# Patient Record
Sex: Female | Born: 1985 | Race: Black or African American | Hispanic: No | Marital: Single | Smoking: Current some day smoker
Health system: Southern US, Community
[De-identification: ages and names within clinical notes are randomized; demographics above are authoritative.]

## PROBLEM LIST (undated history)

## (undated) ENCOUNTER — Inpatient Hospital Stay (HOSPITAL_COMMUNITY): Payer: Self-pay

## (undated) DIAGNOSIS — D649 Anemia, unspecified: Secondary | ICD-10-CM

## (undated) DIAGNOSIS — O139 Gestational [pregnancy-induced] hypertension without significant proteinuria, unspecified trimester: Secondary | ICD-10-CM

## (undated) DIAGNOSIS — S93326A Dislocation of tarsometatarsal joint of unspecified foot, initial encounter: Secondary | ICD-10-CM

## (undated) DIAGNOSIS — G4730 Sleep apnea, unspecified: Secondary | ICD-10-CM

## (undated) DIAGNOSIS — I10 Essential (primary) hypertension: Secondary | ICD-10-CM

---
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
## 2007-03-15 ENCOUNTER — Emergency Department (HOSPITAL_COMMUNITY): Admission: EM | Admit: 2007-03-15 | Discharge: 2007-03-15 | Payer: Self-pay | Admitting: Emergency Medicine

## 2008-01-22 ENCOUNTER — Emergency Department (HOSPITAL_COMMUNITY): Admission: EM | Admit: 2008-01-22 | Discharge: 2008-01-22 | Payer: Self-pay | Admitting: Family Medicine

## 2008-05-12 ENCOUNTER — Inpatient Hospital Stay (HOSPITAL_COMMUNITY): Admission: AD | Admit: 2008-05-12 | Discharge: 2008-05-12 | Payer: Self-pay | Admitting: Obstetrics & Gynecology

## 2008-08-14 ENCOUNTER — Ambulatory Visit (HOSPITAL_COMMUNITY): Admission: RE | Admit: 2008-08-14 | Discharge: 2008-08-14 | Payer: Self-pay | Admitting: Obstetrics

## 2009-01-13 ENCOUNTER — Inpatient Hospital Stay (HOSPITAL_COMMUNITY): Admission: AD | Admit: 2009-01-13 | Discharge: 2009-01-16 | Payer: Self-pay | Admitting: Obstetrics

## 2010-11-02 ENCOUNTER — Emergency Department (HOSPITAL_COMMUNITY)
Admission: EM | Admit: 2010-11-02 | Discharge: 2010-11-02 | Payer: Self-pay | Source: Home / Self Care | Admitting: Family Medicine

## 2010-12-09 ENCOUNTER — Encounter: Payer: Self-pay | Admitting: Obstetrics & Gynecology

## 2011-02-27 LAB — CBC
HCT: 28.8 % — ABNORMAL LOW (ref 36.0–46.0)
Hemoglobin: 9.5 g/dL — ABNORMAL LOW (ref 12.0–15.0)
MCHC: 33 g/dL (ref 30.0–36.0)
MCV: 98.8 fL (ref 78.0–100.0)
RBC: 2.91 MIL/uL — ABNORMAL LOW (ref 3.87–5.11)
RDW: 13 % (ref 11.5–15.5)

## 2011-03-04 LAB — CBC
MCHC: 34.1 g/dL (ref 30.0–36.0)
MCV: 96.5 fL (ref 78.0–100.0)
Platelets: 197 10*3/uL (ref 150–400)
RDW: 13 % (ref 11.5–15.5)

## 2011-03-04 LAB — RPR: RPR Ser Ql: NONREACTIVE

## 2011-08-14 LAB — HCG, QUANTITATIVE, PREGNANCY: hCG, Beta Chain, Quant, S: 5242 — ABNORMAL HIGH

## 2011-08-14 LAB — URINALYSIS, ROUTINE W REFLEX MICROSCOPIC
Bilirubin Urine: NEGATIVE
Glucose, UA: NEGATIVE
Hgb urine dipstick: NEGATIVE
Protein, ur: NEGATIVE
Urobilinogen, UA: 1

## 2011-08-14 LAB — CBC
HCT: 36.1
MCV: 92.1
Platelets: 288
RBC: 3.91
WBC: 11.6 — ABNORMAL HIGH

## 2011-08-14 LAB — WET PREP, GENITAL
Clue Cells Wet Prep HPF POC: NONE SEEN
Trich, Wet Prep: NONE SEEN
Yeast Wet Prep HPF POC: NONE SEEN

## 2011-08-14 LAB — GC/CHLAMYDIA PROBE AMP, GENITAL
Chlamydia, DNA Probe: NEGATIVE
GC Probe Amp, Genital: NEGATIVE

## 2011-08-14 LAB — URINE MICROSCOPIC-ADD ON

## 2011-08-14 LAB — POCT PREGNANCY, URINE: Preg Test, Ur: POSITIVE

## 2011-12-27 ENCOUNTER — Emergency Department (INDEPENDENT_AMBULATORY_CARE_PROVIDER_SITE_OTHER)
Admission: EM | Admit: 2011-12-27 | Discharge: 2011-12-27 | Disposition: A | Discharge status: Home / Self Care | Payer: Medicaid Other | Source: Home / Self Care | Attending: Emergency Medicine | Admitting: Emergency Medicine

## 2011-12-27 ENCOUNTER — Encounter (HOSPITAL_COMMUNITY): Payer: Self-pay | Admitting: *Deleted

## 2011-12-27 DIAGNOSIS — N390 Urinary tract infection, site not specified: Secondary | ICD-10-CM

## 2011-12-27 DIAGNOSIS — M549 Dorsalgia, unspecified: Secondary | ICD-10-CM

## 2011-12-27 LAB — POCT URINALYSIS DIP (DEVICE)
Ketones, ur: NEGATIVE mg/dL
Protein, ur: NEGATIVE mg/dL
Specific Gravity, Urine: 1.02 (ref 1.005–1.030)
Urobilinogen, UA: 0.2 mg/dL (ref 0.0–1.0)
pH: 7 (ref 5.0–8.0)

## 2011-12-27 MED ORDER — METHOCARBAMOL 500 MG PO TABS: 500.0000 mg | ORAL_TABLET | Freq: Four times a day (QID) | ORAL | Status: AC

## 2011-12-27 MED ORDER — SULFAMETHOXAZOLE-TRIMETHOPRIM 800-160 MG PO TABS: 1.0000 | ORAL_TABLET | Freq: Two times a day (BID) | ORAL | Status: AC

## 2011-12-27 MED ORDER — METHOCARBAMOL 500 MG PO TABS: 500.0000 mg | ORAL_TABLET | Freq: Two times a day (BID) | ORAL | Status: AC

## 2011-12-27 MED ORDER — IBUPROFEN 800 MG PO TABS: 800.0000 mg | ORAL_TABLET | Freq: Three times a day (TID) | ORAL | Status: AC | PRN

## 2011-12-27 NOTE — ED Notes (Signed)
jpt with onset of low back pain x one week - worse with movement - no comfortable position not sitting or standing - radiates across lower back - per pt last menstrual cycle oct 2012 - not sexually active since birth daughter 3 years ago

## 2011-12-27 NOTE — ED Provider Notes (Signed)
History     CSN: [id number]  Arrival date & time 12/27/11  1042   First MD Initiated Contact with Patient 12/27/11 1216      Chief Complaint  Patient presents with  . Back Pain    (Consider location/radiation/quality/duration/timing/severity/associated sxs/prior treatment) HPI Comments: Patient reports bilateral lower thoracic and lumbar pain right worse than left, starting  5 days ago. Patient states pain was  initially waxing and waning, and is now constant. Patient states she's unable to find any comfortable position.pain is worse with bending forward, going from lying to sitting, and torso rotation. New recent or remote history of trauma to the back. Does not recall any recent change in physical activity. No urinary urgency, frequency, hematuria, abdominal pain, vaginal complaints. No rash. No chest pain coughing wheezing, shortness of breath. Last bowel movement today, was WNL for patient. Is taking 800 mg of ibuprofen without relief. Last dose approximately 4 hours ago. No personal or family history of kidney stones. No history of cancer, prolonged steroid use, IVDU.   ROS as noted in HPI. All other ROS negative.   Patient is a 26 y.o. female presenting with back pain. The history is provided by the patient. No language interpreter was used.  Back Pain  This is a new problem. The current episode started more than 2 days ago. The problem occurs constantly. The problem has been gradually worsening. The pain is associated with no known injury. The pain is present in the thoracic spine and lumbar spine. The quality of the pain is described as aching. The pain does not radiate. The symptoms are aggravated by bending, twisting and certain positions. The pain is the same all the time. Pertinent negatives include no chest pain, no fever, no numbness, no weight loss, no abdominal pain, no bowel incontinence, no perianal numbness, no bladder incontinence, no dysuria, no pelvic pain, no leg pain, no  paresthesias, no paresis and no weakness. She has tried NSAIDs for the symptoms. The treatment provided no relief. Risk factors include obesity.    History reviewed. No pertinent past medical history.  History reviewed. No pertinent past surgical history.  Family History  Problem Relation Age of Onset  . Hypertension Mother   . Stroke Mother   . Diabetes Other     History  Substance Use Topics  . Smoking status: Never Smoker   . Smokeless tobacco: Not on file  . Alcohol Use: Yes    OB History    Grav Para Term Preterm Abortions TAB SAB Ect Mult Living                  Review of Systems  Constitutional: Negative for fever and weight loss.  Cardiovascular: Negative for chest pain.  Gastrointestinal: Negative for abdominal pain and bowel incontinence.  Genitourinary: Negative for bladder incontinence, dysuria and pelvic pain.  Musculoskeletal: Positive for back pain.  Neurological: Negative for weakness, numbness and paresthesias.    Allergies  Review of patient's allergies indicates no known allergies.  Home Medications   Current Outpatient Rx  Name Route Sig Dispense Refill  . IBUPROFEN 800 MG PO TABS Oral Take 800 mg by mouth every 8 (eight) hours as needed.    . IBUPROFEN 800 MG PO TABS Oral Take 1 tablet (800 mg total) by mouth every 8 (eight) hours as needed for pain. 30 tablet 0  . METHOCARBAMOL 500 MG PO TABS Oral Take 1 tablet (500 mg total) by mouth 2 (two) times daily. 20 tablet 0  .  METHOCARBAMOL 500 MG PO TABS Oral Take 1 tablet (500 mg total) by mouth 4 (four) times daily. 40 tablet 0  . SULFAMETHOXAZOLE-TRIMETHOPRIM 800-160 MG PO TABS Oral Take 1 tablet by mouth 2 (two) times daily. 6 tablet 0    BP 128/85  Pulse 75  Temp(Src) 97.9 F (36.6 C) (Oral)  Resp 19  SpO2 97%  LMP 09/03/2011  Physical Exam  Nursing note and vitals reviewed. Constitutional: She is oriented to person, place, and time. She appears well-developed and well-nourished.    HENT:  Head: Normocephalic and atraumatic.  Eyes: Conjunctivae and EOM are normal.  Neck: Normal range of motion.  Cardiovascular: Normal rate, regular rhythm, normal heart sounds and intact distal pulses.   Pulmonary/Chest: Effort normal and breath sounds normal. She exhibits no tenderness.  Abdominal: Soft. Normal appearance and bowel sounds are normal. She exhibits no distension. There is no tenderness. There is no rebound and no guarding.  Musculoskeletal: Normal range of motion. She exhibits no edema and no tenderness.       Thoracic back: She exhibits tenderness.       Back:       Bilateral lower extremities nontender,  intact  PT pulses, No pain with int/ext rotation hips bilaterally. SLR neg bilaterally. Sensation baseline light touch bilaterally for Pt, DTR's symmetric and intact bilaterally KJ. Pain aggravated with hip flexion, leg extension. Motor symmetric bilateral 5/5 hip flexion, quadriceps, hamstrings, EHL, foot dorsiflexion, foot plantarflexion, gait somewhat antalgic but without apparent new ataxia.   Neurological: She is alert and oriented to person, place, and time.  Skin: Skin is warm and dry. No rash noted.  Psychiatric: She has a normal mood and affect. Her behavior is normal. Judgment and thought content normal.    ED Course  Procedures (including critical care time)  Labs Reviewed  POCT URINALYSIS DIP (DEVICE) - Abnormal; Notable for the following:    Hgb urine dipstick SMALL (*)    Leukocytes, UA SMALL (*) Biochemical Testing Only. Please order routine urinalysis from main lab if confirmatory testing is needed.   All other components within normal limits  POCT PREGNANCY, URINE   No results found.   1. Back pain   2. UTI (lower urinary tract infection)       MDM  Previous records reviewed, noncontributory. Checking UA to rule out UTI, nephrolithiasis. No bony tenderness, history of fevers. Lungs clear, satting well on room air, doubt pulmonary process  causing symptoms. Abdomen soft, benign. Doubt intra-abdominal cause  of symptoms.  Discussed results with patient. Will treat this as a UTI and back spasm. Will have her followup with her doctor in several days for a repeat UA. Patient understands when to return the ED.  Luiz Blare, MD 12/27/11 2117

## 2012-03-09 ENCOUNTER — Encounter (HOSPITAL_COMMUNITY): Payer: Self-pay | Admitting: *Deleted

## 2012-03-09 ENCOUNTER — Emergency Department (HOSPITAL_COMMUNITY): Payer: Medicaid Other

## 2012-03-09 ENCOUNTER — Emergency Department (HOSPITAL_COMMUNITY)
Admission: EM | Admit: 2012-03-09 | Discharge: 2012-03-09 | Disposition: A | Discharge status: Home / Self Care | Payer: Medicaid Other | Attending: Emergency Medicine | Admitting: Emergency Medicine

## 2012-03-09 DIAGNOSIS — K8020 Calculus of gallbladder without cholecystitis without obstruction: Secondary | ICD-10-CM | POA: Insufficient documentation

## 2012-03-09 DIAGNOSIS — J45909 Unspecified asthma, uncomplicated: Secondary | ICD-10-CM | POA: Insufficient documentation

## 2012-03-09 DIAGNOSIS — R109 Unspecified abdominal pain: Secondary | ICD-10-CM | POA: Insufficient documentation

## 2012-03-09 DIAGNOSIS — R112 Nausea with vomiting, unspecified: Secondary | ICD-10-CM | POA: Insufficient documentation

## 2012-03-09 HISTORY — DX: Anemia, unspecified: D64.9

## 2012-03-09 LAB — COMPREHENSIVE METABOLIC PANEL
ALT: 10 U/L (ref 0–35)
Alkaline Phosphatase: 104 U/L (ref 39–117)
BUN: 9 mg/dL (ref 6–23)
CO2: 25 mEq/L (ref 19–32)
Chloride: 106 mEq/L (ref 96–112)
GFR calc Af Amer: >90 mL/min (ref >90)
GFR calc non Af Amer: >90 mL/min (ref >90)
Glucose, Bld: 100 mg/dL — ABNORMAL HIGH (ref 70–99)
Potassium: 3.8 mEq/L (ref 3.5–5.1)
Sodium: 139 mEq/L (ref 135–145)
Total Bilirubin: 0.4 mg/dL (ref 0.3–1.2)

## 2012-03-09 LAB — DIFFERENTIAL
Eosinophils Absolute: 0.6 10*3/uL (ref 0.0–0.7)
Lymphocytes Relative: 21 % (ref 12–46)
Lymphs Abs: 1.7 10*3/uL (ref 0.7–4.0)
Monocytes Relative: 7 % (ref 3–12)
Neutrophils Relative %: 64 % (ref 43–77)

## 2012-03-09 LAB — URINALYSIS, ROUTINE W REFLEX MICROSCOPIC
Bilirubin Urine: NEGATIVE
Ketones, ur: NEGATIVE mg/dL
Nitrite: NEGATIVE
Protein, ur: NEGATIVE mg/dL
Specific Gravity, Urine: 1.01 (ref 1.005–1.030)
Urobilinogen, UA: 0.2 mg/dL (ref 0.0–1.0)

## 2012-03-09 LAB — CBC
HCT: 34.2 % — ABNORMAL LOW (ref 36.0–46.0)
Hemoglobin: 11.3 g/dL — ABNORMAL LOW (ref 12.0–15.0)
MCH: 28.4 pg (ref 26.0–34.0)
MCHC: 33 g/dL (ref 30.0–36.0)
RDW: 12.8 % (ref 11.5–15.5)

## 2012-03-09 LAB — URINE MICROSCOPIC-ADD ON

## 2012-03-09 LAB — LIPASE, BLOOD: Lipase: 13 U/L (ref 11–59)

## 2012-03-09 MED ORDER — ONDANSETRON 8 MG PO TBDP: 8.0000 mg | ORAL_TABLET | Freq: Three times a day (TID) | ORAL | Status: AC | PRN

## 2012-03-09 MED ORDER — HYDROCODONE-ACETAMINOPHEN 5-500 MG PO TABS: 1.0000 | ORAL_TABLET | Freq: Four times a day (QID) | ORAL | Status: AC | PRN

## 2012-03-09 MED ORDER — SODIUM CHLORIDE 0.9 % IV BOLUS (SEPSIS)
1000.0000 mL | Freq: Once | INTRAVENOUS | Status: AC
  Administered 2012-03-09: 1000 mL via INTRAVENOUS

## 2012-03-09 MED ORDER — ONDANSETRON HCL 4 MG/2ML IJ SOLN
4.0000 mg | Freq: Once | INTRAMUSCULAR | Status: AC
  Administered 2012-03-09: 4 mg via INTRAVENOUS
  Filled 2012-03-09: qty 2

## 2012-03-09 MED ORDER — MORPHINE SULFATE 4 MG/ML IJ SOLN
6.0000 mg | Freq: Once | INTRAMUSCULAR | Status: AC
  Administered 2012-03-09: 6 mg via INTRAVENOUS
  Filled 2012-03-09: qty 2

## 2012-03-09 NOTE — ED Notes (Signed)
Patient reports she has had abd pain intermittently for 3 years.  She states she has n/v with the pain.  She states it feels like she has a hole in her stomach.  Patient states she woke with worse sx today

## 2012-03-09 NOTE — Discharge Instructions (Signed)
Your ultrasound showed gallstones in your gallbladder. Avoid fatty foods. Take vicodin as prescribed as needed for severe pain. Take zofran for nausea. Follow up with [facility]. Call them, explain you were here, and you need an appointment as soon as possible if your symptoms continue. Return if worsening.    Cholelithiasis Cholelithiasis (also called gallstones) is a form of gallbladder disease where gallstones form in your gallbladder. The gallbladder is a non-essential organ that stores bile made in the liver, which helps digest fats. Gallstones begin as small crystals and slowly grow into stones. Gallstone pain occurs when the gallbladder spasms, and a gallstone is blocking the duct. Pain can also occur when a stone passes out of the duct.  Women are more likely to develop gallstones than men. Other factors that increase the risk of gallbladder disease are:  Having multiple pregnancies. Physicians sometimes advise removing diseased gallbladders before future pregnancies.   Obesity.   Diets heavy in fried foods and fat.   Increasing age (older than 8).   Prolonged use of medications containing female hormones.   Diabetes mellitus.   Rapid weight loss.   Family history of gallstones (heredity).  SYMPTOMS  Feeling sick to your stomach (nauseous).   Abdominal pain.   Yellowing of the skin (jaundice).   Sudden pain. It may persist from several minutes to several hours.   Worsening pain with deep breathing or when jarred.   Fever.   Tenderness to the touch.  In some cases, when gallstones do not move into the bile duct, people have no pain or symptoms. These are called "silent" gallstones. TREATMENT In severe cases, emergency surgery may be required. HOME CARE INSTRUCTIONS   Only take over-the-counter or prescription medicines for pain, discomfort, or fever as directed by your caregiver.   Follow a low-fat diet until seen again. Fat causes the gallbladder to  contract, which can result in pain.   Follow up as instructed. Attacks are almost always recurrent and surgery is usually required for permanent treatment.  SEEK IMMEDIATE MEDICAL CARE IF:   Your pain increases and is not controlled by medications.   You have an oral temperature above 102 F (38.9 C), not controlled by medication.   You develop nausea and vomiting.  MAKE SURE YOU:   Understand these instructions.   Will watch your condition.   Will get help right away if you are not doing well or get worse.  Document Released: 10/30/2005 Document Revised: 10/23/2011 Document Reviewed: 01/02/2011 [facility] Patient Information 2012 [place], [place].

## 2012-03-09 NOTE — ED Provider Notes (Signed)
History     CSN: [id number]  Arrival date & time 03/09/12  7829   First MD Initiated Contact with Patient 03/09/12 0935      Chief Complaint  Patient presents with  . Abdominal Pain    (Consider location/radiation/quality/duration/timing/severity/associated sxs/prior treatment) Patient is a 26 y.o. female presenting with abdominal pain. The history is provided by the patient.  Abdominal Pain The primary symptoms of the illness include abdominal pain, nausea and vomiting. The primary symptoms of the illness do not include fever, diarrhea, dysuria, vaginal discharge or vaginal bleeding. The current episode started more than 2 days ago. The problem has been gradually worsening.  The illness is associated with eating. The patient states that she believes she is currently not pregnant. The patient has not had a change in bowel habit. Symptoms associated with the illness do not include chills.  Pt states she has had intermittent abdominal pains for the last 3 years after she had a child. States they are now gradually worsened, and in the last two weeks have been more constant associated with nausea and vomiting. States she cannot keep anything down in the last few days, even water. Pt denies fever, chills, urinary symptoms, vaginal symptoms. States this time pain has been constant since 3am.  Past Medical History  Diagnosis Date  . Asthma   . Anemia     History reviewed. No pertinent past surgical history.  Family History  Problem Relation Age of Onset  . Hypertension Mother   . Stroke Mother   . Diabetes Other     History  Substance Use Topics  . Smoking status: Never Smoker   . Smokeless tobacco: Never Used  . Alcohol Use: Yes     socially    OB History    Grav Para Term Preterm Abortions TAB SAB Ect Mult Living                  Review of Systems  Constitutional: Negative for fever and chills.  HENT: Negative.   Respiratory: Negative.   Cardiovascular: Negative.     Gastrointestinal: Positive for nausea, vomiting and abdominal pain. Negative for diarrhea.  Genitourinary: Negative for dysuria, vaginal bleeding and vaginal discharge.  Musculoskeletal: Negative.   Skin: Negative.   Neurological: Negative for dizziness and weakness.    Allergies  Review of patient's allergies indicates no known allergies.  Home Medications   Current Outpatient Rx  Name Route Sig Dispense Refill  . CETIRIZINE HCL 10 MG PO TABS Oral Take 10 mg by mouth daily.      BP 115/77  Pulse 91  Temp(Src) 98.4 F (36.9 C) (Oral)  Resp 16  Ht 5\' 3"  (1.6 m)  Wt 222 lb (100.699 kg)  BMI 39.33 kg/m2  SpO2 98%  Physical Exam  Nursing note and vitals reviewed. Constitutional: She is oriented to person, place, and time. She appears well-developed and well-nourished. No distress.  HENT:  Head: Normocephalic.  Eyes: Conjunctivae are normal.  Neck: Neck supple.  Cardiovascular: Normal rate, regular rhythm and normal heart sounds.   Pulmonary/Chest: Effort normal and breath sounds normal. No respiratory distress.  Abdominal: Soft. Bowel sounds are normal. There is tenderness. There is no rebound and no guarding.       Epigastric and RUQ tenderness   Neurological: She is alert and oriented to person, place, and time.  Skin: Skin is warm and dry.  Psychiatric: She has a normal mood and affect.    ED Course  Procedures (including  critical care time)  Pt with RUQ and epigastirc pain on and off for about 3 years. She appears very uncomfortable right now. I have a high suspicion for possible cholelithiasis or cholecystitis based on exam and history. Will get labs, US abdomen.   Results for orders placed during the hospital encounter of 03/09/12  CBC      Component Value Range   WBC 8.1  4.0 - 10.5 (K/uL)   RBC 3.98  3.87 - 5.11 (MIL/uL)   Hemoglobin 11.3 (*) 12.0 - 15.0 (g/dL)   HCT 16.1 (*) 09.6 - 46.0 (%)   MCV 85.9  78.0 - 100.0 (fL)   MCH 28.4  26.0 - 34.0 (pg)    MCHC 33.0  30.0 - 36.0 (g/dL)   RDW 04.5  40.9 - 81.1 (%)   Platelets 279  150 - 400 (K/uL)  DIFFERENTIAL      Component Value Range   Neutrophils Relative 64  43 - 77 (%)   Neutro Abs 5.2  1.7 - 7.7 (K/uL)   Lymphocytes Relative 21  12 - 46 (%)   Lymphs Abs 1.7  0.7 - 4.0 (K/uL)   Monocytes Relative 7  3 - 12 (%)   Monocytes Absolute 0.6  0.1 - 1.0 (K/uL)   Eosinophils Relative 7 (*) 0 - 5 (%)   Eosinophils Absolute 0.6  0.0 - 0.7 (K/uL)   Basophils Relative 1  0 - 1 (%)   Basophils Absolute 0.1  0.0 - 0.1 (K/uL)  COMPREHENSIVE METABOLIC PANEL      Component Value Range   Sodium 139  135 - 145 (mEq/L)   Potassium 3.8  3.5 - 5.1 (mEq/L)   Chloride 106  96 - 112 (mEq/L)   CO2 25  19 - 32 (mEq/L)   Glucose, Bld 100 (*) 70 - 99 (mg/dL)   BUN 9  6 - 23 (mg/dL)   Creatinine, Ser 9.14  0.50 - 1.10 (mg/dL)   Calcium 9.1  8.4 - 78.2 (mg/dL)   Total Protein 7.0  6.0 - 8.3 (g/dL)   Albumin 3.7  3.5 - 5.2 (g/dL)   AST 13  0 - 37 (U/L)   ALT 10  0 - 35 (U/L)   Alkaline Phosphatase 104  39 - 117 (U/L)   Total Bilirubin 0.4  0.3 - 1.2 (mg/dL)   GFR calc non Af Amer >90  >90 (mL/min)   GFR calc Af Amer >90  >90 (mL/min)  LIPASE, BLOOD      Component Value Range   Lipase 13  11 - 59 (U/L)  URINALYSIS, ROUTINE W REFLEX MICROSCOPIC      Component Value Range   Color, Urine YELLOW  YELLOW    APPearance CLEAR  CLEAR    Specific Gravity, Urine 1.010  1.005 - 1.030    pH 6.5  5.0 - 8.0    Glucose, UA NEGATIVE  NEGATIVE (mg/dL)   Hgb urine dipstick SMALL (*) NEGATIVE    Bilirubin Urine NEGATIVE  NEGATIVE    Ketones, ur NEGATIVE  NEGATIVE (mg/dL)   Protein, ur NEGATIVE  NEGATIVE (mg/dL)   Urobilinogen, UA 0.2  0.0 - 1.0 (mg/dL)   Nitrite NEGATIVE  NEGATIVE    Leukocytes, UA TRACE (*) NEGATIVE   PREGNANCY, URINE      Component Value Range   Preg Test, Ur NEGATIVE  NEGATIVE   URINE MICROSCOPIC-ADD ON      Component Value Range   Squamous Epithelial / LPF RARE  RARE  WBC, UA 3-6  <3  (WBC/hpf)   RBC / HPF 0-2  <3 (RBC/hpf)   Bacteria, UA RARE  RARE    Urine-Other MUCOUS PRESENT     US Abdomen Complete  03/09/2012  *RADIOLOGY REPORT*  Clinical Data:  Abdominal pain  COMPLETE ABDOMINAL ULTRASOUND  Comparison:  None.  Findings:  Gallbladder:  Multiple shadowing gallstones are seen within an otherwise normal-appearing gallbladder. A shadowing gallstone is also seen within the gallbladder neck.  No gallbladder wall thickening or pericholecystic fluid.  Negative sonographic Murphy's sign.  Common bile duct:  Normal in size measuring 2.1 mm in diameter.  Liver:  Homogeneous hepatic echotexture.  No discrete hepatic lesions.  No definite intrahepatic biliary ductal dilatation.  No ascites.  IVC:  Appears normal.  Pancreas:  Limited visualization of the pancreatic head and neck is normal.  Visualization of the pancreatic body and tail is obscured by bowel gas.  Spleen:  Normal in size measuring 8 cm in length.  Right Kidney:  Normal cortical thickness, echogenicity and size, measuring 10.5 cm in length.  No focal renal lesions.  No echogenic renal stones.  No urinary obstruction.  Left Kidney:  Normal cortical thickness, echogenicity and size, measuring 11.9 cm in length.  No focal renal lesions.  No echogenic renal stones.  No urinary obstruction.  Abdominal aorta:  No aneurysm identified.  IMPRESSION: Multiple shadowing gallstones including a gallstone within the gallbladder neck, are seen within an otherwise normal-appearing gallbladder. These findings remain indeterminate for acute cholecystitis and further evaluation may be performed with a HIDA scan as clinically indicated.  Original Report Authenticated By: Waynard Reeds, M.D.   12:01 PM Pt is pain free, nausea resolved. US shows multiple gallstone including one within gallbladder neck. Pt's LFTs and lipase both negative, no elevated WBC. She is comfortable. Will d/c home with close surgery outpatient follow up. Instructed tto return if  fever, increased pain, persistent nausea and vomiting.   1. Cholelithiasis       MDM          Lottie Mussel, PA 03/09/12 1256

## 2012-03-12 NOTE — ED Provider Notes (Signed)
Medical screening examination/treatment/procedure(s) were performed by non-physician practitioner and as supervising physician I was immediately available for consultation/collaboration.  Bonnee Zertuche T Samuella Rasool, MD 03/12/12 1800 

## 2012-03-19 ENCOUNTER — Encounter (INDEPENDENT_AMBULATORY_CARE_PROVIDER_SITE_OTHER): Payer: Self-pay

## 2012-03-23 ENCOUNTER — Encounter (INDEPENDENT_AMBULATORY_CARE_PROVIDER_SITE_OTHER): Payer: Self-pay | Admitting: General Surgery

## 2012-03-23 ENCOUNTER — Ambulatory Visit (INDEPENDENT_AMBULATORY_CARE_PROVIDER_SITE_OTHER): Payer: Medicaid Other | Admitting: General Surgery

## 2012-03-23 VITALS — BP 110/80 | HR 86 | Temp 97.8°F | Ht 64.5 in | Wt 217.8 lb

## 2012-03-23 DIAGNOSIS — K8020 Calculus of gallbladder without cholecystitis without obstruction: Secondary | ICD-10-CM

## 2012-03-23 NOTE — Patient Instructions (Signed)
Strict lowfat diet. 

## 2012-03-23 NOTE — Progress Notes (Signed)
Patient ID: Shirley Terrell, female   DOB: 04/10/1986, 26 y.o.   MRN: [id number]  Chief Complaint  Patient presents with  . Pre-op Exam    eval gallbladder with stones    HPI Shirley Terrell is a 26 y.o. female.   HPI  She was referred by Dr. Williams for evaluation of epigastric pain and gallstones.  For about 3 years she's been having intermittent episodes of epigastric discomfort and some nausea.  Recently, her symptoms have progressed and included vomiting.  She was evaluated in the emergency department. An ultrasound demonstrated multiple gallstones with a normal common bile duct diameter. There is no evidence of cholecystitis. Liver function tests were normal. White blood cell count and lipase are normal. She has been referred here for further evaluation and treatment of her gallbladder disease.    Past Medical History  Diagnosis Date  . Asthma   . Anemia   . Gallstones     History reviewed. No pertinent past surgical history.  Family History  Problem Relation Age of Onset  . Hypertension Mother   . Stroke Mother   . Diabetes Other   . Heart disease Father   . Depression Father   . Cancer Paternal Grandmother     breast    Social History History  Substance Use Topics  . Smoking status: Never Smoker   . Smokeless tobacco: Never Used  . Alcohol Use: Yes     socially    No Known Allergies  Current Outpatient Prescriptions  Medication Sig Dispense Refill  . cetirizine (ZYRTEC) 10 MG tablet Take 10 mg by mouth daily.      . HYDROcodone-acetaminophen (VICODIN) 5-500 MG per tablet Take 1 tablet by mouth every 6 (six) hours as needed.      . ondansetron (ZOFRAN) 8 MG tablet Take 8 mg by mouth every 8 (eight) hours as needed.        Review of Systems Review of Systems  Constitutional: Negative.   Respiratory: Negative.   Cardiovascular: Negative.   Gastrointestinal: Positive for nausea, vomiting, abdominal pain, constipation and abdominal distention.    Genitourinary: Negative.   Hematological:       Anemia    Blood pressure 110/80, pulse 86, temperature 97.8 F (36.6 C), temperature source Temporal, height 5' 4.5" (1.638 m), weight 217 lb 12.8 oz (98.793 kg), SpO2 98.00%.  Physical Exam Physical Exam  Constitutional:       Obese female in NAD.  Eyes: EOM are normal. No scleral icterus.  Neck: Neck supple.  Cardiovascular: Normal rate and regular rhythm.   Pulmonary/Chest: Effort normal and breath sounds normal.  Abdominal: Soft. She exhibits no distension and no mass. There is Tenderness: mild RUQ and epigastric tenderness..  Lymphadenopathy:    She has no cervical adenopathy.  Skin: Skin is warm and dry.    Data [facility]ed [facility]  Assessment    Symptomatic cholelithiasis     Plan    I recommended a strict low-fat diet. I also recommend a laparoscopic cholecystectomy.  I have explained the procedure, risks, and aftercare of cholecystectomy.  Risks include but are not limited to bleeding, infection, wound problems, anesthesia, diarrhea, bile leak, injury to common bile duct/liver/intestine.  She seems to understand and agrees to proceed.        Chassity Ludke J 03/23/2012, 5:03 PM    

## 2012-04-01 ENCOUNTER — Encounter (HOSPITAL_COMMUNITY)
Admission: RE | Admit: 2012-04-01 | Discharge: 2012-04-01 | Disposition: A | Discharge status: Home / Self Care | Payer: Medicaid Other | Source: Ambulatory Visit | Attending: General Surgery | Admitting: General Surgery

## 2012-04-01 ENCOUNTER — Encounter (HOSPITAL_COMMUNITY): Payer: Self-pay

## 2012-04-01 LAB — CBC
Platelets: 304 10*3/uL (ref 150–400)
RDW: 12.7 % (ref 11.5–15.5)
WBC: 8.2 10*3/uL (ref 4.0–10.5)

## 2012-04-01 LAB — COMPREHENSIVE METABOLIC PANEL
ALT: 9 U/L (ref 0–35)
AST: 13 U/L (ref 0–37)
Albumin: 4 g/dL (ref 3.5–5.2)
Alkaline Phosphatase: 85 U/L (ref 39–117)
Chloride: 103 mEq/L (ref 96–112)
Potassium: 4.3 mEq/L (ref 3.5–5.1)
Total Bilirubin: 0.5 mg/dL (ref 0.3–1.2)

## 2012-04-01 LAB — SURGICAL PCR SCREEN: MRSA, PCR: NEGATIVE

## 2012-04-01 LAB — PROTIME-INR: INR: 0.96 (ref 0.00–1.49)

## 2012-04-01 NOTE — Progress Notes (Signed)
Has irregular menses

## 2012-04-01 NOTE — Pre-Procedure Instructions (Addendum)
22 CHIMAMANDA SIEGFRIED  04/01/2012   Your procedure is scheduled on:  04/13/12  Report to [facility] at 730 AM.  Call this number if you have problems the morning of surgery: [phone]   Remember:   Do not eat food:After Midnight.  May have clear liquids: up to 4 Hours before arrival 130 am.  Clear liquids include soda, tea, black coffee, apple or grape juice, broth.  Take these medicines the morning of surgery with A SIP OF WATER: pain med, zyrtec, zofran if needed STOP any aspirin, nsaids, herbal meds, blood thinners   Do not wear jewelry, make-up or nail polish.  Do not wear lotions, powders, or perfumes. You may wear deodorant.  Do not shave 48 hours prior to surgery. Men may shave face and neck.  Do not bring valuables to the hospital.  Contacts, dentures or bridgework may not be worn into surgery.  Leave suitcase in the car. After surgery it may be brought to your room.  For patients admitted to the hospital, checkout time is 11:00 AM the day of discharge.   Patients discharged the day of surgery will not be allowed to drive home.  Name and phone number of your [phone] mom  Special Instructions: CHG Shower Use Special Wash: 1/2 bottle night before surgery and 1/2 bottle morning of surgery.   Please read over the following fact sheets that you were given: Pain Booklet, Coughing and Deep Breathing, MRSA Information and Surgical Site Infection Prevention

## 2012-04-07 ENCOUNTER — Encounter (HOSPITAL_COMMUNITY): Payer: Self-pay | Admitting: Pharmacy Technician

## 2012-04-12 MED ORDER — CEFAZOLIN SODIUM-DEXTROSE 2-3 GM-% IV SOLR
2.0000 g | INTRAVENOUS | Status: AC
  Administered 2012-04-13: 2 g via INTRAVENOUS
  Filled 2012-04-12: qty 50

## 2012-04-13 ENCOUNTER — Encounter (HOSPITAL_COMMUNITY)
Admission: RE | Disposition: A | Discharge status: Home / Self Care | Payer: Self-pay | Source: Ambulatory Visit | Attending: General Surgery

## 2012-04-13 ENCOUNTER — Encounter (HOSPITAL_COMMUNITY): Payer: Self-pay | Admitting: Anesthesiology

## 2012-04-13 ENCOUNTER — Ambulatory Visit (HOSPITAL_COMMUNITY)
Admission: RE | Admit: 2012-04-13 | Discharge: 2012-04-13 | Disposition: A | Discharge status: Home / Self Care | Payer: Medicaid Other | Source: Ambulatory Visit | Attending: General Surgery | Admitting: General Surgery

## 2012-04-13 ENCOUNTER — Ambulatory Visit (HOSPITAL_COMMUNITY): Payer: Medicaid Other | Admitting: Anesthesiology

## 2012-04-13 ENCOUNTER — Ambulatory Visit (HOSPITAL_COMMUNITY): Payer: Medicaid Other

## 2012-04-13 DIAGNOSIS — J45909 Unspecified asthma, uncomplicated: Secondary | ICD-10-CM | POA: Insufficient documentation

## 2012-04-13 DIAGNOSIS — K8010 Calculus of gallbladder with chronic cholecystitis without obstruction: Secondary | ICD-10-CM

## 2012-04-13 DIAGNOSIS — K8020 Calculus of gallbladder without cholecystitis without obstruction: Secondary | ICD-10-CM | POA: Insufficient documentation

## 2012-04-13 DIAGNOSIS — Z01812 Encounter for preprocedural laboratory examination: Secondary | ICD-10-CM | POA: Insufficient documentation

## 2012-04-13 HISTORY — PX: CHOLECYSTECTOMY: SHX55

## 2012-04-13 SURGERY — LAPAROSCOPIC CHOLECYSTECTOMY WITH INTRAOPERATIVE CHOLANGIOGRAM
Anesthesia: General | Site: Abdomen | Wound class: Clean Contaminated

## 2012-04-13 MED ORDER — FENTANYL CITRATE 0.05 MG/ML IJ SOLN
INTRAMUSCULAR | Status: DC | PRN
  Administered 2012-04-13: 100 ug via INTRAVENOUS
  Administered 2012-04-13: 250 ug via INTRAVENOUS

## 2012-04-13 MED ORDER — ACETAMINOPHEN 10 MG/ML IV SOLN
INTRAVENOUS | Status: DC | PRN
  Administered 2012-04-13: 1000 mg via INTRAVENOUS

## 2012-04-13 MED ORDER — SODIUM CHLORIDE 0.9 % IR SOLN
Status: DC | PRN
  Administered 2012-04-13: 1000 mL

## 2012-04-13 MED ORDER — ACETAMINOPHEN 10 MG/ML IV SOLN
INTRAVENOUS | Status: AC
  Filled 2012-04-13: qty 100

## 2012-04-13 MED ORDER — GLYCOPYRROLATE 0.2 MG/ML IJ SOLN
INTRAMUSCULAR | Status: DC | PRN
  Administered 2012-04-13: .4 mg via INTRAVENOUS

## 2012-04-13 MED ORDER — HYDROMORPHONE HCL PF 1 MG/ML IJ SOLN
0.2500 mg | INTRAMUSCULAR | Status: DC | PRN
  Administered 2012-04-13: 0.5 mg via INTRAVENOUS

## 2012-04-13 MED ORDER — 0.9 % SODIUM CHLORIDE (POUR BTL) OPTIME
TOPICAL | Status: DC | PRN
  Administered 2012-04-13: 1000 mL

## 2012-04-13 MED ORDER — DEXAMETHASONE SODIUM PHOSPHATE 4 MG/ML IJ SOLN
INTRAMUSCULAR | Status: DC | PRN
  Administered 2012-04-13: 4 mg via INTRAVENOUS

## 2012-04-13 MED ORDER — LACTATED RINGERS IV SOLN
INTRAVENOUS | Status: DC
  Administered 2012-04-13: 09:00:00 via INTRAVENOUS
  Administered 2012-04-13: 1000 mL via INTRAVENOUS
  Administered 2012-04-13: 10:00:00 via INTRAVENOUS

## 2012-04-13 MED ORDER — OXYCODONE-ACETAMINOPHEN 5-325 MG PO TABS: 1.0000 | ORAL_TABLET | ORAL | Status: AC | PRN

## 2012-04-13 MED ORDER — PROPOFOL 10 MG/ML IV BOLUS
INTRAVENOUS | Status: DC | PRN
  Administered 2012-04-13: 200 mg via INTRAVENOUS

## 2012-04-13 MED ORDER — SODIUM CHLORIDE 0.9 % IV SOLN
INTRAVENOUS | Status: DC | PRN
  Administered 2012-04-13: 09:00:00

## 2012-04-13 MED ORDER — DROPERIDOL 2.5 MG/ML IJ SOLN
INTRAMUSCULAR | Status: DC | PRN
  Administered 2012-04-13: 0.625 mg via INTRAVENOUS

## 2012-04-13 MED ORDER — ONDANSETRON HCL 4 MG/2ML IJ SOLN
INTRAMUSCULAR | Status: DC | PRN
  Administered 2012-04-13: 4 mg via INTRAVENOUS

## 2012-04-13 MED ORDER — KETOROLAC TROMETHAMINE 30 MG/ML IJ SOLN
15.0000 mg | Freq: Once | INTRAMUSCULAR | Status: AC | PRN
  Administered 2012-04-13: 15 mg via INTRAVENOUS

## 2012-04-13 MED ORDER — VECURONIUM BROMIDE 10 MG IV SOLR
INTRAVENOUS | Status: DC | PRN
  Administered 2012-04-13: 5 mg via INTRAVENOUS

## 2012-04-13 MED ORDER — BUPIVACAINE-EPINEPHRINE 0.25% -1:200000 IJ SOLN
INTRAMUSCULAR | Status: DC | PRN
  Administered 2012-04-13: 16 mL

## 2012-04-13 MED ORDER — MIDAZOLAM HCL 5 MG/5ML IJ SOLN
INTRAMUSCULAR | Status: DC | PRN
  Administered 2012-04-13: 2 mg via INTRAVENOUS

## 2012-04-13 MED ORDER — NEOSTIGMINE METHYLSULFATE 1 MG/ML IJ SOLN
INTRAMUSCULAR | Status: DC | PRN
  Administered 2012-04-13: 3 mg via INTRAVENOUS

## 2012-04-13 MED ORDER — LIDOCAINE HCL (CARDIAC) 20 MG/ML IV SOLN
INTRAVENOUS | Status: DC | PRN
  Administered 2012-04-13: 100 mg via INTRAVENOUS

## 2012-04-13 MED ORDER — PROMETHAZINE HCL 25 MG/ML IJ SOLN: 6.2500 mg | INTRAMUSCULAR | Status: DC | PRN

## 2012-04-13 SURGICAL SUPPLY — 48 items
APL SKNCLS STERI-STRIP NONHPOA (GAUZE/BANDAGES/DRESSINGS) ×1
APPLIER CLIP 5 13 M/L LIGAMAX5 (MISCELLANEOUS) ×2
APR CLP MED LRG 5 ANG JAW (MISCELLANEOUS) ×1
BAG SPEC RTRVL LRG 6X4 10 (ENDOMECHANICALS) ×1
BENZOIN TINCTURE PRP APPL 2/3 (GAUZE/BANDAGES/DRESSINGS) ×2 IMPLANT
CANISTER SUCTION 2500CC (MISCELLANEOUS) ×2 IMPLANT
CHLORAPREP W/TINT 26ML (MISCELLANEOUS) ×2 IMPLANT
CLIP APPLIE 5 13 M/L LIGAMAX5 (MISCELLANEOUS) ×1 IMPLANT
CLOTH BEACON ORANGE TIMEOUT ST (SAFETY) ×2 IMPLANT
COVER MAYO STAND STRL (DRAPES) ×2 IMPLANT
COVER SURGICAL LIGHT HANDLE (MISCELLANEOUS) ×2 IMPLANT
DECANTER SPIKE VIAL GLASS SM (MISCELLANEOUS) ×4 IMPLANT
DRAPE C-ARM 42X72 X-RAY (DRAPES) ×2 IMPLANT
DRAPE UTILITY 15X26 W/TAPE STR (DRAPE) ×4 IMPLANT
ELECT REM PT RETURN 9FT ADLT (ELECTROSURGICAL) ×2
ELECTRODE REM PT RTRN 9FT ADLT (ELECTROSURGICAL) ×1 IMPLANT
GAUZE SPONGE 2X2 8PLY STRL LF (GAUZE/BANDAGES/DRESSINGS) ×1 IMPLANT
GLOVE BIO SURGEON STRL SZ7.5 (GLOVE) ×1 IMPLANT
GLOVE BIOGEL PI IND STRL 6.5 (GLOVE) IMPLANT
GLOVE BIOGEL PI IND STRL 7.5 (GLOVE) IMPLANT
GLOVE BIOGEL PI IND STRL 8 (GLOVE) ×1 IMPLANT
GLOVE BIOGEL PI INDICATOR 6.5 (GLOVE) ×3
GLOVE BIOGEL PI INDICATOR 7.5 (GLOVE) ×1
GLOVE BIOGEL PI INDICATOR 8 (GLOVE) ×2
GLOVE ECLIPSE 8.0 STRL XLNG CF (GLOVE) ×2 IMPLANT
GLOVE ORTHOPEDIC STR SZ6.5 (GLOVE) ×1 IMPLANT
GLOVE SS BIOGEL STRL SZ 7.5 (GLOVE) IMPLANT
GLOVE SUPERSENSE BIOGEL SZ 7.5 (GLOVE) ×1
GLOVE SURG SS PI 6.5 STRL IVOR (GLOVE) ×2 IMPLANT
GOWN STRL NON-REIN LRG LVL3 (GOWN DISPOSABLE) ×4 IMPLANT
KIT BASIN OR (CUSTOM PROCEDURE TRAY) ×2 IMPLANT
KIT ROOM TURNOVER OR (KITS) ×2 IMPLANT
NS IRRIG 1000ML POUR BTL (IV SOLUTION) ×2 IMPLANT
PAD ARMBOARD 7.5X6 YLW CONV (MISCELLANEOUS) ×2 IMPLANT
POUCH SPECIMEN RETRIEVAL 10MM (ENDOMECHANICALS) ×2 IMPLANT
SCISSORS LAP 5X35 DISP (ENDOMECHANICALS) IMPLANT
SET CHOLANGIOGRAPH 5 50 .035 (SET/KITS/TRAYS/PACK) ×2 IMPLANT
SET IRRIG TUBING LAPAROSCOPIC (IRRIGATION / IRRIGATOR) ×2 IMPLANT
SLEEVE ENDOPATH XCEL 5M (ENDOMECHANICALS) ×4 IMPLANT
SPECIMEN JAR SMALL (MISCELLANEOUS) ×2 IMPLANT
SPONGE GAUZE 2X2 STER 10/PKG (GAUZE/BANDAGES/DRESSINGS) ×1
SUT MON AB 4-0 PC3 18 (SUTURE) ×2 IMPLANT
TOWEL OR 17X24 6PK STRL BLUE (TOWEL DISPOSABLE) ×1 IMPLANT
TOWEL OR 17X26 10 PK STRL BLUE (TOWEL DISPOSABLE) ×1 IMPLANT
TRAY LAPAROSCOPIC (CUSTOM PROCEDURE TRAY) ×2 IMPLANT
TROCAR XCEL BLUNT TIP 100MML (ENDOMECHANICALS) ×2 IMPLANT
TROCAR XCEL NON-BLD 11X100MML (ENDOMECHANICALS) IMPLANT
TROCAR XCEL NON-BLD 5MMX100MML (ENDOMECHANICALS) ×2 IMPLANT

## 2012-04-13 NOTE — H&P (View-Only) (Signed)
Patient ID: Shirley Terrell, female   DOB: 05-29-1986, 26 y.o.   MRN: [id number]  Chief Complaint  Patient presents with  . Pre-op Exam    eval gallbladder with stones    HPI Shirley Terrell is a 26 y.o. female.   HPI  She was referred by Dr. Mayford Knife for evaluation of epigastric pain and gallstones.  For about 3 years she's been having intermittent episodes of epigastric discomfort and some nausea.  Recently, her symptoms have progressed and included vomiting.  She was evaluated in the emergency department. An ultrasound demonstrated multiple gallstones with a normal common bile duct diameter. There is no evidence of cholecystitis. Liver function tests were normal. White blood cell count and lipase are normal. She has been referred here for further evaluation and treatment of her gallbladder disease.    Past Medical History  Diagnosis Date  . Asthma   . Anemia   . Gallstones     History reviewed. No pertinent past surgical history.  Family History  Problem Relation Age of Onset  . Hypertension Mother   . Stroke Mother   . Diabetes Other   . Heart disease Father   . Depression Father   . Cancer Paternal Grandmother     breast    Social History History  Substance Use Topics  . Smoking status: Never Smoker   . Smokeless tobacco: Never Used  . Alcohol Use: Yes     socially    No Known Allergies  Current Outpatient Prescriptions  Medication Sig Dispense Refill  . cetirizine (ZYRTEC) 10 MG tablet Take 10 mg by mouth daily.      Marland Kitchen HYDROcodone-acetaminophen (VICODIN) 5-500 MG per tablet Take 1 tablet by mouth every 6 (six) hours as needed.      . ondansetron (ZOFRAN) 8 MG tablet Take 8 mg by mouth every 8 (eight) hours as needed.        Review of Systems Review of Systems  Constitutional: Negative.   Respiratory: Negative.   Cardiovascular: Negative.   Gastrointestinal: Positive for nausea, vomiting, abdominal pain, constipation and abdominal distention.    Genitourinary: Negative.   Hematological:       Anemia    Blood pressure 110/80, pulse 86, temperature 97.8 F (36.6 C), temperature source Temporal, height 5' 4.5" (1.638 m), weight 217 lb 12.8 oz (98.793 kg), SpO2 98.00%.  Physical Exam Physical Exam  Constitutional:       Obese female in NAD.  Eyes: EOM are normal. No scleral icterus.  Neck: Neck supple.  Cardiovascular: Normal rate and regular rhythm.   Pulmonary/Chest: Effort normal and breath sounds normal.  Abdominal: Soft. She exhibits no distension and no mass. There is Tenderness: mild RUQ and epigastric tenderness..  Lymphadenopathy:    She has no cervical adenopathy.  Skin: Skin is warm and dry.    Data Reviewed Notes from [facility] and ED  Assessment    Symptomatic cholelithiasis     Plan    I recommended a strict low-fat diet. I also recommend a laparoscopic cholecystectomy.  I have explained the procedure, risks, and aftercare of cholecystectomy.  Risks include but are not limited to bleeding, infection, wound problems, anesthesia, diarrhea, bile leak, injury to common bile duct/liver/intestine.  She seems to understand and agrees to proceed.        Koi Yarbro J 03/23/2012, 5:03 PM

## 2012-04-13 NOTE — Transfer of Care (Signed)
Immediate Anesthesia Transfer of Care Note  Patient: Shirley Terrell  Procedure(s) Performed: Procedure(s) (LRB): LAPAROSCOPIC CHOLECYSTECTOMY WITH INTRAOPERATIVE CHOLANGIOGRAM (N/A)  Patient Location: PACU  Anesthesia Type: General  Level of Consciousness: awake, alert  and oriented  Airway & Oxygen Therapy: Patient Spontanous Breathing and Patient connected to nasal cannula oxygen  Post-op Assessment: Report given to PACU RN, Post -op Vital signs reviewed and stable and Patient moving all extremities X 4  Post vital signs: Reviewed and stable  Complications: No apparent anesthesia complications

## 2012-04-13 NOTE — Anesthesia Postprocedure Evaluation (Signed)
  Anesthesia Post-op Note  Patient: Shirley Terrell  Procedure(s) Performed: Procedure(s) (LRB): LAPAROSCOPIC CHOLECYSTECTOMY WITH INTRAOPERATIVE CHOLANGIOGRAM (N/A)  Patient Location: PACU  Anesthesia Type: General  Level of Consciousness: awake and alert   Airway and Oxygen Therapy: Patient Spontanous Breathing  Post-op Pain: mild  Post-op Assessment: Post-op Vital signs reviewed, Patient's Cardiovascular Status Stable, Respiratory Function Stable, Patent Airway and No signs of Nausea or vomiting  Post-op Vital Signs: stable  Complications: No apparent anesthesia complications

## 2012-04-13 NOTE — Op Note (Signed)
Preoperative diagnosis:  Symptomatic cholelithiasis  Postoperative diagnosis:  Same  Procedure: Laparoscopic cholecystectomy with cholangiogram.  Surgeon: Avel Peace, M.D.  Asst.:  Glenna Fellows M.D.  Anesthesia: Gen.  Indication:   This is a 26 year old female whose been having biliary colic type pain. Ultrasound demonstrates gallstones. She now presents for elective laparoscopic cholecystectomy. The procedure, risks, and after care were discussed with her preoperatively.  Technique: She was brought to the operating room, placed supine on the operating table, and a general anesthetic was administered. The hair on the abdominal wall was clipped as was necessary. The abdominal wall was then sterilely prepped and draped. Local anesthetic (Marcaine) was infiltrated in the subumbilical region. A small subumbilical incision was made through the skin, subcutaneous tissue, fascia, and peritoneum entering the peritoneal cavity under direct vision. A pursestring suture of 0 Vicryl was placed around the edges of the fascia. A Hassan trocar was introduced into the peritoneal cavity and a pneumoperitoneum was created by insufflation of carbon dioxide gas. The laparoscope was introduced into the trocar and no underlying bleeding or organ injury was noted. The patient was then placed in the reverse Trendelenburg position with the right side tilted slightly up.  Three more trochars were then placed into the abdominal cavity under laparoscopic vision. One in the epigastric area, and 2 in the right upper quadrant area. The gallbladder was visualized and adhesions to it between the omentum and duodenum were separated bluntly. The fundus was grasped and retracted toward the right shoulder.  The infundibulum was mobilized with dissection close to the gallbladder and retracted laterally. The cystic duct was identified and a window was created around it.  The critical view was achieved. A clip was placed at the  neck of the gallbladder. A small incision was made in the cystic duct. A cholangiocatheter was introduced through the anterior abdominal wall and placed in the cystic duct. A intraoperative cholangiogram was then performed.  Under real-time fluoroscopy, dilute contrast was injected into the cystic duct.  The common hepatic duct, the right and left hepatic ducts, and the common duct were all visualized. Contrast drained into the duodenum without obvious evidence of any obstructing ductal lesion. The final report is pending the Radiologist's interpretation.  The cholangiocatheter was removed, the cystic duct was clipped 3 times on the biliary side, and then the cystic duct was divided sharply. No bile leak was noted from the cystic duct stump.  Anterior and posterior branches of the cystic artery were identified, isolated, and  then clipped and divided. Following this the gallbladder was dissected free from the liver using electrocautery. The gallbladder was then placed in a retrieval bag and removed from the abdominal cavity through the subumbilical incision.  The gallbladder fossa was inspected, irrigated, and bleeding was controlled with electrocautery. Inspection showed that hemostasis was adequate and there was no evidence of bile leak.  The irrigation fluid was evacuated as much as possible.  The subumbilical trocar was removed and the fascial defect was closed by tightening and tying down the pursestring suture under laparoscopic vision.  The remaining trochars were removed and the pneumoperitoneum was released. The skin incisions were closed with 4-0 Monocryl subcuticular stitches. Steri-Strips and sterile dressings were applied.  The procedure was well-tolerated without any apparent complications. The patient was taken to the recovery room in satisfactory condition.

## 2012-04-13 NOTE — Interval H&P Note (Signed)
History and Physical Interval Note:  04/13/2012 9:23 AM  Shirley Terrell  has presented today for surgery, with the diagnosis of Symptomatic cholelithiasis  The various methods of treatment have been discussed with the patient and family. After consideration of risks, benefits and other options for treatment, the patient has consented to  Procedure(s) (LRB): LAPAROSCOPIC CHOLECYSTECTOMY WITH INTRAOPERATIVE CHOLANGIOGRAM (N/A) as a surgical intervention .  The patients' history has been reviewed, patient examined, no change in status, stable for surgery.  I have reviewed the patients' chart and labs.  Questions were answered to the patient's satisfaction.     [name] Shela Commons

## 2012-04-13 NOTE — Anesthesia Preprocedure Evaluation (Signed)
Anesthesia Evaluation  Patient identified by MRN, date of birth, ID band Patient awake    Reviewed: Allergy & Precautions, H&P , NPO status , Patient's Chart, lab work & pertinent test results  Airway Mallampati: II TM Distance: <3 FB Neck ROM: Full    Dental No notable dental hx.    Pulmonary neg pulmonary ROS,  breath sounds clear to auscultation  Pulmonary exam normal       Cardiovascular negative cardio ROS  Rhythm:Regular Rate:Normal     Neuro/Psych negative neurological ROS  negative psych ROS   GI/Hepatic negative GI ROS, Neg liver ROS,   Endo/Other  Morbid obesity  Renal/GU negative Renal ROS  negative genitourinary   Musculoskeletal negative musculoskeletal ROS (+)   Abdominal   Peds negative pediatric ROS (+)  Hematology negative hematology ROS (+)   Anesthesia Other Findings   Reproductive/Obstetrics negative OB ROS                           Anesthesia Physical Anesthesia Plan  ASA: II  Anesthesia Plan: General   Post-op Pain Management:    Induction: Intravenous  Airway Management Planned: Oral ETT  Additional Equipment:   Intra-op Plan:   Post-operative Plan: Extubation in OR  Informed Consent: I have reviewed the patients History and Physical, chart, labs and discussed the procedure including the risks, benefits and alternatives for the proposed anesthesia with the patient or authorized representative who has indicated his/her understanding and acceptance.   Dental advisory given  Plan Discussed with: CRNA  Anesthesia Plan Comments:         Anesthesia Quick Evaluation  

## 2012-04-13 NOTE — Preoperative (Signed)
Beta Blockers   Reason not to administer Beta Blockers:Not Applicable 

## 2012-04-13 NOTE — Anesthesia Procedure Notes (Addendum)
Performed by: Marena Chancy   Procedure Name: Intubation Date/Time: 04/13/2012 9:36 AM Performed by: Marena Chancy Pre-anesthesia Checklist: Patient identified, Emergency Drugs available, Suction available, Patient being monitored and Timeout performed Patient Re-evaluated:Patient Re-evaluated prior to inductionOxygen Delivery Method: Circle system utilized Preoxygenation: Pre-oxygenation with 100% oxygen Intubation Type: IV induction Ventilation: Mask ventilation without difficulty and Oral airway inserted - appropriate to patient size Laryngoscope Size: Hyacinth Meeker and 2 Grade View: Grade I Tube type: Oral Tube size: 7.5 mm Number of attempts: 1 Placement Confirmation: ETT inserted through vocal cords under direct vision,  breath sounds checked- equal and bilateral and positive ETCO2 Secured at: 21 cm Tube secured with: Tape Dental Injury: Teeth and Oropharynx as per pre-operative assessment

## 2012-04-13 NOTE — Discharge Instructions (Signed)
[facility] ______[facility] LAPAROSCOPIC SURGERY: POST OP INSTRUCTIONS Always review your discharge instruction sheet given to you by the facility where your surgery was performed. IF YOU HAVE DISABILITY OR FAMILY LEAVE FORMS, YOU MUST BRING THEM TO THE OFFICE FOR PROCESSING.   DO NOT GIVE THEM TO YOUR DOCTOR.  1. A prescription for pain medication may be given to you upon discharge.  Take your pain medication as prescribed, if needed.  If narcotic pain medicine is not needed, then you may take acetaminophen (Tylenol) or ibuprofen (Advil) as needed. 2. Take your usually prescribed medications unless otherwise directed. 3. If you need a refill on your pain medication, please contact your pharmacy.  They will contact our office to request authorization. Prescriptions will not be filled after 5pm or on week-ends. 4. You should follow a light diet the first few days after arrival home, such as soup and crackers, etc.  Be sure to include lots of fluids daily. 5. Most patients will experience some swelling and bruising in the area of the incisions.  Ice packs will help.  Swelling and bruising can take several days to resolve.  6. It is common to experience some constipation if taking pain medication after surgery.  Increasing fluid intake and taking a stool softener (such as Colace) will usually help or prevent this problem from occurring.  A mild laxative (Milk of Magnesia or Miralax) should be taken according to package instructions if there are no bowel movements after 48 hours. 7. Unless discharge instructions indicate otherwise, you may remove your bandages 72 hours after surgery, and you may shower at that time.  You may have steri-strips (small skin tapes) in place directly over the incision.  These strips should be left on the skin for.  If your surgeon used skin glue on the incision, you may shower in 24 hours.  The glue will flake off over the next 2-3 weeks.  Any sutures or staples will  be removed at the office during your follow-up visit. 8. ACTIVITIES:  You may resume regular (light) daily activities beginning the next day--such as daily self-care, walking, climbing stairs--gradually increasing activities as tolerated.  You may have sexual intercourse when it is comfortable.  Refrain from any heavy lifting or straining for two weeks-nothing over 10 pounds. a. You may drive when you are no longer taking prescription pain medication, you can comfortably wear a seatbelt, and you can safely maneuver your car and apply brakes. b. RETURN TO WORK:  __Desk work in one week, full duty in two weeks.________________________________________________________ 9. You should see your doctor in the office for a follow-up appointment approximately 2-3 weeks after your surgery.  Make sure that you call for this appointment within a day or two after you arrive home to insure a convenient appointment time. 10. OTHER INSTRUCTIONS: __________________________________________________________________________________________________________________________ __________________________________________________________________________________________________________________________ WHEN TO CALL YOUR DOCTOR: 1. Fever over 101.5 2. Inability to urinate 3. Continued bleeding from incision. 4. Increased pain, redness, or drainage from the incision. 5. Increasing abdominal pain  The clinic staff is available to answer your questions during regular business hours.  Please don't hesitate to call and ask to speak to one of the nurses for clinical concerns.  If you have a medical emergency, go to the nearest emergency room or call 911.  A surgeon from [facility] is always on call at the hospital. [address], [address], [place], [place]  [address] ? [address] [address], [place], [place]   [address] [phone] ?  [phone] ? FAX [phone] Web site: [email or web address]

## 2012-04-14 ENCOUNTER — Encounter (HOSPITAL_COMMUNITY): Payer: Self-pay | Admitting: General Surgery

## 2012-05-11 ENCOUNTER — Encounter (INDEPENDENT_AMBULATORY_CARE_PROVIDER_SITE_OTHER): Payer: Self-pay | Admitting: General Surgery

## 2012-05-11 ENCOUNTER — Encounter (INDEPENDENT_AMBULATORY_CARE_PROVIDER_SITE_OTHER): Payer: Self-pay

## 2012-05-11 ENCOUNTER — Ambulatory Visit (INDEPENDENT_AMBULATORY_CARE_PROVIDER_SITE_OTHER): Payer: Medicaid Other | Admitting: General Surgery

## 2012-05-11 VITALS — BP 100/82 | HR 90 | Resp 18 | Ht 64.0 in | Wt 218.0 lb

## 2012-05-11 DIAGNOSIS — Z9889 Other specified postprocedural states: Secondary | ICD-10-CM

## 2012-05-11 NOTE — Patient Instructions (Addendum)
Activities as tolerated.  Lifelong lowfat diet.

## 2012-05-11 NOTE — Progress Notes (Signed)
Operation:  Laparoscopic cholecystectomy with cholangiogram  Date:  Apr 13, 2012  Pathology: Chronic cholecystitis and cholelithiasis  HPI:  She is here for her first postoperative visit. She is tolerating a bland diet. She tried eating some spaghetti with meat balls but this made her sick.   Physical Exam: Gen.-she looks well and is in no acute distress.  Abdomen-soft, incisions are clean, dry, and intact.  Assessment:  Doing well postoperatively. Does have some food intolerance we discussed this.  Plan:  Lifelong low fat as directed. Activities as tolerated. Return visit as needed.

## 2012-09-24 IMAGING — RF DG CHOLANGIOGRAM OPERATIVE
1 series · 6 of 6 positions shown · non-contrast
Comparison: None

CLINICAL DATA: Cholelithiasis

INTRAOPERATIVE CHOLANGIOGRAM
TECHNIQUE: Cholangiographic images from the C-arm fluoroscopic
device were submitted for interpretation post-operatively.  Please
see the procedural report for the amount of contrast and the
fluoroscopy time utilized.

[Series 1: run · 3 acquisitions, 6 frames shown]
[im 1/3]
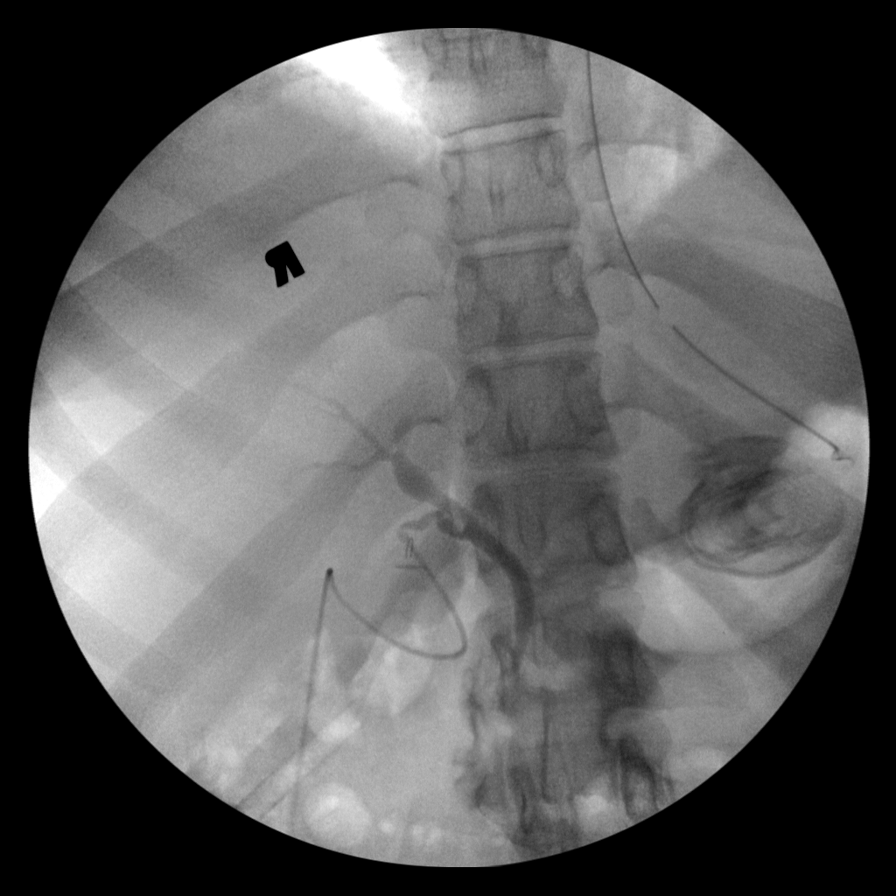
[im 1/3]
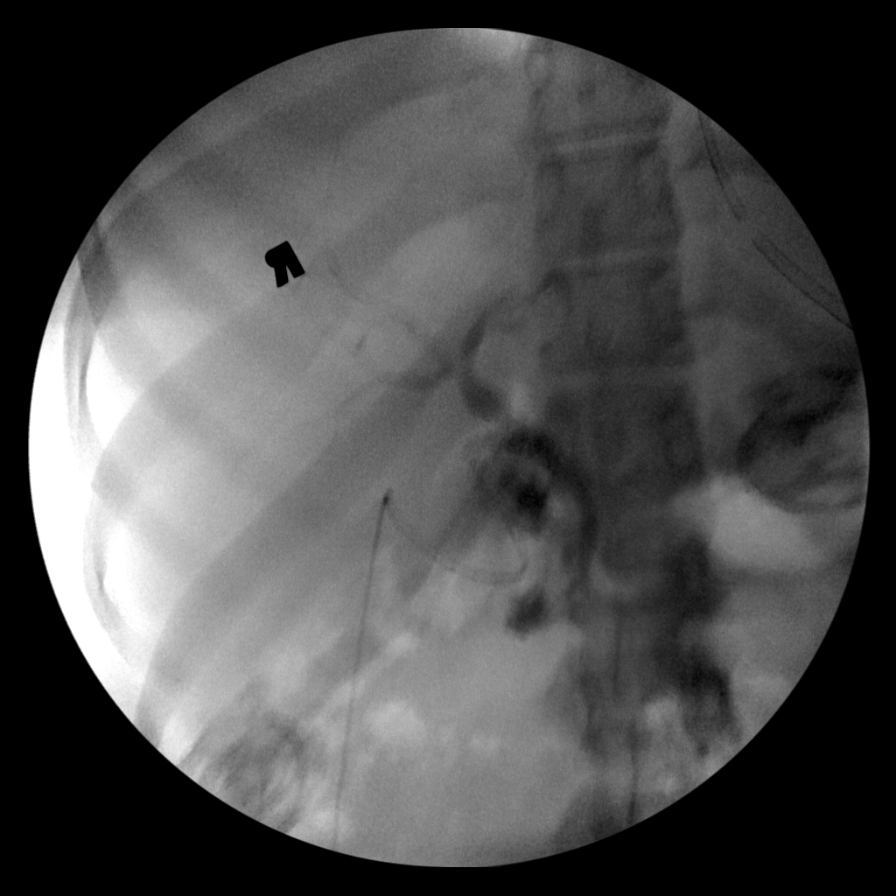
[im 1/3]
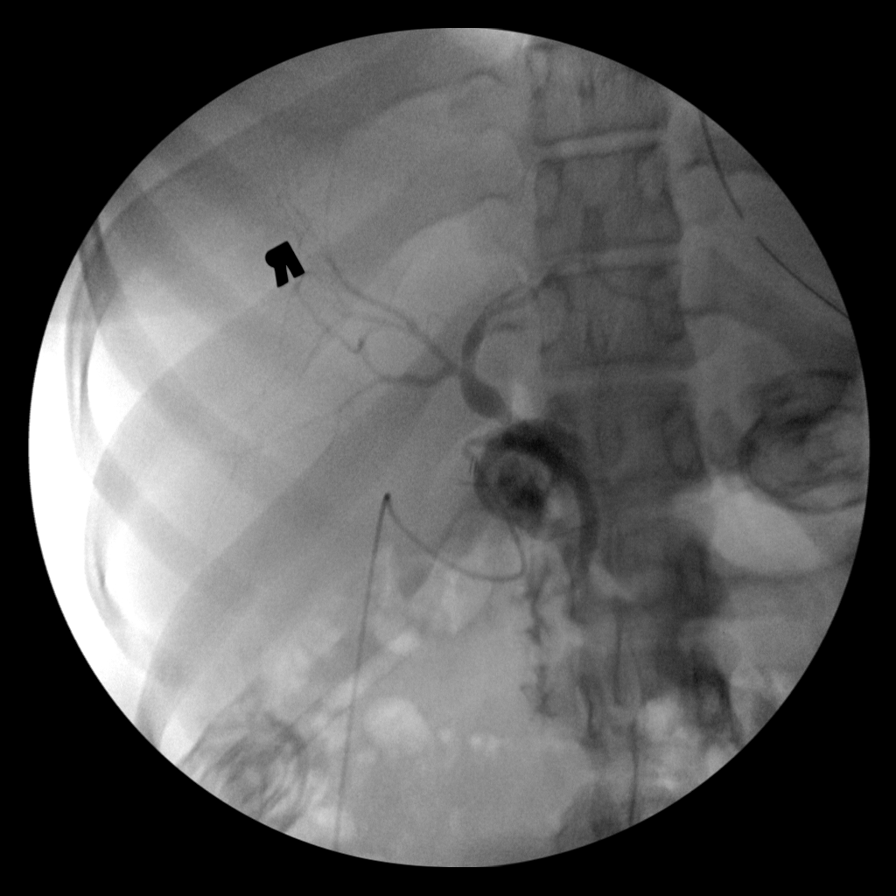
[im 1/3]
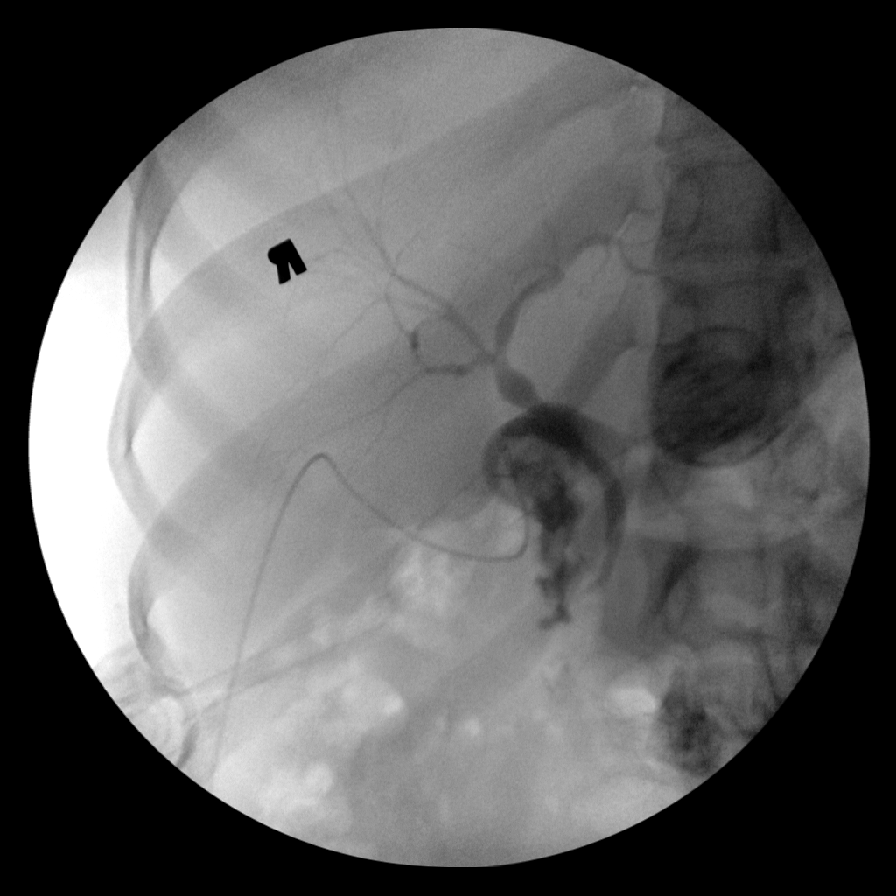
[im 2/3]
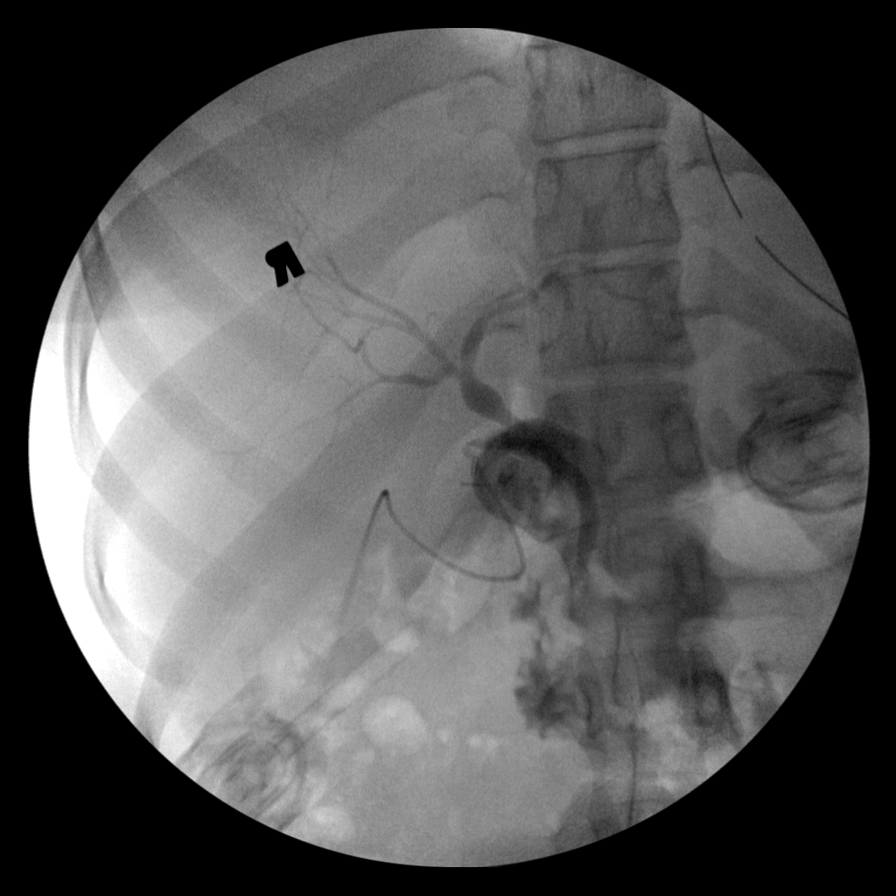
[im 3/3]
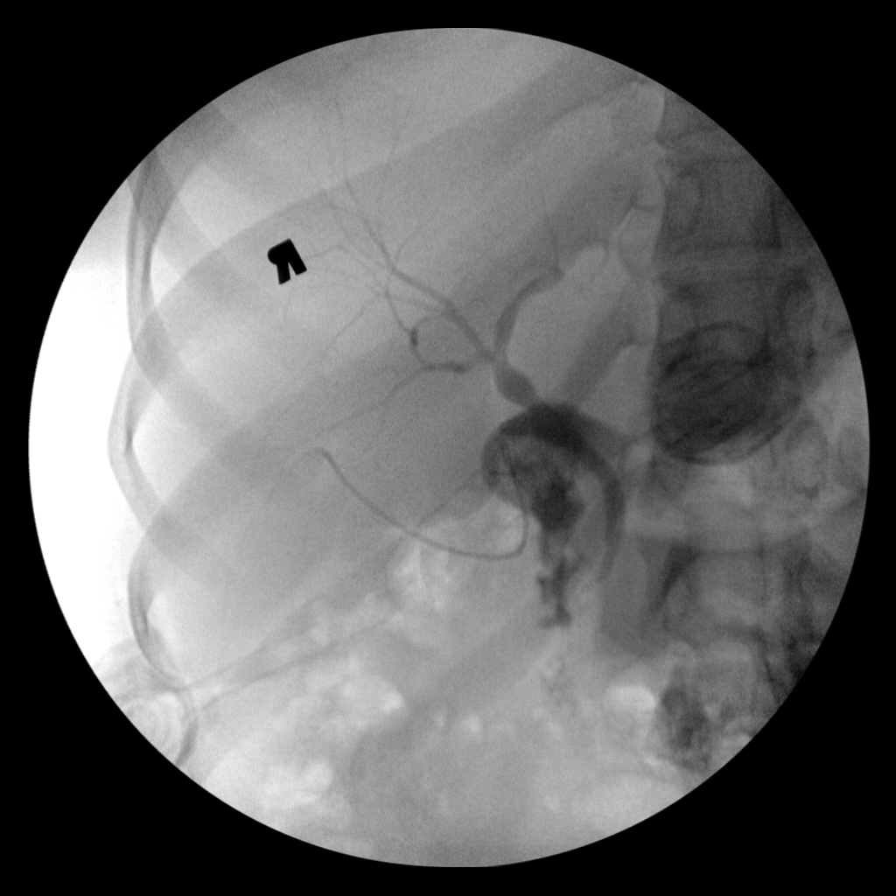

[6 of 6 positions shown; findings below may reference images not displayed]

FINDINGS: No persistent filling defects in the common duct.
Intrahepatic ducts are incompletely visualized, appearing
decompressed centrally. Contrast passes into the duodenum.

IMPRESSION

Negative for retained common duct stone.

## 2013-01-22 ENCOUNTER — Inpatient Hospital Stay (HOSPITAL_COMMUNITY)
Admission: AD | Admit: 2013-01-22 | Discharge: 2013-01-22 | Disposition: A | Discharge status: Home / Self Care | Payer: Medicaid Other | Source: Ambulatory Visit | Attending: Obstetrics | Admitting: Obstetrics

## 2013-01-22 ENCOUNTER — Encounter (HOSPITAL_COMMUNITY): Payer: Self-pay | Admitting: Obstetrics and Gynecology

## 2013-01-22 DIAGNOSIS — R51 Headache: Secondary | ICD-10-CM | POA: Insufficient documentation

## 2013-01-22 DIAGNOSIS — A599 Trichomoniasis, unspecified: Secondary | ICD-10-CM

## 2013-01-22 DIAGNOSIS — N949 Unspecified condition associated with female genital organs and menstrual cycle: Secondary | ICD-10-CM | POA: Insufficient documentation

## 2013-01-22 DIAGNOSIS — A5901 Trichomonal vulvovaginitis: Secondary | ICD-10-CM | POA: Insufficient documentation

## 2013-01-22 LAB — WET PREP, GENITAL
Clue Cells Wet Prep HPF POC: NONE SEEN
Trich, Wet Prep: NONE SEEN
Yeast Wet Prep HPF POC: NONE SEEN

## 2013-01-22 LAB — URINALYSIS, ROUTINE W REFLEX MICROSCOPIC
Bilirubin Urine: NEGATIVE
Glucose, UA: NEGATIVE mg/dL
Ketones, ur: NEGATIVE mg/dL
Protein, ur: NEGATIVE mg/dL
Urobilinogen, UA: 0.2 mg/dL (ref 0.0–1.0)

## 2013-01-22 LAB — URINE MICROSCOPIC-ADD ON

## 2013-01-22 MED ORDER — METRONIDAZOLE 500 MG PO TABS
2000.0000 mg | ORAL_TABLET | Freq: Once | ORAL | Status: AC
  Administered 2013-01-22: 2000 mg via ORAL
  Filled 2013-01-22: qty 4

## 2013-01-22 NOTE — MAU Provider Note (Signed)
History     CSN: [id number]  Arrival date and time: 01/22/13 4098   First Provider Initiated Contact with Patient 01/22/13 1845      Chief Complaint  Patient presents with  . Vaginal Pain  . Vaginitis   Patient is a 27 y.o. female presenting with vaginal pain. The history is provided by the patient.  Vaginal Pain Associated symptoms include headaches. Pertinent negatives include no abdominal pain, chills, fever, nausea or vomiting.  pt is not pregnant and presents with c/o of vaginal itching with discharge for 2 weeks.  She has a Mirena IUD.  Pt has a hx of Chlamydia.  Pt denies pain with urination, constipation or diarrhea.  Past Medical History  Diagnosis Date  . Gallstones   . Asthma     no problems since 2009  . Anemia     Past Surgical History  Procedure Laterality Date  . Cholecystectomy  04/13/2012    Procedure: LAPAROSCOPIC CHOLECYSTECTOMY WITH INTRAOPERATIVE CHOLANGIOGRAM;  Surgeon: Adolph Pollack, MD;  Location: [facility] OR;  Service: General;  Laterality: N/A;  laparoscopic cholecystectomy with IOC    Family History  Problem Relation Age of Onset  . Hypertension Mother   . Stroke Mother   . Diabetes Other   . Heart disease Father   . Depression Father   . Cancer Paternal Grandmother     breast  . Diabetes Paternal Grandmother     History  Substance Use Topics  . Smoking status: Light Tobacco Smoker    Types: Cigarettes  . Smokeless tobacco: Never Used  . Alcohol Use: Yes     Comment: socially    Allergies: No Known Allergies  Prescriptions prior to admission  Medication Sig Dispense Refill  . albuterol (PROVENTIL HFA;VENTOLIN HFA) 108 (90 BASE) MCG/ACT inhaler Inhale 2 puffs into the lungs every 6 (six) hours as needed (Asthma).        Review of Systems  Constitutional: Negative for fever and chills.  HENT:       Constant headache-pt thinks it is because she is not taking her iron supplement  Eyes: Negative for blurred vision and double vision.   Gastrointestinal: Negative for nausea, vomiting, abdominal pain and constipation.  Genitourinary: Positive for vaginal pain. Negative for dysuria and urgency.  Musculoskeletal: Positive for back pain.  Neurological: Positive for headaches.   Physical Exam   Blood pressure 124/82, pulse 106, temperature 98.4 F (36.9 C), temperature source Oral, resp. rate 18, height 5\' 4"  (1.626 m), weight 204 lb (92.534 kg), last menstrual period 12/18/2012.  Physical Exam  Nursing note and vitals reviewed. Constitutional: She is oriented to person, place, and time. She appears well-developed and well-nourished.  HENT:  Head: Normocephalic.  Eyes: Pupils are equal, round, and reactive to light.  Neck: Normal range of motion. Neck supple.  Cardiovascular: Normal rate.   Respiratory: Effort normal.  GI: Soft. She exhibits no distension. There is no tenderness. There is no rebound and no guarding.  Genitourinary:  Mod amount of creamy yellow discharge in vault; vaginal mucosa reddened; cervix parous, petechiae with IUD string noted; uterus and adnexal without palpable enlargement or tenderness  Musculoskeletal: Normal range of motion.  Neurological: She is alert and oriented to person, place, and time.  Skin: Skin is warm and dry.  Psychiatric: She has a normal mood and affect.    MAU Course  Procedures Results for orders placed during the hospital encounter of 01/22/13 (from the past 24 hour(s))  URINALYSIS, ROUTINE W REFLEX MICROSCOPIC  Status: Abnormal   Collection Time    01/22/13  6:05 PM      Result Value Range   Color, Urine YELLOW  YELLOW   APPearance HAZY (*) CLEAR   Specific Gravity, Urine 1.010  1.005 - 1.030   pH 7.0  5.0 - 8.0   Glucose, UA NEGATIVE  NEGATIVE mg/dL   Hgb urine dipstick MODERATE (*) NEGATIVE   Bilirubin Urine NEGATIVE  NEGATIVE   Ketones, ur NEGATIVE  NEGATIVE mg/dL   Protein, ur NEGATIVE  NEGATIVE mg/dL   Urobilinogen, UA 0.2  0.0 - 1.0 mg/dL   Nitrite  NEGATIVE  NEGATIVE   Leukocytes, UA LARGE (*) NEGATIVE  URINE MICROSCOPIC-ADD ON     Status: Abnormal   Collection Time    01/22/13  6:05 PM      Result Value Range   Squamous Epithelial / LPF FEW (*) RARE   WBC, UA 21-50  <3 WBC/hpf   RBC / HPF 0-2  <3 RBC/hpf   Bacteria, UA MANY (*) RARE   Urine-Other TRICHOMONAS PRESENT    POCT PREGNANCY, URINE     Status: None   Collection Time    01/22/13  6:14 PM      Result Value Range   Preg Test, Ur NEGATIVE  NEGATIVE  WET PREP, GENITAL     Status: Abnormal   Collection Time    01/22/13  6:37 PM      Result Value Range   Yeast Wet Prep HPF POC NONE SEEN  NONE SEEN   Trich, Wet Prep NONE SEEN  NONE SEEN   Clue Cells Wet Prep HPF POC NONE SEEN  NONE SEEN   WBC, Wet Prep HPF POC MANY (*) NONE SEEN  treated for Trichomonas with Flagyl 2 gm in MAU- partner to be treated GC/Chlamydia pending  Assessment and Plan  Trichomonas vaginitis- treated in MAU- partner to get treated F/u with Dr. Rubin Payor 01/22/2013, 7:49 PM

## 2013-01-22 NOTE — MAU Note (Signed)
Pt presents with complaints of vaginal  Itching for approximately 2 weeks. States she has one sexual partner and wants to be checked for STDs in case she has something form her partner.

## 2013-01-22 NOTE — MAU Note (Signed)
"  I believe I have a yeast infection.  I've never had one before, so I'm not sure.  I feel like some things are abnormal down there and aren't the same.  I just got the Mirena in December and was told that some itching maybe normal.  I might be pregnant and have a possible STI."

## 2013-01-24 LAB — URINE CULTURE: Colony Count: 50000

## 2013-05-23 ENCOUNTER — Inpatient Hospital Stay (HOSPITAL_COMMUNITY)
Admission: AD | Admit: 2013-05-23 | Discharge: 2013-05-23 | Disposition: A | Discharge status: Home / Self Care | Payer: Medicaid Other | Source: Ambulatory Visit | Attending: Obstetrics & Gynecology | Admitting: Obstetrics & Gynecology

## 2013-05-23 ENCOUNTER — Encounter (HOSPITAL_COMMUNITY): Payer: Self-pay | Admitting: *Deleted

## 2013-05-23 DIAGNOSIS — Z30431 Encounter for routine checking of intrauterine contraceptive device: Secondary | ICD-10-CM | POA: Insufficient documentation

## 2013-05-23 DIAGNOSIS — Z202 Contact with and (suspected) exposure to infections with a predominantly sexual mode of transmission: Secondary | ICD-10-CM

## 2013-05-23 LAB — URINALYSIS, ROUTINE W REFLEX MICROSCOPIC
Nitrite: NEGATIVE
Protein, ur: NEGATIVE mg/dL
Urobilinogen, UA: 0.2 mg/dL (ref 0.0–1.0)

## 2013-05-23 LAB — POCT PREGNANCY, URINE: Preg Test, Ur: NEGATIVE

## 2013-05-23 MED ORDER — ONDANSETRON 8 MG PO TBDP
8.0000 mg | ORAL_TABLET | ORAL | Status: AC
  Administered 2013-05-23: 8 mg via ORAL
  Filled 2013-05-23: qty 1

## 2013-05-23 MED ORDER — METRONIDAZOLE 500 MG PO TABS
2000.0000 mg | ORAL_TABLET | ORAL | Status: AC
  Administered 2013-05-23: 2000 mg via ORAL
  Filled 2013-05-23: qty 4

## 2013-05-23 NOTE — MAU Provider Note (Signed)
Chief Complaint: Exposure to STD   First Provider Initiated Contact with Patient 05/23/13 1004     SUBJECTIVE HPI: Shirley Terrell is a 27 y.o. G1P1001 who presents to maternity admissions reporting known exposure to trichomonas and reports she has been unable to feel her Mirena string x3 months.  Mirena was placed in December 2013 and pt was able to feel string for a few months after that.  She denies vaginal bleeding, vaginal itching/burning, urinary symptoms, h/a, dizziness, n/v, or fever/chills.     Past Medical History  Diagnosis Date  . Gallstones   . Asthma     no problems since 2009  . Anemia   . Trichomonas    Past Surgical History  Procedure Laterality Date  . Cholecystectomy  04/13/2012    Procedure: LAPAROSCOPIC CHOLECYSTECTOMY WITH INTRAOPERATIVE CHOLANGIOGRAM;  Surgeon: Adolph Pollack, MD;  Location: [facility] OR;  Service: General;  Laterality: N/A;  laparoscopic cholecystectomy with IOC   History   Social History  . Marital Status: Single    Spouse Name: N/A    Number of Children: N/A  . Years of Education: N/A   Occupational History  . Not on file.   Social History Main Topics  . Smoking status: Light Tobacco Smoker    Types: Cigarettes  . Smokeless tobacco: Never Used  . Alcohol Use: Yes     Comment: socially  . Drug Use: No  . Sexually Active: Not Currently    Birth Control/ Protection: IUD     Comment: last intercourse was 3 weeks ago   Other Topics Concern  . Not on file   Social History Narrative  . No narrative on file   No current facility-administered medications on file prior to encounter.   Current Outpatient Prescriptions on File Prior to Encounter  Medication Sig Dispense Refill  . albuterol (PROVENTIL HFA;VENTOLIN HFA) 108 (90 BASE) MCG/ACT inhaler Inhale 2 puffs into the lungs every 6 (six) hours as needed (Asthma).       No Known Allergies  ROS: Pertinent items in HPI  OBJECTIVE Blood pressure 124/77, pulse 89, temperature 98.3  F (36.8 C), temperature source Oral, resp. rate 18, height 5\' 3"  (1.6 m), weight 94.348 kg (208 lb), last menstrual period 05/01/2013. GENERAL: Well-developed, well-nourished female in no acute distress.  HEENT: Normocephalic HEART: normal rate RESP: normal effort ABDOMEN: Soft, non-tender EXTREMITIES: Nontender, no edema NEURO: Alert and oriented Pelvic exam: Cervix pink, visually closed, without lesion, Mirena string visible, scant white creamy discharge, vaginal walls and external genitalia normal   LAB RESULTS Results for orders placed during the hospital encounter of 05/23/13 (from the past 24 hour(s))  URINALYSIS, ROUTINE W REFLEX MICROSCOPIC     Status: Abnormal   Collection Time    05/23/13  8:50 AM      Result Value Range   Color, Urine STRAW (*) YELLOW   APPearance CLEAR  CLEAR   Specific Gravity, Urine <1.005 (*) 1.005 - 1.030   pH 6.0  5.0 - 8.0   Glucose, UA NEGATIVE  NEGATIVE mg/dL   Hgb urine dipstick SMALL (*) NEGATIVE   Bilirubin Urine NEGATIVE  NEGATIVE   Ketones, ur NEGATIVE  NEGATIVE mg/dL   Protein, ur NEGATIVE  NEGATIVE mg/dL   Urobilinogen, UA 0.2  0.0 - 1.0 mg/dL   Nitrite NEGATIVE  NEGATIVE   Leukocytes, UA SMALL (*) NEGATIVE  URINE MICROSCOPIC-ADD ON     Status: Abnormal   Collection Time    05/23/13  8:50 AM  Result Value Range   Squamous Epithelial / LPF MANY (*) RARE   WBC, UA 0-2  <3 WBC/hpf   Bacteria, UA FEW (*) RARE  POCT PREGNANCY, URINE     Status: None   Collection Time    05/23/13  8:53 AM      Result Value Range   Preg Test, Ur NEGATIVE  NEGATIVE    ASSESSMENT 1. IUD check up   2. Possible exposure to STD     PLAN Flagyl 2000 mg PO, Zofran 8 mg ODT in MAU [facility] pending Discharge home Return to MAU as needed   Sharen Counter Certified Nurse-Midwife 05/23/2013  10:30 AM

## 2013-05-23 NOTE — MAU Note (Signed)
Cannot feel "birth control string."  States she is feeling some mild pain like when she first got her IUD. States she received an anonymous phone call this morning stating that she should be checked for an STD because of what her partner has been doing.

## 2013-05-23 NOTE — MAU Note (Signed)
Can't feel strings on IUD.  Received phone call this morning that a partner she has - has trich.

## 2013-05-24 LAB — GC/CHLAMYDIA PROBE AMP: GC Probe RNA: NEGATIVE

## 2013-09-22 ENCOUNTER — Other Ambulatory Visit: Payer: Self-pay

## 2013-11-23 ENCOUNTER — Encounter (HOSPITAL_COMMUNITY): Payer: Self-pay | Admitting: *Deleted

## 2013-11-23 ENCOUNTER — Inpatient Hospital Stay (HOSPITAL_COMMUNITY)
Admission: AD | Admit: 2013-11-23 | Discharge: 2013-11-23 | Disposition: A | Discharge status: Home / Self Care | Payer: Medicaid Other | Source: Ambulatory Visit | Attending: Obstetrics & Gynecology | Admitting: Obstetrics & Gynecology

## 2013-11-23 DIAGNOSIS — N898 Other specified noninflammatory disorders of vagina: Secondary | ICD-10-CM | POA: Insufficient documentation

## 2013-11-23 DIAGNOSIS — Z202 Contact with and (suspected) exposure to infections with a predominantly sexual mode of transmission: Secondary | ICD-10-CM

## 2013-11-23 DIAGNOSIS — A5619 Other chlamydial genitourinary infection: Secondary | ICD-10-CM | POA: Insufficient documentation

## 2013-11-23 DIAGNOSIS — A599 Trichomoniasis, unspecified: Secondary | ICD-10-CM

## 2013-11-23 DIAGNOSIS — A5901 Trichomonal vulvovaginitis: Secondary | ICD-10-CM | POA: Insufficient documentation

## 2013-11-23 DIAGNOSIS — N739 Female pelvic inflammatory disease, unspecified: Secondary | ICD-10-CM | POA: Insufficient documentation

## 2013-11-23 LAB — URINE MICROSCOPIC-ADD ON

## 2013-11-23 LAB — URINALYSIS, ROUTINE W REFLEX MICROSCOPIC
BILIRUBIN URINE: NEGATIVE
GLUCOSE, UA: NEGATIVE mg/dL
KETONES UR: NEGATIVE mg/dL
Nitrite: NEGATIVE
PROTEIN: NEGATIVE mg/dL
Specific Gravity, Urine: 1.025 (ref 1.005–1.030)
UROBILINOGEN UA: 0.2 mg/dL (ref 0.0–1.0)
pH: 6.5 (ref 5.0–8.0)

## 2013-11-23 LAB — WET PREP, GENITAL
CLUE CELLS WET PREP: NONE SEEN
Trich, Wet Prep: NONE SEEN
YEAST WET PREP: NONE SEEN

## 2013-11-23 LAB — POCT PREGNANCY, URINE: PREG TEST UR: NEGATIVE

## 2013-11-23 MED ORDER — METRONIDAZOLE 500 MG PO TABS
500.0000 mg | ORAL_TABLET | Freq: Once | ORAL | Status: AC
  Administered 2013-11-23: 500 mg via ORAL
  Filled 2013-11-23: qty 1

## 2013-11-23 MED ORDER — AZITHROMYCIN 1 G PO PACK
1.0000 g | PACK | Freq: Once | ORAL | Status: AC
  Administered 2013-11-23: 1 g via ORAL
  Filled 2013-11-23: qty 1

## 2013-11-23 NOTE — MAU Note (Signed)
Patient states she has had a Mirena IUD since last December. States she has been able to feel the string until after her December period. Has been having a little burning after urinating and has been having a watery discharge with itching.

## 2013-11-23 NOTE — MAU Note (Signed)
Pt states she cant feel her mirena strings, pt states her ex has been unfaithful and the girl that she believes her ex cheated with said that she had chlamydia and pt states her ex has given her an STD before

## 2013-11-23 NOTE — MAU Provider Note (Signed)
Chief Complaint: No chief complaint on file.   First Provider Initiated Contact with Patient 11/23/13 1518     SUBJECTIVE HPI: Shirley Terrell is a 28 y.o. G1P1001 who presents with reported positive contact for Chlamydia. States her boyfriend she is on her and the girl told her she tested positive for Chlamydia. This has happened before with same partner. She has been having. Wallace Cullens TLO vaginal discharge. Denies abdominal pain or fever. Cannot feel Mirena IUD string which has been in place x 1 year.  Past Medical History  Diagnosis Date  . Gallstones   . Asthma     no problems since 2009  . Anemia   . Trichomonas    OB History  Gravida Para Term Preterm AB SAB TAB Ectopic Multiple Living  1 1 1       1     # Outcome Date GA Lbr Len/2nd Weight Sex Delivery Anes PTL Lv  1 TRM 01/14/09 [redacted]w[redacted]d   F SVD   Y     Past Surgical History  Procedure Laterality Date  . Cholecystectomy  04/13/2012    Procedure: LAPAROSCOPIC CHOLECYSTECTOMY WITH INTRAOPERATIVE CHOLANGIOGRAM;  Surgeon: Adolph Pollack, MD;  Location: [facility] OR;  Service: General;  Laterality: N/A;  laparoscopic cholecystectomy with IOC   History   Social History  . Marital Status: Single    Spouse Name: N/A    Number of Children: N/A  . Years of Education: N/A   Occupational History  . Not on file.   Social History Main Topics  . Smoking status: Light Tobacco Smoker    Types: Cigarettes  . Smokeless tobacco: Never Used  . Alcohol Use: Yes     Comment: socially  . Drug Use: No  . Sexual Activity: Not Currently    Birth Control/ Protection: IUD     Comment: last intercourse was 3 weeks ago   Other Topics Concern  . Not on file   Social History Narrative  . No narrative on file   No current facility-administered medications on file prior to encounter.   Current Outpatient Prescriptions on File Prior to Encounter  Medication Sig Dispense Refill  . IRON PO Take 1 tablet by mouth daily.      Marland Kitchen albuterol (PROVENTIL  HFA;VENTOLIN HFA) 108 (90 BASE) MCG/ACT inhaler Inhale 2 puffs into the lungs every 6 (six) hours as needed (Asthma).       No Known Allergies  ROS: Pertinent items in HPI  OBJECTIVE Blood pressure 137/98, pulse 86, temperature 98.4 F (36.9 C), temperature source Oral, resp. rate 100, height 5\' 3"  (1.6 m), weight 213 lb 3.2 oz (96.707 kg), last menstrual period 11/08/2013, SpO2 100.00%. GENERAL: Well-developed, well-nourished female in no acute distress.  HEENT: Normocephalic HEART: normal rate RESP: normal effort ABDOMEN: Soft, non-tender EXTREMITIES: Nontender, no edema NEURO: Alert and oriented SPECULUM EXAM: NEFG,  Large amount grey mucusy discharge, no blood noted, cervix reddened 1 cm around os and IUD string retrieved from endocx with q-tip BIMANUAL No CMT; uterus normal size, no adnexal tenderness or masses  LAB RESULTS Results for orders placed during the hospital encounter of 11/23/13 (from the past 24 hour(s))  URINALYSIS, ROUTINE W REFLEX MICROSCOPIC     Status: Abnormal   Collection Time    11/23/13  1:55 PM      Result Value Range   Color, Urine YELLOW  YELLOW   APPearance HAZY (*) CLEAR   Specific Gravity, Urine 1.025  1.005 - 1.030   pH 6.5  5.0 - 8.0   Glucose, UA NEGATIVE  NEGATIVE mg/dL   Hgb urine dipstick SMALL (*) NEGATIVE   Bilirubin Urine NEGATIVE  NEGATIVE   Ketones, ur NEGATIVE  NEGATIVE mg/dL   Protein, ur NEGATIVE  NEGATIVE mg/dL   Urobilinogen, UA 0.2  0.0 - 1.0 mg/dL   Nitrite NEGATIVE  NEGATIVE   Leukocytes, UA LARGE (*) NEGATIVE  URINE MICROSCOPIC-ADD ON     Status: Abnormal   Collection Time    11/23/13  1:55 PM      Result Value Range   Squamous Epithelial / LPF MANY (*) RARE   WBC, UA 0-2  <3 WBC/hpf   RBC / HPF 0-2  <3 RBC/hpf   Urine-Other TRICHOMONAS PRESENT    POCT PREGNANCY, URINE     Status: None   Collection Time    11/23/13  2:24 PM      Result Value Range   Preg Test, Ur NEGATIVE  NEGATIVE    IMAGING No results  found.  MAU COURSE GC/CT sent. Treatead for CT and trich while here: Flagyl 2 gm Meds ordered this encounter  Medications  . metroNIDAZOLE (FLAGYL) tablet 500 mg    Sig:   . azithromycin (ZITHROMAX) powder 1 g    Sig:    ASSESSMENT 1. Chlamydia contact, untreated   2. Trichimoniasis     PLAN Discharge home See AVS   Medication List         albuterol 108 (90 BASE) MCG/ACT inhaler  Commonly known as:  PROVENTIL HFA;VENTOLIN HFA  Inhale 2 puffs into the lungs every 6 (six) hours as needed for wheezing.     IRON PO  Take 1 tablet by mouth daily.       Follow-up Information   Schedule an appointment as soon as possible [facility] [place] (STD clilnic to test for all STDs)    Contact information:   [address] [place] [id number] [phone]      Danae OrleansDeirdre C Valerye Kobus, CNM 11/23/2013  3:19 PM

## 2013-11-23 NOTE — Progress Notes (Signed)
Pt states she only feels discomfort when she walks.

## 2013-11-23 NOTE — Discharge Instructions (Signed)
Sexually Transmitted Disease °Sexually transmitted disease (STD) refers to any infection that is passed from person to person during sexual activity. This may happen by way of saliva, semen, blood, vaginal mucus, or urine. Common STDs include: °· Gonorrhea. °· Chlamydia. °· Syphilis. °· HIV/AIDS. °· Genital herpes. °· Hepatitis B and C. °· Trichomonas. °· Human papillomavirus (HPV). °· Pubic lice. °CAUSES  °An STD may be spread by bacteria, virus, or parasite. A person can get an STD by: °· Sexual intercourse with an infected person. °· Sharing sex toys with an infected person. °· Sharing needles with an infected person. °· Having intimate contact with the genitals, mouth, or rectal areas of an infected person. °SYMPTOMS  °Some people may not have any symptoms, but they can still pass the infection to others. Different STDs have different symptoms. Symptoms include: °· Painful or bloody urination. °· Pain in the pelvis, abdomen, vagina, anus, throat, or eyes. °· Skin rash, itching, irritation, growths, or sores (lesions). These usually occur in the genital or anal area. °· Abnormal vaginal discharge. °· Penile discharge in men. °· Soft, flesh-colored skin growths in the genital or anal area. °· Fever. °· Pain or bleeding during sexual intercourse. °· Swollen glands in the groin area. °· Yellow skin and eyes (jaundice). This is seen with hepatitis. °DIAGNOSIS  °To make a diagnosis, your caregiver may: °· Take a medical history. °· Perform a physical exam. °· Take a specimen (culture) to be examined. °· Examine a sample of discharge under a microscope. °· Perform blood tests. °· Perform a Pap test, if this applies. °· Perform a colposcopy. °· Perform a laparoscopy. °TREATMENT  °· Chlamydia, gonorrhea, trichomonas, and syphilis can be cured with antibiotic medicine. °· Genital herpes, hepatitis, and HIV can be treated, but not cured, with prescribed medicines. The medicines will lessen the symptoms. °· Genital warts  from HPV can be treated with medicine or by freezing, burning (electrocautery), or surgery. Warts may come back. °· HPV is a virus and cannot be cured with medicine or surgery. However, abnormal areas may be followed very closely by your caregiver and may be removed from the cervix, vagina, or vulva through office procedures or surgery. °If your diagnosis is confirmed, your recent sexual partners need treatment. This is true even if they are symptom-free or have a negative culture or evaluation. They should not have sex until their caregiver says it is okay. °HOME CARE INSTRUCTIONS °· All sexual partners should be informed, tested, and treated for all STDs. °· Take your antibiotics as directed. Finish them even if you start to feel better. °· Only take over-the-counter or prescription medicines for pain, discomfort, or fever as directed by your caregiver. °· Rest. °· Eat a balanced diet and drink enough fluids to keep your urine clear or pale yellow. °· Do not have sex until treatment is completed and you have followed up with your caregiver. STDs should be checked after treatment. °· Keep all follow-up appointments, Pap tests, and blood tests as directed by your caregiver. °· Only use latex condoms and water-soluble lubricants during sexual activity. Do not use petroleum jelly or oils. °· Avoid alcohol and illegal drugs. °· Get vaccinated for HPV and hepatitis. If you have not received these vaccines in the past, talk to your caregiver about whether one or both might be right for you. °· Avoid risky sex practices that can break the skin. °The only way to avoid getting an STD is to avoid all sexual activity. Latex condoms and dental   dams (for oral sex) will help lessen the risk of getting an STD, but will not completely eliminate the risk. SEEK MEDICAL CARE IF:   You have a fever.  You have any new or worsening symptoms. Document Released: 01/24/2003 Document Revised: 01/26/2012 Document Reviewed:  05/24/2013 [facility] Patient Information 2014 [place], [place].  Trichomoniasis Trichomoniasis is an infection, caused by the Trichomonas organism, that affects both women and men. In women, the outer female genitalia and the vagina are affected. In men, the penis is mainly affected, but the prostate and other reproductive organs can also be involved. Trichomoniasis is a sexually transmitted disease (STD) and is most often passed to another person through sexual contact. The majority of people who get trichomoniasis do so from a sexual encounter and are also at risk for other STDs. CAUSES   Sexual intercourse with an infected partner.  It can be present in swimming pools or hot tubs. SYMPTOMS   Abnormal gray-green frothy vaginal discharge in women.  Vaginal itching and irritation in women.  Itching and irritation of the area outside the vagina in women.  Penile discharge with or without pain in males.  Inflammation of the urethra (urethritis), causing painful urination.  Bleeding after sexual intercourse. RELATED COMPLICATIONS  Pelvic inflammatory disease.  Infection of the uterus (endometritis).  Infertility.  Tubal (ectopic) pregnancy.  It can be associated with other STDs, including gonorrhea and chlamydia, hepatitis B, and HIV. COMPLICATIONS DURING PREGNANCY  Early (premature) delivery.  Premature rupture of the membranes (PROM).  Low birth weight. DIAGNOSIS   Visualization of Trichomonas under the microscope from the vagina discharge.  Ph of the vagina greater than 4.5, tested with a test tape.  Trich Rapid Test.  Culture of the organism, but this is not usually needed.  It may be found on a Pap test.  Having a "strawberry cervix,"which means the cervix looks very red like a strawberry. TREATMENT   You may be given medication to fight the infection. Inform your caregiver if you could be or are pregnant. Some medications used to treat the infection should not  be taken during pregnancy.  Over-the-counter medications or creams to decrease itching or irritation may be recommended.  Your sexual partner will need to be treated if infected. HOME CARE INSTRUCTIONS   Take all medication prescribed by your caregiver.  Take over-the-counter medication for itching or irritation as directed by your caregiver.  Do not have sexual intercourse while you have the infection.  Do not douche or wear tampons.  Discuss your infection with your partner, as your partner may have acquired the infection from you. Or, your partner may have been the person who transmitted the infection to you.  Have your sex partner examined and treated if necessary.  Practice safe, informed, and protected sex.  See your caregiver for other STD testing. SEEK MEDICAL CARE IF:   You still have symptoms after you finish the medication.  You have an oral temperature above 102 F (38.9 C).  You develop belly (abdominal) pain.  You have pain when you urinate.  You have bleeding after sexual intercourse.  You develop a rash.  The medication makes you sick or makes you throw up (vomit). Document Released: 04/29/2001 Document Revised: 01/26/2012 Document Reviewed: 05/25/2009 [facility] Patient Information 2014 [place], [place].

## 2013-11-24 LAB — GC/CHLAMYDIA PROBE AMP
CT Probe RNA: POSITIVE — AB
GC Probe RNA: NEGATIVE

## 2014-09-18 ENCOUNTER — Encounter (HOSPITAL_COMMUNITY): Payer: Self-pay | Admitting: *Deleted

## 2014-10-10 ENCOUNTER — Inpatient Hospital Stay (HOSPITAL_COMMUNITY)
Admission: AD | Admit: 2014-10-10 | Discharge: 2014-10-10 | Disposition: A | Discharge status: Home / Self Care | Payer: Medicaid Other | Source: Ambulatory Visit | Attending: Obstetrics and Gynecology | Admitting: Obstetrics and Gynecology

## 2014-10-10 ENCOUNTER — Encounter (HOSPITAL_COMMUNITY): Payer: Self-pay | Admitting: *Deleted

## 2014-10-10 DIAGNOSIS — Z3202 Encounter for pregnancy test, result negative: Secondary | ICD-10-CM

## 2014-10-10 LAB — POCT PREGNANCY, URINE: PREG TEST UR: NEGATIVE

## 2014-10-10 LAB — HCG, QUANTITATIVE, PREGNANCY

## 2014-10-10 LAB — CBC
HEMATOCRIT: 37.2 % (ref 36.0–46.0)
HEMOGLOBIN: 12.6 g/dL (ref 12.0–15.0)
MCH: 30.7 pg (ref 26.0–34.0)
MCHC: 33.9 g/dL (ref 30.0–36.0)
MCV: 90.7 fL (ref 78.0–100.0)
Platelets: 259 10*3/uL (ref 150–400)
RBC: 4.1 MIL/uL (ref 3.87–5.11)
RDW: 12.5 % (ref 11.5–15.5)
WBC: 11.5 10*3/uL — AB (ref 4.0–10.5)

## 2014-10-10 LAB — ABO/RH: ABO/RH(D): O POS

## 2014-10-10 LAB — URINALYSIS, ROUTINE W REFLEX MICROSCOPIC
Bilirubin Urine: NEGATIVE
GLUCOSE, UA: NEGATIVE mg/dL
KETONES UR: NEGATIVE mg/dL
Nitrite: NEGATIVE
PH: 6 (ref 5.0–8.0)
PROTEIN: NEGATIVE mg/dL
Specific Gravity, Urine: >1.03 — ABNORMAL HIGH (ref 1.005–1.030)
Urobilinogen, UA: 0.2 mg/dL (ref 0.0–1.0)

## 2014-10-10 LAB — URINE MICROSCOPIC-ADD ON

## 2014-10-10 NOTE — MAU Note (Signed)
Pt. States that she is having pregnancy symptoms, she also has the mirena for birth control.  She pt. Also had 2 positive pregnancy test at home.

## 2014-10-10 NOTE — Discharge Instructions (Signed)
Levonorgestrel intrauterine device (IUD) What is this medicine? LEVONORGESTREL IUD (LEE voe nor jes trel) is a contraceptive (birth control) device. The device is placed inside the uterus by a healthcare professional. It is used to prevent pregnancy and can also be used to treat heavy bleeding that occurs during your period. Depending on the device, it can be used for 3 to 5 years. This medicine may be used for other purposes; ask your health care provider or pharmacist if you have questions. COMMON BRAND NAME(S): LILETTA, Mirena, Skyla What should I tell my health care provider before I take this medicine? They need to know if you have any of these conditions: -abnormal Pap smear -cancer of the breast, uterus, or cervix -diabetes -endometritis -genital or pelvic infection now or in the past -have more than one sexual partner or your partner has more than one partner -heart disease -history of an ectopic or tubal pregnancy -immune system problems -IUD in place -liver disease or tumor -problems with blood clots or take blood-thinners -use intravenous drugs -uterus of unusual shape -vaginal bleeding that has not been explained -an unusual or allergic reaction to levonorgestrel, other hormones, silicone, or polyethylene, medicines, foods, dyes, or preservatives -pregnant or trying to get pregnant -breast-feeding How should I use this medicine? This device is placed inside the uterus by a health care professional. Talk to your pediatrician regarding the use of this medicine in children. Special care may be needed. Overdosage: If you think you have taken too much of this medicine contact a poison control center or emergency room at once. NOTE: This medicine is only for you. Do not share this medicine with others. What if I miss a dose? This does not apply. What may interact with this medicine? Do not take this medicine with any of the following  medications: -amprenavir -bosentan -fosamprenavir This medicine may also interact with the following medications: -aprepitant -barbiturate medicines for inducing sleep or treating seizures -bexarotene -griseofulvin -medicines to treat seizures like carbamazepine, ethotoin, felbamate, oxcarbazepine, phenytoin, topiramate -modafinil -pioglitazone -rifabutin -rifampin -rifapentine -some medicines to treat HIV infection like atazanavir, indinavir, lopinavir, nelfinavir, tipranavir, ritonavir -St. John's wort -warfarin This list may not describe all possible interactions. Give your health care provider a list of all the medicines, herbs, non-prescription drugs, or dietary supplements you use. Also tell them if you smoke, drink alcohol, or use illegal drugs. Some items may interact with your medicine. What should I watch for while using this medicine? Visit your doctor or health care professional for regular check ups. See your doctor if you or your partner has sexual contact with others, becomes HIV positive, or gets a sexual transmitted disease. This product does not protect you against HIV infection (AIDS) or other sexually transmitted diseases. You can check the placement of the IUD yourself by reaching up to the top of your vagina with clean fingers to feel the threads. Do not pull on the threads. It is a good habit to check placement after each menstrual period. Call your doctor right away if you feel more of the IUD than just the threads or if you cannot feel the threads at all. The IUD may come out by itself. You may become pregnant if the device comes out. If you notice that the IUD has come out use a backup birth control method like condoms and call your health care provider. Using tampons will not change the position of the IUD and are okay to use during your period. What side effects may   I notice from receiving this medicine? Side effects that you should report to your doctor or  health care professional as soon as possible: -allergic reactions like skin rash, itching or hives, swelling of the face, lips, or tongue -fever, flu-like symptoms -genital sores -high blood pressure -no menstrual period for 6 weeks during use -pain, swelling, warmth in the leg -pelvic pain or tenderness -severe or sudden headache -signs of pregnancy -stomach cramping -sudden shortness of breath -trouble with balance, talking, or walking -unusual vaginal bleeding, discharge -yellowing of the eyes or skin Side effects that usually do not require medical attention (report to your doctor or health care professional if they continue or are bothersome): -acne -breast pain -change in sex drive or performance -changes in weight -cramping, dizziness, or faintness while the device is being inserted -headache -irregular menstrual bleeding within first 3 to 6 months of use -nausea This list may not describe all possible side effects. Call your doctor for medical advice about side effects. You may report side effects to FDA at 1-800-FDA-1088. Where should I keep my medicine? This does not apply. NOTE: This sheet is a summary. It may not cover all possible information. If you have questions about this medicine, talk to your doctor, pharmacist, or health care provider.  2015, [employer]/Gold Standard. (2011-12-04 13:54:04)  

## 2014-10-10 NOTE — MAU Provider Note (Signed)
History     CSN: [id number]  Arrival date and time: 10/10/14 2005   First Provider Initiated Contact with Patient 10/10/14 2213      Chief Complaint  Patient presents with  . Possible Pregnancy   HPI  Shirley Terrell is a 28 y.o. G1P1001 who presents today with pregnancy symptoms, including breast tenderness/fullness, urinary frequency and nausea for about 2-3 weeks. She took four pregnancy tests at home, and 2 were positive and 2 were negative.    Past Medical History  Diagnosis Date  . Gallstones   . Asthma     no problems since 2009  . Anemia   . Trichomonas     Past Surgical History  Procedure Laterality Date  . Cholecystectomy  04/13/2012    Procedure: LAPAROSCOPIC CHOLECYSTECTOMY WITH INTRAOPERATIVE CHOLANGIOGRAM;  Surgeon: Adolph Pollackodd J Rosenbower, MD;  [facility];  Service: General;  Laterality: N/A;  laparoscopic cholecystectomy with IOC    Family History  Problem Relation Age of Onset  . Hypertension Mother   . Stroke Mother   . Diabetes Other   . Heart disease Father   . Depression Father   . Cancer Paternal Grandmother     breast  . Diabetes Paternal Grandmother     History  Substance Use Topics  . Smoking status: Light Tobacco Smoker -- 0.25 packs/day    Types: Cigarettes  . Smokeless tobacco: Never Used  . Alcohol Use: Yes     Comment: socially    Allergies: No Known Allergies  Prescriptions prior to admission  Medication Sig Dispense Refill Last Dose  . acetaminophen (TYLENOL) 325 MG tablet Take 650 mg by mouth every 6 (six) hours as needed for headache.   10/09/2014 at Unknown time  . albuterol (PROVENTIL HFA;VENTOLIN HFA) 108 (90 BASE) MCG/ACT inhaler Inhale 2 puffs into the lungs every 6 (six) hours as needed for wheezing.    10/09/2014 at Unknown time  . IRON PO Take 1 tablet by mouth daily.   Past Week at Unknown time    ROS Physical Exam   Blood pressure 146/100, pulse 88, temperature 98.3 F (36.8 C), temperature source Oral,  height 5\' 3"  (1.6 m), last menstrual period 09/09/2014.  Physical Exam  Nursing note and vitals reviewed. Constitutional: She is oriented to person, place, and time. She appears well-developed and well-nourished. No distress.  Cardiovascular: Normal rate.   Respiratory: Effort normal.  GI: Soft. There is no tenderness. There is no rebound.  Neurological: She is alert and oriented to person, place, and time.  Skin: Skin is warm and dry.  Psychiatric: She has a normal mood and affect.    MAU Course  Procedures Results for orders placed or performed during the hospital encounter of 10/10/14 (from the past 24 hour(s))  Urinalysis, Routine w reflex microscopic     Status: Abnormal   Collection Time: 10/10/14  8:45 PM  Result Value Ref Range   Color, Urine YELLOW YELLOW   APPearance CLEAR CLEAR   Specific Gravity, Urine >1.030 (H) 1.005 - 1.030   pH 6.0 5.0 - 8.0   Glucose, UA NEGATIVE NEGATIVE mg/dL   Hgb urine dipstick LARGE (A) NEGATIVE   Bilirubin Urine NEGATIVE NEGATIVE   Ketones, ur NEGATIVE NEGATIVE mg/dL   Protein, ur NEGATIVE NEGATIVE mg/dL   Urobilinogen, UA 0.2 0.0 - 1.0 mg/dL   Nitrite NEGATIVE NEGATIVE   Leukocytes, UA MODERATE (A) NEGATIVE  Urine microscopic-add on     Status: None   Collection Time: 10/10/14  8:45 PM  Result Value Ref Range   Squamous Epithelial / LPF RARE RARE   WBC, UA 11-20 <3 WBC/hpf   RBC / HPF 11-20 <3 RBC/hpf   Bacteria, UA RARE RARE   Urine-Other MUCOUS PRESENT   Pregnancy, urine POC     Status: None   Collection Time: 10/10/14  8:58 PM  Result Value Ref Range   Preg Test, Ur NEGATIVE NEGATIVE  CBC     Status: Abnormal   Collection Time: 10/10/14  8:59 PM  Result Value Ref Range   WBC 11.5 (H) 4.0 - 10.5 K/uL   RBC 4.10 3.87 - 5.11 MIL/uL   Hemoglobin 12.6 12.0 - 15.0 g/dL   HCT 16.137.2 09.636.0 - 04.546.0 %   MCV 90.7 78.0 - 100.0 fL   MCH 30.7 26.0 - 34.0 pg   MCHC 33.9 30.0 - 36.0 g/dL   RDW 40.912.5 81.111.5 - 91.415.5 %   Platelets 259 150 - 400  K/uL  ABO/Rh     Status: None   Collection Time: 10/10/14  8:59 PM  Result Value Ref Range   ABO/RH(D) O POS   hCG, quantitative, pregnancy     Status: None   Collection Time: 10/10/14  8:59 PM  Result Value Ref Range   hCG, Beta Chain, Quant, S <1 <5 mIU/mL     Assessment and Plan   1. Encounter for pregnancy test with result negative    Return to MAU as needed  Follow-up Information    Follow up with HARPER,CHARLES A, MD.   Specialty:  Obstetrics and Gynecology   Why:  As scheduled   Contact information:   [address] [place] [address] [phone]        Tawnya CrookHogan, Heather Donovan 10/10/2014, 10:14 PM

## 2014-12-04 ENCOUNTER — Inpatient Hospital Stay (HOSPITAL_COMMUNITY)
Admission: AD | Admit: 2014-12-04 | Discharge: 2014-12-04 | Disposition: A | Discharge status: Home / Self Care | Payer: Medicaid Other | Source: Ambulatory Visit | Attending: Obstetrics | Admitting: Obstetrics

## 2014-12-04 ENCOUNTER — Encounter (HOSPITAL_COMMUNITY): Payer: Self-pay | Admitting: *Deleted

## 2014-12-04 DIAGNOSIS — N390 Urinary tract infection, site not specified: Secondary | ICD-10-CM | POA: Diagnosis not present

## 2014-12-04 DIAGNOSIS — N3001 Acute cystitis with hematuria: Secondary | ICD-10-CM

## 2014-12-04 DIAGNOSIS — N949 Unspecified condition associated with female genital organs and menstrual cycle: Secondary | ICD-10-CM | POA: Diagnosis present

## 2014-12-04 DIAGNOSIS — F17210 Nicotine dependence, cigarettes, uncomplicated: Secondary | ICD-10-CM | POA: Diagnosis not present

## 2014-12-04 DIAGNOSIS — R102 Pelvic and perineal pain: Secondary | ICD-10-CM

## 2014-12-04 LAB — URINE MICROSCOPIC-ADD ON

## 2014-12-04 LAB — WET PREP, GENITAL
Clue Cells Wet Prep HPF POC: NONE SEEN
Trich, Wet Prep: NONE SEEN
Yeast Wet Prep HPF POC: NONE SEEN

## 2014-12-04 LAB — URINALYSIS, ROUTINE W REFLEX MICROSCOPIC
BILIRUBIN URINE: NEGATIVE
Glucose, UA: NEGATIVE mg/dL
Ketones, ur: NEGATIVE mg/dL
Nitrite: NEGATIVE
PH: 6.5 (ref 5.0–8.0)
PROTEIN: NEGATIVE mg/dL
SPECIFIC GRAVITY, URINE: 1.015 (ref 1.005–1.030)
Urobilinogen, UA: 0.2 mg/dL (ref 0.0–1.0)

## 2014-12-04 LAB — POCT PREGNANCY, URINE: Preg Test, Ur: NEGATIVE

## 2014-12-04 MED ORDER — CEFADROXIL 500 MG PO CAPS: 500.0000 mg | ORAL_CAPSULE | Freq: Two times a day (BID) | ORAL | Status: DC

## 2014-12-04 NOTE — MAU Provider Note (Signed)
History     CSN: [id number]  Arrival date and time: 12/04/14 1121   None     No chief complaint on file.  HPI 29 year old G1 P1 001 who presents to the MAU with pelvic pain that started approximately 3 weeks ago. Pain is sharp and intermittent. Worse with movements. Her dressings the pain is feeling like something is poking her. Patient had an IUD placed approximately 3 years ago and has been without symptoms during this time. The patient feels that her pain is caused by the IUD strings. Movement makes the pain occurred. She has no pain at rest.  Additionally, she complains of vaginal discharge over the past week with vaginal itching. The vaginal discharge is thin, white, foul-smelling. This has been worsening. No palliating or provoking factors.  OB History    Gravida Para Term Preterm AB TAB SAB Ectopic Multiple Living   1 1 1       1       Past Medical History  Diagnosis Date  . Gallstones   . Asthma     no problems since 2009  . Anemia   . Trichomonas     Past Surgical History  Procedure Laterality Date  . Cholecystectomy  04/13/2012    Procedure: LAPAROSCOPIC CHOLECYSTECTOMY WITH INTRAOPERATIVE CHOLANGIOGRAM;  Surgeon: Adolph Pollackodd J Rosenbower, MD;  [facility];  Service: General;  Laterality: N/A;  laparoscopic cholecystectomy with IOC    Family History  Problem Relation Age of Onset  . Hypertension Mother   . Stroke Mother   . Diabetes Other   . Heart disease Father   . Depression Father   . Cancer Paternal Grandmother     breast  . Diabetes Paternal Grandmother     History  Substance Use Topics  . Smoking status: Light Tobacco Smoker -- 0.25 packs/day    Types: Cigarettes  . Smokeless tobacco: Never Used  . Alcohol Use: Yes     Comment: socially    Allergies: No Known Allergies  Prescriptions prior to admission  Medication Sig Dispense Refill Last Dose  . acetaminophen (TYLENOL) 325 MG tablet Take 650 mg by mouth every 6 (six) hours as needed for  headache.   10/09/2014 at Unknown time  . albuterol (PROVENTIL HFA;VENTOLIN HFA) 108 (90 BASE) MCG/ACT inhaler Inhale 2 puffs into the lungs every 6 (six) hours as needed for wheezing.    10/09/2014 at Unknown time  . IRON PO Take 1 tablet by mouth daily.   Past Week at Unknown time    Review of Systems  Constitutional: Negative for fever and chills.  Respiratory: Negative for cough, shortness of breath and wheezing.   Cardiovascular: Negative for chest pain and palpitations.  Gastrointestinal: Negative for heartburn, nausea, vomiting, abdominal pain, diarrhea, constipation and melena.  Genitourinary: Negative for dysuria, urgency and frequency.  Neurological: Negative for weakness.   Physical Exam   Blood pressure 144/92, pulse 97, temperature 98.1 F (36.7 C), temperature source Oral, resp. rate 16, weight 216 lb (97.977 kg), last menstrual period 11/20/2014.  Physical Exam  Constitutional: She is oriented to person, place, and time. She appears well-developed and well-nourished.  Cardiovascular: Normal rate, regular rhythm and normal heart sounds.   Respiratory: Effort normal and breath sounds normal. No respiratory distress. She has no wheezes. She has no rales. She exhibits no tenderness.  Genitourinary: There is no rash, tenderness, lesion or injury on the right labia. There is no rash, tenderness, lesion or injury on the left labia. Cervix exhibits discharge (  thin, white). Cervix exhibits no motion tenderness and no friability. Right adnexum displays no mass, no tenderness and no fullness. Left adnexum displays no mass, no tenderness and no fullness. No erythema, tenderness or bleeding in the vagina. No foreign body around the vagina. No signs of injury around the vagina. No vaginal discharge found.  Neurological: She is alert and oriented to person, place, and time.  Psychiatric: She has a normal mood and affect. Her behavior is normal. Judgment and thought content normal.   Results  for orders placed or performed during the hospital encounter of 12/04/14 (from the past 24 hour(s))  Urinalysis, Routine w reflex microscopic     Status: Abnormal   Collection Time: 12/04/14 11:35 AM  Result Value Ref Range   Color, Urine YELLOW YELLOW   APPearance CLEAR CLEAR   Specific Gravity, Urine 1.015 1.005 - 1.030   pH 6.5 5.0 - 8.0   Glucose, UA NEGATIVE NEGATIVE mg/dL   Hgb urine dipstick MODERATE (A) NEGATIVE   Bilirubin Urine NEGATIVE NEGATIVE   Ketones, ur NEGATIVE NEGATIVE mg/dL   Protein, ur NEGATIVE NEGATIVE mg/dL   Urobilinogen, UA 0.2 0.0 - 1.0 mg/dL   Nitrite NEGATIVE NEGATIVE   Leukocytes, UA LARGE (A) NEGATIVE  Urine microscopic-add on     Status: Abnormal   Collection Time: 12/04/14 11:35 AM  Result Value Ref Range   Squamous Epithelial / LPF FEW (A) RARE   WBC, UA 7-10 <3 WBC/hpf   RBC / HPF 7-10 <3 RBC/hpf   Bacteria, UA MANY (A) RARE   Urine-Other MUCOUS PRESENT   Wet prep, genital     Status: Abnormal   Collection Time: 12/04/14 12:25 PM  Result Value Ref Range   Yeast Wet Prep HPF POC NONE SEEN NONE SEEN   Trich, Wet Prep NONE SEEN NONE SEEN   Clue Cells Wet Prep HPF POC NONE SEEN NONE SEEN   WBC, Wet Prep HPF POC MANY (A) NONE SEEN  Pregnancy, urine POC     Status: None   Collection Time: 12/04/14 12:28 PM  Result Value Ref Range   Preg Test, Ur NEGATIVE NEGATIVE    MAU Course  Procedures   Assessment and Plan  1.  Pelvic Pain 2.  UTI  Strings repositioned.  Will give cefadroxil for UTI.  F/u as needed.  Tierre Netto JEHIEL 12/04/2014, 12:33 PM

## 2014-12-04 NOTE — Discharge Instructions (Signed)

## 2014-12-04 NOTE — MAU Note (Signed)
Has mirena, sometimes when she sits, she feels like the strings are poking her. Also having some pressure in lower abd. Thinks she might have a yeast infection.  Urine is dark and has noticed a discharge that is not normal for her.

## 2014-12-05 LAB — URINE CULTURE
Colony Count: >=100000
SPECIAL REQUESTS: NORMAL

## 2015-01-30 ENCOUNTER — Encounter (HOSPITAL_COMMUNITY): Payer: Self-pay

## 2015-01-30 ENCOUNTER — Inpatient Hospital Stay (HOSPITAL_COMMUNITY)
Admission: AD | Admit: 2015-01-30 | Discharge: 2015-01-30 | Disposition: A | Discharge status: Home / Self Care | Payer: Medicaid Other | Source: Ambulatory Visit | Attending: Obstetrics | Admitting: Obstetrics

## 2015-01-30 DIAGNOSIS — F17210 Nicotine dependence, cigarettes, uncomplicated: Secondary | ICD-10-CM | POA: Insufficient documentation

## 2015-01-30 DIAGNOSIS — N949 Unspecified condition associated with female genital organs and menstrual cycle: Secondary | ICD-10-CM | POA: Diagnosis not present

## 2015-01-30 DIAGNOSIS — Z202 Contact with and (suspected) exposure to infections with a predominantly sexual mode of transmission: Secondary | ICD-10-CM | POA: Diagnosis not present

## 2015-01-30 DIAGNOSIS — N898 Other specified noninflammatory disorders of vagina: Secondary | ICD-10-CM | POA: Diagnosis present

## 2015-01-30 LAB — URINALYSIS, ROUTINE W REFLEX MICROSCOPIC
BILIRUBIN URINE: NEGATIVE
Glucose, UA: NEGATIVE mg/dL
Ketones, ur: NEGATIVE mg/dL
Nitrite: NEGATIVE
PROTEIN: NEGATIVE mg/dL
Specific Gravity, Urine: 1.015 (ref 1.005–1.030)
Urobilinogen, UA: 0.2 mg/dL (ref 0.0–1.0)
pH: 7 (ref 5.0–8.0)

## 2015-01-30 LAB — URINE MICROSCOPIC-ADD ON

## 2015-01-30 LAB — WET PREP, GENITAL
Clue Cells Wet Prep HPF POC: NONE SEEN
TRICH WET PREP: NONE SEEN
YEAST WET PREP: NONE SEEN

## 2015-01-30 LAB — POCT PREGNANCY, URINE: Preg Test, Ur: NEGATIVE

## 2015-01-30 MED ORDER — CEFTRIAXONE SODIUM 250 MG IJ SOLR
250.0000 mg | Freq: Once | INTRAMUSCULAR | Status: AC
  Administered 2015-01-30: 250 mg via INTRAMUSCULAR
  Filled 2015-01-30: qty 250

## 2015-01-30 MED ORDER — AZITHROMYCIN 1 G PO PACK
1.0000 g | PACK | Freq: Once | ORAL | Status: AC
  Administered 2015-01-30: 1 g via ORAL
  Filled 2015-01-30: qty 1

## 2015-01-30 MED ORDER — IBUPROFEN 600 MG PO TABS: 600.0000 mg | ORAL_TABLET | Freq: Four times a day (QID) | ORAL | Status: DC | PRN

## 2015-01-30 NOTE — MAU Provider Note (Signed)
CC: Problems with IUD     First Provider Initiated Contact with Patient 01/30/15 1311      HPI Shirley Terrell is a 29 y.o. G1P1001 who presents stating she was informed that her sexual partner was diagnosed with gonorrhea. She has increased vaginal discharge and pelvic pain. She believes her IUD is coming out and she cannot feel the strings. No vaginal itch or irritation. Denies dysuria, hematuria, urgency, frequency. Denies abdominal pain, N/V or fever/chills. Menses light, irregular. Denies abnormal bleeding.  Past Medical History  Diagnosis Date  . Gallstones   . Asthma     no problems since 2009  . Anemia   . Trichomonas   . Bacterial vaginosis     OB History  Gravida Para Term Preterm AB SAB TAB Ectopic Multiple Living  # Outcome Date GA Lbr Len/2nd Weight Sex Delivery Anes PTL Lv  1 Term 01/14/09 [redacted]w[redacted]d   F Vag-Spont   Y      Past Surgical History  Procedure Laterality Date  . Cholecystectomy  04/13/2012    Procedure: LAPAROSCOPIC CHOLECYSTECTOMY WITH INTRAOPERATIVE CHOLANGIOGRAM;  Surgeon: Adolph Pollack, MD;  [facility];  Service: General;  Laterality: N/A;  laparoscopic cholecystectomy with IOC    History   Social History  . Marital Status: Single    Spouse Name: N/A  . Number of Children: N/A  . Years of Education: N/A   Occupational History  . Not on file.   Social History Main Topics  . Smoking status: Light Tobacco Smoker -- 0.25 packs/day    Types: Cigarettes  . Smokeless tobacco: Never Used  . Alcohol Use: No     Comment: socially  . Drug Use: No  . Sexual Activity: Yes    Birth Control/ Protection: IUD     Comment: last intercourse was 3 weeks ago   Other Topics Concern  . Not on file   Social History Narrative    No current facility-administered medications on file prior to encounter.   Current Outpatient Prescriptions on File Prior to Encounter  Medication Sig Dispense Refill  . albuterol (PROVENTIL  HFA;VENTOLIN HFA) 108 (90 BASE) MCG/ACT inhaler Inhale 2 puffs into the lungs every 6 (six) hours as needed for wheezing.     . cefadroxil (DURICEF) 500 MG capsule Take 1 capsule (500 mg total) by mouth 2 (two) times daily. 6 capsule 0  . IRON PO Take 1 tablet by mouth daily.      No Known Allergies   ROS Pertinent items in HPI   PHYSICAL EXAM Filed Vitals:   01/30/15 1242  BP: 131/96  Pulse: 94  Temp: 98.3 F (36.8 C)  Resp: 18   General: Well nourished, well developed female in no acute distress Cardiovascular: Normal rate Respiratory: Normal effort Abdomen: Soft, nontender Back: No CVAT Extremities: No edema Neurologic:grossly intact Speculum exam: NEFG; vagina with large amount mucusy discharge, no blood; cervix clean and IUD strings 3 cm from os, probed cx canal> no resistance suggestive of IUD partial expulsion. Bimanual exam: cervix closed, slight CMT; uterus NT and NSSP; no adnexal tenderness or masses noted   LAB RESULTS Results for orders placed or performed during the hospital encounter of 01/30/15 (from the past 24 hour(s))  Urinalysis, Routine w reflex microscopic     Status: Abnormal   Collection Time: 01/30/15 12:50 PM  Result Value Ref Range   Color, Urine YELLOW YELLOW  APPearance CLOUDY (A) CLEAR   Specific Gravity, Urine 1.015 1.005 - 1.030   pH 7.0 5.0 - 8.0   Glucose, UA NEGATIVE NEGATIVE mg/dL   Hgb urine dipstick MODERATE (A) NEGATIVE   Bilirubin Urine NEGATIVE NEGATIVE   Ketones, ur NEGATIVE NEGATIVE mg/dL   Protein, ur NEGATIVE NEGATIVE mg/dL   Urobilinogen, UA 0.2 0.0 - 1.0 mg/dL   Nitrite NEGATIVE NEGATIVE   Leukocytes, UA LARGE (A) NEGATIVE  Urine microscopic-add on     Status: Abnormal   Collection Time: 01/30/15 12:50 PM  Result Value Ref Range   Squamous Epithelial / LPF MANY (A) RARE   WBC, UA TOO NUMEROUS TO COUNT <3 WBC/hpf   RBC / HPF 0-2 <3 RBC/hpf   Bacteria, UA FEW (A) RARE   Urine-Other MUCOUS PRESENT   Pregnancy, urine  POC     Status: None   Collection Time: 01/30/15 12:59 PM  Result Value Ref Range   Preg Test, Ur NEGATIVE NEGATIVE  Wet prep, genital     Status: Abnormal   Collection Time: 01/30/15  1:15 PM  Result Value Ref Range   Yeast Wet Prep HPF POC NONE SEEN NONE SEEN   Trich, Wet Prep NONE SEEN NONE SEEN   Clue Cells Wet Prep HPF POC NONE SEEN NONE SEEN   WBC, Wet Prep HPF POC MODERATE (A) NONE SEEN    IMAGING No results found.  MAU COURSE Rocephin 250mg  IM and Zithromax 1 Gm po given. GC/CT culture and urine culture sent.   ASSESSMENT  1. Exposure to STD     PLAN Discharge home. See AVS for patient education. Precautions on safe sex     Medication List    STOP taking these medications        cefadroxil 500 MG capsule  Commonly known as:  DURICEF      TAKE these medications        albuterol 108 (90 BASE) MCG/ACT inhaler  Commonly known as:  PROVENTIL HFA;VENTOLIN HFA  Inhale 2 puffs into the lungs every 6 (six) hours as needed for wheezing.     ibuprofen 600 MG tablet  Commonly known as:  ADVIL,MOTRIN  Take 1 tablet (600 mg total) by mouth every 6 (six) hours as needed.     IRON PO  Take 1 tablet by mouth daily.       Follow-up Information    Follow up with HARPER,CHARLES A, MD. Schedule an appointment as soon as possible for a visit in 2 days.   Specialty:  Obstetrics and Gynecology   Why:  If symptoms worsen   Contact information:   [address] [place] [address] [phone]         Danae OrleansDeirdre C Poe, CNM 01/30/2015 1:12 PM

## 2015-01-30 NOTE — Discharge Instructions (Signed)
Gonorrhea °Gonorrhea is an infection that can cause serious problems. If left untreated, the infection may:  °· Damage the female or female organs.   °· Cause women to be unable to have children (sterility).   °· Harm a fetus if the infected woman is pregnant.   °It is important to get treatment for gonorrhea as soon as possible. It is also necessary that all your sexual partners be tested for the infection.  °CAUSES  °Gonorrhea is caused by bacteria called Neisseria gonorrhoeae. The infection is spread from person to person, usually by sexual contact (such as by anal, vaginal, or oral means). A newborn can contract the infection from his or her mother during birth.  °SYMPTOMS  °Some people with gonorrhea do not have symptoms. Symptoms may be different in females and males.  °Females  °The most common symptoms are:  °· Pain in the lower abdomen.   °· Fever with or without chills.   °Other symptoms include:  °· Abnormal vaginal discharge.   °· Painful intercourse.   °· Burning or itching of the vagina or lips of the vagina.   °· Abnormal vaginal bleeding.   °· Pain when urinating.   °· Long-lasting (chronic) pain in the lower abdomen, especially during menstruation or intercourse.   °· Inability to become pregnant.   °· Going into premature labor.   °· Irritation, pain, bleeding, or discharge from the rectum. This may occur if the infection was spread by anal sex.   °· Sore throat or swollen lymph nodes in the neck. This may occur if the infection was spread by oral sex.   °Males  °The most common symptoms are:  °· Discharge from the penis.   °· Pain or burning during urination.   °· Pain or swelling in the testicles. °Other symptoms may include:  °· Irritation, pain, bleeding, or discharge from the rectum. This may occur if the infection was spread by anal sex.   °· Sore throat, fever, or swollen lymph nodes in the neck. This may occur if the infection was spread by oral sex.   °DIAGNOSIS  °A diagnosis is made after a  physical exam is done and a sample of discharge is examined under a microscope for the presence of the bacteria. The discharge may be taken from the urethra, cervix, throat, or rectum.  °TREATMENT  °Gonorrhea is treated with antibiotic medicines. It is important for treatment to begin as soon as possible. Early treatment may prevent some problems from developing.  °HOME CARE INSTRUCTIONS  °· Take medicines only as directed by your health care provider.   °· Take your antibiotic medicine as directed by your health care provider. Finish the antibiotic even if you start to feel better. Incomplete treatment will put you at risk for continued infection.   °· Do not have sex until treatment is complete or as directed by your health care provider.   °· Keep all follow-up visits as directed by your health care provider.   °· Not all test results are available during your visit. If your test results are not back during the visit, make an appointment with your health care provider to find out the results. Do not assume everything is normal if you have not heard from your health care provider or the medical facility. It is your responsibility to get your test results. °· If you test positive for gonorrhea, inform your recent sexual partners. They need to be checked for gonorrhea even if they do not have symptoms. They may need treatment, even if they test negative for gonorrhea.   °SEEK MEDICAL CARE IF:  °· You develop any bad reaction   to the medicine you were prescribed. This may include:   A rash.   Nausea.   Vomiting.   Diarrhea.   Your symptoms do not improve after a few days of taking antibiotics.   Your symptoms get worse.   You develop increased pain, such as in the testicles (for males) or in the abdomen (for females).  You have a fever. MAKE SURE YOU:   Understand these instructions.  Will watch your condition.  Will get help right away if you are not doing well or get worse. Document  Released: 10/31/2000 Document Revised: 03/20/2014 Document Reviewed: 05/11/2013 [facility] Patient Information 2015 [place], [place]. This information is not intended to replace advice given to you by your health care provider. Make sure you discuss any questions you have with your health care provider. Safe Sex Safe sex is about reducing the risk of giving or getting a sexually transmitted disease (STD). STDs are spread through sexual contact involving the genitals, mouth, or rectum. Some STDs can be cured and others cannot. Safe sex can also prevent unintended pregnancies.  WHAT ARE SOME SAFE SEX PRACTICES?  Limit your sexual activity to only one partner who is having sex with only you.  Talk to your partner about his or her past partners, past STDs, and drug use.  Use a condom every time you have sexual intercourse. This includes vaginal, oral, and anal sexual activity. Both females and males should wear condoms during oral sex. Only use latex or polyurethane condoms and water-based lubricants. Using petroleum-based lubricants or oils to lubricate a condom will weaken the condom and increase the chance that it will break. The condom should be in place from the beginning to the end of sexual activity. Wearing a condom reduces, but does not completely eliminate, your risk of getting or giving an STD. STDs can be spread by contact with infected body fluids and skin.  Get vaccinated for hepatitis B and HPV.  Avoid alcohol and recreational drugs, which can affect your judgment. You may forget to use a condom or participate in high-risk sex.  For females, avoid douching after sexual intercourse. Douching can spread an infection farther into the reproductive tract.  Check your body for signs of sores, blisters, rashes, or unusual discharge. See your health care provider if you notice any of these signs.  Avoid sexual contact if you have symptoms of an infection or are being treated for an STD. If you or  your partner has herpes, avoid sexual contact when blisters are present. Use condoms at all other times.  If you are at risk of being infected with HIV, it is recommended that you take a prescription medicine daily to prevent HIV infection. This is called pre-exposure prophylaxis (PrEP). You are considered at risk if:  You are a man who has sex with other men (MSM).  You are a heterosexual man or woman who is sexually active with more than one partner.  You take drugs by injection.  You are sexually active with a partner who has HIV.  Talk with your health care provider about whether you are at high risk of being infected with HIV. If you choose to begin PrEP, you should first be tested for HIV. You should then be tested every 3 months for as long as you are taking PrEP.  See your health care provider for regular screenings, exams, and tests for other STDs. Before having sex with a new partner, each of you should be screened for STDs and  should talk about the results with each other. WHAT ARE THE BENEFITS OF SAFE SEX?   There is less chance of getting or giving an STD.  You can prevent unwanted or unintended pregnancies.  By discussing safe sex concerns with your partner, you may increase feelings of intimacy, comfort, trust, and honesty between the two of you. Document Released: 12/11/2004 Document Revised: 03/20/2014 Document Reviewed: 04/26/2012 [facility] Patient Information 2015 [place], [place]. This information is not intended to replace advice given to you by your health care provider. Make sure you discuss any questions you have with your health care provider.

## 2015-01-30 NOTE — MAU Note (Signed)
Pt has mirena IUD - feels as if the IUD is sliding down, feels pulling & tugging on the inside.  Unable to feel strings.  Had sexual intercourse 2 weeks ago, received phone call from a female stating this partner had trichomonas & gonorrhea.  C/O lower abd pain, has clear/yellow discharge, no bleeding.

## 2015-01-31 ENCOUNTER — Other Ambulatory Visit: Payer: Self-pay | Admitting: Obstetrics

## 2015-01-31 ENCOUNTER — Telehealth: Payer: Self-pay | Admitting: Obstetrics and Gynecology

## 2015-01-31 DIAGNOSIS — A5609 Other chlamydial infection of lower genitourinary tract: Secondary | ICD-10-CM

## 2015-01-31 DIAGNOSIS — A5403 Gonococcal cervicitis, unspecified: Secondary | ICD-10-CM

## 2015-01-31 DIAGNOSIS — Z202 Contact with and (suspected) exposure to infections with a predominantly sexual mode of transmission: Secondary | ICD-10-CM

## 2015-01-31 LAB — GC/CHLAMYDIA PROBE AMP (~~LOC~~) NOT AT ARMC
Chlamydia: POSITIVE — AB
Neisseria Gonorrhea: POSITIVE — AB

## 2015-01-31 LAB — HIV ANTIBODY (ROUTINE TESTING W REFLEX): HIV Screen 4th Generation wRfx: NONREACTIVE

## 2015-01-31 MED ORDER — CEFTRIAXONE SODIUM 250 MG IJ SOLR: 250.0000 mg | Freq: Once | INTRAMUSCULAR | Status: DC

## 2015-01-31 MED ORDER — AZITHROMYCIN 250 MG PO TABS: 1000.0000 mg | ORAL_TABLET | Freq: Once | ORAL | Status: DC

## 2015-01-31 NOTE — Telephone Encounter (Signed)
Informed that GC, CT positive. Treated in MAU yesterday. Pt has F/U with Dr. Clearance CootsHarper scheduled

## 2015-02-13 ENCOUNTER — Encounter: Payer: Self-pay | Admitting: Obstetrics

## 2015-02-13 ENCOUNTER — Ambulatory Visit (INDEPENDENT_AMBULATORY_CARE_PROVIDER_SITE_OTHER): Payer: Medicaid Other | Admitting: Obstetrics

## 2015-02-13 VITALS — BP 143/103 | HR 94 | Temp 98.7°F | Ht 63.0 in | Wt 220.0 lb

## 2015-02-13 DIAGNOSIS — R52 Pain, unspecified: Secondary | ICD-10-CM | POA: Diagnosis not present

## 2015-02-13 DIAGNOSIS — Z30432 Encounter for removal of intrauterine contraceptive device: Secondary | ICD-10-CM

## 2015-02-13 MED ORDER — OXYCODONE HCL 10 MG PO TABS: 10.0000 mg | ORAL_TABLET | Freq: Four times a day (QID) | ORAL | Status: DC | PRN

## 2015-02-13 NOTE — Progress Notes (Signed)
Subjective:    Shirley Terrell is a 29 y.o. female who presents for removal of IUD. The patient has no complaints today. The patient is not sexually active. Pertinent past medical history: GC and Chlamydia infection, treated  The information documented in the HPI was reviewed and verified.  Menstrual History: OB History    Gravida Para Term Preterm AB TAB SAB Ectopic Multiple Living   1 1 1       1        Patient's last menstrual period was 01/17/2015.   There are no active problems to display for this patient.  Past Medical History  Diagnosis Date  . Gallstones   . Asthma     no problems since 2009  . Anemia   . Trichomonas   . Bacterial vaginosis     Past Surgical History  Procedure Laterality Date  . Cholecystectomy  04/13/2012    Procedure: LAPAROSCOPIC CHOLECYSTECTOMY WITH INTRAOPERATIVE CHOLANGIOGRAM;  Surgeon: Adolph Pollackodd J Rosenbower, MD;  Location: [facility] OR;  Service: General;  Laterality: N/A;  laparoscopic cholecystectomy with IOC     Current outpatient prescriptions:  .  albuterol (PROVENTIL HFA;VENTOLIN HFA) 108 (90 BASE) MCG/ACT inhaler, Inhale 2 puffs into the lungs every 6 (six) hours as needed for wheezing. , Disp: , Rfl:  .  ibuprofen (ADVIL,MOTRIN) 600 MG tablet, Take 1 tablet (600 mg total) by mouth every 6 (six) hours as needed., Disp: 30 tablet, Rfl: 1 .  IRON PO, Take 1 tablet by mouth daily., Disp: , Rfl:  .  Oxycodone HCl 10 MG TABS, Take 1 tablet (10 mg total) by mouth every 6 (six) hours as needed., Disp: 40 tablet, Rfl: 0 No Known Allergies  History  Substance Use Topics  . Smoking status: Light Tobacco Smoker -- 0.25 packs/day    Types: Cigarettes  . Smokeless tobacco: Never Used  . Alcohol Use: No     Comment: socially    Family History  Problem Relation Age of Onset  . Hypertension Mother   . Stroke Mother   . Diabetes Other   . Heart disease Father   . Depression Father   . Cancer Paternal Grandmother     breast  . Diabetes Paternal  Grandmother   . Cancer Paternal Grandfather   . Hypertension Maternal Grandmother       Objective:   BP 143/103 mmHg  Pulse 94  Temp(Src) 98.7 F (37.1 C)  Ht 5\' 3"  (1.6 m)  Wt 220 lb (99.791 kg)  BMI 38.98 kg/m2  LMP 01/17/2015                  Abdomen:  normal findings: no organomegaly, soft, non-tender and no hernia  Pelvis:  External genitalia: normal general appearance Urinary system: urethral meatus normal and bladder without fullness, nontender Vaginal: normal without tenderness, induration or masses Cervix: normal appearance.  IUD string visible and was grasped with dressing forceps and IUD was removed, intact. Adnexa: normal bimanual exam Uterus: anteverted and non-tender, normal size   Lab Review Urine pregnancy test Labs reviewed yes Radiologic studies reviewed yes    Assessment:    29 y.o., discontinuing IUD, no contraindications.   Plan:    All questions answered. Discussed healthy lifestyle modifications. Wet prep.   F/U 3 months for GC / CT TOC cultures. Meds ordered this encounter  Medications  . Oxycodone HCl 10 MG TABS    Sig: Take 1 tablet (10 mg total) by mouth every 6 (six) hours as needed.  Dispense:  40 tablet    Refill:  0   No orders of the defined types were placed in this encounter.

## 2015-02-16 ENCOUNTER — Other Ambulatory Visit: Payer: Self-pay | Admitting: Obstetrics

## 2015-02-16 DIAGNOSIS — B9689 Other specified bacterial agents as the cause of diseases classified elsewhere: Secondary | ICD-10-CM

## 2015-02-16 DIAGNOSIS — N760 Acute vaginitis: Principal | ICD-10-CM

## 2015-02-16 DIAGNOSIS — A5901 Trichomonal vulvovaginitis: Secondary | ICD-10-CM

## 2015-02-16 LAB — SURESWAB, VAGINOSIS/VAGINITIS PLUS
Atopobium vaginae: 5.8 Log (cells/mL)
C. albicans, DNA: NOT DETECTED
C. glabrata, DNA: NOT DETECTED
C. parapsilosis, DNA: NOT DETECTED
C. trachomatis RNA, TMA: NOT DETECTED
C. tropicalis, DNA: NOT DETECTED
Gardnerella vaginalis: 6.8 Log (cells/mL)
LACTOBACILLUS SPECIES: NOT DETECTED Log (cells/mL)
MEGASPHAERA SPECIES: NOT DETECTED Log (cells/mL)
N. gonorrhoeae RNA, TMA: NOT DETECTED
T. vaginalis RNA, QL TMA: DETECTED — AB

## 2015-02-16 MED ORDER — TINIDAZOLE 500 MG PO TABS: 2.0000 g | ORAL_TABLET | Freq: Every day | ORAL | Status: DC

## 2015-05-07 ENCOUNTER — Inpatient Hospital Stay (HOSPITAL_COMMUNITY)
Admission: AD | Admit: 2015-05-07 | Discharge: 2015-05-07 | Disposition: A | Discharge status: Home / Self Care | Payer: Medicaid Other | Source: Ambulatory Visit | Attending: Obstetrics | Admitting: Obstetrics

## 2015-05-07 ENCOUNTER — Encounter (HOSPITAL_COMMUNITY): Payer: Self-pay | Admitting: *Deleted

## 2015-05-07 DIAGNOSIS — F17210 Nicotine dependence, cigarettes, uncomplicated: Secondary | ICD-10-CM | POA: Insufficient documentation

## 2015-05-07 DIAGNOSIS — R1011 Right upper quadrant pain: Secondary | ICD-10-CM | POA: Diagnosis not present

## 2015-05-07 DIAGNOSIS — I10 Essential (primary) hypertension: Secondary | ICD-10-CM | POA: Insufficient documentation

## 2015-05-07 DIAGNOSIS — R109 Unspecified abdominal pain: Secondary | ICD-10-CM | POA: Diagnosis present

## 2015-05-07 LAB — WET PREP, GENITAL
Trich, Wet Prep: NONE SEEN
Yeast Wet Prep HPF POC: NONE SEEN

## 2015-05-07 LAB — CBC
HCT: 38.8 % (ref 36.0–46.0)
Hemoglobin: 13.3 g/dL (ref 12.0–15.0)
MCH: 30.7 pg (ref 26.0–34.0)
MCHC: 34.3 g/dL (ref 30.0–36.0)
MCV: 89.6 fL (ref 78.0–100.0)
Platelets: 259 10*3/uL (ref 150–400)
RBC: 4.33 MIL/uL (ref 3.87–5.11)
RDW: 12.2 % (ref 11.5–15.5)
WBC: 8.6 10*3/uL (ref 4.0–10.5)

## 2015-05-07 LAB — URINALYSIS, ROUTINE W REFLEX MICROSCOPIC
BILIRUBIN URINE: NEGATIVE
GLUCOSE, UA: NEGATIVE mg/dL
KETONES UR: NEGATIVE mg/dL
NITRITE: NEGATIVE
PH: 6 (ref 5.0–8.0)
Protein, ur: NEGATIVE mg/dL
Specific Gravity, Urine: <1.005 — ABNORMAL LOW (ref 1.005–1.030)
Urobilinogen, UA: 0.2 mg/dL (ref 0.0–1.0)

## 2015-05-07 LAB — URINE MICROSCOPIC-ADD ON

## 2015-05-07 LAB — LIPASE, BLOOD: LIPASE: 13 U/L — AB (ref 22–51)

## 2015-05-07 LAB — POCT PREGNANCY, URINE: Preg Test, Ur: NEGATIVE

## 2015-05-07 LAB — HCG, QUANTITATIVE, PREGNANCY

## 2015-05-07 LAB — AMYLASE: Amylase: 52 U/L (ref 28–100)

## 2015-05-07 NOTE — MAU Provider Note (Signed)
History     CSN: [id number]  Arrival date and time: 05/07/15 0946   None     Chief Complaint  Patient presents with  . Possible Pregnancy  . Abdominal Pain   HPI  Pt is non pregnant G1P1 with inconclusive positive home pregnancy tests- Pt c/o of RUQ pain-pt has hx of gallstones with cholecystectomy 04/13/2012-pain in similar_ pt had Mirena removed in April and had period and had spotting 3 days 10-11.Pt denies constipation, diarrhea, UTI sx. Pt has had nausea and vomiting. Pt last ate yesterday at 5 pm- pt has been throwing up since 5 pm. Pt has not taken anything for pain.  The pain is worse after eating.  Pt's pain has been going on for several months, but pt is concerned about her questionable pregnancy test. Pt denies vaginal discharge or spotting/bleeding since May Pt has been drinking water and gatorade. Pt last had IC June 8.  Pt has been using condoms for contraception. RN note: RN Registered Nurse Signed  MAU Note 05/07/2015 9:52 AM    Expand All Collapse All   Pain in rt upper side started on Friday, below ribs. Pain comes and goes. Has had some abd cramping. 6 preg tests: 2 positive, 2 negative, 2 inconclusive. Mirena removed first wk in April. Has had nausea and vomiting        Past Medical History  Diagnosis Date  . Gallstones   . Asthma     no problems since 2009  . Anemia   . Trichomonas   . Bacterial vaginosis     Past Surgical History  Procedure Laterality Date  . Cholecystectomy  04/13/2012    Procedure: LAPAROSCOPIC CHOLECYSTECTOMY WITH INTRAOPERATIVE CHOLANGIOGRAM;  Surgeon: Adolph Pollack, MD;  Location: [facility] OR;  Service: General;  Laterality: N/A;  laparoscopic cholecystectomy with IOC    Family History  Problem Relation Age of Onset  . Hypertension Mother   . Stroke Mother   . Diabetes Other   . Heart disease Father   . Depression Father   . Cancer Paternal Grandmother     breast  . Diabetes Paternal Grandmother   . Cancer Paternal  Grandfather   . Hypertension Maternal Grandmother     History  Substance Use Topics  . Smoking status: Current Every Day Smoker -- 0.25 packs/day    Types: Cigarettes  . Smokeless tobacco: Never Used  . Alcohol Use: No     Comment: socially    Allergies: No Known Allergies  Prescriptions prior to admission  Medication Sig Dispense Refill Last Dose  . albuterol (PROVENTIL HFA;VENTOLIN HFA) 108 (90 BASE) MCG/ACT inhaler Inhale 2 puffs into the lungs every 6 (six) hours as needed for wheezing.    Taking  . ibuprofen (ADVIL,MOTRIN) 600 MG tablet Take 1 tablet (600 mg total) by mouth every 6 (six) hours as needed. 30 tablet 1 Taking  . IRON PO Take 1 tablet by mouth daily.   Taking  . Oxycodone HCl 10 MG TABS Take 1 tablet (10 mg total) by mouth every 6 (six) hours as needed. 40 tablet 0   . tinidazole (TINDAMAX) 500 MG tablet Take 4 tablets (2,000 mg total) by mouth daily with breakfast. 8 tablet 0     Review of Systems  Constitutional: Negative for fever and chills.  Gastrointestinal: Positive for nausea, vomiting and abdominal pain. Negative for diarrhea and constipation.  Musculoskeletal: Positive for back pain.  Neurological: Negative for dizziness and headaches.   Physical Exam   Blood  pressure 132/95, pulse 88, temperature 98.6 F (37 C), temperature source Oral, resp. rate 18, height 5' 2.5" (1.588 m), weight 216 lb (97.977 kg), last menstrual period 02/20/2015.  Physical Exam  Nursing note and vitals reviewed. Constitutional: She is oriented to person, place, and time. She appears well-developed and well-nourished. No distress.  HENT:  Head: Normocephalic.  Eyes: Conjunctivae are normal. Pupils are equal, round, and reactive to light.  Neck: Normal range of motion. Neck supple.  Cardiovascular: Normal rate.   Respiratory: Effort normal.  GI: Soft.  Genitourinary:  Vagina clean, pale pink, moist; cervix clean NT; uterus NSSC NT; adnexa without palpable enlargement or  tenderness  Musculoskeletal: Normal range of motion.  Neurological: She is alert and oriented to person, place, and time.  Skin: Skin is warm and dry.  Psychiatric: She has a normal mood and affect.    MAU Course  Procedures Results for orders placed or performed during the hospital encounter of 05/07/15 (from the past 24 hour(s))  Urinalysis, Routine w reflex microscopic (not at [facility])     Status: Abnormal   Collection Time: 05/07/15 10:00 AM  Result Value Ref Range   Color, Urine YELLOW YELLOW   APPearance CLEAR CLEAR   Specific Gravity, Urine <1.005 (L) 1.005 - 1.030   pH 6.0 5.0 - 8.0   Glucose, UA NEGATIVE NEGATIVE mg/dL   Hgb urine dipstick SMALL (A) NEGATIVE   Bilirubin Urine NEGATIVE NEGATIVE   Ketones, ur NEGATIVE NEGATIVE mg/dL   Protein, ur NEGATIVE NEGATIVE mg/dL   Urobilinogen, UA 0.2 0.0 - 1.0 mg/dL   Nitrite NEGATIVE NEGATIVE   Leukocytes, UA MODERATE (A) NEGATIVE  Urine microscopic-add on     Status: None   Collection Time: 05/07/15 10:00 AM  Result Value Ref Range   Squamous Epithelial / LPF RARE RARE   WBC, UA 3-6 <3 WBC/hpf   Bacteria, UA RARE RARE  Pregnancy, urine POC     Status: None   Collection Time: 05/07/15 10:06 AM  Result Value Ref Range   Preg Test, Ur NEGATIVE NEGATIVE  CBC     Status: None   Collection Time: 05/07/15 10:29 AM  Result Value Ref Range   WBC 8.6 4.0 - 10.5 K/uL   RBC 4.33 3.87 - 5.11 MIL/uL   Hemoglobin 13.3 12.0 - 15.0 g/dL   HCT 14.7 82.9 - 56.2 %   MCV 89.6 78.0 - 100.0 fL   MCH 30.7 26.0 - 34.0 pg   MCHC 34.3 30.0 - 36.0 g/dL   RDW 13.0 86.5 - 78.4 %   Platelets 259 150 - 400 K/uL  hCG, quantitative, pregnancy     Status: None   Collection Time: 05/07/15 10:29 AM  Result Value Ref Range   hCG, Beta Chain, Quant, S <1 <5 mIU/mL  Lipase, blood     Status: Abnormal   Collection Time: 05/07/15 10:29 AM  Result Value Ref Range   Lipase 13 (L) 22 - 51 U/L  Amylase     Status: None   Collection Time: 05/07/15 10:29 AM   Result Value Ref Range   Amylase 52 28 - 100 U/L  Wet prep, genital     Status: Abnormal   Collection Time: 05/07/15 11:05 AM  Result Value Ref Range   Yeast Wet Prep HPF POC NONE SEEN NONE SEEN   Trich, Wet Prep NONE SEEN NONE SEEN   Clue Cells Wet Prep HPF POC FEW (A) NONE SEEN   WBC, Wet Prep HPF POC MODERATE (A)  NONE SEEN  GC/chlamydia pending  Assessment and Plan  Abdominal pain ?GERD- Rx phenergan and diet recommendations Chronic hypertension- pt to follow up with PCP Sheneika Walstad 05/07/2015, 10:26 AM

## 2015-05-07 NOTE — MAU Note (Signed)
Pain in rt upper side started on Friday, below ribs.  Pain comes and goes.  Has had some abd cramping.  6 preg tests: 2 positive, 2 negative, 2 inconclusive.  Mirena removed first wk in April.  Has had nausea and vomiting

## 2015-05-08 ENCOUNTER — Other Ambulatory Visit: Payer: Self-pay | Admitting: Obstetrics

## 2015-05-08 DIAGNOSIS — A549 Gonococcal infection, unspecified: Secondary | ICD-10-CM

## 2015-05-08 LAB — GC/CHLAMYDIA PROBE AMP (~~LOC~~) NOT AT ARMC
Chlamydia: NEGATIVE
Neisseria Gonorrhea: POSITIVE — AB

## 2015-05-08 LAB — RPR: RPR Ser Ql: NONREACTIVE

## 2015-05-08 LAB — HIV ANTIBODY (ROUTINE TESTING W REFLEX): HIV Screen 4th Generation wRfx: NONREACTIVE

## 2015-05-08 MED ORDER — CEFTRIAXONE SODIUM 250 MG IJ SOLR: 250.0000 mg | Freq: Once | INTRAMUSCULAR | Status: DC

## 2015-05-08 MED ORDER — AZITHROMYCIN 250 MG PO TABS: 1000.0000 mg | ORAL_TABLET | Freq: Once | ORAL | Status: DC

## 2015-05-09 ENCOUNTER — Telehealth: Payer: Self-pay

## 2015-05-09 ENCOUNTER — Ambulatory Visit: Payer: Medicaid Other

## 2015-05-09 VITALS — BP 138/102 | HR 100 | Temp 98.3°F | Wt 218.0 lb

## 2015-05-09 DIAGNOSIS — I10 Essential (primary) hypertension: Secondary | ICD-10-CM

## 2015-05-09 DIAGNOSIS — Z7689 Persons encountering health services in other specified circumstances: Secondary | ICD-10-CM

## 2015-05-09 DIAGNOSIS — A549 Gonococcal infection, unspecified: Secondary | ICD-10-CM

## 2015-05-09 MED ORDER — HYDROCHLOROTHIAZIDE 25 MG PO TABS: 25.0000 mg | ORAL_TABLET | Freq: Every day | ORAL | Status: DC

## 2015-05-09 NOTE — Telephone Encounter (Signed)
told [name]'s office would call her directly for appt - NDM will also call her directly for appt.

## 2015-05-10 NOTE — Progress Notes (Unsigned)
Pt is in office for Rocephin injection for +GC.  Pt states that she has also picked up Rx at pharmacy and has taken medication.   Pt states that she was recently seen at ED for pain.  Pt states that they noted increase BP but pt was not given any treatment/rx at time of visit.  Pt is concerned about her BP being elevated.  Pt states that she is awaiting a call from a Primary Care office to get new pt appt. Chart was reviewed with Felisa Bonier, CNM.  HCTZ 25mg  qAM was ordered, Nutrintion consult and referral for PCP were entered as well.   Pt states that she has f/u appt scheduled for next week.  Pt was advised to keep that appt in order to check BP as well as any need to f/u with provider.   Pt states understanding.  BP 138/102 mmHg  Pulse 100  Temp(Src) 98.3 F (36.8 C)  Wt 218 lb (98.884 kg)  LMP 04/01/2015  Administrations This Visit    cefTRIAXone (ROCEPHIN) injection 250 mg    Admin Date Action Dose Route Administered By         05/09/2015 Given 250 mg Intramuscular Lanney Gins, CMA

## 2015-05-11 MED ORDER — CEFTRIAXONE SODIUM 1 G IJ SOLR
250.0000 mg | Freq: Once | INTRAMUSCULAR | Status: AC
  Administered 2015-05-09: 250 mg via INTRAMUSCULAR

## 2015-05-16 ENCOUNTER — Ambulatory Visit (INDEPENDENT_AMBULATORY_CARE_PROVIDER_SITE_OTHER): Payer: Medicaid Other | Admitting: Obstetrics

## 2015-05-16 ENCOUNTER — Encounter: Payer: Self-pay | Admitting: Obstetrics

## 2015-05-16 VITALS — BP 123/90 | HR 92 | Temp 97.5°F | Wt 213.0 lb

## 2015-05-16 DIAGNOSIS — I10 Essential (primary) hypertension: Secondary | ICD-10-CM | POA: Diagnosis not present

## 2015-05-16 DIAGNOSIS — Z202 Contact with and (suspected) exposure to infections with a predominantly sexual mode of transmission: Secondary | ICD-10-CM

## 2015-05-16 MED ORDER — CARVEDILOL 12.5 MG PO TABS: 12.5000 mg | ORAL_TABLET | Freq: Two times a day (BID) | ORAL | Status: DC

## 2015-05-16 NOTE — Progress Notes (Signed)
Patient ID: Shirley Terrell, female   DOB: 07/15/1986, 29 y.o.   MRN: [id number]  Chief Complaint  Patient presents with  . Follow-up    recent std treatment,  increase BP    HPI Shirley Terrell is a 29 y.o. female.  Presents for F/U from STD treatment and from BP treatment.  HPI  Past Medical History  Diagnosis Date  . Gallstones   . Asthma     no problems since 2009  . Anemia   . Trichomonas   . Bacterial vaginosis     Past Surgical History  Procedure Laterality Date  . Cholecystectomy  04/13/2012    Procedure: LAPAROSCOPIC CHOLECYSTECTOMY WITH INTRAOPERATIVE CHOLANGIOGRAM;  Surgeon: Adolph Pollack, MD;  Location: [facility] OR;  Service: General;  Laterality: N/A;  laparoscopic cholecystectomy with IOC    Family History  Problem Relation Age of Onset  . Hypertension Mother   . Stroke Mother   . Diabetes Other   . Heart disease Father   . Depression Father   . Cancer Paternal Grandmother     breast  . Diabetes Paternal Grandmother   . Cancer Paternal Grandfather   . Hypertension Maternal Grandmother     Social History History  Substance Use Topics  . Smoking status: Current Every Day Smoker -- 0.25 packs/day    Types: Cigarettes  . Smokeless tobacco: Never Used  . Alcohol Use: No     Comment: socially    No Known Allergies  Current Outpatient Prescriptions  Medication Sig Dispense Refill  . albuterol (PROVENTIL HFA;VENTOLIN HFA) 108 (90 BASE) MCG/ACT inhaler Inhale 2 puffs into the lungs every 6 (six) hours as needed for wheezing.     . hydrochlorothiazide (HYDRODIURIL) 25 MG tablet Take 1 tablet (25 mg total) by mouth daily. 30 tablet 12  . IRON PO Take 1 tablet by mouth daily.    Marland Kitchen azithromycin (ZITHROMAX) 250 MG tablet Take 4 tablets (1,000 mg total) by mouth once. (Patient not taking: Reported on 05/16/2015) 4 tablet 0  . carvedilol (COREG) 12.5 MG tablet Take 1 tablet (12.5 mg total) by mouth 2 (two) times daily with a meal. 60 tablet 11  . cefTRIAXone  (ROCEPHIN) 250 MG injection Inject 250 mg into the muscle once.  FOR IM use in LARGE MUSCLE MASS (Patient not taking: Reported on 05/16/2015) 1 each 0  . ibuprofen (ADVIL,MOTRIN) 600 MG tablet Take 1 tablet (600 mg total) by mouth every 6 (six) hours as needed. (Patient not taking: Reported on 05/16/2015) 30 tablet 1   No current facility-administered medications for this visit.    Review of Systems Review of Systems Constitutional: negative for fatigue and weight loss Respiratory: negative for cough and wheezing Cardiovascular: negative for chest pain, fatigue and palpitations Gastrointestinal: negative for abdominal pain and change in bowel habits Genitourinary:negative Integument/breast: negative for nipple discharge Musculoskeletal:negative for myalgias Neurological: negative for gait problems and tremors Behavioral/Psych: negative for abusive relationship, depression Endocrine: negative for temperature intolerance     Blood pressure 123/90, pulse 92, temperature 97.5 F (36.4 C), weight 213 lb (96.616 kg), last menstrual period 05/09/2015.  Physical Exam:  Deferred  Physical Exam100% of 10 min visit spent on counseling and coordination of care.   Data Reviewed STD cultures BP measurements  Assessment     Recent STD exposure and treatment. Hypertension.  Recently started on BP meds.     Plan    F/U in 3 months for TOC cultures Coreg added to BP treatment regimen F/U  with PCP for BP and general medical care   No orders of the defined types were placed in this encounter.   Meds ordered this encounter  Medications  . carvedilol (COREG) 12.5 MG tablet    Sig: Take 1 tablet (12.5 mg total) by mouth 2 (two) times daily with a meal.    Dispense:  60 tablet    Refill:  11

## 2015-06-18 ENCOUNTER — Encounter: Payer: Self-pay | Admitting: Skilled Nursing Facility1

## 2015-06-18 ENCOUNTER — Encounter: Payer: Medicaid Other | Attending: Specialist | Admitting: Skilled Nursing Facility1

## 2015-06-18 VITALS — Ht 63.0 in | Wt 216.0 lb

## 2015-06-18 DIAGNOSIS — I10 Essential (primary) hypertension: Secondary | ICD-10-CM | POA: Diagnosis present

## 2015-06-18 DIAGNOSIS — Z720 Tobacco use: Secondary | ICD-10-CM | POA: Insufficient documentation

## 2015-06-18 DIAGNOSIS — Z713 Dietary counseling and surveillance: Secondary | ICD-10-CM | POA: Diagnosis not present

## 2015-06-18 NOTE — Patient Instructions (Signed)
-  Aim to eat three meals a day and 2-3 snacks every day -Breakfast ideas: english muffin sand which, cold cereal with fruit and a boiled egg on the side, and eggs, [place] bacon, and grits -Lunch ideas: salad-as much veggies as you can get with tuna and then crackers or a slice of bread; pita pocket: hummus, cucumber, ckicken, green pepper, and fresh spinach; personal pizza: white pizza with broccoli -Dinner: baked salmon, brown rice, and broccoli; baked chicken, light alfredo with whole wheat pasta (as a side not the entree), and garden salad; 93% lean ground beef or ground [place] loaf with tomato sauce, roasted potatoes, and green beans  -Honor your body and listen to your hunger/fullness cues -Add thirty minutes of physical activity every day -Try to cut down to one soda and one tea OR lemonade

## 2015-06-18 NOTE — Progress Notes (Signed)
  Medical Nutrition Therapy:  Appt start time: 9:15 end time:  10:15.   Assessment:  Primary concerns today: referred for hypertension. Pt states her physician tells her her blood pressure is extremely high for her age so she wants to control that without medication. Pt states she sleeps well at least 8 hours. Pt states she eats at the table or in the living room. Pt states she has always been borderline hypertensive until recently when she was dx with hypertension. Pt states her usual wt as an adult is around 205 pounds but was 230 pounds a few years ago after her gallbladder surgery she lost the wt. Pt states she cannot tolerate greasy/oily foods.    Preferred Learning Style:   Auditory Learning Readiness:  Contemplating  MEDICATIONS: See List   DIETARY INTAKE:  Usual eating pattern includes 1 meals and 0 snacks per day.  Everyday foods include none stated.  Avoided foods include greasy/oily foods.    24-hr recall:  B ( AM): none Snk ( AM): none L ( PM): fast food (big city pizza) Snk ( PM): none D ( PM): none Snk ( PM): none Beverages: soda, water, tea, lemonade  Usual physical activity: Walk to work-15 minutes  Estimated energy needs: 1800 calories 200 g carbohydrates 135 g protein 50 g fat  Progress Towards Goal(s):  In progress.   Nutritional Diagnosis:  NB-1.1 Food and nutrition-related knowledge deficit As related to no prior nutrition education from a nutrition professional.  As evidenced by pt report, 24 hour recall, hypertensive, BMI 38.26.    Intervention:  Nutrition counseling for HTN. Dietitian educated the pt on the importance of physical acitivity, 3 meals a day and 2-3 snacks, varied/balanced meals.snacks, sodium and blood pressure, and risk factors for HTN.  Goals: -Aim to eat three meals a day and 2-3 snacks every day -Breakfast ideas: english muffin sand which, cold cereal with fruit and a boiled egg on the side, and eggs, [place] bacon, and  grits -Lunch ideas: salad-as much veggies as you can get with tuna and then crackers or a slice of bread; pita pocket: hummus, cucumber, ckicken, green pepper, and fresh spinach; personal pizza: white pizza with broccoli -Dinner: baked salmon, brown rice, and broccoli; baked chicken, light alfredo with whole wheat pasta (as a side not the entree), and garden salad; 93% lean ground beef or ground [place] loaf with tomato sauce, roasted potatoes, and green beans  -Honor your body and listen to your hunger/fullness cues -Add thirty minutes of physical activity every day -Try to cut down to one soda and one tea OR lemonade  Teaching Method Utilized:  Visual Auditory  Handouts given during visit include:  Snack sheet  HTN nutrition handout  HTN shopping tips handout  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

## 2015-08-16 ENCOUNTER — Ambulatory Visit: Payer: Medicaid Other | Admitting: Obstetrics

## 2015-08-20 ENCOUNTER — Ambulatory Visit: Payer: Medicaid Other | Admitting: Skilled Nursing Facility1

## 2015-10-17 ENCOUNTER — Encounter (HOSPITAL_COMMUNITY): Payer: Self-pay | Admitting: Emergency Medicine

## 2015-10-17 ENCOUNTER — Emergency Department (HOSPITAL_COMMUNITY)
Admission: EM | Admit: 2015-10-17 | Discharge: 2015-10-17 | Discharge status: Against Medical Advice | Payer: Medicaid Other | Attending: Emergency Medicine | Admitting: Emergency Medicine

## 2015-10-17 DIAGNOSIS — R42 Dizziness and giddiness: Secondary | ICD-10-CM | POA: Diagnosis not present

## 2015-10-17 DIAGNOSIS — I10 Essential (primary) hypertension: Secondary | ICD-10-CM | POA: Insufficient documentation

## 2015-10-17 DIAGNOSIS — R51 Headache: Secondary | ICD-10-CM | POA: Insufficient documentation

## 2015-10-17 DIAGNOSIS — F17210 Nicotine dependence, cigarettes, uncomplicated: Secondary | ICD-10-CM | POA: Diagnosis not present

## 2015-10-17 DIAGNOSIS — H538 Other visual disturbances: Secondary | ICD-10-CM | POA: Diagnosis not present

## 2015-10-17 DIAGNOSIS — J45909 Unspecified asthma, uncomplicated: Secondary | ICD-10-CM | POA: Diagnosis not present

## 2015-10-17 HISTORY — DX: Essential (primary) hypertension: I10

## 2015-10-17 NOTE — ED Notes (Signed)
Attempted to call on pt x 2 with no response

## 2015-10-17 NOTE — ED Notes (Signed)
Attempted to call for pt in lobby x 1.  No response.

## 2015-10-17 NOTE — ED Notes (Signed)
Per patient, had a real terrible headache, nothing seems to help, pt also c/o dizziness and blurred vision. Pt ambulatory in triage room with steady gait. Grip strength equal, no nubness or tingling, denies weakness. No facial droop or arm drift. Pain is 10/10.

## 2015-11-18 NOTE — L&D Delivery Note (Signed)
Patient is 30 y.o. G2P1001 [id number] admitted for IOL for cHTN   Delivery Note At 9:45 PM a viable female was delivered via Vaginal, Spontaneous Delivery (Presentation: LOA ).  APGAR: 8 ,9 ; weight pending.   Placenta status: intact.  Cord: 3 vessel. Without complications  Anesthesia:  epidural Episiotomy:  none Lacerations:  1st degree L periurethral Suture Repair: none Est. Blood Loss (mL):  100  Mom to postpartum.  Baby to Couplet care / Skin to Skin.  Leland HerElsia J Yoo 07/22/2016, 9:54 PM      Upon arrival patient was complete and pushing. She pushed with good maternal effort to deliver a healthy baby girl. Baby delivered without difficulty, was noted to have good tone and place on maternal abdomen for oral suctioning, drying and stimulation. Delayed cord clamping performed. Placenta delivered intact with 3V cord. Vaginal canal and perineum was inspected and no repair deemed necessary; hemostatic. Pitocin was started and uterus massaged until bleeding slowed. Counts of sharps, instruments, and lap pads were all correct.   Leland HerElsia J Yoo, DO PGY-1 9/5/20179:54 PM   OB FELLOW DELIVERY ATTESTATION  I was gloved and present for the delivery in its entirety, and I agree with the above resident's note.    Ernestina PennaNicholas Schenk, MD 11:04 PM

## 2015-11-26 ENCOUNTER — Inpatient Hospital Stay (HOSPITAL_COMMUNITY): Payer: Medicaid Other

## 2015-11-26 ENCOUNTER — Encounter (HOSPITAL_COMMUNITY): Payer: Self-pay

## 2015-11-26 ENCOUNTER — Inpatient Hospital Stay (HOSPITAL_COMMUNITY)
Admission: AD | Admit: 2015-11-26 | Discharge: 2015-11-26 | Disposition: A | Discharge status: Home / Self Care | Payer: Medicaid Other | Source: Ambulatory Visit | Attending: Obstetrics | Admitting: Obstetrics

## 2015-11-26 DIAGNOSIS — O9989 Other specified diseases and conditions complicating pregnancy, childbirth and the puerperium: Secondary | ICD-10-CM

## 2015-11-26 DIAGNOSIS — O23591 Infection of other part of genital tract in pregnancy, first trimester: Secondary | ICD-10-CM | POA: Insufficient documentation

## 2015-11-26 DIAGNOSIS — B9689 Other specified bacterial agents as the cause of diseases classified elsewhere: Secondary | ICD-10-CM

## 2015-11-26 DIAGNOSIS — R109 Unspecified abdominal pain: Secondary | ICD-10-CM | POA: Diagnosis not present

## 2015-11-26 DIAGNOSIS — Z9049 Acquired absence of other specified parts of digestive tract: Secondary | ICD-10-CM | POA: Insufficient documentation

## 2015-11-26 DIAGNOSIS — O26891 Other specified pregnancy related conditions, first trimester: Secondary | ICD-10-CM | POA: Insufficient documentation

## 2015-11-26 DIAGNOSIS — O26899 Other specified pregnancy related conditions, unspecified trimester: Secondary | ICD-10-CM

## 2015-11-26 DIAGNOSIS — O3680X0 Pregnancy with inconclusive fetal viability, not applicable or unspecified: Secondary | ICD-10-CM

## 2015-11-26 DIAGNOSIS — I10 Essential (primary) hypertension: Secondary | ICD-10-CM | POA: Diagnosis not present

## 2015-11-26 DIAGNOSIS — J45909 Unspecified asthma, uncomplicated: Secondary | ICD-10-CM | POA: Insufficient documentation

## 2015-11-26 DIAGNOSIS — O99331 Smoking (tobacco) complicating pregnancy, first trimester: Secondary | ICD-10-CM | POA: Diagnosis not present

## 2015-11-26 DIAGNOSIS — Z3A01 Less than 8 weeks gestation of pregnancy: Secondary | ICD-10-CM | POA: Diagnosis not present

## 2015-11-26 DIAGNOSIS — N760 Acute vaginitis: Secondary | ICD-10-CM

## 2015-11-26 LAB — HCG, QUANTITATIVE, PREGNANCY: hCG, Beta Chain, Quant, S: 3733 m[IU]/mL — ABNORMAL HIGH (ref <5)

## 2015-11-26 LAB — URINALYSIS, ROUTINE W REFLEX MICROSCOPIC
Bilirubin Urine: NEGATIVE
GLUCOSE, UA: NEGATIVE mg/dL
Ketones, ur: NEGATIVE mg/dL
Nitrite: NEGATIVE
PH: 6 (ref 5.0–8.0)
Protein, ur: NEGATIVE mg/dL
Specific Gravity, Urine: 1.01 (ref 1.005–1.030)

## 2015-11-26 LAB — URINE MICROSCOPIC-ADD ON

## 2015-11-26 LAB — WET PREP, GENITAL
SPERM: NONE SEEN
TRICH WET PREP: NONE SEEN
Yeast Wet Prep HPF POC: NONE SEEN

## 2015-11-26 LAB — ABO/RH: ABO/RH(D): O POS

## 2015-11-26 LAB — CBC
HCT: 37.3 % (ref 36.0–46.0)
Hemoglobin: 12.3 g/dL (ref 12.0–15.0)
MCH: 29.7 pg (ref 26.0–34.0)
MCHC: 33 g/dL (ref 30.0–36.0)
MCV: 90.1 fL (ref 78.0–100.0)
Platelets: 249 10*3/uL (ref 150–400)
RBC: 4.14 MIL/uL (ref 3.87–5.11)
RDW: 13 % (ref 11.5–15.5)
WBC: 9.4 10*3/uL (ref 4.0–10.5)

## 2015-11-26 LAB — POCT PREGNANCY, URINE: Preg Test, Ur: POSITIVE — AB

## 2015-11-26 MED ORDER — PROMETHAZINE HCL 25 MG PO TABS: 25.0000 mg | ORAL_TABLET | Freq: Four times a day (QID) | ORAL | Status: DC | PRN

## 2015-11-26 MED ORDER — METRONIDAZOLE 500 MG PO TABS: 500.0000 mg | ORAL_TABLET | Freq: Two times a day (BID) | ORAL | Status: DC

## 2015-11-26 NOTE — MAU Note (Signed)
Pt C/O RLQ pain for the past week, has gotten progressively worse, thinks she may be pregnant.  Has taken 4 HPT's - one was negative, one was inconclusive, two were positive.  Denies bleeding or discharge.

## 2015-11-26 NOTE — Discharge Instructions (Signed)
Abdominal Pain During Pregnancy °Abdominal pain is common in pregnancy. Most of the time, it does not cause harm. There are many causes of abdominal pain. Some causes are more serious than others. Some of the causes of abdominal pain in pregnancy are easily diagnosed. Occasionally, the diagnosis takes time to understand. Other times, the cause is not determined. Abdominal pain can be a sign that something is very wrong with the pregnancy, or the pain may have nothing to do with the pregnancy at all. For this reason, always tell your health care provider if you have any abdominal discomfort. °HOME CARE INSTRUCTIONS  °Monitor your abdominal pain for any changes. The following actions may help to alleviate any discomfort you are experiencing: °· Do not have sexual intercourse or put anything in your vagina until your symptoms go away completely. °· Get plenty of rest until your pain improves. °· Drink clear fluids if you feel nauseous. Avoid solid food as long as you are uncomfortable or nauseous. °· Only take over-the-counter or prescription medicine as directed by your health care provider. °· Keep all follow-up appointments with your health care provider. °SEEK IMMEDIATE MEDICAL CARE IF: °· You are bleeding, leaking fluid, or passing tissue from the vagina. °· You have increasing pain or cramping. °· You have persistent vomiting. °· You have painful or bloody urination. °· You have a fever. °· You notice a decrease in your baby's movements. °· You have extreme weakness or feel faint. °· You have shortness of breath, with or without abdominal pain. °· You develop a severe headache with abdominal pain. °· You have abnormal vaginal discharge with abdominal pain. °· You have persistent diarrhea. °· You have abdominal pain that continues even after rest, or gets worse. °MAKE SURE YOU:  °· Understand these instructions. °· Will watch your condition. °· Will get help right away if you are not doing well or get worse. °    °This information is not intended to replace advice given to you by your health care provider. Make sure you discuss any questions you have with your health care provider. °  °Document Released: 11/03/2005 Document Revised: 08/24/2013 Document Reviewed: 06/02/2013 °Elsevier Interactive Patient Education ©2016 [employer] ° ° ° °Bacterial Vaginosis °Bacterial vaginosis is a vaginal infection that occurs when the normal balance of bacteria in the vagina is disrupted. It results from an overgrowth of certain bacteria. This is the most common vaginal infection in women of childbearing age. Treatment is important to prevent complications, especially in pregnant women, as it can cause a premature delivery. °CAUSES  °Bacterial vaginosis is caused by an increase in harmful bacteria that are normally present in smaller amounts in the vagina. Several different kinds of bacteria can cause bacterial vaginosis. However, the reason that the condition develops is not fully understood. °RISK FACTORS °Certain activities or behaviors can put you at an increased risk of developing bacterial vaginosis, including: °· Having a new sex partner or multiple sex partners. °· Douching. °· Using an intrauterine device (IUD) for contraception. °Women do not get bacterial vaginosis from toilet seats, bedding, swimming pools, or contact with objects around them. °SIGNS AND SYMPTOMS  °Some women with bacterial vaginosis have no signs or symptoms. Common symptoms include: °· Grey vaginal discharge. °· A fishlike odor with discharge, especially after sexual intercourse. °· Itching or burning of the vagina and vulva. °· Burning or pain with urination. °DIAGNOSIS  °Your health care provider will take a medical history and examine the vagina for signs   of bacterial vaginosis. A sample of vaginal fluid may be taken. Your health care provider will look at this sample under a microscope to check for bacteria and abnormal cells. A vaginal pH test may  also be done.  °TREATMENT  °Bacterial vaginosis may be treated with antibiotic medicines. These may be given in the form of a pill or a vaginal cream. A second round of antibiotics may be prescribed if the condition comes back after treatment. Because bacterial vaginosis increases your risk for sexually transmitted diseases, getting treated can help reduce your risk for chlamydia, gonorrhea, HIV, and herpes. °HOME CARE INSTRUCTIONS  °· Only take over-the-counter or prescription medicines as directed by your health care provider. °· If antibiotic medicine was prescribed, take it as directed. Make sure you finish it even if you start to feel better. °· Tell all sexual partners that you have a vaginal infection. They should see their health care provider and be treated if they have problems, such as a mild rash or itching. °· During treatment, it is important that you follow these instructions: °¨ Avoid sexual activity or use condoms correctly. °¨ Do not douche. °¨ Avoid alcohol as directed by your health care provider. °¨ Avoid breastfeeding as directed by your health care provider. °SEEK MEDICAL CARE IF:  °· Your symptoms are not improving after 3 days of treatment. °· You have increased discharge or pain. °· You have a fever. °MAKE SURE YOU:  °· Understand these instructions. °· Will watch your condition. °· Will get help right away if you are not doing well or get worse. °FOR MORE INFORMATION  °Centers for Disease Control and Prevention, Division of STD Prevention: www.cdc.gov/std °American Sexual Health Association (ASHA): www.ashastd.org  °  °This information is not intended to replace advice given to you by your health care provider. Make sure you discuss any questions you have with your health care provider. °  °Document Released: 11/03/2005 Document Revised: 11/24/2014 Document Reviewed: 06/15/2013 °Elsevier Interactive Patient Education ©2016 [employer]vier Inc. ° °

## 2015-11-26 NOTE — MAU Provider Note (Signed)
History     CSN: [id number]  Arrival date and time: 11/26/15 1124   First Provider Initiated Contact with Patient 11/26/15 1154       Chief Complaint  Patient presents with  . Abdominal Pain   HPI  Anna GenreJessica L Kho is a 30 y.o. G2P1001 at [id number] who presents with abdominal pain.  Symptoms began 1 week ago. Pain is in RLQ; intermittent pain that feels cramp like & sharp. Rates pain as 6/10. Has treated with tylenol with minimal relief. Had 2 positive pregnancy tests this week.  Denies vaginal bleeding, vaginal discharge, urinary complaints, or fever.  Vomits occasionally, last time was yesterday. Denies nausea, diarrhea, or constipation.    OB History    Gravida Para Term Preterm AB TAB SAB Ectopic Multiple Living   2 1 1       1       Past Medical History  Diagnosis Date  . Gallstones   . Asthma     no problems since 2009  . Anemia   . Trichomonas   . Bacterial vaginosis   . Hypertension     Past Surgical History  Procedure Laterality Date  . Cholecystectomy  04/13/2012    Procedure: LAPAROSCOPIC CHOLECYSTECTOMY WITH INTRAOPERATIVE CHOLANGIOGRAM;  Surgeon: Adolph Pollackodd J Rosenbower, MD;  [facility];  Service: General;  Laterality: N/A;  laparoscopic cholecystectomy with IOC    Family History  Problem Relation Age of Onset  . Hypertension Mother   . Stroke Mother   . Diabetes Other   . Heart disease Father   . Depression Father   . Cancer Paternal Grandmother     breast  . Diabetes Paternal Grandmother   . Cancer Paternal Grandfather   . Hypertension Maternal Grandmother     Social History  Substance Use Topics  . Smoking status: Current Every Day Smoker -- 0.25 packs/day    Types: Cigarettes  . Smokeless tobacco: Never Used  . Alcohol Use: No     Comment: socially    Allergies: No Known Allergies  Prescriptions prior to admission  Medication Sig Dispense Refill Last Dose  . albuterol (PROVENTIL HFA;VENTOLIN HFA) 108 (90 BASE) MCG/ACT inhaler Inhale 2  puffs into the lungs every 6 (six) hours as needed for wheezing.    Taking  . azithromycin (ZITHROMAX) 250 MG tablet Take 4 tablets (1,000 mg total) by mouth once. (Patient not taking: Reported on 05/16/2015) 4 tablet 0 Not Taking  . carvedilol (COREG) 12.5 MG tablet Take 1 tablet (12.5 mg total) by mouth 2 (two) times daily with a meal. 60 tablet 11 Taking  . cefTRIAXone (ROCEPHIN) 250 MG injection Inject 250 mg into the muscle once.  FOR IM use in LARGE MUSCLE MASS (Patient not taking: Reported on 05/16/2015) 1 each 0 Not Taking  . hydrochlorothiazide (HYDRODIURIL) 25 MG tablet Take 1 tablet (25 mg total) by mouth daily. 30 tablet 12 Taking  . ibuprofen (ADVIL,MOTRIN) 600 MG tablet Take 1 tablet (600 mg total) by mouth every 6 (six) hours as needed. (Patient not taking: Reported on 05/16/2015) 30 tablet 1 Not Taking  . IRON PO Take 1 tablet by mouth daily.   Taking    Review of Systems  Constitutional: Negative.   Gastrointestinal: Positive for vomiting and abdominal pain. Negative for nausea, diarrhea and constipation.  Genitourinary: Negative.  Negative for dysuria.   Physical Exam   Blood pressure 122/85, pulse 85, temperature 98.2 F (36.8 C), temperature source Oral, resp. rate 16, last menstrual period 10/15/2015.  Physical Exam  Nursing note and vitals reviewed. Constitutional: She is oriented to person, place, and time. She appears well-developed and well-nourished. No distress.  HENT:  Head: Normocephalic and atraumatic.  Eyes: Conjunctivae are normal. Right eye exhibits no discharge. Left eye exhibits no discharge. No scleral icterus.  Neck: Normal range of motion.  Cardiovascular: Normal rate, regular rhythm and normal heart sounds.   No murmur heard. Respiratory: Effort normal and breath sounds normal. No respiratory distress. She has no wheezes.  GI: Soft. Bowel sounds are normal. She exhibits no distension. There is tenderness (mild tenderness to deep palpation in RLQ).   Genitourinary: Vagina normal and uterus normal. Cervix exhibits discharge (moderate amount of yellow mucoid discharge). Cervix exhibits no motion tenderness and no friability. Right adnexum displays no mass, no tenderness and no fullness. Left adnexum displays no mass, no tenderness and no fullness.  Cervix closed  Neurological: She is alert and oriented to person, place, and time.  Skin: Skin is warm and dry. She is not diaphoretic.  Psychiatric: She has a normal mood and affect. Her behavior is normal. Judgment and thought content normal.    MAU Course  Procedures Results for orders placed or performed during the hospital encounter of 11/26/15 (from the past 24 hour(s))  Urinalysis, Routine w reflex microscopic (not at [facility])     Status: Abnormal   Collection Time: 11/26/15 11:35 AM  Result Value Ref Range   Color, Urine YELLOW YELLOW   APPearance HAZY (A) CLEAR   Specific Gravity, Urine 1.010 1.005 - 1.030   pH 6.0 5.0 - 8.0   Glucose, UA NEGATIVE NEGATIVE mg/dL   Hgb urine dipstick SMALL (A) NEGATIVE   Bilirubin Urine NEGATIVE NEGATIVE   Ketones, ur NEGATIVE NEGATIVE mg/dL   Protein, ur NEGATIVE NEGATIVE mg/dL   Nitrite NEGATIVE NEGATIVE   Leukocytes, UA LARGE (A) NEGATIVE  Urine microscopic-add on     Status: Abnormal   Collection Time: 11/26/15 11:35 AM  Result Value Ref Range   Squamous Epithelial / LPF 6-30 (A) NONE SEEN   WBC, UA 6-30 0 - 5 WBC/hpf   RBC / HPF 0-5 0 - 5 RBC/hpf   Bacteria, UA FEW (A) NONE SEEN  Pregnancy, urine POC     Status: Abnormal   Collection Time: 11/26/15 11:40 AM  Result Value Ref Range   Preg Test, Ur POSITIVE (A) NEGATIVE  Wet prep, genital     Status: Abnormal   Collection Time: 11/26/15 12:06 PM  Result Value Ref Range   Yeast Wet Prep HPF POC NONE SEEN NONE SEEN   Trich, Wet Prep NONE SEEN NONE SEEN   Clue Cells Wet Prep HPF POC FEW (A) NONE SEEN   WBC, Wet Prep HPF POC MODERATE (A) NONE SEEN   Sperm NONE SEEN   hCG, quantitative,  pregnancy     Status: Abnormal   Collection Time: 11/26/15 12:11 PM  Result Value Ref Range   hCG, Beta Chain, Quant, S 3733 (H) <5 mIU/mL  CBC     Status: None   Collection Time: 11/26/15 12:12 PM  Result Value Ref Range   WBC 9.4 4.0 - 10.5 K/uL   RBC 4.14 3.87 - 5.11 MIL/uL   Hemoglobin 12.3 12.0 - 15.0 g/dL   HCT 40.9 81.1 - 91.4 %   MCV 90.1 78.0 - 100.0 fL   MCH 29.7 26.0 - 34.0 pg   MCHC 33.0 30.0 - 36.0 g/dL   RDW 78.2 95.6 - 21.3 %  Platelets 249 150 - 400 K/uL  ABO/Rh     Status: None   Collection Time: 11/26/15 12:13 PM  Result Value Ref Range   ABO/RH(D) O POS    US Ob Comp Less 14 Wks  11/26/2015  CLINICAL DATA:  Abdominal pain. EXAM: OBSTETRIC <14 WK [place] AND TRANSVAGINAL OB US TECHNIQUE: Both transabdominal and transvaginal ultrasound examinations were performed for complete evaluation of the gestation as well as the maternal uterus, adnexal regions, and pelvic cul-de-sac. Transvaginal technique was performed to assess early pregnancy. COMPARISON:  None. FINDINGS: Intrauterine gestational sac: Visualized/normal in shape. Yolk sac:  Nonvisualized Embryo:  Nonvisualized Cardiac Activity: Nonvisualized MSD: 4.3  mm   5 w   1  d Subchorionic hemorrhage:  Small subchorionic hemorrhage. Maternal uterus/adnexae: Normal bilateral ovaries. Right ovary measures 2.7 x 1.7 x 2.1 cm. Left ovary measures 2.8 x 1.8 x 1.8 cm. No pelvic free fluid. No adnexal mass. IMPRESSION: 1. Intrauterine gestational sac without a fetal pole visualized. Probable early intrauterine gestational sac, but no yolk sac, fetal pole, or cardiac activity yet visualized. Recommend follow-up quantitative B-HCG levels and follow-up US in 14 days to confirm and assess viability. This recommendation follows SRU consensus guidelines: Diagnostic Criteria for Nonviable Pregnancy Early in the First Trimester. Malva Limes Med 2013; 161:0960-45. Electronically Signed   By: Elige Ko   On: 11/26/2015 13:12   US Ob  Transvaginal  11/26/2015  CLINICAL DATA:  Abdominal pain. EXAM: OBSTETRIC <14 WK [place] AND TRANSVAGINAL OB US TECHNIQUE: Both transabdominal and transvaginal ultrasound examinations were performed for complete evaluation of the gestation as well as the maternal uterus, adnexal regions, and pelvic cul-de-sac. Transvaginal technique was performed to assess early pregnancy. COMPARISON:  None. FINDINGS: Intrauterine gestational sac: Visualized/normal in shape. Yolk sac:  Nonvisualized Embryo:  Nonvisualized Cardiac Activity: Nonvisualized MSD: 4.3  mm   5 w   1  d Subchorionic hemorrhage:  Small subchorionic hemorrhage. Maternal uterus/adnexae: Normal bilateral ovaries. Right ovary measures 2.7 x 1.7 x 2.1 cm. Left ovary measures 2.8 x 1.8 x 1.8 cm. No pelvic free fluid. No adnexal mass. IMPRESSION: 1. Intrauterine gestational sac without a fetal pole visualized. Probable early intrauterine gestational sac, but no yolk sac, fetal pole, or cardiac activity yet visualized. Recommend follow-up quantitative B-HCG levels and follow-up US in 14 days to confirm and assess viability. This recommendation follows SRU consensus guidelines: Diagnostic Criteria for Nonviable Pregnancy Early in the First Trimester. Malva Limes Med 2013; 409:8119-14. Electronically Signed   By: Elige Ko   On: 11/26/2015 13:12    MDM UPT positive Per ultrasound - probable IUGS, no yolk sac.  1323- S/w Dr. Clearance Coots. Ok to f/u in [facility] for BHCG in 48 hrs.  O positive Assessment and Plan  A:  1. Pregnancy of unknown anatomic location   2. Abdominal pain in pregnancy   3. BV (bacterial vaginosis)    P: Discharge home F/u in 48hrs for BHCG in clinic Discussed reasons to return to [facility] Rx flagyl   Judeth Horn, NP  11/26/2015, 11:54 AM

## 2015-11-27 LAB — GC/CHLAMYDIA PROBE AMP (~~LOC~~) NOT AT ARMC
Chlamydia: NEGATIVE
NEISSERIA GONORRHEA: NEGATIVE

## 2015-11-27 LAB — HIV ANTIBODY (ROUTINE TESTING W REFLEX): HIV SCREEN 4TH GENERATION: NONREACTIVE

## 2015-11-28 ENCOUNTER — Ambulatory Visit (INDEPENDENT_AMBULATORY_CARE_PROVIDER_SITE_OTHER): Payer: Medicaid Other | Admitting: Obstetrics & Gynecology

## 2015-11-28 DIAGNOSIS — Z3201 Encounter for pregnancy test, result positive: Secondary | ICD-10-CM

## 2015-11-28 DIAGNOSIS — O3680X0 Pregnancy with inconclusive fetal viability, not applicable or unspecified: Secondary | ICD-10-CM

## 2015-11-28 LAB — HCG, QUANTITATIVE, PREGNANCY: hCG, Beta Chain, Quant, S: 6443 m[IU]/mL — ABNORMAL HIGH (ref <5)

## 2015-11-28 NOTE — Progress Notes (Signed)
Patient here for stat bhcg. Denies bleeding or pain.

## 2015-11-28 NOTE — Progress Notes (Signed)
US for viability scheduled for January 24th @ 0900

## 2015-11-28 NOTE — Progress Notes (Signed)
   Subjective:    Patient ID: Shirley Terrell, female    DOB: 03/14/1986, 30 y.o.   MRN: [id number]  HPI  [facility]  Review of Systems     Objective:   Physical Exam  The numbers doubled appropriately.      Assessment & Plan:  She will have a follow up u/s in 2 weeks and a NOB visit soon.

## 2015-12-11 ENCOUNTER — Ambulatory Visit (HOSPITAL_COMMUNITY)
Admission: RE | Admit: 2015-12-11 | Discharge: 2015-12-11 | Disposition: A | Discharge status: Home / Self Care | Payer: Medicaid Other | Source: Ambulatory Visit | Attending: Obstetrics & Gynecology | Admitting: Obstetrics & Gynecology

## 2015-12-11 ENCOUNTER — Ambulatory Visit (INDEPENDENT_AMBULATORY_CARE_PROVIDER_SITE_OTHER): Payer: Medicaid Other | Admitting: Advanced Practice Midwife

## 2015-12-11 ENCOUNTER — Encounter: Payer: Self-pay | Admitting: Advanced Practice Midwife

## 2015-12-11 DIAGNOSIS — O208 Other hemorrhage in early pregnancy: Secondary | ICD-10-CM | POA: Insufficient documentation

## 2015-12-11 DIAGNOSIS — O3680X0 Pregnancy with inconclusive fetal viability, not applicable or unspecified: Secondary | ICD-10-CM

## 2015-12-11 DIAGNOSIS — Z3A01 Less than 8 weeks gestation of pregnancy: Secondary | ICD-10-CM | POA: Diagnosis not present

## 2015-12-11 DIAGNOSIS — R109 Unspecified abdominal pain: Secondary | ICD-10-CM | POA: Diagnosis not present

## 2015-12-11 DIAGNOSIS — O9989 Other specified diseases and conditions complicating pregnancy, childbirth and the puerperium: Secondary | ICD-10-CM | POA: Diagnosis not present

## 2015-12-11 DIAGNOSIS — O26899 Other specified pregnancy related conditions, unspecified trimester: Secondary | ICD-10-CM

## 2015-12-11 DIAGNOSIS — Z36 Encounter for antenatal screening of mother: Secondary | ICD-10-CM | POA: Diagnosis present

## 2015-12-11 NOTE — Patient Instructions (Signed)
First Trimester of Pregnancy The first trimester of pregnancy is from week 1 until the end of week 12 (months 1 through 3). A week after a sperm fertilizes an egg, the egg will implant on the wall of the uterus. This embryo will begin to develop into a baby. Genes from you and your partner are forming the baby. The female genes determine whether the baby is a boy or a girl. At 6-8 weeks, the eyes and face are formed, and the heartbeat can be seen on ultrasound. At the end of 12 weeks, all the baby's organs are formed.  Now that you are pregnant, you will want to do everything you can to have a healthy baby. Two of the most important things are to get good prenatal care and to follow your health care provider's instructions. Prenatal care is all the medical care you receive before the baby's birth. This care will help prevent, find, and treat any problems during the pregnancy and childbirth. BODY CHANGES Your body goes through many changes during pregnancy. The changes vary from woman to woman.   You may gain or lose a couple of pounds at first.  You may feel sick to your stomach (nauseous) and throw up (vomit). If the vomiting is uncontrollable, call your health care provider.  You may tire easily.  You may develop headaches that can be relieved by medicines approved by your health care provider.  You may urinate more often. Painful urination may mean you have a bladder infection.  You may develop heartburn as a result of your pregnancy.  You may develop constipation because certain hormones are causing the muscles that push waste through your intestines to slow down.  You may develop hemorrhoids or swollen, bulging veins (varicose veins).  Your breasts may begin to grow larger and become tender. Your nipples may stick out more, and the tissue that surrounds them (areola) may become darker.  Your gums may bleed and may be sensitive to brushing and flossing.  Dark spots or blotches (chloasma,  mask of pregnancy) may develop on your face. This will likely fade after the baby is born.  Your menstrual periods will stop.  You may have a loss of appetite.  You may develop cravings for certain kinds of food.  You may have changes in your emotions from day to day, such as being excited to be pregnant or being concerned that something may go wrong with the pregnancy and baby.  You may have more vivid and strange dreams.  You may have changes in your hair. These can include thickening of your hair, rapid growth, and changes in texture. Some women also have hair loss during or after pregnancy, or hair that feels dry or thin. Your hair will most likely return to normal after your baby is born. WHAT TO EXPECT AT YOUR PRENATAL VISITS During a routine prenatal visit:  You will be weighed to make sure you and the baby are growing normally.  Your blood pressure will be taken.  Your abdomen will be measured to track your baby's growth.  The fetal heartbeat will be listened to starting around week 10 or 12 of your pregnancy.  Test results from any previous visits will be discussed. Your health care provider may ask you:  How you are feeling.  If you are feeling the baby move.  If you have had any abnormal symptoms, such as leaking fluid, bleeding, severe headaches, or abdominal cramping.  If you are using any tobacco products,   including cigarettes, chewing tobacco, and electronic cigarettes.  If you have any questions. Other tests that may be performed during your first trimester include:  Blood tests to find your blood type and to check for the presence of any previous infections. They will also be used to check for low iron levels (anemia) and Rh antibodies. Later in the pregnancy, blood tests for diabetes will be done along with other tests if problems develop.  Urine tests to check for infections, diabetes, or protein in the urine.  An ultrasound to confirm the proper growth  and development of the baby.  An amniocentesis to check for possible genetic problems.  Fetal screens for spina bifida and Down syndrome.  You may need other tests to make sure you and the baby are doing well.  HIV (human immunodeficiency virus) testing. Routine prenatal testing includes screening for HIV, unless you choose not to have this test. HOME CARE INSTRUCTIONS  Medicines  Follow your health care provider's instructions regarding medicine use. Specific medicines may be either safe or unsafe to take during pregnancy.  Take your prenatal vitamins as directed.  If you develop constipation, try taking a stool softener if your health care provider approves. Diet  Eat regular, well-balanced meals. Choose a variety of foods, such as meat or vegetable-based protein, fish, milk and low-fat dairy products, vegetables, fruits, and whole grain breads and cereals. Your health care provider will help you determine the amount of weight gain that is right for you.  Avoid raw meat and uncooked cheese. These carry germs that can cause birth defects in the baby.  Eating four or five small meals rather than three large meals a day may help relieve nausea and vomiting. If you start to feel nauseous, eating a few soda crackers can be helpful. Drinking liquids between meals instead of during meals also seems to help nausea and vomiting.  If you develop constipation, eat more high-fiber foods, such as fresh vegetables or fruit and whole grains. Drink enough fluids to keep your urine clear or pale yellow. Activity and Exercise  Exercise only as directed by your health care provider. Exercising will help you:  Control your weight.  Stay in shape.  Be prepared for labor and delivery.  Experiencing pain or cramping in the lower abdomen or low back is a good sign that you should stop exercising. Check with your health care provider before continuing normal exercises.  Try to avoid standing for long  periods of time. Move your legs often if you must stand in one place for a long time.  Avoid heavy lifting.  Wear low-heeled shoes, and practice good posture.  You may continue to have sex unless your health care provider directs you otherwise. Relief of Pain or Discomfort  Wear a good support bra for breast tenderness.   Take warm sitz baths to soothe any pain or discomfort caused by hemorrhoids. Use hemorrhoid cream if your health care provider approves.   Rest with your legs elevated if you have leg cramps or low back pain.  If you develop varicose veins in your legs, wear support hose. Elevate your feet for 15 minutes, 3-4 times a day. Limit salt in your diet. Prenatal Care  Schedule your prenatal visits by the twelfth week of pregnancy. They are usually scheduled monthly at first, then more often in the last 2 months before delivery.  Write down your questions. Take them to your prenatal visits.  Keep all your prenatal visits as directed by your   health care provider. Safety  Wear your seat belt at all times when driving.  Make a list of emergency phone numbers, including numbers for family, friends, the hospital, and police and fire departments. General Tips  Ask your health care provider for a referral to a local prenatal education class. Begin classes no later than at the beginning of month 6 of your pregnancy.  Ask for help if you have counseling or nutritional needs during pregnancy. Your health care provider can offer advice or refer you to specialists for help with various needs.  Do not use hot tubs, steam rooms, or saunas.  Do not douche or use tampons or scented sanitary pads.  Do not cross your legs for long periods of time.  Avoid cat litter boxes and soil used by cats. These carry germs that can cause birth defects in the baby and possibly loss of the fetus by miscarriage or stillbirth.  Avoid all smoking, herbs, alcohol, and medicines not prescribed by  your health care provider. Chemicals in these affect the formation and growth of the baby.  Do not use any tobacco products, including cigarettes, chewing tobacco, and electronic cigarettes. If you need help quitting, ask your health care provider. You may receive counseling support and other resources to help you quit.  Schedule a dentist appointment. At home, brush your teeth with a soft toothbrush and be gentle when you floss. SEEK MEDICAL CARE IF:   You have dizziness.  You have mild pelvic cramps, pelvic pressure, or nagging pain in the abdominal area.  You have persistent nausea, vomiting, or diarrhea.  You have a bad smelling vaginal discharge.  You have pain with urination.  You notice increased swelling in your face, hands, legs, or ankles. SEEK IMMEDIATE MEDICAL CARE IF:   You have a fever.  You are leaking fluid from your vagina.  You have spotting or bleeding from your vagina.  You have severe abdominal cramping or pain.  You have rapid weight gain or loss.  You vomit blood or material that looks like coffee grounds.  You are exposed to German measles and have never had them.  You are exposed to fifth disease or chickenpox.  You develop a severe headache.  You have shortness of breath.  You have any kind of trauma, such as from a fall or a car accident.   This information is not intended to replace advice given to you by your health care provider. Make sure you discuss any questions you have with your health care provider.   Document Released: 10/28/2001 Document Revised: 11/24/2014 Document Reviewed: 09/13/2013 Elsevier Interactive Patient Education 2016 [employer]  

## 2015-12-11 NOTE — Progress Notes (Signed)
Chief Complaint: No chief complaint on file.   Originally seen for abdominal pain in early pregnancy    SUBJECTIVE HPI HPI: Shirley Terrell is a 30 y.o. G2P1001 at [redacted]w[redacted]d by LMP who presents to maternity admissions for followup ultrasound. Upon review of the records patient was first seen on 11/26/15 for pain.   BHCG on that day was 3733.  Ultrasound showed empty sac.  GC/CT and wet prep were collected.  Results were Negative.   Pt discharged home.   Pt here today with no report of abdominal pain or vaginal bleeding.   Last seen in MAU on 11/28/15.      BHCG was 6443.     Past Medical History  Diagnosis Date  . Gallstones   . Asthma     no problems since 2009  . Anemia   . Trichomonas   . Bacterial vaginosis   . Hypertension    Past Surgical History  Procedure Laterality Date  . Cholecystectomy  04/13/2012    Procedure: LAPAROSCOPIC CHOLECYSTECTOMY WITH INTRAOPERATIVE CHOLANGIOGRAM;  Surgeon: Adolph Pollack, MD;  Location: [facility] OR;  Service: General;  Laterality: N/A;  laparoscopic cholecystectomy with IOC   Social History   Social History  . Marital Status: Single    Spouse Name: N/A  . Number of Children: N/A  . Years of Education: N/A   Occupational History  . Not on file.   Social History Main Topics  . Smoking status: Former Smoker -- 0.25 packs/day    Types: Cigarettes    Quit date: 11/19/2015  . Smokeless tobacco: Never Used  . Alcohol Use: No     Comment: socially  . Drug Use: No  . Sexual Activity:    Partners: Male   Other Topics Concern  . Not on file   Social History Narrative   Current Outpatient Prescriptions on File Prior to Visit  Medication Sig Dispense Refill  . albuterol (PROVENTIL HFA;VENTOLIN HFA) 108 (90 BASE) MCG/ACT inhaler Inhale 2 puffs into the lungs every 6 (six) hours as needed for wheezing.     . carvedilol (COREG) 12.5 MG tablet Take 1 tablet (12.5 mg total) by mouth 2 (two) times daily with a meal. 60 tablet 11  .  hydrochlorothiazide (HYDRODIURIL) 25 MG tablet Take 1 tablet (25 mg total) by mouth daily. 30 tablet 12  . IRON PO Take 1 tablet by mouth daily.    . metroNIDAZOLE (FLAGYL) 500 MG tablet Take 1 tablet (500 mg total) by mouth 2 (two) times daily. 14 tablet 0  . promethazine (PHENERGAN) 25 MG tablet Take 1 tablet (25 mg total) by mouth every 6 (six) hours as needed for nausea or vomiting. 30 tablet 0   No current facility-administered medications on file prior to visit.   No Known Allergies  ROS:  Review of Systems Review of Systems  Constitutional: Negative for fever and chills.  Gastrointestinal: Negative for nausea, vomiting, abdominal pain, diarrhea and constipation.  Genitourinary: Negative for dysuria.  Musculoskeletal: Negative for back pain.  Neurological: Negative for dizziness and weakness.    I have reviewed patient's Past Medical Hx, Surgical Hx, Family Hx, Social Hx, medications and allergies.   Physical Exam  @ Constitutional: Well-developed, well-nourished female in no acute distress.  Cardiovascular: normal rate Respiratory: normal effort GI: Abd soft, non-tender. Pos BS x 4 MS: Extremities nontender, no edema, normal ROM Neurologic: Alert and oriented x 4.  GU: Neg CVAT.   Physical Exam LAB RESULTS No results found for this  or any previous visit (from the past 24 hour(s)).  --/--/O POS (01/09 1213)  IMAGING US Ob Comp Less 14 Wks  11/26/2015  CLINICAL DATA:  Abdominal pain. EXAM: OBSTETRIC <14 WK [place] AND TRANSVAGINAL OB US TECHNIQUE: Both transabdominal and transvaginal ultrasound examinations were performed for complete evaluation of the gestation as well as the maternal uterus, adnexal regions, and pelvic cul-de-sac. Transvaginal technique was performed to assess early pregnancy. COMPARISON:  None. FINDINGS: Intrauterine gestational sac: Visualized/normal in shape. Yolk sac:  Nonvisualized Embryo:  Nonvisualized Cardiac Activity: Nonvisualized MSD: 4.3   mm   5 w   1  d Subchorionic hemorrhage:  Small subchorionic hemorrhage. Maternal uterus/adnexae: Normal bilateral ovaries. Right ovary measures 2.7 x 1.7 x 2.1 cm. Left ovary measures 2.8 x 1.8 x 1.8 cm. No pelvic free fluid. No adnexal mass. IMPRESSION: 1. Intrauterine gestational sac without a fetal pole visualized. Probable early intrauterine gestational sac, but no yolk sac, fetal pole, or cardiac activity yet visualized. Recommend follow-up quantitative B-HCG levels and follow-up US in 14 days to confirm and assess viability. This recommendation follows SRU consensus guidelines: Diagnostic Criteria for Nonviable Pregnancy Early in the First Trimester. Malva Limes Med 2013; 960:4540-98. Electronically Signed   By: Elige Ko   On: 11/26/2015 13:12   US Ob Transvaginal  12/11/2015  CLINICAL DATA:  Pregnancy. EXAM: OBSTETRIC <14 WK [place] AND TRANSVAGINAL OB US TECHNIQUE: Both transabdominal and transvaginal ultrasound examinations were performed for complete evaluation of the gestation as well as the maternal uterus, adnexal regions, and pelvic cul-de-sac. Transvaginal technique was performed to assess early pregnancy. COMPARISON:  11/26/2015. FINDINGS: Intrauterine gestational sac: Visualized/normal in shape. Yolk sac:  Present. Embryo:  Present. Cardiac Activity: Present. Heart Rate: 133  bpm MSD: 2.1  mm   7 w   0  d CRL:  1.0  mm   7 w   0 d                  [place] EDC: 07/29/2016 Subchorionic hemorrhage:  Small subchorionic hemorrhage. Maternal uterus/adnexae: Unremarkable. IMPRESSION: 1. Single viable injury pregnancy at 7 weeks 0 days. 2. Small subchorionic hemorrhage. Electronically Signed   By: Maisie Fus  Register   On: 12/11/2015 09:33   US Ob Transvaginal  11/26/2015  CLINICAL DATA:  Abdominal pain. EXAM: OBSTETRIC <14 WK [place] AND TRANSVAGINAL OB US TECHNIQUE: Both transabdominal and transvaginal ultrasound examinations were performed for complete evaluation of the gestation as well as the maternal uterus,  adnexal regions, and pelvic cul-de-sac. Transvaginal technique was performed to assess early pregnancy. COMPARISON:  None. FINDINGS: Intrauterine gestational sac: Visualized/normal in shape. Yolk sac:  Nonvisualized Embryo:  Nonvisualized Cardiac Activity: Nonvisualized MSD: 4.3  mm   5 w   1  d Subchorionic hemorrhage:  Small subchorionic hemorrhage. Maternal uterus/adnexae: Normal bilateral ovaries. Right ovary measures 2.7 x 1.7 x 2.1 cm. Left ovary measures 2.8 x 1.8 x 1.8 cm. No pelvic free fluid. No adnexal mass. IMPRESSION: 1. Intrauterine gestational sac without a fetal pole visualized. Probable early intrauterine gestational sac, but no yolk sac, fetal pole, or cardiac activity yet visualized. Recommend follow-up quantitative B-HCG levels and follow-up US in 14 days to confirm and assess viability. This recommendation follows SRU consensus guidelines: Diagnostic Criteria for Nonviable Pregnancy Early in the First Trimester. Malva Limes Med 2013; 119:1478-29. Electronically Signed   By: Elige Ko   On: 11/26/2015 13:12    MAU Management/MDM: Reviewed [place] . Pt stable at time of discharge.  This bleeding could represent a normal pregnancy with bleeding, spontaneous abortion or even an ectopic which can be life-threatening.   Cultures were done to rule out pelvic infection Blood drawn for Quant HCG, CBC, ABO/Rh  ASSESSMENT 1. Abdominal pain affecting pregnancy, antepartum   2.  SIUP at 7.0 weeks 3.  Small subchorionic hemorrhage  PLAN Discharge home Has appt for new ob in February    Medication List       This list is accurate as of: 12/11/15 10:52 AM.  Always use your most recent med list.               albuterol 108 (90 Base) MCG/ACT inhaler  Commonly known as:  PROVENTIL HFA;VENTOLIN HFA  Inhale 2 puffs into the lungs every 6 (six) hours as needed for wheezing.     carvedilol 12.5 MG tablet  Commonly known as:  COREG  Take 1 tablet (12.5 mg total) by mouth 2 (two) times  daily with a meal.     hydrochlorothiazide 25 MG tablet  Commonly known as:  HYDRODIURIL  Take 1 tablet (25 mg total) by mouth daily.     IRON PO  Take 1 tablet by mouth daily.     metroNIDAZOLE 500 MG tablet  Commonly known as:  FLAGYL  Take 1 tablet (500 mg total) by mouth 2 (two) times daily.     promethazine 25 MG tablet  Commonly known as:  PHENERGAN  Take 1 tablet (25 mg total) by mouth every 6 (six) hours as needed for nausea or vomiting.         Wynelle Bourgeois CNM, MSN Certified Nurse-Midwife 12/11/2015  10:52 AM

## 2015-12-18 ENCOUNTER — Ambulatory Visit: Payer: Medicaid Other | Admitting: Advanced Practice Midwife

## 2016-01-09 ENCOUNTER — Encounter: Payer: Self-pay | Admitting: Advanced Practice Midwife

## 2016-01-09 ENCOUNTER — Ambulatory Visit (INDEPENDENT_AMBULATORY_CARE_PROVIDER_SITE_OTHER): Payer: Medicaid Other | Admitting: Advanced Practice Midwife

## 2016-01-09 VITALS — BP 112/79 | HR 97 | Temp 98.0°F | Wt 211.7 lb

## 2016-01-09 DIAGNOSIS — O10919 Unspecified pre-existing hypertension complicating pregnancy, unspecified trimester: Secondary | ICD-10-CM | POA: Insufficient documentation

## 2016-01-09 DIAGNOSIS — Z23 Encounter for immunization: Secondary | ICD-10-CM

## 2016-01-09 DIAGNOSIS — O09899 Supervision of other high risk pregnancies, unspecified trimester: Secondary | ICD-10-CM

## 2016-01-09 DIAGNOSIS — I10 Essential (primary) hypertension: Secondary | ICD-10-CM | POA: Insufficient documentation

## 2016-01-09 DIAGNOSIS — O10911 Unspecified pre-existing hypertension complicating pregnancy, first trimester: Secondary | ICD-10-CM

## 2016-01-09 LAB — POCT URINALYSIS DIP (DEVICE)
BILIRUBIN URINE: NEGATIVE
Glucose, UA: NEGATIVE mg/dL
HGB URINE DIPSTICK: NEGATIVE
Ketones, ur: NEGATIVE mg/dL
NITRITE: NEGATIVE
PH: 7 (ref 5.0–8.0)
Protein, ur: NEGATIVE mg/dL
Specific Gravity, Urine: 1.015 (ref 1.005–1.030)
Urobilinogen, UA: 0.2 mg/dL (ref 0.0–1.0)

## 2016-01-09 MED ORDER — CONCEPT OB 130-92.4-1 MG PO CAPS: 1.0000 | ORAL_CAPSULE | Freq: Every day | ORAL | Status: DC

## 2016-01-09 MED ORDER — ASPIRIN EC 81 MG PO TBEC: 81.0000 mg | DELAYED_RELEASE_TABLET | Freq: Every day | ORAL | Status: DC

## 2016-01-09 NOTE — Progress Notes (Signed)
Subjective:    Shirley Terrell is a G2P1001 [redacted]w[redacted]d by 7.0 week [place] being seen today for her first obstetrical visit.  Her obstetrical history is significant for [facility]. Was on HCTZ and Coreg, but stopped due to pregnancy. Patient does intend to breast feed. Pregnancy history fully reviewed.  Was seen in MAU in January. Had US showing live SIUP. GC/Chlamydia neg. Last Pap < 1 year ago at [facility] per pt, but not in Epic. Will request records.   Patient reports no bleeding and no cramping.  Filed Vitals:   01/09/16 1243  BP: 112/79  Pulse: 97  Temp: 98 F (36.7 C)  Weight: 211 lb 11.2 oz (96.026 kg)    HISTORY: OB History  Gravida Para Term Preterm AB SAB TAB Ectopic Multiple Living  # Outcome Date GA Lbr Len/2nd Weight Sex Delivery Anes PTL Lv  2 Current           1 Term 01/14/09 [redacted]w[redacted]d   F Vag-Spont   Y     Past Medical History  Diagnosis Date  . Gallstones   . Asthma     no problems since 2009  . Anemia   . Trichomonas   . Bacterial vaginosis   . Hypertension    Past Surgical History  Procedure Laterality Date  . Cholecystectomy  04/13/2012    Procedure: LAPAROSCOPIC CHOLECYSTECTOMY WITH INTRAOPERATIVE CHOLANGIOGRAM;  Surgeon: Adolph Pollack, MD;  Location: [facility] OR;  Service: General;  Laterality: N/A;  laparoscopic cholecystectomy with IOC   Family History  Problem Relation Age of Onset  . Hypertension Mother   . Stroke Mother   . Diabetes Other   . Heart disease Father   . Depression Father   . Cancer Paternal Grandmother     breast  . Diabetes Paternal Grandmother   . Cancer Paternal Grandfather   . Hypertension Maternal Grandmother      Exam   Pos FHR  Uterus:     Pelvic Exam: Deferred due to recent exam.   Bony Pelvis: proven to 7+  System: Breast:  Declined   Skin: normal coloration and turgor, no rashes    Neurologic: oriented, normal mood, grossly non-focal   Extremities: normal strength, tone, and muscle mass   HEENT sclera  clear, anicteric and thyroid without masses   Mouth/Teeth mucous membranes moist, pharynx normal without lesions and dental hygiene good   Neck supple and no masses   Cardiovascular: regular rate and rhythm, no murmurs or gallops   Respiratory:  appears well, vitals normal, no respiratory distress, acyanotic, normal RR, chest clear, no wheezing, crepitations, rhonchi, normal symmetric air entry   Abdomen: soft, non-tender; bowel sounds normal; no masses,  no organomegaly   Urinary: Deferred      Assessment:    Pregnancy: G2P1001 Patient Active Problem List   Diagnosis Date Noted  . Supervision of other high risk pregnancy, antepartum 01/09/2016  . Chronic hypertension during pregnancy, antepartum 01/09/2016  . Abdominal pain affecting pregnancy, antepartum 12/11/2015     1. Supervision of other high risk pregnancy, antepartum, unspecified trimester  - Prenatal Profile - Hemoglobinopathy evaluation - Culture, OB Urine - Prescript Monitor Profile(19) - Flu Vaccine QUAD 36+ mos IM; Standing - Glucose Tolerance, 1 HR (50g) - Flu Vaccine QUAD 36+ mos IM - [place] MFM Fetal Nuchal Translucency; Future - aspirin EC 81 MG tablet; Take 1 tablet (81 mg total) by mouth daily. Take  after 12 weeks for prevention of preeclampssia later in pregnancy  Dispense: 300 tablet; Refill: 2 - Prenat w/o A Vit-FeFum-FePo-FA (CONCEPT OB) 130-92.4-1 MG CAPS; Take 1 tablet by mouth daily.  Dispense: 30 capsule; Refill: 12  2. Chronic hypertension during pregnancy, antepartum  - Prenatal Profile - Hemoglobinopathy evaluation - Culture, OB Urine - Prescript Monitor Profile(19) - Flu Vaccine QUAD 36+ mos IM; Standing - Glucose Tolerance, 1 HR (50g) - Flu Vaccine QUAD 36+ mos IM - [place] MFM Fetal Nuchal Translucency; Future - aspirin EC 81 MG tablet; Take 1 tablet (81 mg total) by mouth daily. Take after 12 weeks for prevention of preeclampssia later in pregnancy  Dispense: 300 tablet; Refill: 2     Plan:      Initial labs drawn. Prenatal vitamins. Problem list reviewed and updated. Genetic Screening discussed First Screen: ordered.  Ultrasound discussed; fetal survey: requested.  Follow up in 4 weeks. Baseline 24 hour urine  Plan Antenatal testing depending on BP.   [name] 01/09/2016

## 2016-01-09 NOTE — Patient Instructions (Addendum)
First Trimester of Pregnancy The first trimester of pregnancy is from week 1 until the end of week 12 (months 1 through 3). A week after a sperm fertilizes an egg, the egg will implant on the wall of the uterus. This embryo will begin to develop into a baby. Genes from you and your partner are forming the baby. The female genes determine whether the baby is a boy or a girl. At 6-8 weeks, the eyes and face are formed, and the heartbeat can be seen on ultrasound. At the end of 12 weeks, all the baby's organs are formed.  Now that you are pregnant, you will want to do everything you can to have a healthy baby. Two of the most important things are to get good prenatal care and to follow your health care provider's instructions. Prenatal care is all the medical care you receive before the baby's birth. This care will help prevent, find, and treat any problems during the pregnancy and childbirth. BODY CHANGES Your body goes through many changes during pregnancy. The changes vary from woman to woman.   You may gain or lose a couple of pounds at first.  You may feel sick to your stomach (nauseous) and throw up (vomit). If the vomiting is uncontrollable, call your health care provider.  You may tire easily.  You may develop headaches that can be relieved by medicines approved by your health care provider.  You may urinate more often. Painful urination may mean you have a bladder infection.  You may develop heartburn as a result of your pregnancy.  You may develop constipation because certain hormones are causing the muscles that push waste through your intestines to slow down.  You may develop hemorrhoids or swollen, bulging veins (varicose veins).  Your breasts may begin to grow larger and become tender. Your nipples may stick out more, and the tissue that surrounds them (areola) may become darker.  Your gums may bleed and may be sensitive to brushing and flossing.  Dark spots or blotches  (chloasma, mask of pregnancy) may develop on your face. This will likely fade after the baby is born.  Your menstrual periods will stop.  You may have a loss of appetite.  You may develop cravings for certain kinds of food.  You may have changes in your emotions from day to day, such as being excited to be pregnant or being concerned that something may go wrong with the pregnancy and baby.  You may have more vivid and strange dreams.  You may have changes in your hair. These can include thickening of your hair, rapid growth, and changes in texture. Some women also have hair loss during or after pregnancy, or hair that feels dry or thin. Your hair will most likely return to normal after your baby is born. WHAT TO EXPECT AT YOUR PRENATAL VISITS During a routine prenatal visit:  You will be weighed to make sure you and the baby are growing normally.  Your blood pressure will be taken.  Your abdomen will be measured to track your baby's growth.  The fetal heartbeat will be listened to starting around week 10 or 12 of your pregnancy.  Test results from any previous visits will be discussed. Your health care provider may ask you:  How you are feeling.  If you are feeling the baby move.  If you have had any abnormal symptoms, such as leaking fluid, bleeding, severe headaches, or abdominal cramping.  If you are using any tobacco products,   including cigarettes, chewing tobacco, and electronic cigarettes.  If you have any questions. Other tests that may be performed during your first trimester include:  Blood tests to find your blood type and to check for the presence of any previous infections. They will also be used to check for low iron levels (anemia) and Rh antibodies. Later in the pregnancy, blood tests for diabetes will be done along with other tests if problems develop.  Urine tests to check for infections, diabetes, or protein in the urine.  An ultrasound to confirm the  proper growth and development of the baby.  An amniocentesis to check for possible genetic problems.  Fetal screens for spina bifida and Down syndrome.  You may need other tests to make sure you and the baby are doing well.  HIV (human immunodeficiency virus) testing. Routine prenatal testing includes screening for HIV, unless you choose not to have this test. HOME CARE INSTRUCTIONS  Medicines  Follow your health care provider's instructions regarding medicine use. Specific medicines may be either safe or unsafe to take during pregnancy.  Take your prenatal vitamins as directed.  If you develop constipation, try taking a stool softener if your health care provider approves. Diet  Eat regular, well-balanced meals. Choose a variety of foods, such as meat or vegetable-based protein, fish, milk and low-fat dairy products, vegetables, fruits, and whole grain breads and cereals. Your health care provider will help you determine the amount of weight gain that is right for you.  Avoid raw meat and uncooked cheese. These carry germs that can cause birth defects in the baby.  Eating four or five small meals rather than three large meals a day may help relieve nausea and vomiting. If you start to feel nauseous, eating a few soda crackers can be helpful. Drinking liquids between meals instead of during meals also seems to help nausea and vomiting.  If you develop constipation, eat more high-fiber foods, such as fresh vegetables or fruit and whole grains. Drink enough fluids to keep your urine clear or pale yellow. Activity and Exercise  Exercise only as directed by your health care provider. Exercising will help you:  Control your weight.  Stay in shape.  Be prepared for labor and delivery.  Experiencing pain or cramping in the lower abdomen or low back is a good sign that you should stop exercising. Check with your health care provider before continuing normal exercises.  Try to avoid  standing for long periods of time. Move your legs often if you must stand in one place for a long time.  Avoid heavy lifting.  Wear low-heeled shoes, and practice good posture.  You may continue to have sex unless your health care provider directs you otherwise. Relief of Pain or Discomfort  Wear a good support bra for breast tenderness.   Take warm sitz baths to soothe any pain or discomfort caused by hemorrhoids. Use hemorrhoid cream if your health care provider approves.   Rest with your legs elevated if you have leg cramps or low back pain.  If you develop varicose veins in your legs, wear support hose. Elevate your feet for 15 minutes, 3-4 times a day. Limit salt in your diet. Prenatal Care  Schedule your prenatal visits by the twelfth week of pregnancy. They are usually scheduled monthly at first, then more often in the last 2 months before delivery.  Write down your questions. Take them to your prenatal visits.  Keep all your prenatal visits as directed by your   health care provider. °Safety °· Wear your seat belt at all times when driving. °· Make a list of emergency phone numbers, including numbers for family, friends, the hospital, and police and fire departments. °General Tips °· Ask your health care provider for a referral to a local prenatal education class. Begin classes no later than at the beginning of month 6 of your pregnancy. °· Ask for help if you have counseling or nutritional needs during pregnancy. Your health care provider can offer advice or refer you to specialists for help with various needs. °· Do not use hot tubs, steam rooms, or saunas. °· Do not douche or use tampons or scented sanitary pads. °· Do not cross your legs for long periods of time. °· Avoid cat litter boxes and soil used by cats. These carry germs that can cause birth defects in the baby and possibly loss of the fetus by miscarriage or stillbirth. °· Avoid all smoking, herbs, alcohol, and medicines not prescribed by  your health care provider. Chemicals in these affect the formation and growth of the baby. °· Do not use any tobacco products, including cigarettes, chewing tobacco, and electronic cigarettes. If you need help quitting, ask your health care provider. You may receive counseling support and other resources to help you quit. °· Schedule a dentist appointment. At home, brush your teeth with a soft toothbrush and be gentle when you floss. °SEEK MEDICAL CARE IF:  °· You have dizziness. °· You have mild pelvic cramps, pelvic pressure, or nagging pain in the abdominal area. °· You have persistent nausea, vomiting, or diarrhea. °· You have a bad smelling vaginal discharge. °· You have pain with urination. °· You notice increased swelling in your face, hands, legs, or ankles. °SEEK IMMEDIATE MEDICAL CARE IF:  °· You have a fever. °· You are leaking fluid from your vagina. °· You have spotting or bleeding from your vagina. °· You have severe abdominal cramping or pain. °· You have rapid weight gain or loss. °· You vomit blood or material that looks like coffee grounds. °· You are exposed to German measles and have never had them. °· You are exposed to fifth disease or chickenpox. °· You develop a severe headache. °· You have shortness of breath. °· You have any kind of trauma, such as from a fall or a car accident. °  °This information is not intended to replace advice given to you by your health care provider. Make sure you discuss any questions you have with your health care provider. °  °Document Released: 10/28/2001 Document Revised: 11/24/2014 Document Reviewed: 09/13/2013 °Elsevier Interactive Patient Education ©2016 [employer] ° °Hypertension During Pregnancy °Hypertension, or high blood pressure, is when there is extra pressure inside your blood vessels that carry blood from the heart to the rest of your body (arteries). It can happen at any time in life, including pregnancy. Hypertension during pregnancy can cause  problems for you and your baby. Your baby might not weigh as much as he or she should at birth or might be born early (premature). Very bad cases of hypertension during pregnancy can be life-threatening.  °Different types of hypertension can occur during pregnancy. These include: °· Chronic hypertension. This happens when a woman has hypertension before pregnancy and it continues during pregnancy. °· Gestational hypertension. This is when hypertension develops during pregnancy. °· Preeclampsia or toxemia of pregnancy. This is a very serious type of hypertension that develops only during pregnancy. It affects the whole body and can   be very dangerous for both mother and baby.   °Gestational hypertension and preeclampsia usually go away after your baby is born. Your blood pressure will likely stabilize within 6 weeks. Women who have hypertension during pregnancy have a greater chance of developing hypertension later in life or with future pregnancies. °RISK FACTORS °There are certain factors that make it more likely for you to develop hypertension during pregnancy. These include: °· Having hypertension before pregnancy. °· Having hypertension during a previous pregnancy. °· Being overweight. °· Being older than 40 years. °· Being pregnant with more than one baby. °· Having diabetes or kidney problems. °SIGNS AND SYMPTOMS °Chronic and gestational hypertension rarely cause symptoms. Preeclampsia has symptoms, which may include: °· Increased protein in your urine. Your health care provider will check for this at every prenatal visit. °· Swelling of your hands and face. °· Rapid weight gain. °· Headaches. °· Visual changes. °· Being bothered by light. °· Abdominal pain, especially in the upper right area. °· Chest pain. °· Shortness of breath. °· Increased reflexes. °· Seizures. These occur with a more severe form of preeclampsia, called eclampsia. °DIAGNOSIS  °You may be diagnosed with hypertension during a regular  prenatal exam. At each prenatal visit, you may have: °· Your blood pressure checked. °· A urine test to check for protein in your urine. °The type of hypertension you are diagnosed with depends on when you developed it. It also depends on your specific blood pressure reading. °· Developing hypertension before 20 weeks of pregnancy is consistent with chronic hypertension. °· Developing hypertension after 20 weeks of pregnancy is consistent with gestational hypertension. °· Hypertension with increased urinary protein is diagnosed as preeclampsia. °· Blood pressure measurements that stay above 160 systolic or 110 diastolic are a sign of severe preeclampsia. °TREATMENT °Treatment for hypertension during pregnancy varies. Treatment depends on the type of hypertension and how serious it is. °· If you take medicine for chronic hypertension, you may need to switch medicines. °¨ Medicines called ACE inhibitors should not be taken during pregnancy. °¨ Low-dose aspirin may be suggested for women who have risk factors for preeclampsia. °· If you have gestational hypertension, you may need to take a blood pressure medicine that is safe during pregnancy. Your health care provider will recommend the correct medicine. °· If you have severe preeclampsia, you may need to be in the hospital. Health care providers will watch you and your baby very closely. You also may need to take medicine called magnesium sulfate to prevent seizures and lower blood pressure. °· Sometimes, an early delivery is needed. This may be the case if the condition worsens. It would be done to protect you and your baby. The only cure for preeclampsia is delivery. °· Your health care provider may recommend that you take one low-dose aspirin (81 mg) each day to help prevent high blood pressure during your pregnancy if you are at risk for preeclampsia. You may be at risk for preeclampsia if: °¨ You had preeclampsia or eclampsia during a previous pregnancy. °¨ Your  baby did not grow as expected during a previous pregnancy. °¨ You experienced preterm birth with a previous pregnancy. °¨ You experienced a separation of the placenta from the uterus (placental abruption) during a previous pregnancy. °¨ You experienced the loss of your baby during a previous pregnancy. °¨ You are pregnant with more than one baby. °¨ You have other medical conditions, such as diabetes or an autoimmune disease. °HOME CARE INSTRUCTIONS °· Schedule and keep all   of your regular prenatal care appointments. This is important. °· Take medicines only as directed by your health care provider. Tell your health care provider about all medicines you take. °· Eat as little salt as possible. °· Get regular exercise. °· Do not drink alcohol. °· Do not use tobacco products. °· Do not drink products with caffeine. °· Lie on your left side when resting. °SEEK IMMEDIATE MEDICAL CARE IF: °· You have severe abdominal pain. °· You have sudden swelling in your hands, ankles, or face. °· You gain 4 pounds (1.8 kg) or more in 1 week. °· You vomit repeatedly. °· You have vaginal bleeding. °· You do not feel your baby moving as much. °· You have a headache. °· You have blurred or double vision. °· You have muscle twitching or spasms. °· You have shortness of breath. °· You have blue fingernails or lips. °· You have blood in your urine. °MAKE SURE YOU: °· Understand these instructions. °· Will watch your condition. °· Will get help right away if you are not doing well or get worse. °  °This information is not intended to replace advice given to you by your health care provider. Make sure you discuss any questions you have with your health care provider. °  °Document Released: 07/22/2011 Document Revised: 11/24/2014 Document Reviewed: 06/02/2013 °Elsevier Interactive Patient Education ©2016 [employer] ° °

## 2016-01-09 NOTE — Progress Notes (Signed)
Here for first visit. Given new patient education booklets.   Late entry: medicaid home form completed.

## 2016-01-10 ENCOUNTER — Encounter: Payer: Self-pay | Admitting: Advanced Practice Midwife

## 2016-01-10 DIAGNOSIS — O09899 Supervision of other high risk pregnancies, unspecified trimester: Secondary | ICD-10-CM | POA: Insufficient documentation

## 2016-01-10 DIAGNOSIS — Z283 Underimmunization status: Secondary | ICD-10-CM | POA: Insufficient documentation

## 2016-01-10 DIAGNOSIS — O9989 Other specified diseases and conditions complicating pregnancy, childbirth and the puerperium: Secondary | ICD-10-CM

## 2016-01-10 LAB — PRENATAL PROFILE (SOLSTAS)
Antibody Screen: NEGATIVE
BASOS ABS: 0 10*3/uL (ref 0.0–0.1)
Basophils Relative: 0 % (ref 0–1)
Eosinophils Absolute: 0.2 10*3/uL (ref 0.0–0.7)
Eosinophils Relative: 2 % (ref 0–5)
HEMATOCRIT: 34.1 % — AB (ref 36.0–46.0)
HEMOGLOBIN: 11.3 g/dL — AB (ref 12.0–15.0)
HIV: NONREACTIVE
Hepatitis B Surface Ag: NEGATIVE
LYMPHS PCT: 12 % (ref 12–46)
Lymphs Abs: 1.5 10*3/uL (ref 0.7–4.0)
MCH: 30.4 pg (ref 26.0–34.0)
MCHC: 33.1 g/dL (ref 30.0–36.0)
MCV: 91.7 fL (ref 78.0–100.0)
MPV: 9.9 fL (ref 8.6–12.4)
Monocytes Absolute: 0.6 10*3/uL (ref 0.1–1.0)
Monocytes Relative: 5 % (ref 3–12)
Neutro Abs: 9.9 10*3/uL — ABNORMAL HIGH (ref 1.7–7.7)
Neutrophils Relative %: 81 % — ABNORMAL HIGH (ref 43–77)
Platelets: 247 10*3/uL (ref 150–400)
RBC: 3.72 MIL/uL — AB (ref 3.87–5.11)
RDW: 12.6 % (ref 11.5–15.5)
RH TYPE: POSITIVE
Rubella: 0.64 Index (ref <0.90)
WBC: 12.2 10*3/uL — ABNORMAL HIGH (ref 4.0–10.5)

## 2016-01-10 LAB — GLUCOSE TOLERANCE, 1 HOUR (50G) W/O FASTING: GLUCOSE, 1 HR, GESTATIONAL: 127 mg/dL (ref <140)

## 2016-01-11 LAB — PRESCRIPTION MONITORING PROFILE (19 PANEL)
Amphetamine/Meth: NEGATIVE ng/mL
BARBITURATE SCREEN, URINE: NEGATIVE ng/mL
BENZODIAZEPINE SCREEN, URINE: NEGATIVE ng/mL
Buprenorphine, Urine: NEGATIVE ng/mL
CREATININE, URINE: 85.76 mg/dL (ref >20.0)
Cannabinoid Scrn, Ur: NEGATIVE ng/mL
Carisoprodol, Urine: NEGATIVE ng/mL
Cocaine Metabolites: NEGATIVE ng/mL
ECSTASY: NEGATIVE ng/mL
FENTANYL URINE: NEGATIVE ng/mL
Meperidine, Ur: NEGATIVE ng/mL
Methadone Screen, Urine: NEGATIVE ng/mL
Methaqualone: NEGATIVE ng/mL
Nitrites, Initial: NEGATIVE ug/mL
OPIATE SCREEN, URINE: NEGATIVE ng/mL
OXYCODONE SCRN UR: NEGATIVE ng/mL
PH URINE, INITIAL: 7.6 pH (ref 4.5–8.9)
Phencyclidine, Ur: NEGATIVE ng/mL
Propoxyphene: NEGATIVE ng/mL
Tapentadol, urine: NEGATIVE ng/mL
Tramadol Scrn, Ur: NEGATIVE ng/mL
Zolpidem, Urine: NEGATIVE ng/mL

## 2016-01-11 LAB — CULTURE, OB URINE
Colony Count: NO GROWTH
Organism ID, Bacteria: NO GROWTH

## 2016-01-11 LAB — HEMOGLOBINOPATHY EVALUATION
HEMOGLOBIN OTHER: 0 %
HGB F QUANT: 0 % (ref 0.0–2.0)
HGB S QUANTITAION: 0 %
Hgb A2 Quant: 2.6 % (ref 2.2–3.2)
Hgb A: 97.4 % (ref 96.8–97.8)

## 2016-01-21 ENCOUNTER — Ambulatory Visit (HOSPITAL_COMMUNITY)
Admission: RE | Admit: 2016-01-21 | Discharge: 2016-01-21 | Disposition: A | Discharge status: Home / Self Care | Payer: Medicaid Other | Source: Ambulatory Visit | Attending: Advanced Practice Midwife | Admitting: Advanced Practice Midwife

## 2016-01-21 ENCOUNTER — Other Ambulatory Visit: Payer: Self-pay | Admitting: Advanced Practice Midwife

## 2016-01-21 ENCOUNTER — Ambulatory Visit (HOSPITAL_COMMUNITY)
Admission: RE | Admit: 2016-01-21 | Discharge: 2016-01-21 | Disposition: A | Discharge status: Home / Self Care | Payer: Medicaid Other | Source: Ambulatory Visit | Attending: Maternal and Fetal Medicine | Admitting: Maternal and Fetal Medicine

## 2016-01-21 ENCOUNTER — Encounter (HOSPITAL_COMMUNITY): Payer: Self-pay

## 2016-01-21 DIAGNOSIS — Z369 Encounter for antenatal screening, unspecified: Secondary | ICD-10-CM

## 2016-01-21 DIAGNOSIS — O10919 Unspecified pre-existing hypertension complicating pregnancy, unspecified trimester: Secondary | ICD-10-CM

## 2016-01-21 DIAGNOSIS — O09899 Supervision of other high risk pregnancies, unspecified trimester: Secondary | ICD-10-CM

## 2016-01-21 DIAGNOSIS — O10011 Pre-existing essential hypertension complicating pregnancy, first trimester: Secondary | ICD-10-CM | POA: Insufficient documentation

## 2016-01-21 DIAGNOSIS — Z3A12 12 weeks gestation of pregnancy: Secondary | ICD-10-CM

## 2016-01-21 DIAGNOSIS — Z36 Encounter for antenatal screening of mother: Secondary | ICD-10-CM | POA: Diagnosis not present

## 2016-02-01 ENCOUNTER — Encounter: Payer: Self-pay | Admitting: *Deleted

## 2016-02-01 DIAGNOSIS — O09899 Supervision of other high risk pregnancies, unspecified trimester: Secondary | ICD-10-CM

## 2016-02-07 ENCOUNTER — Ambulatory Visit (INDEPENDENT_AMBULATORY_CARE_PROVIDER_SITE_OTHER): Payer: Medicaid Other | Admitting: Obstetrics & Gynecology

## 2016-02-07 VITALS — BP 132/81 | HR 80 | Temp 98.7°F | Wt 215.8 lb

## 2016-02-07 DIAGNOSIS — O10912 Unspecified pre-existing hypertension complicating pregnancy, second trimester: Secondary | ICD-10-CM

## 2016-02-07 DIAGNOSIS — O10919 Unspecified pre-existing hypertension complicating pregnancy, unspecified trimester: Secondary | ICD-10-CM

## 2016-02-07 DIAGNOSIS — Z1389 Encounter for screening for other disorder: Secondary | ICD-10-CM

## 2016-02-07 DIAGNOSIS — O09892 Supervision of other high risk pregnancies, second trimester: Secondary | ICD-10-CM

## 2016-02-07 LAB — COMPREHENSIVE METABOLIC PANEL
ALK PHOS: 73 U/L (ref 33–115)
ALT: 31 U/L — ABNORMAL HIGH (ref 6–29)
AST: 18 U/L (ref 10–30)
Albumin: 3.3 g/dL — ABNORMAL LOW (ref 3.6–5.1)
BILIRUBIN TOTAL: 0.3 mg/dL (ref 0.2–1.2)
BUN: 5 mg/dL — ABNORMAL LOW (ref 7–25)
CHLORIDE: 105 mmol/L (ref 98–110)
CO2: 24 mmol/L (ref 20–31)
CREATININE: 0.55 mg/dL (ref 0.50–1.10)
Calcium: 8.7 mg/dL (ref 8.6–10.2)
Glucose, Bld: 91 mg/dL (ref 65–99)
Potassium: 4.1 mmol/L (ref 3.5–5.3)
SODIUM: 137 mmol/L (ref 135–146)
Total Protein: 6.2 g/dL (ref 6.1–8.1)

## 2016-02-07 LAB — POCT URINALYSIS DIP (DEVICE)
BILIRUBIN URINE: NEGATIVE
Glucose, UA: NEGATIVE mg/dL
Ketones, ur: NEGATIVE mg/dL
Leukocytes, UA: NEGATIVE
NITRITE: NEGATIVE
PH: 6 (ref 5.0–8.0)
Protein, ur: NEGATIVE mg/dL
Specific Gravity, Urine: 1.02 (ref 1.005–1.030)
Urobilinogen, UA: 0.2 mg/dL (ref 0.0–1.0)

## 2016-02-07 LAB — CBC
HCT: 34.8 % — ABNORMAL LOW (ref 36.0–46.0)
Hemoglobin: 11.6 g/dL — ABNORMAL LOW (ref 12.0–15.0)
MCH: 30.7 pg (ref 26.0–34.0)
MCHC: 33.3 g/dL (ref 30.0–36.0)
MCV: 92.1 fL (ref 78.0–100.0)
MPV: 9.7 fL (ref 8.6–12.4)
PLATELETS: 237 10*3/uL (ref 150–400)
RBC: 3.78 MIL/uL — ABNORMAL LOW (ref 3.87–5.11)
RDW: 12.6 % (ref 11.5–15.5)
WBC: 11.7 10*3/uL — ABNORMAL HIGH (ref 4.0–10.5)

## 2016-02-07 NOTE — Progress Notes (Signed)
Subjective:  Anna GenreJessica L Estabrooks is a 30 y.o. G2P1001 at 3220w2d being seen today for ongoing prenatal care.  She is currently monitored for the following issues for this high-risk pregnancy and has Supervision of other high risk pregnancy, antepartum; Chronic hypertension during pregnancy, antepartum; and Rubella non-immune status, antepartum on her problem list.  Patient reports no complaints.  Contractions: Not present. Vag. Bleeding: None.  Movement: Present. Denies leaking of fluid.   The following portions of the patient's history were reviewed and updated as appropriate: allergies, current medications, past family history, past medical history, past social history, past surgical history and problem list. Problem list updated.  Objective:   Filed Vitals:   02/07/16 1043  BP: 132/81  Pulse: 80  Temp: 98.7 F (37.1 C)  Weight: 215 lb 12.8 oz (97.886 kg)    Fetal Status: Fetal Heart Rate (bpm): 149   Movement: Present     General:  Alert, oriented and cooperative. Patient is in no acute distress.  Skin: Skin is warm and dry. No rash noted.   Cardiovascular: Normal heart rate noted  Respiratory: Normal respiratory effort, no problems with respiration noted  Abdomen: Soft, gravid, appropriate for gestational age. Pain/Pressure: Present     Pelvic: Vag. Bleeding: None    Cervical exam deferred        Extremities: Normal range of motion.  Edema: None  Mental Status: Normal mood and affect. Normal behavior. Normal judgment and thought content.   Urinalysis: Urine Protein: Negative Urine Glucose: Negative  Assessment and Plan:  Pregnancy: G2P1001 at 2820w2d  1. Chronic hypertension during pregnancy, antepartum Discussed need to optimize BP control, need for antenatal testing at 32 weeks or earlier if needed etc - Comprehensive metabolic panel - Protein / creatinine ratio, urine - CBC - US MFM OB COMP + 14 WK; Future - Protein, urine, 24 hour - Creatinine clearance, urine, 24 hour  2.  Encounter for routine screening for malformation using ultrasonics 3. Supervision of other high risk pregnancy, antepartum, second trimester - Alpha fetoprotein, maternal; already had a normal 1st trimester screen - US MFM OB COMP + 14 WK; Future  Routine obstetric precautions reviewed. Please refer to After Visit Summary for other counseling recommendations.  Return in about 4 weeks (around 03/06/2016) for OB Visit.   Tereso NewcomerUgonna A Shahin Knierim, MD

## 2016-02-07 NOTE — Patient Instructions (Signed)

## 2016-02-07 NOTE — Progress Notes (Signed)
Trace Hgb

## 2016-02-08 ENCOUNTER — Other Ambulatory Visit (HOSPITAL_COMMUNITY): Payer: Self-pay

## 2016-02-08 LAB — ALPHA FETOPROTEIN, MATERNAL
AFP: 45.4 ng/mL
Curr Gest Age: 15.2 wks.days
MoM for AFP: 1.89
Open Spina bifida: NEGATIVE
Osb Risk: 1:992 {titer}

## 2016-02-15 LAB — PROTEIN / CREATININE RATIO, URINE
Creatinine, Urine: 129 mg/dL (ref 20–320)
Protein Creatinine Ratio: 93 mg/g creat (ref 21–161)
Total Protein, Urine: 12 mg/dL (ref 5–24)

## 2016-02-15 LAB — CREATININE CLEARANCE, URINE, 24 HOUR
CREATININE 24H UR: 2 g/(24.h) (ref 0.63–2.50)
CREATININE, URINE: 129 mg/dL (ref 20–320)
CREATININE: 0.54 mg/dL (ref 0.50–1.10)
Creatinine Clearance: 257 mL/min — ABNORMAL HIGH (ref 75–115)

## 2016-02-15 LAB — PROTEIN, URINE, 24 HOUR
PROTEIN 24H UR: 186 mg/(24.h) — AB (ref <150)
PROTEIN, URINE: 12 mg/dL (ref 5–24)

## 2016-02-29 ENCOUNTER — Encounter (HOSPITAL_COMMUNITY): Payer: Self-pay

## 2016-02-29 ENCOUNTER — Other Ambulatory Visit: Payer: Self-pay | Admitting: Obstetrics & Gynecology

## 2016-02-29 ENCOUNTER — Ambulatory Visit (HOSPITAL_COMMUNITY)
Admission: RE | Admit: 2016-02-29 | Discharge: 2016-02-29 | Disposition: A | Discharge status: Home / Self Care | Payer: Medicaid Other | Source: Ambulatory Visit | Attending: Obstetrics & Gynecology | Admitting: Obstetrics & Gynecology

## 2016-02-29 DIAGNOSIS — Z3A18 18 weeks gestation of pregnancy: Secondary | ICD-10-CM | POA: Insufficient documentation

## 2016-02-29 DIAGNOSIS — O10919 Unspecified pre-existing hypertension complicating pregnancy, unspecified trimester: Secondary | ICD-10-CM

## 2016-02-29 DIAGNOSIS — J45909 Unspecified asthma, uncomplicated: Secondary | ICD-10-CM

## 2016-02-29 DIAGNOSIS — O99212 Obesity complicating pregnancy, second trimester: Secondary | ICD-10-CM

## 2016-02-29 DIAGNOSIS — O99512 Diseases of the respiratory system complicating pregnancy, second trimester: Secondary | ICD-10-CM | POA: Insufficient documentation

## 2016-02-29 DIAGNOSIS — O10012 Pre-existing essential hypertension complicating pregnancy, second trimester: Secondary | ICD-10-CM | POA: Insufficient documentation

## 2016-02-29 DIAGNOSIS — Z1389 Encounter for screening for other disorder: Secondary | ICD-10-CM

## 2016-02-29 DIAGNOSIS — Z363 Encounter for antenatal screening for malformations: Secondary | ICD-10-CM

## 2016-02-29 DIAGNOSIS — O09892 Supervision of other high risk pregnancies, second trimester: Secondary | ICD-10-CM

## 2016-03-06 ENCOUNTER — Ambulatory Visit (INDEPENDENT_AMBULATORY_CARE_PROVIDER_SITE_OTHER): Payer: Medicaid Other | Admitting: Family

## 2016-03-06 VITALS — BP 127/88 | HR 99 | Temp 98.9°F | Wt 220.0 lb

## 2016-03-06 DIAGNOSIS — O09892 Supervision of other high risk pregnancies, second trimester: Secondary | ICD-10-CM

## 2016-03-06 DIAGNOSIS — O10919 Unspecified pre-existing hypertension complicating pregnancy, unspecified trimester: Secondary | ICD-10-CM

## 2016-03-06 DIAGNOSIS — O10912 Unspecified pre-existing hypertension complicating pregnancy, second trimester: Secondary | ICD-10-CM | POA: Diagnosis not present

## 2016-03-06 LAB — POCT URINALYSIS DIP (DEVICE)
BILIRUBIN URINE: NEGATIVE
Glucose, UA: NEGATIVE mg/dL
Ketones, ur: NEGATIVE mg/dL
Leukocytes, UA: NEGATIVE
NITRITE: NEGATIVE
PH: 6.5 (ref 5.0–8.0)
Protein, ur: NEGATIVE mg/dL
SPECIFIC GRAVITY, URINE: 1.01 (ref 1.005–1.030)
Urobilinogen, UA: 0.2 mg/dL (ref 0.0–1.0)

## 2016-03-06 NOTE — Progress Notes (Signed)
Subjective:  Shirley Terrell is a 30 y.o. G2P1001 at 7526w2d being seen today for ongoing prenatal care.  She is currently monitored for the following issues for this high-risk pregnancy and has Supervision of other high risk pregnancy, antepartum; Chronic hypertension during pregnancy, antepartum; and Rubella non-immune status, antepartum on her problem list.  Patient reports no complaints.   .  .  Movement: Present. Denies leaking of fluid.   The following portions of the patient's history were reviewed and updated as appropriate: allergies, current medications, past family history, past medical history, past social history, past surgical history and problem list. Problem list updated.  Objective:   Filed Vitals:   03/06/16 1117  BP: 127/88  Pulse: 99  Temp: 98.9 F (37.2 C)  Weight: 220 lb (99.791 kg)    Fetal Status: Fetal Heart Rate (bpm): 147 Fundal Height: 20 cm Movement: Present     General:  Alert, oriented and cooperative. Patient is in no acute distress.  Skin: Skin is warm and dry. No rash noted.   Cardiovascular: Normal heart rate noted  Respiratory: Normal respiratory effort, no problems with respiration noted  Abdomen: Soft, gravid, appropriate for gestational age. Pain/Pressure: Present     Pelvic:       Cervical exam deferred        Extremities: Normal range of motion.     Mental Status: Normal mood and affect. Normal behavior. Normal judgment and thought content.   Urinalysis: Urine Protein: Negative Urine Glucose: Negative  Assessment and Plan:  Pregnancy: G2P1001 at 3426w2d  1. Supervision of other high risk pregnancy, antepartum, second trimester - Continue close observation  2. Chronic hypertension during pregnancy, antepartum - No meds at this times - Growth ultrasound already scheduled  Preterm labor symptoms and general obstetric precautions including but not limited to vaginal bleeding, contractions, leaking of fluid and fetal movement were reviewed in  detail with the patient. Please refer to After Visit Summary for other counseling recommendations.  Return in about 3 weeks (around 03/27/2016).   Eino FarberWalidah Kennith GainN Karim, CNM

## 2016-04-04 ENCOUNTER — Ambulatory Visit (INDEPENDENT_AMBULATORY_CARE_PROVIDER_SITE_OTHER): Payer: Medicaid Other | Admitting: Family Medicine

## 2016-04-04 VITALS — BP 126/81 | HR 91 | Wt 223.0 lb

## 2016-04-04 DIAGNOSIS — O9989 Other specified diseases and conditions complicating pregnancy, childbirth and the puerperium: Secondary | ICD-10-CM

## 2016-04-04 DIAGNOSIS — O10919 Unspecified pre-existing hypertension complicating pregnancy, unspecified trimester: Secondary | ICD-10-CM

## 2016-04-04 DIAGNOSIS — Z2839 Other underimmunization status: Secondary | ICD-10-CM

## 2016-04-04 DIAGNOSIS — O10912 Unspecified pre-existing hypertension complicating pregnancy, second trimester: Secondary | ICD-10-CM | POA: Diagnosis not present

## 2016-04-04 DIAGNOSIS — O09892 Supervision of other high risk pregnancies, second trimester: Secondary | ICD-10-CM

## 2016-04-04 DIAGNOSIS — Z283 Underimmunization status: Secondary | ICD-10-CM | POA: Diagnosis not present

## 2016-04-04 DIAGNOSIS — M546 Pain in thoracic spine: Secondary | ICD-10-CM | POA: Diagnosis not present

## 2016-04-04 LAB — POCT URINALYSIS DIP (DEVICE)
BILIRUBIN URINE: NEGATIVE
Glucose, UA: NEGATIVE mg/dL
Ketones, ur: NEGATIVE mg/dL
Leukocytes, UA: NEGATIVE
Nitrite: NEGATIVE
PROTEIN: NEGATIVE mg/dL
Specific Gravity, Urine: 1.015 (ref 1.005–1.030)
UROBILINOGEN UA: 1 mg/dL (ref 0.0–1.0)
pH: 7 (ref 5.0–8.0)

## 2016-04-04 NOTE — Progress Notes (Signed)
Subjective:  Shirley Terrell is a 30 y.o. G2P1001 at 6773w3d being seen today for ongoing prenatal care.  She is currently monitored for the following issues for this high-risk pregnancy and has Supervision of other high risk pregnancy, antepartum; Chronic hypertension during pregnancy, antepartum; and Rubella non-immune status, antepartum on her problem list.  Patient reports backache in center of back.  Nonradiating.  Contractions: Not present. Vag. Bleeding: None.  Movement: Present. Denies leaking of fluid.   The following portions of the patient's history were reviewed and updated as appropriate: allergies, current medications, past family history, past medical history, past social history, past surgical history and problem list. Problem list updated.  Objective:   Filed Vitals:   04/04/16 1031  BP: 126/81  Pulse: 91  Weight: 223 lb (101.152 kg)    Fetal Status: Fetal Heart Rate (bpm): 153   Movement: Present     General:  Alert, oriented and cooperative. Patient is in no acute distress.  Skin: Skin is warm and dry. No rash noted.   Cardiovascular: Normal heart rate noted  Respiratory: Normal respiratory effort, no problems with respiration noted  Abdomen: Soft, gravid, appropriate for gestational age. Pain/Pressure: Present     Pelvic: Vag. Bleeding: None     Cervical exam deferred        MSK: Tenderness to lumbar spine, more on right.  Hypertonic paraspinals  OSE: L5 FSRR, L/L torsion, right ant innom.  Extremities: Normal range of motion.  Edema: Trace  Mental Status: Normal mood and affect. Normal behavior. Normal judgment and thought content.   Urinalysis: Urine Protein: Negative Urine Glucose: Negative  Assessment and Plan:  Pregnancy: G2P1001 at 7173w3d  1. Supervision of other high risk pregnancy, antepartum, second trimester FHT and FH normal  2. Chronic hypertension during pregnancy, antepartum BP controlled  3. Rubella non-immune status, antepartum  4.  Back  pain in pregnancy OMT done after pt permission.  Patient had improvement of symptoms. Preterm labor symptoms and general obstetric precautions including but not limited to vaginal bleeding, contractions, leaking of fluid and fetal movement were reviewed in detail with the patient. Please refer to After Visit Summary for other counseling recommendations.  Return in about 4 weeks (around 05/02/2016).   Levie HeritageJacob J Stinson, DO

## 2016-04-04 NOTE — Progress Notes (Signed)
Patient reports pelvic pressure & lower back pain. Reviewed tip of week with patient

## 2016-04-11 ENCOUNTER — Encounter (HOSPITAL_COMMUNITY): Payer: Self-pay

## 2016-04-11 ENCOUNTER — Ambulatory Visit (HOSPITAL_COMMUNITY)
Admission: RE | Admit: 2016-04-11 | Discharge: 2016-04-11 | Disposition: A | Discharge status: Home / Self Care | Payer: Medicaid Other | Source: Ambulatory Visit | Attending: Family | Admitting: Family

## 2016-04-11 VITALS — BP 130/81 | HR 92 | Wt 224.0 lb

## 2016-04-11 DIAGNOSIS — O99512 Diseases of the respiratory system complicating pregnancy, second trimester: Secondary | ICD-10-CM | POA: Diagnosis not present

## 2016-04-11 DIAGNOSIS — O99212 Obesity complicating pregnancy, second trimester: Secondary | ICD-10-CM | POA: Diagnosis not present

## 2016-04-11 DIAGNOSIS — Z3A24 24 weeks gestation of pregnancy: Secondary | ICD-10-CM | POA: Diagnosis not present

## 2016-04-11 DIAGNOSIS — J45909 Unspecified asthma, uncomplicated: Secondary | ICD-10-CM | POA: Diagnosis not present

## 2016-04-11 DIAGNOSIS — Z2839 Other underimmunization status: Secondary | ICD-10-CM

## 2016-04-11 DIAGNOSIS — O10012 Pre-existing essential hypertension complicating pregnancy, second trimester: Secondary | ICD-10-CM | POA: Insufficient documentation

## 2016-04-11 DIAGNOSIS — O09892 Supervision of other high risk pregnancies, second trimester: Secondary | ICD-10-CM

## 2016-04-11 DIAGNOSIS — Z283 Underimmunization status: Secondary | ICD-10-CM

## 2016-04-11 DIAGNOSIS — O9989 Other specified diseases and conditions complicating pregnancy, childbirth and the puerperium: Secondary | ICD-10-CM

## 2016-04-11 DIAGNOSIS — O10919 Unspecified pre-existing hypertension complicating pregnancy, unspecified trimester: Secondary | ICD-10-CM

## 2016-04-30 ENCOUNTER — Ambulatory Visit (INDEPENDENT_AMBULATORY_CARE_PROVIDER_SITE_OTHER): Payer: Medicaid Other | Admitting: Medical

## 2016-04-30 VITALS — BP 125/88 | HR 110 | Wt 225.6 lb

## 2016-04-30 DIAGNOSIS — O09892 Supervision of other high risk pregnancies, second trimester: Secondary | ICD-10-CM

## 2016-04-30 DIAGNOSIS — O10912 Unspecified pre-existing hypertension complicating pregnancy, second trimester: Secondary | ICD-10-CM

## 2016-04-30 LAB — POCT URINALYSIS DIP (DEVICE)
Bilirubin Urine: NEGATIVE
Glucose, UA: NEGATIVE mg/dL
Ketones, ur: NEGATIVE mg/dL
Nitrite: NEGATIVE
Protein, ur: NEGATIVE mg/dL
Specific Gravity, Urine: 1.01 (ref 1.005–1.030)
Urobilinogen, UA: 0.2 mg/dL (ref 0.0–1.0)
pH: 7 (ref 5.0–8.0)

## 2016-04-30 LAB — CBC
HCT: 33.6 % — ABNORMAL LOW (ref 35.0–45.0)
Hemoglobin: 11.1 g/dL — ABNORMAL LOW (ref 11.7–15.5)
MCH: 30.6 pg (ref 27.0–33.0)
MCHC: 33 g/dL (ref 32.0–36.0)
MCV: 92.6 fL (ref 80.0–100.0)
MPV: 9.6 fL (ref 7.5–12.5)
PLATELETS: 229 10*3/uL (ref 140–400)
RBC: 3.63 MIL/uL — AB (ref 3.80–5.10)
RDW: 12.8 % (ref 11.0–15.0)
WBC: 13.4 10*3/uL — ABNORMAL HIGH (ref 3.8–10.8)

## 2016-04-30 NOTE — Patient Instructions (Signed)
Hypertension During Pregnancy °Hypertension is also called high blood pressure. Blood pressure moves blood in your body. Sometimes, the force that moves the blood becomes too strong. When you are pregnant, this condition should be watched carefully. It can cause problems for you and your baby. °HOME CARE  °· Make and keep all of your doctor visits. °· Take medicine as told by your doctor. Tell your doctor about all medicines you take. °· Eat very little salt. °· Exercise regularly. °· Do not drink alcohol. °· Do not smoke. °· Do not have drinks with caffeine. °· Lie on your left side when resting. °· Your health care provider may ask you to take one low-dose aspirin (81mg) each day. °GET HELP RIGHT AWAY IF: °· You have bad belly (abdominal) pain. °· You have sudden puffiness (swelling) in the hands, ankles, or face. °· You gain 4 pounds (1.8 kilograms) or more in 1 week. °· You throw up (vomit) repeatedly. °· You have bleeding from the vagina. °· You do not feel the baby moving as much. °· You have a headache. °· You have blurred or double vision. °· You have muscle twitching or spasms. °· You have shortness of breath. °· You have blue fingernails and lips. °· You have blood in your pee (urine). °MAKE SURE YOU: °· Understand these instructions. °· Will watch your condition. °· Will get help right away if you are not doing well or get worse. °  °This information is not intended to replace advice given to you by your health care provider. Make sure you discuss any questions you have with your health care provider. °  °Document Released: 12/06/2010 Document Revised: 11/24/2014 Document Reviewed: 06/02/2013 °Elsevier Interactive Patient Education ©2016 Elsevier Inc. ° °

## 2016-04-30 NOTE — Progress Notes (Signed)
  Subjective:  Shirley Terrell is a 30 y.o. G2P1001 at 1048w1d being seen today for ongoing prenatal care.  She is currently monitored for the following issues for this high-risk pregnancy and has Supervision of other high risk pregnancy, antepartum; Chronic hypertension during pregnancy, antepartum; and Rubella non-immune status, antepartum on her problem list.  Patient reports no complaints.  Contractions: Not present. Vag. Bleeding: None.  Movement: Present. Denies leaking of fluid.   The following portions of the patient's history were reviewed and updated as appropriate: allergies, current medications, past family history, past medical history, past social history, past surgical history and problem list. Problem list updated.  Objective:   Filed Vitals:   04/30/16 1010  BP: 125/88  Pulse: 110  Weight: 225 lb 9.6 oz (102.331 kg)    Fetal Status: Fetal Heart Rate (bpm): 155 Fundal Height: 28 cm Movement: Present     General:  Alert, oriented and cooperative. Patient is in no acute distress.  Skin: Skin is warm and dry. No rash noted.   Cardiovascular: Normal heart rate noted  Respiratory: Normal respiratory effort, no problems with respiration noted  Abdomen: Soft, gravid, appropriate for gestational age. Pain/Pressure: Absent     Pelvic: Cervical exam deferred        Extremities: Normal range of motion.  Edema: Trace  Mental Status: Normal mood and affect. Normal behavior. Normal judgment and thought content.   Urinalysis: Urine Protein: Negative Urine Glucose: Negative  Assessment and Plan:  Pregnancy: G2P1001 at 10748w1d  1. Supervision of other high risk pregnancy, antepartum, second trimester - Glucose Tolerance, 1 HR (50g) w/o Fasting - CBC - RPR - HIV antibody (with reflex) - Blood pressure - well controlled without medications at this time - patient without headache, blurred vision, floaters, RUQ abdominal pain, mild intermittent edema that resolves with rest - negative  proteinuria  - Has follow-up growth scheduled for next week, will need next US scheduled at 32 weeks  Preterm labor symptoms and general obstetric precautions including but not limited to vaginal bleeding, contractions, leaking of fluid and fetal movement were reviewed in detail with the patient. Please refer to After Visit Summary for other counseling recommendations.  [facility] about 2 weeks (around 05[facility]14/2016[facility]   Marny LowensteinJulie N Wenzel, PA-C

## 2016-04-30 NOTE — Progress Notes (Signed)
Breastfeeding tip reviewed 28 week labs today

## 2016-05-01 LAB — RPR

## 2016-05-01 LAB — HIV ANTIBODY (ROUTINE TESTING W REFLEX): HIV 1&2 Ab, 4th Generation: NONREACTIVE

## 2016-05-03 LAB — GLUCOSE TOLERANCE, 1 HOUR (50G) W/O FASTING: Glucose, 1 Hr, gestational: 126 mg/dL (ref <140)

## 2016-05-08 ENCOUNTER — Ambulatory Visit (HOSPITAL_COMMUNITY)
Admission: RE | Admit: 2016-05-08 | Discharge: 2016-05-08 | Disposition: A | Discharge status: Home / Self Care | Payer: Medicaid Other | Source: Ambulatory Visit | Attending: Family | Admitting: Family

## 2016-05-08 ENCOUNTER — Encounter (HOSPITAL_COMMUNITY): Payer: Self-pay

## 2016-05-08 VITALS — BP 105/80 | HR 114 | Wt 223.4 lb

## 2016-05-08 DIAGNOSIS — O10012 Pre-existing essential hypertension complicating pregnancy, second trimester: Secondary | ICD-10-CM | POA: Diagnosis present

## 2016-05-08 DIAGNOSIS — O99212 Obesity complicating pregnancy, second trimester: Secondary | ICD-10-CM | POA: Insufficient documentation

## 2016-05-08 DIAGNOSIS — O10919 Unspecified pre-existing hypertension complicating pregnancy, unspecified trimester: Secondary | ICD-10-CM

## 2016-05-08 DIAGNOSIS — Z3A28 28 weeks gestation of pregnancy: Secondary | ICD-10-CM | POA: Insufficient documentation

## 2016-05-08 DIAGNOSIS — Z283 Underimmunization status: Secondary | ICD-10-CM

## 2016-05-08 DIAGNOSIS — O09899 Supervision of other high risk pregnancies, unspecified trimester: Secondary | ICD-10-CM

## 2016-05-08 DIAGNOSIS — O9989 Other specified diseases and conditions complicating pregnancy, childbirth and the puerperium: Secondary | ICD-10-CM

## 2016-05-09 ENCOUNTER — Other Ambulatory Visit (HOSPITAL_COMMUNITY): Payer: Self-pay

## 2016-05-09 DIAGNOSIS — O10919 Unspecified pre-existing hypertension complicating pregnancy, unspecified trimester: Secondary | ICD-10-CM

## 2016-05-13 ENCOUNTER — Ambulatory Visit (INDEPENDENT_AMBULATORY_CARE_PROVIDER_SITE_OTHER): Payer: Medicaid Other | Admitting: Student

## 2016-05-13 VITALS — BP 131/89 | HR 100 | Wt 223.6 lb

## 2016-05-13 DIAGNOSIS — O10912 Unspecified pre-existing hypertension complicating pregnancy, second trimester: Secondary | ICD-10-CM

## 2016-05-13 DIAGNOSIS — O10919 Unspecified pre-existing hypertension complicating pregnancy, unspecified trimester: Secondary | ICD-10-CM

## 2016-05-13 LAB — POCT URINALYSIS DIP (DEVICE)
BILIRUBIN URINE: NEGATIVE
Glucose, UA: NEGATIVE mg/dL
Ketones, ur: NEGATIVE mg/dL
NITRITE: NEGATIVE
PH: 7 (ref 5.0–8.0)
Protein, ur: NEGATIVE mg/dL
Specific Gravity, Urine: 1.02 (ref 1.005–1.030)
UROBILINOGEN UA: 1 mg/dL (ref 0.0–1.0)

## 2016-05-13 NOTE — Patient Instructions (Signed)
Hypertension During Pregnancy Hypertension is also called high blood pressure. Blood pressure moves blood in your body. Sometimes, the force that moves the blood becomes too strong. When you are pregnant, this condition should be watched carefully. It can cause problems for you and your baby. HOME CARE   Make and keep all of your doctor visits.  Take medicine as told by your doctor. Tell your doctor about all medicines you take.  Eat very little salt.  Exercise regularly.  Do not drink alcohol.  Do not smoke.  Do not have drinks with caffeine.  Lie on your left side when resting.  Your health care provider may ask you to take one low-dose aspirin (81mg ) each day. GET HELP RIGHT AWAY IF:  You have bad belly (abdominal) pain.  You have sudden puffiness (swelling) in the hands, ankles, or face.  You gain 4 pounds (1.8 kilograms) or more in 1 week.  You throw up (vomit) repeatedly.  You have bleeding from the vagina.  You do not feel the baby moving as much.  You have a headache.  You have blurred or double vision.  You have muscle twitching or spasms.  You have shortness of breath.  You have blue fingernails and lips.  You have blood in your pee (urine). MAKE SURE YOU:  Understand these instructions.  Will watch your condition.  Will get help right away if you are not doing well or get worse.   This information is not intended to replace advice given to you by your health care provider. Make sure you discuss any questions you have with your health care provider.   Document Released: 12/06/2010 Document Revised: 11/24/2014 Document Reviewed: 06/02/2013 Elsevier Interactive Patient Education [employer]2016 Elsevier Inc.     Third Trimester of Pregnancy The third trimester is from week 29 through week 42, months 7 through 9. This trimester is when your unborn baby (fetus) is growing very fast. At the end of the ninth month, the unborn baby is about 20 inches in length.  It weighs about 6-10 pounds.  HOME CARE   Avoid all smoking, herbs, and alcohol. Avoid drugs not approved by your doctor.  Do not use any tobacco products, including cigarettes, chewing tobacco, and electronic cigarettes. If you need help quitting, ask your doctor. You may get counseling or other support to help you quit.  Only take medicine as told by your doctor. Some medicines are safe and some are not during pregnancy.  Exercise only as told by your doctor. Stop exercising if you start having cramps.  Eat regular, healthy meals.  Wear a good support bra if your breasts are tender.  Do not use hot tubs, steam rooms, or saunas.  Wear your seat belt when driving.  Avoid raw meat, uncooked cheese, and liter boxes and soil used by cats.  Take your prenatal vitamins.  Take 1500-2000 milligrams of calcium daily starting at the 20th week of pregnancy until you deliver your baby.  Try taking medicine that helps you poop (stool softener) as needed, and if your doctor approves. Eat more fiber by eating fresh fruit, vegetables, and whole grains. Drink enough fluids to keep your pee (urine) clear or pale yellow.  Take warm water baths (sitz baths) to soothe pain or discomfort caused by hemorrhoids. Use hemorrhoid cream if your doctor approves.  If you have puffy, bulging veins (varicose veins), wear support hose. Raise (elevate) your feet for 15 minutes, 3-4 times a day. Limit salt in your diet.  Avoid heavy lifting, wear low heels, and sit up straight.  Rest with your legs raised if you have leg cramps or low back pain.  Visit your dentist if you have not gone during your pregnancy. Use a soft toothbrush to brush your teeth. Be gentle when you floss.  You can have sex (intercourse) unless your doctor tells you not to.  Do not travel far distances unless you must. Only do so with your doctor's approval.  Take prenatal classes.  Practice driving to the hospital.  Pack your hospital  bag.  Prepare the baby's room.  Go to your doctor visits. GET HELP IF:  You are not sure if you are in labor or if your water has broken.  You are dizzy.  You have mild cramps or pressure in your lower belly (abdominal).  You have a nagging pain in your belly area.  You continue to feel sick to your stomach (nauseous), throw up (vomit), or have watery poop (diarrhea).  You have bad smelling fluid coming from your vagina.  You have pain with peeing (urination). GET HELP RIGHT AWAY IF:   You have a fever.  You are leaking fluid from your vagina.  You are spotting or bleeding from your vagina.  You have severe belly cramping or pain.  You lose or gain weight rapidly.  You have trouble catching your breath and have chest pain.  You notice sudden or extreme puffiness (swelling) of your face, hands, ankles, feet, or legs.  You have not felt the baby move in over an hour.  You have severe headaches that do not go away with medicine.  You have vision changes.   This information is not intended to replace advice given to you by your health care provider. Make sure you discuss any questions you have with your health care provider.   Document Released: 01/28/2010 Document Revised: 11/24/2014 Document Reviewed: 01/04/2013 Elsevier Interactive Patient Education [employer] Elsevier Inc.

## 2016-05-13 NOTE — Progress Notes (Signed)
Subjective:  Shirley Terrell is a 30 y.o. G2P1001 at 5980w0d being seen today for ongoing prenatal care.  She is currently monitored for the following issues for this high-risk pregnancy and has Supervision of other high risk pregnancy, antepartum; Chronic hypertension during pregnancy, antepartum; and Rubella non-immune status, antepartum on her problem list.  Patient reports no bleeding, no leaking and occasional contractions. Vag. Bleeding: None.  Movement: Present. Denies leaking of fluid.   Patient describes intense anxiety surrounding the preparation and birth of her daughter, far more than was noted during her prior pregnancy by her family. Patient denies a personal history of depression and anxiety. Denies SI/HI.  The following portions of the patient's history were reviewed and updated as appropriate: allergies, current medications, past family history, past medical history, past social history, past surgical history and problem list. Problem list updated.  Objective:   Filed Vitals:   05/13/16 0926  BP: 131/89  Pulse: 130  Weight: 223 lb 9.6 oz (101.424 kg)    Fetal Status: Fetal Heart Rate (bpm): 134   Movement: Present     General:  Alert, oriented and cooperative. Patient is in no acute distress.  Skin: Skin is warm and dry. No rash noted.   Cardiovascular: Normal heart rate noted  Respiratory: Normal respiratory effort, no problems with respiration noted  Abdomen: Soft, gravid, appropriate for gestational age. Pain/Pressure: Absent     Pelvic: Cervical exam deferred        Extremities: Normal range of motion.     Mental Status: Normal mood and affect. Normal behavior. Normal judgment and thought content.   Urinalysis:      Assessment and Plan:  Pregnancy: G2P1001 at 4080w0d  1. Chronic hypertension during pregnancy, antepartum - Hypertension. Well controlled without medication currently. Patient denies severe features, chest pain and palpitations. Patient was instructed  to invest in activities that would calm her. Will continue to follow. -Continue to take low dose aspirin - Next US scheduled for 32 weeks - Continue routine prenatal care.  Preterm labor symptoms and general obstetric precautions including but not limited to vaginal bleeding, contractions, leaking of fluid and fetal movement were reviewed in detail with the patient. Please refer to After Visit Summary for other counseling recommendations.  Return in about 2 weeks (around 05/27/2016) for Routine OB.   Judeth HornErin Orena Cavazos, NP

## 2016-05-27 ENCOUNTER — Ambulatory Visit (INDEPENDENT_AMBULATORY_CARE_PROVIDER_SITE_OTHER): Payer: Medicaid Other | Admitting: Advanced Practice Midwife

## 2016-05-27 VITALS — BP 119/84 | HR 100 | Wt 226.0 lb

## 2016-05-27 DIAGNOSIS — R319 Hematuria, unspecified: Secondary | ICD-10-CM | POA: Diagnosis not present

## 2016-05-27 DIAGNOSIS — Z23 Encounter for immunization: Secondary | ICD-10-CM

## 2016-05-27 DIAGNOSIS — O10912 Unspecified pre-existing hypertension complicating pregnancy, second trimester: Secondary | ICD-10-CM

## 2016-05-27 DIAGNOSIS — O09893 Supervision of other high risk pregnancies, third trimester: Secondary | ICD-10-CM

## 2016-05-27 DIAGNOSIS — O10919 Unspecified pre-existing hypertension complicating pregnancy, unspecified trimester: Secondary | ICD-10-CM

## 2016-05-27 LAB — POCT URINALYSIS DIP (DEVICE)
BILIRUBIN URINE: NEGATIVE
GLUCOSE, UA: NEGATIVE mg/dL
KETONES UR: NEGATIVE mg/dL
Nitrite: NEGATIVE
PROTEIN: NEGATIVE mg/dL
SPECIFIC GRAVITY, URINE: 1.015 (ref 1.005–1.030)
Urobilinogen, UA: 0.2 mg/dL (ref 0.0–1.0)
pH: 7 (ref 5.0–8.0)

## 2016-05-27 MED ORDER — TETANUS-DIPHTH-ACELL PERTUSSIS 5-2.5-18.5 LF-MCG/0.5 IM SUSP
0.5000 mL | Freq: Once | INTRAMUSCULAR | Status: AC
  Administered 2016-05-27: 0.5 mL via INTRAMUSCULAR

## 2016-05-27 NOTE — Progress Notes (Signed)
Subjective:  Shirley Terrell is a 30 y.o. G2P1001 at 6054w0d being seen today for ongoing prenatal care.  She is currently monitored for the following issues for this high-risk pregnancy and has Supervision of other high risk pregnancy, antepartum; Chronic hypertension during pregnancy, antepartum; and Rubella non-immune status, antepartum on her problem list.  Patient reports occasional contractions.  Contractions: Not present. Vag. Bleeding: None.  Movement: Present. Denies leaking of fluid.   The following portions of the patient's history were reviewed and updated as appropriate: allergies, current medications, past family history, past medical history, past social history, past surgical history and problem list. Problem list updated.  Objective:   Filed Vitals:   05/27/16 1101  BP: 119/84  Pulse: 100  Weight: 226 lb (102.513 kg)    Fetal Status: Fetal Heart Rate (bpm): 131 Fundal Height: 35 cm Movement: Present     General:  Alert, oriented and cooperative. Patient is in no acute distress.  Skin: Skin is warm and dry. No rash noted.   Cardiovascular: Normal heart rate noted  Respiratory: Normal respiratory effort, no problems with respiration noted  Abdomen: Soft, gravid, appropriate for gestational age. Pain/Pressure: Absent     Pelvic:  Cervical exam deferred        Extremities: Normal range of motion.  Edema: None  Mental Status: Normal mood and affect. Normal behavior. Normal judgment and thought content.   Urinalysis: Urine Protein: Negative Urine Glucose: Negative  Assessment and Plan:  Pregnancy: G2P1001 at 7854w0d  1. Chronic hypertension during pregnancy, antepartum - Start antenatal testing  2. Supervision of other high risk pregnancy, antepartum, third trimester - TDaP  3. Hematuria  - Culture, OB Urine  Preterm labor symptoms and general obstetric precautions including but not limited to vaginal bleeding, contractions, leaking of fluid and fetal movement were  reviewed in detail with the patient. Please refer to After Visit Summary for other counseling recommendations.  Return in about 1 week (around 06/03/2016) for Start twice weekly testing.   Dorathy KinsmanVirginia Michalene Debruler, CNM

## 2016-05-27 NOTE — Patient Instructions (Signed)
Braxton Hicks Contractions °Contractions of the uterus can occur throughout pregnancy. Contractions are not always a sign that you are in labor.  °WHAT ARE BRAXTON HICKS CONTRACTIONS?  °Contractions that occur before labor are called Braxton Hicks contractions, or false labor. Toward the end of pregnancy (32-34 weeks), these contractions can develop more often and may become more forceful. This is not true labor because these contractions do not result in opening (dilatation) and thinning of the cervix. They are sometimes difficult to tell apart from true labor because these contractions can be forceful and people have different pain tolerances. You should not feel embarrassed if you go to the hospital with false labor. Sometimes, the only way to tell if you are in true labor is for your health care provider to look for changes in the cervix. °If there are no prenatal problems or other health problems associated with the pregnancy, it is completely safe to be sent home with false labor and await the onset of true labor. °HOW CAN YOU TELL THE DIFFERENCE BETWEEN TRUE AND FALSE LABOR? °False Labor °· The contractions of false labor are usually shorter and not as hard as those of true labor.   °· The contractions are usually irregular.   °· The contractions are often felt in the front of the lower abdomen and in the groin.   °· The contractions may go away when you walk around or change positions while lying down.   °· The contractions get weaker and are shorter lasting as time goes on.   °· The contractions do not usually become progressively stronger, regular, and closer together as with true labor.   °True Labor °· Contractions in true labor last 30-70 seconds, become very regular, usually become more intense, and increase in frequency.   °· The contractions do not go away with walking.   °· The discomfort is usually felt in the top of the uterus and spreads to the lower abdomen and low back.   °· True labor can be  determined by your health care provider with an exam. This will show that the cervix is dilating and getting thinner.   °WHAT TO REMEMBER °· Keep up with your usual exercises and follow other instructions given by your health care provider.   °· Take medicines as directed by your health care provider.   °· Keep your regular prenatal appointments.   °· Eat and drink lightly if you think you are going into labor.   °· If Braxton Hicks contractions are making you uncomfortable:   °¨ Change your position from lying down or resting to walking, or from walking to resting.   °¨ Sit and rest in a tub of warm water.   °¨ Drink 2-3 glasses of water. Dehydration may cause these contractions.   °¨ Do slow and deep breathing several times an hour.   °WHEN SHOULD I SEEK IMMEDIATE MEDICAL CARE? °Seek immediate medical care if: °· Your contractions become stronger, more regular, and closer together.   °· You have fluid leaking or gushing from your vagina.   °· You have a fever.   °· You pass blood-tinged mucus.   °· You have vaginal bleeding.   °· You have continuous abdominal pain.   °· You have low back pain that you never had before.   °· You feel your baby's head pushing down and causing pelvic pressure.   °· Your baby is not moving as much as it used to.   °  °This information is not intended to replace advice given to you by your health care provider. Make sure you discuss any questions you have with your health care   provider. °  °Document Released: 11/03/2005 Document Revised: 11/08/2013 Document Reviewed: 08/15/2013 °Elsevier Interactive Patient Education ©2016 Elsevier Inc. ° °

## 2016-05-27 NOTE — Addendum Note (Signed)
Addended by: Garret ReddishBARNES, Kennedey Digilio M on: 05/27/2016 11:54 AM   Modules accepted: Orders

## 2016-05-28 ENCOUNTER — Encounter: Payer: Self-pay | Admitting: Clinical

## 2016-05-29 LAB — CULTURE, OB URINE

## 2016-06-03 ENCOUNTER — Ambulatory Visit: Payer: Medicaid Other | Admitting: Advanced Practice Midwife

## 2016-06-03 VITALS — BP 127/78 | HR 106

## 2016-06-03 DIAGNOSIS — O10919 Unspecified pre-existing hypertension complicating pregnancy, unspecified trimester: Secondary | ICD-10-CM

## 2016-06-03 NOTE — Progress Notes (Signed)
US for growth on 7/20 - BPP added

## 2016-06-03 NOTE — Progress Notes (Signed)
NST reactive.

## 2016-06-03 NOTE — Patient Instructions (Signed)

## 2016-06-05 ENCOUNTER — Other Ambulatory Visit (HOSPITAL_COMMUNITY): Payer: Self-pay | Admitting: Maternal and Fetal Medicine

## 2016-06-05 ENCOUNTER — Ambulatory Visit (HOSPITAL_COMMUNITY)
Admission: RE | Admit: 2016-06-05 | Discharge: 2016-06-05 | Disposition: A | Discharge status: Home / Self Care | Payer: Medicaid Other | Source: Ambulatory Visit | Attending: Family | Admitting: Family

## 2016-06-05 ENCOUNTER — Encounter (HOSPITAL_COMMUNITY): Payer: Self-pay

## 2016-06-05 DIAGNOSIS — O9989 Other specified diseases and conditions complicating pregnancy, childbirth and the puerperium: Secondary | ICD-10-CM | POA: Diagnosis not present

## 2016-06-05 DIAGNOSIS — O99213 Obesity complicating pregnancy, third trimester: Secondary | ICD-10-CM

## 2016-06-05 DIAGNOSIS — J45909 Unspecified asthma, uncomplicated: Secondary | ICD-10-CM | POA: Diagnosis not present

## 2016-06-05 DIAGNOSIS — O10919 Unspecified pre-existing hypertension complicating pregnancy, unspecified trimester: Secondary | ICD-10-CM

## 2016-06-05 DIAGNOSIS — Z3A32 32 weeks gestation of pregnancy: Secondary | ICD-10-CM

## 2016-06-05 DIAGNOSIS — O99519 Diseases of the respiratory system complicating pregnancy, unspecified trimester: Secondary | ICD-10-CM

## 2016-06-05 DIAGNOSIS — O10013 Pre-existing essential hypertension complicating pregnancy, third trimester: Secondary | ICD-10-CM | POA: Insufficient documentation

## 2016-06-10 ENCOUNTER — Ambulatory Visit (INDEPENDENT_AMBULATORY_CARE_PROVIDER_SITE_OTHER): Payer: Medicaid Other | Admitting: Medical

## 2016-06-10 VITALS — BP 129/77 | HR 113 | Wt 227.5 lb

## 2016-06-10 DIAGNOSIS — O09893 Supervision of other high risk pregnancies, third trimester: Secondary | ICD-10-CM

## 2016-06-10 DIAGNOSIS — O10919 Unspecified pre-existing hypertension complicating pregnancy, unspecified trimester: Secondary | ICD-10-CM

## 2016-06-10 DIAGNOSIS — O10912 Unspecified pre-existing hypertension complicating pregnancy, second trimester: Secondary | ICD-10-CM | POA: Diagnosis present

## 2016-06-10 LAB — POCT URINALYSIS DIP (DEVICE)
BILIRUBIN URINE: NEGATIVE
Glucose, UA: NEGATIVE mg/dL
KETONES UR: 15 mg/dL — AB
Nitrite: NEGATIVE
Protein, ur: 30 mg/dL — AB
SPECIFIC GRAVITY, URINE: 1.02 (ref 1.005–1.030)
Urobilinogen, UA: 2 mg/dL — ABNORMAL HIGH (ref 0.0–1.0)
pH: 7 (ref 5.0–8.0)

## 2016-06-10 NOTE — Progress Notes (Signed)
[place] for growth done 7/20

## 2016-06-10 NOTE — Progress Notes (Signed)
Subjective:  Shirley Terrell is a 30 y.o. G2P1001 at [redacted]w[redacted]d being seen today for ongoing prenatal care.  She is currently monitored for the following issues for this high-risk pregnancy and has Supervision of other high risk pregnancy, antepartum; Chronic hypertension during pregnancy, antepartum; and Rubella non-immune status, antepartum on her problem list.  Patient reports no complaints.  Contractions: Irregular. Vag. Bleeding: None.  Movement: Present. Denies leaking of fluid.   The following portions of the patient's history were reviewed and updated as appropriate: allergies, current medications, past family history, past medical history, past social history, past surgical history and problem list. Problem list updated.  Objective:   Vitals:   06/10/16 1057  BP: 129/77  Pulse: (!) 113  Weight: 227 lb 8 oz (103.2 kg)    Fetal Status: Fetal Heart Rate (bpm): NST   Movement: Present    Fundal height - 33 cm  General:  Alert, oriented and cooperative. Patient is in no acute distress.  Skin: Skin is warm and dry. No rash noted.   Cardiovascular: Normal heart rate noted  Respiratory: Normal respiratory effort, no problems with respiration noted  Abdomen: Soft, gravid, appropriate for gestational age. Pain/Pressure: Present     Pelvic:  Cervical exam deferred        Extremities: Normal range of motion.  Edema: None  Mental Status: Normal mood and affect. Normal behavior. Normal judgment and thought content.   Urinalysis:      Assessment and Plan:  Pregnancy: G2P1001 at [redacted]w[redacted]d  1. Chronic hypertension during pregnancy, antepartum - Fetal nonstress test - reactive with occasional contractions - Blood pressure today well-controlled, no concerning S/S of pre-eclampsia - Last [place] for growth - normal 7/20 - reviewed with patient - already has follow-up scheduled for 07/03/16 with MFM  Preterm labor symptoms and general obstetric precautions including but not limited to vaginal bleeding,  contractions, leaking of fluid and fetal movement were reviewed in detail with the patient. Please refer to After Visit Summary for other counseling recommendations.  Return in about 2 days (around 06/12/2016) for NST/AFI.   Marny Lowenstein, PA-C

## 2016-06-12 ENCOUNTER — Ambulatory Visit (INDEPENDENT_AMBULATORY_CARE_PROVIDER_SITE_OTHER): Payer: Medicaid Other | Admitting: Family Medicine

## 2016-06-12 DIAGNOSIS — Z36 Encounter for antenatal screening of mother: Secondary | ICD-10-CM | POA: Diagnosis not present

## 2016-06-12 DIAGNOSIS — O10919 Unspecified pre-existing hypertension complicating pregnancy, unspecified trimester: Secondary | ICD-10-CM

## 2016-06-12 DIAGNOSIS — O10912 Unspecified pre-existing hypertension complicating pregnancy, second trimester: Secondary | ICD-10-CM

## 2016-06-12 NOTE — Progress Notes (Signed)
NST reactive.

## 2016-06-17 ENCOUNTER — Ambulatory Visit (INDEPENDENT_AMBULATORY_CARE_PROVIDER_SITE_OTHER): Payer: Medicaid Other | Admitting: Family

## 2016-06-17 ENCOUNTER — Encounter: Payer: Self-pay | Admitting: Family

## 2016-06-17 VITALS — BP 124/79 | HR 104 | Wt 229.3 lb

## 2016-06-17 DIAGNOSIS — O10912 Unspecified pre-existing hypertension complicating pregnancy, second trimester: Secondary | ICD-10-CM

## 2016-06-17 DIAGNOSIS — O09893 Supervision of other high risk pregnancies, third trimester: Secondary | ICD-10-CM

## 2016-06-17 DIAGNOSIS — O10919 Unspecified pre-existing hypertension complicating pregnancy, unspecified trimester: Secondary | ICD-10-CM

## 2016-06-17 LAB — POCT URINALYSIS DIP (DEVICE)
Glucose, UA: NEGATIVE mg/dL
Ketones, ur: 15 mg/dL — AB
Nitrite: NEGATIVE
PROTEIN: 30 mg/dL — AB
SPECIFIC GRAVITY, URINE: 1.015 (ref 1.005–1.030)
UROBILINOGEN UA: 2 mg/dL — AB (ref 0.0–1.0)
pH: 7 (ref 5.0–8.0)

## 2016-06-17 NOTE — Progress Notes (Signed)
Subjective:  Shirley Terrell is a 30 y.o. G2P1001 at [redacted]w[redacted]d being seen today for ongoing prenatal care.  She is currently monitored for the following issues for this high-risk pregnancy and has Supervision of other high risk pregnancy, antepartum; Chronic hypertension during pregnancy, antepartum; and Rubella non-immune status, antepartum on her problem list.  Patient reports no complaints.  Contractions: Irregular. Vag. Bleeding: None.  Movement: Present. Denies leaking of fluid.   The following portions of the patient's history were reviewed and updated as appropriate: allergies, current medications, past family history, past medical history, past social history, past surgical history and problem list. Problem list updated.  Objective:   Vitals:   06/17/16 0950  BP: 124/79  Pulse: (!) 104  Weight: 229 lb 4.8 oz (104 kg)    Fetal Status: Fetal Heart Rate (bpm): NST-R Fundal Height: 35 cm Movement: Present     General:  Alert, oriented and cooperative. Patient is in no acute distress.  Skin: Skin is warm and dry. No rash noted.   Cardiovascular: Normal heart rate noted  Respiratory: Normal respiratory effort, no problems with respiration noted  Abdomen: Soft, gravid, appropriate for gestational age. Pain/Pressure: Present     Pelvic:  Cervical exam deferred        Extremities: Normal range of motion.  Edema: None  Mental Status: Normal mood and affect. Normal behavior. Normal judgment and thought content.   Urinalysis: Urine Protein: 1+ Urine Glucose: Negative  Assessment and Plan:  Pregnancy: G2P1001 at [redacted]w[redacted]d  1. Chronic hypertension during pregnancy, antepartum - Fetal nonstress test > reactive - Growth ultrasound scheduled for 06/23/16  2. Supervision of other high risk pregnancy, antepartum, third trimester - Continue monitoring  Preterm labor symptoms and general obstetric precautions including but not limited to vaginal bleeding, contractions, leaking of fluid and fetal  movement were reviewed in detail with the patient. Please refer to After Visit Summary for other counseling recommendations.  Keep appointments as previously entered by Diane Day  Marlis Edelson, CNM

## 2016-06-18 ENCOUNTER — Encounter (HOSPITAL_COMMUNITY): Payer: Self-pay

## 2016-06-18 ENCOUNTER — Inpatient Hospital Stay (HOSPITAL_COMMUNITY)
Admission: AD | Admit: 2016-06-18 | Discharge: 2016-06-18 | Disposition: A | Discharge status: Home / Self Care | Payer: Medicaid Other | Source: Ambulatory Visit | Attending: Obstetrics and Gynecology | Admitting: Obstetrics and Gynecology

## 2016-06-18 DIAGNOSIS — O10013 Pre-existing essential hypertension complicating pregnancy, third trimester: Secondary | ICD-10-CM | POA: Insufficient documentation

## 2016-06-18 DIAGNOSIS — Z7982 Long term (current) use of aspirin: Secondary | ICD-10-CM | POA: Insufficient documentation

## 2016-06-18 DIAGNOSIS — Z3A34 34 weeks gestation of pregnancy: Secondary | ICD-10-CM | POA: Insufficient documentation

## 2016-06-18 DIAGNOSIS — O26893 Other specified pregnancy related conditions, third trimester: Secondary | ICD-10-CM | POA: Insufficient documentation

## 2016-06-18 DIAGNOSIS — R1031 Right lower quadrant pain: Secondary | ICD-10-CM | POA: Diagnosis present

## 2016-06-18 DIAGNOSIS — O99513 Diseases of the respiratory system complicating pregnancy, third trimester: Secondary | ICD-10-CM | POA: Diagnosis not present

## 2016-06-18 DIAGNOSIS — J45909 Unspecified asthma, uncomplicated: Secondary | ICD-10-CM | POA: Diagnosis not present

## 2016-06-18 DIAGNOSIS — Z87891 Personal history of nicotine dependence: Secondary | ICD-10-CM | POA: Diagnosis not present

## 2016-06-18 DIAGNOSIS — O9989 Other specified diseases and conditions complicating pregnancy, childbirth and the puerperium: Secondary | ICD-10-CM | POA: Diagnosis not present

## 2016-06-18 DIAGNOSIS — S39011A Strain of muscle, fascia and tendon of abdomen, initial encounter: Secondary | ICD-10-CM | POA: Diagnosis not present

## 2016-06-18 DIAGNOSIS — R109 Unspecified abdominal pain: Secondary | ICD-10-CM | POA: Diagnosis not present

## 2016-06-18 LAB — URINALYSIS, ROUTINE W REFLEX MICROSCOPIC
BILIRUBIN URINE: NEGATIVE
Glucose, UA: NEGATIVE mg/dL
KETONES UR: NEGATIVE mg/dL
NITRITE: NEGATIVE
PH: 6.5 (ref 5.0–8.0)
PROTEIN: NEGATIVE mg/dL
Specific Gravity, Urine: 1.02 (ref 1.005–1.030)

## 2016-06-18 LAB — URINE MICROSCOPIC-ADD ON

## 2016-06-18 MED ORDER — ACETAMINOPHEN 500 MG PO TABS
1000.0000 mg | ORAL_TABLET | Freq: Four times a day (QID) | ORAL | Status: DC | PRN
  Administered 2016-06-18: 1000 mg via ORAL
  Filled 2016-06-18: qty 2

## 2016-06-18 MED ORDER — ACETAMINOPHEN 500 MG PO TABS: 1000.0000 mg | ORAL_TABLET | Freq: Four times a day (QID) | ORAL | 0 refills | Status: DC | PRN

## 2016-06-18 MED ORDER — CYCLOBENZAPRINE HCL 10 MG PO TABS
10.0000 mg | ORAL_TABLET | Freq: Once | ORAL | Status: AC
Start: 2016-06-18 — End: 2016-06-18
  Administered 2016-06-18: 10 mg via ORAL
  Filled 2016-06-18: qty 1

## 2016-06-18 MED ORDER — CYCLOBENZAPRINE HCL 10 MG PO TABS: 10.0000 mg | ORAL_TABLET | Freq: Three times a day (TID) | ORAL | 2 refills | Status: DC | PRN

## 2016-06-18 NOTE — MAU Provider Note (Signed)
History     CSN: [id number]  Arrival date and time: 06/18/16 2017   None     Chief Complaint  Patient presents with  . Muscle Pain   HPI 30 year old female at 34 weeks and 1 day presents for pain on the right lower quadrant near her inguinal region and pubic symphysis. Patient was the pulled today when she was getting out her nephew was trying to play with her and she strained a muscle.  The pain was sharp and has continued since. Nothing she does help alleviate the pain. Movement makes pain worse. She feels a lot of pelvic pressure. Patient denies contractions loss of fluid or vaginal bleeding. Patient has good fetal movement.   Past Medical History:  Diagnosis Date  . Anemia   . Asthma    no problems since 2009  . Bacterial vaginosis   . Gallstones   . Hypertension   . Trichomonas     Past Surgical History:  Procedure Laterality Date  . CHOLECYSTECTOMY  04/13/2012   Procedure: LAPAROSCOPIC CHOLECYSTECTOMY WITH INTRAOPERATIVE CHOLANGIOGRAM;  Surgeon: Adolph Pollack, MD;  Location: [facility] OR;  Service: General;  Laterality: N/A;  laparoscopic cholecystectomy with IOC    Family History  Problem Relation Age of Onset  . Hypertension Mother   . Stroke Mother   . Heart disease Father   . Depression Father   . Cancer Paternal Grandmother     breast  . Diabetes Paternal Grandmother   . Cancer Paternal Grandfather   . Hypertension Maternal Grandmother   . Diabetes Other     Social History  Substance Use Topics  . Smoking status: Former Smoker    Packs/day: 0.25    Types: Cigarettes    Quit date: 11/19/2015  . Smokeless tobacco: Never Used  . Alcohol use No     Comment: socially    Allergies: No Known Allergies  Prescriptions Prior to Admission  Medication Sig Dispense Refill Last Dose  . albuterol (PROVENTIL HFA;VENTOLIN HFA) 108 (90 BASE) MCG/ACT inhaler Inhale 2 puffs into the lungs every 6 (six) hours as needed for wheezing. Reported on 04/11/2016   Past Month at  Unknown time  . aspirin EC 81 MG tablet Take 1 tablet (81 mg total) by mouth daily. Take after 12 weeks for prevention of preeclampssia later in pregnancy 300 tablet 2 06/18/2016 at Unknown time  . ferrous sulfate 325 (65 FE) MG tablet Take 325 mg by mouth daily with breakfast.   06/18/2016 at Unknown time  . Prenat w/o A Vit-FeFum-FePo-FA (CONCEPT OB) 130-92.4-1 MG CAPS Take 1 tablet by mouth daily. 30 capsule 12 06/18/2016 at Unknown time    Review of Systems  Constitutional: Negative.   Respiratory: Negative.   Cardiovascular: Negative.   Genitourinary: Negative.   Musculoskeletal:       Muscle tenderness in abdominal wall and right lower quadrant  Skin: Negative.   Psychiatric/Behavioral: Negative.    Physical Exam   Blood pressure 157/97, pulse (!) 126, temperature 98.5 F (36.9 C), temperature source Oral, resp. rate 16, height 5\' 3"  (1.6 m), weight 229 lb (103.9 kg), last menstrual period 10/15/2015, SpO2 98 %.  Physical Exam  Vitals reviewed. Constitutional: She is oriented to person, place, and time. She appears well-developed and well-nourished. No distress.  HENT:  Head: Normocephalic and atraumatic.  Eyes: Conjunctivae are normal.  Neck: Neck supple. No thyromegaly present.  Cardiovascular: Normal rate and regular rhythm.   Respiratory: Effort normal and breath sounds normal.  GI: Soft.  She exhibits no mass. There is tenderness. There is no rebound and no guarding.    Musculoskeletal: Normal range of motion. She exhibits no tenderness.  Neurological: She is alert and oriented to person, place, and time.  Skin: Skin is warm and dry.  Psychiatric: She has a normal mood and affect.    MAU Course  Procedures  MDM   Assessment and Plan  30 year old female G2 para 1001 at 34 weeks and 1 day with abdominal wall muscle strain.  Patient has no signs of preterm labor. Fetal heart tracing is reactive with a baseline of 140.  1-Flexeril when necessary 2-Tylenol when  necessary 3-heating pad 20 minutes an hour to affected area 4-return if pain worsens  Adelard Sanon H. 06/18/2016, 9:23 PM

## 2016-06-18 NOTE — MAU Note (Signed)
Pt states that she was swimming earlier-felt like she pulled a muscle. Went to store and began having a lot of pressure

## 2016-06-18 NOTE — Discharge Instructions (Signed)
Muscle Strain °A muscle strain (pulled muscle) happens when a muscle is stretched beyond normal length. It happens when a sudden, violent force stretches your muscle too far. Usually, a few of the fibers in your muscle are torn. Muscle strain is common in athletes. Recovery usually takes 1-2 weeks. Complete healing takes 5-6 weeks.  °HOME CARE  °· Follow the PRICE method of treatment to help your injury get better. Do this the first 2-3 days after the injury: °¨ Protect. Protect the muscle to keep it from getting injured again. °¨ Rest. Limit your activity and rest the injured body part. °¨ Ice. Put ice in a plastic bag. Place a towel between your skin and the bag. Then, apply the ice and leave it on from 15-20 minutes each hour. After the third day, switch to moist heat packs. °¨ Compression. Use a splint or elastic bandage on the injured area for comfort. Do not put it on too tightly. °¨ Elevate. Keep the injured body part above the level of your heart. °· Only take medicine as told by your doctor. °· Warm up before doing exercise to prevent future muscle strains. °GET HELP IF:  °· You have more pain or puffiness (swelling) in the injured area. °· You feel numbness, tingling, or notice a loss of strength in the injured area. °MAKE SURE YOU:  °· Understand these instructions. °· Will watch your condition. °· Will get help right away if you are not doing well or get worse. °  °This information is not intended to replace advice given to you by your health care provider. Make sure you discuss any questions you have with your health care provider. °  °Document Released: 08/12/2008 Document Revised: 08/24/2013 Document Reviewed: 06/02/2013 °Elsevier Interactive Patient Education ©2016 [employer]. ° °

## 2016-06-19 ENCOUNTER — Ambulatory Visit (INDEPENDENT_AMBULATORY_CARE_PROVIDER_SITE_OTHER): Payer: Medicaid Other | Admitting: *Deleted

## 2016-06-19 VITALS — BP 129/85 | HR 109

## 2016-06-19 DIAGNOSIS — O10912 Unspecified pre-existing hypertension complicating pregnancy, second trimester: Secondary | ICD-10-CM

## 2016-06-19 DIAGNOSIS — O10919 Unspecified pre-existing hypertension complicating pregnancy, unspecified trimester: Secondary | ICD-10-CM

## 2016-06-19 NOTE — Progress Notes (Signed)
AFI normal

## 2016-06-19 NOTE — Progress Notes (Signed)
Pt was seen @ MAU last evening until 2120 and had reactive FHR tracing. Per consult w/Dr. Adrian Blackwater, pt does not need NST today as scheduled. AFI completed.

## 2016-06-24 ENCOUNTER — Ambulatory Visit (INDEPENDENT_AMBULATORY_CARE_PROVIDER_SITE_OTHER): Payer: Medicaid Other | Admitting: Family

## 2016-06-24 VITALS — BP 126/84 | HR 106

## 2016-06-24 DIAGNOSIS — O10912 Unspecified pre-existing hypertension complicating pregnancy, second trimester: Secondary | ICD-10-CM

## 2016-06-24 DIAGNOSIS — O10919 Unspecified pre-existing hypertension complicating pregnancy, unspecified trimester: Secondary | ICD-10-CM

## 2016-06-25 NOTE — Progress Notes (Signed)
NST reviewed and found to be reactive.

## 2016-06-26 ENCOUNTER — Ambulatory Visit (INDEPENDENT_AMBULATORY_CARE_PROVIDER_SITE_OTHER): Payer: Medicaid Other | Admitting: *Deleted

## 2016-06-26 VITALS — BP 120/84 | HR 114

## 2016-06-26 DIAGNOSIS — O10912 Unspecified pre-existing hypertension complicating pregnancy, second trimester: Secondary | ICD-10-CM

## 2016-06-26 DIAGNOSIS — O10919 Unspecified pre-existing hypertension complicating pregnancy, unspecified trimester: Secondary | ICD-10-CM

## 2016-06-26 DIAGNOSIS — Z36 Encounter for antenatal screening of mother: Secondary | ICD-10-CM

## 2016-06-26 NOTE — Progress Notes (Signed)
NST performed today was reviewed and was found to be reactive.  AFI was also normal.  Continue recommended antenatal testing and prenatal care. 

## 2016-07-01 ENCOUNTER — Other Ambulatory Visit (HOSPITAL_COMMUNITY)
Admission: RE | Admit: 2016-07-01 | Discharge: 2016-07-01 | Disposition: A | Discharge status: Home / Self Care | Payer: Medicaid Other | Source: Ambulatory Visit | Attending: Family | Admitting: Family

## 2016-07-01 ENCOUNTER — Ambulatory Visit (INDEPENDENT_AMBULATORY_CARE_PROVIDER_SITE_OTHER): Payer: Medicaid Other | Admitting: Obstetrics and Gynecology

## 2016-07-01 VITALS — BP 123/78 | HR 120 | Wt 231.6 lb

## 2016-07-01 DIAGNOSIS — O10912 Unspecified pre-existing hypertension complicating pregnancy, second trimester: Secondary | ICD-10-CM

## 2016-07-01 DIAGNOSIS — O10919 Unspecified pre-existing hypertension complicating pregnancy, unspecified trimester: Secondary | ICD-10-CM

## 2016-07-01 DIAGNOSIS — Z113 Encounter for screening for infections with a predominantly sexual mode of transmission: Secondary | ICD-10-CM

## 2016-07-01 LAB — POCT URINALYSIS DIP (DEVICE)
Bilirubin Urine: NEGATIVE
GLUCOSE, UA: NEGATIVE mg/dL
Ketones, ur: NEGATIVE mg/dL
NITRITE: NEGATIVE
PROTEIN: NEGATIVE mg/dL
Specific Gravity, Urine: <=1.005 (ref 1.005–1.030)
UROBILINOGEN UA: 0.2 mg/dL (ref 0.0–1.0)
pH: 6 (ref 5.0–8.0)

## 2016-07-01 NOTE — Progress Notes (Signed)
Subjective:  Shirley Terrell is a 30 y.o. G2P1001 at 265w0d being seen today for ongoing prenatal care.  She is currently monitored for the following issues for this high-risk pregnancy and has Supervision of other high risk pregnancy, antepartum; Chronic hypertension during pregnancy, antepartum; and Rubella non-immune status, antepartum on her problem list.  Patient reports no complaints.  Contractions: Irregular. Vag. Bleeding: None.  Movement: Present. Denies leaking of fluid.   The following portions of the patient's history were reviewed and updated as appropriate: allergies, current medications, past family history, past medical history, past social history, past surgical history and problem list. Problem list updated.  Objective:   Vitals:   07/01/16 1002  BP: 123/78  Pulse: (!) 120  Weight: 231 lb 9.6 oz (105.1 kg)    Fetal Status: Fetal Heart Rate (bpm): NST   Movement: Present     General:  Alert, oriented and cooperative. Patient is in no acute distress.  Skin: Skin is warm and dry. No rash noted.   Cardiovascular: Normal heart rate noted  Respiratory: Normal respiratory effort, no problems with respiration noted  Abdomen: Soft, gravid, appropriate for gestational age. Pain/Pressure: Present     Pelvic: 1/thick/high. vertex  Extremities: Normal range of motion.  Edema: Mild pitting, slight indentation  Mental Status: Normal mood and affect. Normal behavior. Normal judgment and thought content.   Urinalysis:      Assessment and Plan:  Pregnancy: G2P1001 at 245w0d  1. Chronic hypertension during pregnancy, antepartum - bp wnl off meds - Culture, beta strep (group b only) - GC/Chlamydia probe amp ([place] - Fetal nonstress test reactive, continue 2x weekly  Preterm labor symptoms and general obstetric precautions including but not limited to vaginal bleeding, contractions, leaking of fluid and fetal movement were reviewed in detail with the  patient. Please refer to After Visit Summary for other counseling recommendations.  Return for needs 2 x week nst next week, has BPP scheduled  07/03/16.   Kathrynn RunningNoah Bedford Fergus Throne, MD

## 2016-07-02 LAB — GC/CHLAMYDIA PROBE AMP (~~LOC~~) NOT AT ARMC
CHLAMYDIA, DNA PROBE: NEGATIVE
Neisseria Gonorrhea: NEGATIVE

## 2016-07-03 ENCOUNTER — Other Ambulatory Visit: Payer: Self-pay | Admitting: Advanced Practice Midwife

## 2016-07-03 ENCOUNTER — Ambulatory Visit (HOSPITAL_COMMUNITY)
Admission: RE | Admit: 2016-07-03 | Discharge: 2016-07-03 | Disposition: A | Discharge status: Home / Self Care | Payer: Medicaid Other | Source: Ambulatory Visit | Attending: Family | Admitting: Family

## 2016-07-03 ENCOUNTER — Other Ambulatory Visit (HOSPITAL_COMMUNITY): Payer: Self-pay | Admitting: Maternal and Fetal Medicine

## 2016-07-03 ENCOUNTER — Encounter (HOSPITAL_COMMUNITY): Payer: Self-pay

## 2016-07-03 DIAGNOSIS — Z3A36 36 weeks gestation of pregnancy: Secondary | ICD-10-CM

## 2016-07-03 DIAGNOSIS — E669 Obesity, unspecified: Secondary | ICD-10-CM | POA: Diagnosis not present

## 2016-07-03 DIAGNOSIS — O10919 Unspecified pre-existing hypertension complicating pregnancy, unspecified trimester: Secondary | ICD-10-CM

## 2016-07-03 DIAGNOSIS — O99213 Obesity complicating pregnancy, third trimester: Secondary | ICD-10-CM

## 2016-07-03 DIAGNOSIS — O10912 Unspecified pre-existing hypertension complicating pregnancy, second trimester: Secondary | ICD-10-CM | POA: Diagnosis not present

## 2016-07-04 LAB — CULTURE, BETA STREP (GROUP B ONLY)

## 2016-07-08 ENCOUNTER — Encounter: Payer: Self-pay | Admitting: Obstetrics and Gynecology

## 2016-07-08 DIAGNOSIS — B951 Streptococcus, group B, as the cause of diseases classified elsewhere: Secondary | ICD-10-CM | POA: Insufficient documentation

## 2016-07-09 ENCOUNTER — Ambulatory Visit (INDEPENDENT_AMBULATORY_CARE_PROVIDER_SITE_OTHER): Payer: Medicaid Other | Admitting: Obstetrics and Gynecology

## 2016-07-09 VITALS — BP 127/75 | HR 113 | Wt 228.2 lb

## 2016-07-09 DIAGNOSIS — O26893 Other specified pregnancy related conditions, third trimester: Secondary | ICD-10-CM

## 2016-07-09 DIAGNOSIS — O10913 Unspecified pre-existing hypertension complicating pregnancy, third trimester: Secondary | ICD-10-CM | POA: Diagnosis not present

## 2016-07-09 DIAGNOSIS — Z36 Encounter for antenatal screening of mother: Secondary | ICD-10-CM | POA: Diagnosis not present

## 2016-07-09 DIAGNOSIS — O09893 Supervision of other high risk pregnancies, third trimester: Secondary | ICD-10-CM

## 2016-07-09 DIAGNOSIS — O10919 Unspecified pre-existing hypertension complicating pregnancy, unspecified trimester: Secondary | ICD-10-CM

## 2016-07-09 DIAGNOSIS — R300 Dysuria: Secondary | ICD-10-CM

## 2016-07-09 DIAGNOSIS — B951 Streptococcus, group B, as the cause of diseases classified elsewhere: Secondary | ICD-10-CM

## 2016-07-09 LAB — POCT URINALYSIS DIP (DEVICE)
GLUCOSE, UA: NEGATIVE mg/dL
KETONES UR: NEGATIVE mg/dL
Nitrite: NEGATIVE
PROTEIN: 30 mg/dL — AB
Urobilinogen, UA: 2 mg/dL — ABNORMAL HIGH (ref 0.0–1.0)
pH: 6 (ref 5.0–8.0)

## 2016-07-09 LAB — OB RESULTS CONSOLE GBS: GBS: POSITIVE

## 2016-07-09 NOTE — Progress Notes (Signed)
Pt reports slight decreased FM since yesterday - feels good FM during NST.  US for growth and BPP done 8/17

## 2016-07-09 NOTE — Patient Instructions (Addendum)
Hypertension During Pregnancy °Hypertension, or high blood pressure, is when there is extra pressure inside your blood vessels that carry blood from the heart to the rest of your body (arteries). It can happen at any time in life, including pregnancy. Hypertension during pregnancy can cause problems for you and your baby. Your baby might not weigh as much as he or she should at birth or might be born early (premature). Very bad cases of hypertension during pregnancy can be life-threatening.  °Different types of hypertension can occur during pregnancy. These include: °· Chronic hypertension. This happens when a woman has hypertension before pregnancy and it continues during pregnancy. °· Gestational hypertension. This is when hypertension develops during pregnancy. °· Preeclampsia or toxemia of pregnancy. This is a very serious type of hypertension that develops only during pregnancy. It affects the whole body and can be very dangerous for both mother and baby.   °Gestational hypertension and preeclampsia usually go away after your baby is born. Your blood pressure will likely stabilize within 6 weeks. Women who have hypertension during pregnancy have a greater chance of developing hypertension later in life or with future pregnancies. °RISK FACTORS °There are certain factors that make it more likely for you to develop hypertension during pregnancy. These include: °· Having hypertension before pregnancy. °· Having hypertension during a previous pregnancy. °· Being overweight. °· Being older than 40 years. °· Being pregnant with more than one baby. °· Having diabetes or kidney problems. °SIGNS AND SYMPTOMS °Chronic and gestational hypertension rarely cause symptoms. Preeclampsia has symptoms, which may include: °· Increased protein in your urine. Your health care provider will check for this at every prenatal visit. °· Swelling of your hands and face. °· Rapid weight gain. °· Headaches. °· Visual changes. °· Being  bothered by light. °· Abdominal pain, especially in the upper right area. °· Chest pain. °· Shortness of breath. °· Increased reflexes. °· Seizures. These occur with a more severe form of preeclampsia, called eclampsia. °DIAGNOSIS  °You may be diagnosed with hypertension during a regular prenatal exam. At each prenatal visit, you may have: °· Your blood pressure checked. °· A urine test to check for protein in your urine. °The type of hypertension you are diagnosed with depends on when you developed it. It also depends on your specific blood pressure reading. °· Developing hypertension before 20 weeks of pregnancy is consistent with chronic hypertension. °· Developing hypertension after 20 weeks of pregnancy is consistent with gestational hypertension. °· Hypertension with increased urinary protein is diagnosed as preeclampsia. °· Blood pressure measurements that stay above 160 systolic or 110 diastolic are a sign of severe preeclampsia. °TREATMENT °Treatment for hypertension during pregnancy varies. Treatment depends on the type of hypertension and how serious it is. °· If you take medicine for chronic hypertension, you may need to switch medicines. °¨ Medicines called ACE inhibitors should not be taken during pregnancy. °¨ Low-dose aspirin may be suggested for women who have risk factors for preeclampsia. °· If you have gestational hypertension, you may need to take a blood pressure medicine that is safe during pregnancy. Your health care provider will recommend the correct medicine. °· If you have severe preeclampsia, you may need to be in the hospital. Health care providers will watch you and your baby very closely. You also may need to take medicine called magnesium sulfate to prevent seizures and lower blood pressure. °· Sometimes, an early delivery is needed. This may be the case if the condition worsens. It would be   done to protect you and your baby. The only cure for preeclampsia is delivery. °· Your health  care provider may recommend that you take one low-dose aspirin (81 mg) each day to help prevent high blood pressure during your pregnancy if you are at risk for preeclampsia. You may be at risk for preeclampsia if: °¨ You had preeclampsia or eclampsia during a previous pregnancy. °¨ Your baby did not grow as expected during a previous pregnancy. °¨ You experienced preterm birth with a previous pregnancy. °¨ You experienced a separation of the placenta from the uterus (placental abruption) during a previous pregnancy. °¨ You experienced the loss of your baby during a previous pregnancy. °¨ You are pregnant with more than one baby. °¨ You have other medical conditions, such as diabetes or an autoimmune disease. °HOME CARE INSTRUCTIONS °· Schedule and keep all of your regular prenatal care appointments. This is important. °· Take medicines only as directed by your health care provider. Tell your health care provider about all medicines you take. °· Eat as little salt as possible. °· Get regular exercise. °· Do not drink alcohol. °· Do not use tobacco products. °· Do not drink products with caffeine. °· Lie on your left side when resting. °SEEK IMMEDIATE MEDICAL CARE IF: °· You have severe abdominal pain. °· You have sudden swelling in your hands, ankles, or face. °· You gain 4 pounds (1.8 kg) or more in 1 week. °· You vomit repeatedly. °· You have vaginal bleeding. °· You do not feel your baby moving as much. °· You have a headache. °· You have blurred or double vision. °· You have muscle twitching or spasms. °· You have shortness of breath. °· You have blue fingernails or lips. °· You have blood in your urine. °MAKE SURE YOU: °· Understand these instructions. °· Will watch your condition. °· Will get help right away if you are not doing well or get worse. °  °This information is not intended to replace advice given to you by your health care provider. Make sure you discuss any questions you have with your health care  provider. °  °Document Released: 07/22/2011 Document Revised: 11/24/2014 Document Reviewed: 06/02/2013 °Elsevier Interactive Patient Education ©2016 [employer] Inc[employer] °Fetal Movement Counts °Patient Name: __________________________________________________ Patient Due Date: ____________________ °Performing a fetal movement count is highly recommended in high-risk pregnancies, but it is good for every pregnant woman to do. Your health care provider may ask you to start counting fetal movements at 28 weeks of the pregnancy. Fetal movements often increase: °· After eating a full meal. °· After physical activity. °· After eating or drinking something sweet or cold. °· At rest. °Pay attention to when you feel the baby is most active. This will help you notice a pattern of your baby's sleep and wake cycles and what factors contribute to an increase in fetal movement. It is important to perform a fetal movement count at the same time each day when your baby is normally most active.  °HOW TO COUNT FETAL MOVEMENTS °· Find a quiet and comfortable area to sit or lie down on your left side. Lying on your left side provides the best blood and oxygen circulation to your baby. °· Write down the day and time on a sheet of paper or in a journal. °· Start counting kicks, flutters, swishes, rolls, or jabs in a 2-hour period. You should feel at least 10 movements within 2 hours. °· If you do not feel 10 movements in 2   hours, wait 2-3 hours and count again. Look for a change in the pattern or not enough counts in 2 hours. °SEEK MEDICAL CARE IF: °· You feel less than 10 counts in 2 hours, tried twice. °· There is no movement in over an hour. °· The pattern is changing or taking longer each day to reach 10 counts in 2 hours. °· You feel the baby is not moving as he or she usually does. °Date: ____________ Movements: ____________ Start time: ____________ Finish time: ____________  °Date: ____________ Movements: ____________ Start time:  ____________ Finish time: ____________ °Date: ____________ Movements: ____________ Start time: ____________ Finish time: ____________ °Date: ____________ Movements: ____________ Start time: ____________ Finish time: ____________ °Date: ____________ Movements: ____________ Start time: ____________ Finish time: ____________ °Date: ____________ Movements: ____________ Start time: ____________ Finish time: ____________ °Date: ____________ Movements: ____________ Start time: ____________ Finish time: ____________ °Date: ____________ Movements: ____________ Start time: ____________ Finish time: ____________  °Date: ____________ Movements: ____________ Start time: ____________ Finish time: ____________ °Date: ____________ Movements: ____________ Start time: ____________ Finish time: ____________ °Date: ____________ Movements: ____________ Start time: ____________ Finish time: ____________ °Date: ____________ Movements: ____________ Start time: ____________ Finish time: ____________ °Date: ____________ Movements: ____________ Start time: ____________ Finish time: ____________ °Date: ____________ Movements: ____________ Start time: ____________ Finish time: ____________ °Date: ____________ Movements: ____________ Start time: ____________ Finish time: ____________  °Date: ____________ Movements: ____________ Start time: ____________ Finish time: ____________ °Date: ____________ Movements: ____________ Start time: ____________ Finish time: ____________ °Date: ____________ Movements: ____________ Start time: ____________ Finish time: ____________ °Date: ____________ Movements: ____________ Start time: ____________ Finish time: ____________ °Date: ____________ Movements: ____________ Start time: ____________ Finish time: ____________ °Date: ____________ Movements: ____________ Start time: ____________ Finish time: ____________ °Date: ____________ Movements: ____________ Start time: ____________ Finish time: ____________  °Date:  ____________ Movements: ____________ Start time: ____________ Finish time: ____________ °Date: ____________ Movements: ____________ Start time: ____________ Finish time: ____________ °Date: ____________ Movements: ____________ Start time: ____________ Finish time: ____________ °Date: ____________ Movements: ____________ Start time: ____________ Finish time: ____________ °Date: ____________ Movements: ____________ Start time: ____________ Finish time: ____________ °Date: ____________ Movements: ____________ Start time: ____________ Finish time: ____________ °Date: ____________ Movements: ____________ Start time: ____________ Finish time: ____________  °Date: ____________ Movements: ____________ Start time: ____________ Finish time: ____________ °Date: ____________ Movements: ____________ Start time: ____________ Finish time: ____________ °Date: ____________ Movements: ____________ Start time: ____________ Finish time: ____________ °Date: ____________ Movements: ____________ Start time: ____________ Finish time: ____________ °Date: ____________ Movements: ____________ Start time: ____________ Finish time: ____________ °Date: ____________ Movements: ____________ Start time: ____________ Finish time: ____________ °Date: ____________ Movements: ____________ Start time: ____________ Finish time: ____________  °Date: ____________ Movements: ____________ Start time: ____________ Finish time: ____________ °Date: ____________ Movements: ____________ Start time: ____________ Finish time: ____________ °Date: ____________ Movements: ____________ Start time: ____________ Finish time: ____________ °Date: ____________ Movements: ____________ Start time: ____________ Finish time: ____________ °Date: ____________ Movements: ____________ Start time: ____________ Finish time: ____________ °Date: ____________ Movements: ____________ Start time: ____________ Finish time: ____________ °Date: ____________ Movements: ____________ Start  time: ____________ Finish time: ____________  °Date: ____________ Movements: ____________ Start time: ____________ Finish time: ____________ °Date: ____________ Movements: ____________ Start time: ____________ Finish time: ____________ °Date: ____________ Movements: ____________ Start time: ____________ Finish time: ____________ °Date: ____________ Movements: ____________ Start time: ____________ Finish time: ____________ °Date: ____________ Movements: ____________ Start time: ____________ Finish time: ____________ °Date: ____________ Movements: ____________ Start time: ____________ Finish time: ____________ °Date: ____________ Movements: ____________ Start time: ____________ Finish time: ____________  °Date: ____________ Movements: ____________ Start time: ____________ Finish time: ____________ °Date: ____________   Movements: ____________ Start time: ____________ Finish time: ____________ °Date: ____________ Movements: ____________ Start time: ____________ Finish time: ____________ °Date: ____________ Movements: ____________ Start time: ____________ Finish time: ____________ °Date: ____________ Movements: ____________ Start time: ____________ Finish time: ____________ °Date: ____________ Movements: ____________ Start time: ____________ Finish time: ____________ °  °This information is not intended to replace advice given to you by your health care provider. Make sure you discuss any questions you have with your health care provider. °  °Document Released: 12/03/2006 Document Revised: 11/24/2014 Document Reviewed: 08/30/2012 °Elsevier Interactive Patient Education ©2016 [employer] ° °

## 2016-07-09 NOTE — Progress Notes (Signed)
Subjective:  Shirley Terrell is a 30 y.o. G2P1001 at 703w1d being seen today for ongoing prenatal care.  She is currently monitored for the following issues for this high-risk pregnancy and has Supervision of other high risk pregnancy, antepartum; Chronic hypertension during pregnancy, antepartum; Rubella non-immune status, antepartum; and Positive GBS test on her problem list.  Patient reports no complaints.  Contractions: Irregular. Vag. Bleeding: None.  Movement: (!) Decreased. Denies leaking of fluid.   The following portions of the patient's history were reviewed and updated as appropriate: allergies, current medications, past family history, past medical history, past social history, past surgical history and problem list. Problem list updated.  Objective:   Vitals:   07/09/16 1351  BP: 127/75  Pulse: (!) 113  Weight: 228 lb 3.2 oz (103.5 kg)    Fetal Status: Fetal Heart Rate (bpm): NST   Movement: (!) Decreased  Presentation: Vertex  General:  Alert, oriented and cooperative. Patient is in no acute distress.  Skin: Skin is warm and dry. No rash noted.   Cardiovascular: Normal heart rate noted  Respiratory: Normal respiratory effort, no problems with respiration noted  Abdomen: Soft, gravid, appropriate for gestational age. Pain/Pressure: Present     Pelvic:  Cervical exam deferred        Extremities: Normal range of motion.  Edema: Trace  Mental Status: Normal mood and affect. Normal behavior. Normal judgment and thought content.   Urinalysis: Urine Protein: 1+ Urine Glucose: Negative  Assessment and Plan:  Pregnancy: G2P1001 at 643w1d  1. Supervision of other high risk pregnancy, antepartum, third trimester  2. Chronic hypertension in pregnancy, third trimester - Amniotic fluid index with NST reactive and normal AFI today  3. Chronic hypertension during pregnancy, antepartum  4. Positive GBS test  5. Dysuria  Will check UTI  Term labor symptoms and general obstetric  precautions including but not limited to vaginal bleeding, contractions, leaking of fluid and fetal movement were reviewed in detail with the patient. Please refer to After Visit Summary for other counseling recommendations.  Return in about 1 week (around 07/16/2016) for Ob visit and NST/AFI, OB visit.   Hermina StaggersMichael L Sidni Fusco, MD

## 2016-07-10 LAB — CULTURE, OB URINE

## 2016-07-15 ENCOUNTER — Ambulatory Visit (INDEPENDENT_AMBULATORY_CARE_PROVIDER_SITE_OTHER): Payer: Medicaid Other | Admitting: *Deleted

## 2016-07-15 VITALS — BP 127/82 | HR 113

## 2016-07-15 DIAGNOSIS — O10913 Unspecified pre-existing hypertension complicating pregnancy, third trimester: Secondary | ICD-10-CM | POA: Diagnosis present

## 2016-07-18 ENCOUNTER — Ambulatory Visit (INDEPENDENT_AMBULATORY_CARE_PROVIDER_SITE_OTHER): Payer: Medicaid Other | Admitting: Obstetrics & Gynecology

## 2016-07-18 ENCOUNTER — Telehealth (HOSPITAL_COMMUNITY): Payer: Self-pay | Admitting: *Deleted

## 2016-07-18 ENCOUNTER — Encounter (HOSPITAL_COMMUNITY): Payer: Self-pay | Admitting: *Deleted

## 2016-07-18 VITALS — BP 140/88 | HR 106 | Wt 228.9 lb

## 2016-07-18 DIAGNOSIS — O10913 Unspecified pre-existing hypertension complicating pregnancy, third trimester: Secondary | ICD-10-CM

## 2016-07-18 DIAGNOSIS — O09893 Supervision of other high risk pregnancies, third trimester: Secondary | ICD-10-CM

## 2016-07-18 DIAGNOSIS — Z36 Encounter for antenatal screening of mother: Secondary | ICD-10-CM | POA: Diagnosis not present

## 2016-07-18 LAB — POCT URINALYSIS DIP (DEVICE)
Bilirubin Urine: NEGATIVE
Glucose, UA: NEGATIVE mg/dL
KETONES UR: NEGATIVE mg/dL
Nitrite: NEGATIVE
PH: 7 (ref 5.0–8.0)
PROTEIN: NEGATIVE mg/dL
Specific Gravity, Urine: 1.02 (ref 1.005–1.030)
UROBILINOGEN UA: 1 mg/dL (ref 0.0–1.0)

## 2016-07-18 NOTE — Telephone Encounter (Signed)
Preadmission screen  

## 2016-07-18 NOTE — Progress Notes (Signed)
Pt reports one episode of clear fluid leaking this morning - denies odor.

## 2016-07-18 NOTE — Progress Notes (Signed)
   PRENATAL VISIT NOTE  Subjective:  Shirley Terrell is a 30 y.o. G2P1001 at 6574w3d being seen today for ongoing prenatal care.  She is currently monitored for the following issues for this high-risk pregnancy and has Supervision of other high risk pregnancy, antepartum; Chronic hypertension during pregnancy, antepartum; Rubella non-immune status, antepartum; and Positive GBS test on her problem list.  Patient reports gush of fluid last night, none since  Contractions: Irregular. Vag. Bleeding: None.  Movement: Present.    The following portions of the patient's history were reviewed and updated as appropriate: allergies, current medications, past family history, past medical history, past social history, past surgical history and problem list. Problem list updated.  Objective:   Vitals:   07/18/16 0905  BP: 140/88  Pulse: (!) 106  Weight: 228 lb 14.4 oz (103.8 kg)    Fetal Status: Fetal Heart Rate (bpm): NST Fundal Height: 40 cm Movement: Present  Presentation: Vertex  General:  Alert, oriented and cooperative. Patient is in no acute distress.  Skin: Skin is warm and dry. No rash noted.   Cardiovascular: Normal heart rate noted  Respiratory: Normal respiratory effort, no problems with respiration noted  Abdomen: Soft, gravid, appropriate for gestational age. Pain/Pressure: Present     Pelvic:  Cervical exam performed Dilation: 4 Effacement (%): 50 Station: -2  No pool/ferning noted.   Extremities: Normal range of motion.  Edema: Trace  Mental Status: Normal mood and affect. Normal behavior. Normal judgment and thought content.   Urinalysis: Urine Protein: Trace Urine Glucose: Negative NST performed today was reviewed and was found to be reactive.  AFI was also normal at 13.1 cm.    Assessment and Plan:  Pregnancy: G2P1001 at 8074w3d  1. Chronic hypertension in pregnancy, third trimester Favorable cervix, desires IOL. IOL scheduled for 39 weeks given CHTN, favorable cervix.   2.  Supervision of other high risk pregnancy, antepartum, third trimester No evidence of ROM on exam, normal AFI. Preterm labor symptoms and general obstetric precautions including but not limited to vaginal bleeding, contractions, leaking of fluid and fetal movement were reviewed in detail with the patient. Please refer to After Visit Summary for other counseling recommendations.   Return in about 6 weeks (around 08/29/2016) for Postpartum check.  Shirley NewcomerUgonna A Aliveah Gallant, MD

## 2016-07-18 NOTE — Patient Instructions (Signed)
Labor Induction Labor induction is when steps are taken to cause a pregnant woman to begin the labor process. Most women go into labor on their own between 37 weeks and 42 weeks of the pregnancy. When this does not happen or when there is a medical need, methods may be used to induce labor. Labor induction causes a pregnant woman's uterus to contract. It also causes the cervix to soften (ripen), open (dilate), and thin out (efface). Usually, labor is not induced before 39 weeks of the pregnancy unless there is a problem with the baby or mother.  Before inducing labor, your health care provider will consider a number of factors, including the following:  The medical condition of you and the baby.   How many weeks along you are.   The status of the baby's lung maturity.   The condition of the cervix.   The position of the baby.  WHAT ARE THE REASONS FOR LABOR INDUCTION? Labor may be induced for the following reasons:  The health of the baby or mother is at risk.   The pregnancy is overdue by 1 week or more.   The water breaks but labor does not start on its own.   The mother has a health condition or serious illness, such as high blood pressure, infection, placental abruption, or diabetes.  The amniotic fluid amounts are low around the baby.   The baby is distressed.  Convenience or wanting the baby to be born on a certain date is not a reason for inducing labor. WHAT METHODS ARE USED FOR LABOR INDUCTION? Several methods of labor induction may be used, such as:   Prostaglandin medicine. This medicine causes the cervix to dilate and ripen. The medicine will also start contractions. It can be taken by mouth or by inserting a suppository into the vagina.   Inserting a thin tube (catheter) with a balloon on the end into the vagina to dilate the cervix. Once inserted, the balloon is expanded with water, which causes the cervix to open.   Stripping the membranes. Your health  care provider separates amniotic sac tissue from the cervix, causing the cervix to be stretched and causing the release of a hormone called progesterone. This may cause the uterus to contract. It is often done during an office visit. You will be sent home to wait for the contractions to begin. You will then come in for an induction.   Breaking the water. Your health care provider makes a hole in the amniotic sac using a small instrument. Once the amniotic sac breaks, contractions should begin. This may still take hours to see an effect.   Medicine to trigger or strengthen contractions. This medicine is given through an IV access tube inserted into a vein in your arm.  All of the methods of induction, besides stripping the membranes, will be done in the hospital. Induction is done in the hospital so that you and the baby can be carefully monitored.  HOW LONG DOES IT TAKE FOR LABOR TO BE INDUCED? Some inductions can take up to 2-3 days. Depending on the cervix, it usually takes less time. It takes longer when you are induced early in the pregnancy or if this is your first pregnancy. If a mother is still pregnant and the induction has been going on for 2-3 days, either the mother will be sent home or a cesarean delivery will be needed. WHAT ARE THE RISKS ASSOCIATED WITH LABOR INDUCTION? Some of the risks of induction include:     Changes in fetal heart rate, such as too high, too low, or erratic.   Fetal distress.   Chance of infection for the mother and baby.   Increased chance of having a cesarean delivery.   Breaking off (abruption) of the placenta from the uterus (rare).   Uterine rupture (very rare).  When induction is needed for medical reasons, the benefits of induction may outweigh the risks. WHAT ARE SOME REASONS FOR NOT INDUCING LABOR? Labor induction should not be done if:   It is shown that your baby does not tolerate labor.   You have had previous surgeries on your  uterus, such as a myomectomy or the removal of fibroids.   Your placenta lies very low in the uterus and blocks the opening of the cervix (placenta previa).   Your baby is not in a head-down position.   The umbilical cord drops down into the birth canal in front of the baby. This could cut off the baby's blood and oxygen supply.   You have had a previous cesarean delivery.   There are unusual circumstances, such as the baby being extremely premature.    This information is not intended to replace advice given to you by your health care provider. Make sure you discuss any questions you have with your health care provider.   Document Released: 03/25/2007 Document Revised: 11/24/2014 Document Reviewed: 06/02/2013 Elsevier Interactive Patient Education 2016 [employer] Inc[employer]  

## 2016-07-18 NOTE — Progress Notes (Signed)
Urine, small amount blood and leukocytes

## 2016-07-21 ENCOUNTER — Other Ambulatory Visit: Payer: Self-pay | Admitting: Advanced Practice Midwife

## 2016-07-22 ENCOUNTER — Inpatient Hospital Stay (HOSPITAL_COMMUNITY): Payer: Medicaid Other | Admitting: Anesthesiology

## 2016-07-22 ENCOUNTER — Inpatient Hospital Stay (HOSPITAL_COMMUNITY)
Admission: RE | Admit: 2016-07-22 | Discharge: 2016-07-24 | DRG: 774 | Disposition: A | Discharge status: Home / Self Care | Payer: Medicaid Other | Source: Ambulatory Visit | Attending: Family Medicine | Admitting: Family Medicine

## 2016-07-22 ENCOUNTER — Encounter (HOSPITAL_COMMUNITY): Payer: Self-pay

## 2016-07-22 DIAGNOSIS — J45909 Unspecified asthma, uncomplicated: Secondary | ICD-10-CM | POA: Diagnosis present

## 2016-07-22 DIAGNOSIS — Z833 Family history of diabetes mellitus: Secondary | ICD-10-CM | POA: Diagnosis not present

## 2016-07-22 DIAGNOSIS — Z8249 Family history of ischemic heart disease and other diseases of the circulatory system: Secondary | ICD-10-CM | POA: Diagnosis not present

## 2016-07-22 DIAGNOSIS — O10919 Unspecified pre-existing hypertension complicating pregnancy, unspecified trimester: Secondary | ICD-10-CM | POA: Diagnosis present

## 2016-07-22 DIAGNOSIS — Z823 Family history of stroke: Secondary | ICD-10-CM | POA: Diagnosis not present

## 2016-07-22 DIAGNOSIS — O99824 Streptococcus B carrier state complicating childbirth: Secondary | ICD-10-CM | POA: Diagnosis not present

## 2016-07-22 DIAGNOSIS — O9952 Diseases of the respiratory system complicating childbirth: Secondary | ICD-10-CM | POA: Diagnosis present

## 2016-07-22 DIAGNOSIS — O99214 Obesity complicating childbirth: Secondary | ICD-10-CM | POA: Diagnosis present

## 2016-07-22 DIAGNOSIS — O1002 Pre-existing essential hypertension complicating childbirth: Principal | ICD-10-CM | POA: Diagnosis present

## 2016-07-22 DIAGNOSIS — O99334 Smoking (tobacco) complicating childbirth: Secondary | ICD-10-CM | POA: Diagnosis present

## 2016-07-22 DIAGNOSIS — Z6841 Body Mass Index (BMI) 40.0 and over, adult: Secondary | ICD-10-CM | POA: Diagnosis not present

## 2016-07-22 DIAGNOSIS — Z3A39 39 weeks gestation of pregnancy: Secondary | ICD-10-CM

## 2016-07-22 DIAGNOSIS — O9902 Anemia complicating childbirth: Secondary | ICD-10-CM | POA: Diagnosis present

## 2016-07-22 DIAGNOSIS — F17210 Nicotine dependence, cigarettes, uncomplicated: Secondary | ICD-10-CM | POA: Diagnosis present

## 2016-07-22 DIAGNOSIS — O09899 Supervision of other high risk pregnancies, unspecified trimester: Secondary | ICD-10-CM

## 2016-07-22 DIAGNOSIS — O1092 Unspecified pre-existing hypertension complicating childbirth: Secondary | ICD-10-CM

## 2016-07-22 DIAGNOSIS — D649 Anemia, unspecified: Secondary | ICD-10-CM | POA: Diagnosis present

## 2016-07-22 DIAGNOSIS — O9989 Other specified diseases and conditions complicating pregnancy, childbirth and the puerperium: Secondary | ICD-10-CM

## 2016-07-22 DIAGNOSIS — O09893 Supervision of other high risk pregnancies, third trimester: Secondary | ICD-10-CM

## 2016-07-22 DIAGNOSIS — Z283 Underimmunization status: Secondary | ICD-10-CM

## 2016-07-22 LAB — CBC
HCT: 32.7 % — ABNORMAL LOW (ref 36.0–46.0)
HEMATOCRIT: 32.5 % — AB (ref 36.0–46.0)
HEMOGLOBIN: 10.9 g/dL — AB (ref 12.0–15.0)
Hemoglobin: 11.1 g/dL — ABNORMAL LOW (ref 12.0–15.0)
MCH: 30.2 pg (ref 26.0–34.0)
MCH: 29.8 pg (ref 26.0–34.0)
MCHC: 33.9 g/dL (ref 30.0–36.0)
MCHC: 33.5 g/dL (ref 30.0–36.0)
MCV: 88.9 fL (ref 78.0–100.0)
MCV: 88.8 fL (ref 78.0–100.0)
Platelets: 228 K/uL (ref 150–400)
Platelets: 220 10*3/uL (ref 150–400)
RBC: 3.68 MIL/uL — ABNORMAL LOW (ref 3.87–5.11)
RBC: 3.66 MIL/uL — ABNORMAL LOW (ref 3.87–5.11)
RDW: 13.1 % (ref 11.5–15.5)
RDW: 13.2 % (ref 11.5–15.5)
WBC: 13.4 K/uL — ABNORMAL HIGH (ref 4.0–10.5)
WBC: 16.3 10*3/uL — ABNORMAL HIGH (ref 4.0–10.5)

## 2016-07-22 LAB — SYPHILIS: RPR W/REFLEX TO RPR TITER AND TREPONEMAL ANTIBODIES, TRADITIONAL SCREENING AND DIAGNOSIS ALGORITHM: RPR Ser Ql: NONREACTIVE

## 2016-07-22 LAB — TYPE AND SCREEN
ABO/RH(D): O POS
Antibody Screen: NEGATIVE

## 2016-07-22 LAB — OB RESULTS CONSOLE GC/CHLAMYDIA: Gonorrhea: NEGATIVE

## 2016-07-22 MED ORDER — ONDANSETRON HCL 4 MG PO TABS: 4.0000 mg | ORAL_TABLET | ORAL | Status: DC | PRN

## 2016-07-22 MED ORDER — LIDOCAINE HCL (PF) 1 % IJ SOLN
30.0000 mL | INTRAMUSCULAR | Status: DC | PRN
  Filled 2016-07-22: qty 30

## 2016-07-22 MED ORDER — LIDOCAINE HCL (PF) 1 % IJ SOLN
INTRAMUSCULAR | Status: DC | PRN
  Administered 2016-07-22 (×2): 4 mL

## 2016-07-22 MED ORDER — LACTATED RINGERS IV SOLN: 500.0000 mL | Freq: Once | INTRAVENOUS | Status: DC

## 2016-07-22 MED ORDER — COCONUT OIL OIL: 1.0000 "application " | TOPICAL_OIL | Status: DC | PRN

## 2016-07-22 MED ORDER — DIPHENHYDRAMINE HCL 50 MG/ML IJ SOLN: 12.5000 mg | INTRAMUSCULAR | Status: DC | PRN

## 2016-07-22 MED ORDER — SENNOSIDES-DOCUSATE SODIUM 8.6-50 MG PO TABS
2.0000 | ORAL_TABLET | ORAL | Status: DC
  Administered 2016-07-23 – 2016-07-24 (×2): 2 via ORAL
  Filled 2016-07-22 (×2): qty 2

## 2016-07-22 MED ORDER — BENZOCAINE-MENTHOL 20-0.5 % EX AERO
1.0000 "application " | INHALATION_SPRAY | CUTANEOUS | Status: DC | PRN
  Administered 2016-07-23: 1 via TOPICAL
  Filled 2016-07-22: qty 56

## 2016-07-22 MED ORDER — OXYTOCIN BOLUS FROM INFUSION
500.0000 mL | Freq: Once | INTRAVENOUS | Status: AC
  Administered 2016-07-22: 500 mL via INTRAVENOUS

## 2016-07-22 MED ORDER — OXYTOCIN 40 UNITS IN LACTATED RINGERS INFUSION - SIMPLE MED: 2.5000 [IU]/h | INTRAVENOUS | Status: DC

## 2016-07-22 MED ORDER — ONDANSETRON HCL 4 MG/2ML IJ SOLN: 4.0000 mg | Freq: Four times a day (QID) | INTRAMUSCULAR | Status: DC | PRN

## 2016-07-22 MED ORDER — OXYCODONE-ACETAMINOPHEN 5-325 MG PO TABS: 1.0000 | ORAL_TABLET | ORAL | Status: DC | PRN

## 2016-07-22 MED ORDER — LACTATED RINGERS IV SOLN
INTRAVENOUS | Status: DC
  Administered 2016-07-22 (×3): via INTRAVENOUS

## 2016-07-22 MED ORDER — ZOLPIDEM TARTRATE 5 MG PO TABS: 5.0000 mg | ORAL_TABLET | Freq: Every evening | ORAL | Status: DC | PRN

## 2016-07-22 MED ORDER — TERBUTALINE SULFATE 1 MG/ML IJ SOLN
0.2500 mg | Freq: Once | INTRAMUSCULAR | Status: DC | PRN
  Filled 2016-07-22: qty 1

## 2016-07-22 MED ORDER — OXYTOCIN 40 UNITS IN LACTATED RINGERS INFUSION - SIMPLE MED
1.0000 m[IU]/min | INTRAVENOUS | Status: DC
  Administered 2016-07-22: 2 m[IU]/min via INTRAVENOUS
  Filled 2016-07-22: qty 1000

## 2016-07-22 MED ORDER — TETANUS-DIPHTH-ACELL PERTUSSIS 5-2.5-18.5 LF-MCG/0.5 IM SUSP: 0.5000 mL | Freq: Once | INTRAMUSCULAR | Status: DC

## 2016-07-22 MED ORDER — SIMETHICONE 80 MG PO CHEW: 80.0000 mg | CHEWABLE_TABLET | ORAL | Status: DC | PRN

## 2016-07-22 MED ORDER — ACETAMINOPHEN 325 MG PO TABS: 650.0000 mg | ORAL_TABLET | ORAL | Status: DC | PRN

## 2016-07-22 MED ORDER — EPHEDRINE 5 MG/ML INJ
10.0000 mg | INTRAVENOUS | Status: DC | PRN
  Filled 2016-07-22: qty 4

## 2016-07-22 MED ORDER — WITCH HAZEL-GLYCERIN EX PADS: 1.0000 "application " | MEDICATED_PAD | CUTANEOUS | Status: DC | PRN

## 2016-07-22 MED ORDER — ONDANSETRON HCL 4 MG/2ML IJ SOLN: 4.0000 mg | INTRAMUSCULAR | Status: DC | PRN

## 2016-07-22 MED ORDER — FENTANYL 2.5 MCG/ML BUPIVACAINE 1/10 % EPIDURAL INFUSION (WH - ANES)
14.0000 mL/h | INTRAMUSCULAR | Status: DC | PRN
  Administered 2016-07-22: 14 mL/h via EPIDURAL
  Filled 2016-07-22 (×2): qty 125

## 2016-07-22 MED ORDER — LACTATED RINGERS IV SOLN
INTRAVENOUS | Status: DC
  Administered 2016-07-22: 18:00:00 via INTRAUTERINE

## 2016-07-22 MED ORDER — PENICILLIN G POTASSIUM 5000000 UNITS IJ SOLR
2.5000 10*6.[IU] | INTRAVENOUS | Status: DC
  Administered 2016-07-22 (×2): 2.5 10*6.[IU] via INTRAVENOUS
  Filled 2016-07-22 (×4): qty 2.5

## 2016-07-22 MED ORDER — OXYCODONE-ACETAMINOPHEN 5-325 MG PO TABS: 2.0000 | ORAL_TABLET | ORAL | Status: DC | PRN

## 2016-07-22 MED ORDER — PHENYLEPHRINE 40 MCG/ML (10ML) SYRINGE FOR IV PUSH (FOR BLOOD PRESSURE SUPPORT)
80.0000 ug | PREFILLED_SYRINGE | INTRAVENOUS | Status: DC | PRN
  Filled 2016-07-22: qty 5

## 2016-07-22 MED ORDER — PRENATAL MULTIVITAMIN CH
1.0000 | ORAL_TABLET | Freq: Every day | ORAL | Status: DC
  Administered 2016-07-23: 1 via ORAL
  Filled 2016-07-22: qty 1

## 2016-07-22 MED ORDER — PENICILLIN G POTASSIUM 5000000 UNITS IJ SOLR
5.0000 10*6.[IU] | Freq: Once | INTRAVENOUS | Status: AC
  Administered 2016-07-22: 5 10*6.[IU] via INTRAVENOUS
  Filled 2016-07-22: qty 5

## 2016-07-22 MED ORDER — SOD CITRATE-CITRIC ACID 500-334 MG/5ML PO SOLN: 30.0000 mL | ORAL | Status: DC | PRN

## 2016-07-22 MED ORDER — FENTANYL CITRATE (PF) 100 MCG/2ML IJ SOLN: 100.0000 ug | INTRAMUSCULAR | Status: DC | PRN

## 2016-07-22 MED ORDER — LACTATED RINGERS IV SOLN: 500.0000 mL | INTRAVENOUS | Status: DC | PRN

## 2016-07-22 MED ORDER — DIPHENHYDRAMINE HCL 25 MG PO CAPS: 25.0000 mg | ORAL_CAPSULE | Freq: Four times a day (QID) | ORAL | Status: DC | PRN

## 2016-07-22 MED ORDER — IBUPROFEN 600 MG PO TABS
600.0000 mg | ORAL_TABLET | Freq: Four times a day (QID) | ORAL | Status: DC
  Administered 2016-07-23 – 2016-07-24 (×6): 600 mg via ORAL
  Filled 2016-07-22 (×6): qty 1

## 2016-07-22 MED ORDER — DIBUCAINE 1 % RE OINT: 1.0000 "application " | TOPICAL_OINTMENT | RECTAL | Status: DC | PRN

## 2016-07-22 MED ORDER — PHENYLEPHRINE 40 MCG/ML (10ML) SYRINGE FOR IV PUSH (FOR BLOOD PRESSURE SUPPORT)
80.0000 ug | PREFILLED_SYRINGE | INTRAVENOUS | Status: DC | PRN
  Filled 2016-07-22: qty 5
  Filled 2016-07-22: qty 10

## 2016-07-22 NOTE — Anesthesia Procedure Notes (Signed)
Epidural Patient location during procedure: OB  Staffing Anesthesiologist: Kenise Barraco Performed: anesthesiologist   Preanesthetic Checklist Completed: patient identified, site marked, surgical consent, pre-op evaluation, timeout performed, IV checked, risks and benefits discussed and monitors and equipment checked  Epidural Patient position: sitting Prep: site prepped and draped and DuraPrep Patient monitoring: continuous pulse ox and blood pressure Approach: midline Location: L3-L4 Injection technique: LOR saline  Needle:  Needle type: Tuohy  Needle gauge: 17 G Needle length: 9 cm and 9 Needle insertion depth: 5 cm cm Catheter type: closed end flexible Catheter size: 19 Gauge Catheter at skin depth: 10 cm Test dose: negative  Assessment Events: blood not aspirated, injection not painful, no injection resistance, negative IV test and no paresthesia  Additional Notes Patient identified. Risks/Benefits/Options discussed with patient including but not limited to bleeding, infection, nerve damage, paralysis, failed block, incomplete pain control, headache, blood pressure changes, nausea, vomiting, reactions to medication both or allergic, itching and postpartum back pain. Confirmed with bedside nurse the patient's most recent platelet count. Confirmed with patient that they are not currently taking any anticoagulation, have any bleeding history or any family history of bleeding disorders. Patient expressed understanding and wished to proceed. All questions were answered. Sterile technique was used throughout the entire procedure. Please see nursing notes for vital signs. Test dose was given through epidural catheter and negative prior to continuing to dose epidural or start infusion. Warning signs of high block given to the patient including shortness of breath, tingling/numbness in hands, complete motor block, or any concerning symptoms with instructions to call for help. Patient was  given instructions on fall risk and not to get out of bed. All questions and concerns addressed with instructions to call with any issues or inadequate analgesia.        

## 2016-07-22 NOTE — Progress Notes (Signed)
LABOR PROGRESS NOTE  Shirley Terrell is a 30 y.o. G2P1001 at 7669w0d  admitted for IOL for cHTN   Subjective: Doing well, feeling pressure.  Objective: BP (!) 136/96   Pulse (!) 108   Temp 98 F (36.7 C) (Oral)   Resp 18   Ht 5\' 3"  (1.6 m)   Wt 103.4 kg (228 lb)   LMP 10/15/2015 (Exact Date)   SpO2 99%   BMI 40.39 kg/m  or  Vitals:   07/22/16 1900 07/22/16 1901 07/22/16 1930 07/22/16 2030  BP:  129/78 127/89 (!) 136/96  Pulse:  97 (!) 103 (!) 108  Resp:   18 18  Temp: 98.6 F (37 C)  98 F (36.7 C)   TempSrc: Oral  Oral   SpO2:      Weight:      Height:         Dilation: Lip/rim Effacement (%): 90 Cervical Position: Middle Station: 0 Presentation: Vertex Exam by:: E. Siska, RN FHT: 145 baseline, moderate variability, +accelerations, no decelerations Uterine activity: q402min  Labs: Lab Results  Component Value Date   WBC 13.4 (H) 07/22/2016   HGB 11.1 (L) 07/22/2016   HCT 32.7 (L) 07/22/2016   MCV 88.9 07/22/2016   PLT 228 07/22/2016    Patient Active Problem List   Diagnosis Date Noted  . Chronic hypertension in pregnancy 07/22/2016  . Positive GBS test 07/08/2016  . Rubella non-immune status, antepartum 01/10/2016  . Supervision of other high risk pregnancy, antepartum 01/09/2016  . Chronic hypertension during pregnancy, antepartum 01/09/2016    Assessment / Plan: 30 y.o. G2P1001 at 7469w0d here for IOL for cHTN  Labor: pitocin Fetal Wellbeing:  Category I  Pain Control:  epidural Anticipated MOD:  SVD  Leland HerElsia J Addylin Manke, DO PGY-1 9/5/20179:10 PM

## 2016-07-22 NOTE — Progress Notes (Signed)
Brief Labor Progress Note  Patient comfortable with epidural.  SVE: 4-5 cm/50%/-1  AROM and fetal scalp placed at 1504 Clear amniotic fluid  Continue current labor plan.

## 2016-07-22 NOTE — Progress Notes (Signed)
Patient ID: Shirley Terrell, female   DOB: 02/28/1986, 28 y.o.   MRN: [id number]  LABOR PROGRESS NOTE  Shirley Terrell is a 30 y.o. G2P1001 at 3473w0d  admitted for IOL for chronic HTN  Subjective: Patient feeling more contractions. Trying to decide if she wants epidural.   Objective: BP (!) 138/92   Pulse 88   Temp 98.2 F (36.8 C) (Oral)   Resp 18   Ht 5\' 3"  (1.6 m)   Wt 228 lb (103.4 kg)   LMP 10/15/2015 (Exact Date)   BMI 40.39 kg/m  or  Vitals:   07/22/16 1157 07/22/16 1229 07/22/16 1307 07/22/16 1349  BP: (!) 137/91 133/89 137/89 (!) 138/92  Pulse: 96 96 93 88  Resp: 18     Temp: 98.2 F (36.8 C)     TempSrc: Oral     Weight:      Height:       NAD. Pleasant.   Last SVE: Dilation: 4.5 Effacement (%): 50 Station: -2 Presentation: Vertex Exam by:: J.Cox, RN  Toco: ctx q2-4 min FHT: baseline 140, moderate variability, +acel, no decel   Labs: Lab Results  Component Value Date   WBC 13.4 (H) 07/22/2016   HGB 11.1 (L) 07/22/2016   HCT 32.7 (L) 07/22/2016   MCV 88.9 07/22/2016   PLT 228 07/22/2016    Patient Active Problem List   Diagnosis Date Noted  . Chronic hypertension in pregnancy 07/22/2016  . Positive GBS test 07/08/2016  . Rubella non-immune status, antepartum 01/10/2016  . Supervision of other high risk pregnancy, antepartum 01/09/2016  . Chronic hypertension during pregnancy, antepartum 01/09/2016    Assessment / Plan: 30 y.o. G2P1001 at 4473w0d here for IOL for chronic HTN  Labor: Continue Pitocin. Will AROM after pt gets epidural Fetal Wellbeing:  Category I  Pain Control: pt will get epidural Anticipated MOD:  SVD  Frederik PearJulie P Alice Vitelli, MD 07/22/2016, 2:18 PM

## 2016-07-22 NOTE — Progress Notes (Signed)
Patient ID: Anna GenreJessica L Arras, female   DOB: 06/18/1986, 30 y.o.   MRN: [id number]  LABOR PROGRESS NOTE  Anna GenreJessica L Arif is a 30 y.o. G2P1001 at 6968w0d  admitted for IOL for chronic HTN  Subjective: Pain well controlled with epidural  Objective: BP 125/87   Pulse 90   Temp 98 F (36.7 C) (Oral)   Resp 18   Ht 5\' 3"  (1.6 m)   Wt 228 lb (103.4 kg)   LMP 10/15/2015 (Exact Date)   SpO2 99%   BMI 40.39 kg/m  or  Vitals:   07/22/16 1601 07/22/16 1631 07/22/16 1701 07/22/16 1731  BP: 133/77 111/63 126/87 125/87  Pulse: 92 88 91 90  Resp:  18    Temp:  98 F (36.7 C)    TempSrc:  Oral    SpO2:      Weight:      Height:       NAD. Pleasant.  SVE: 5 cm/70%/-2  Toco: ctx q2-3 min FHT: baseline 145, moderate variability, +acel, variable and early decels   Labs: Lab Results  Component Value Date   WBC 13.4 (H) 07/22/2016   HGB 11.1 (L) 07/22/2016   HCT 32.7 (L) 07/22/2016   MCV 88.9 07/22/2016   PLT 228 07/22/2016    Patient Active Problem List   Diagnosis Date Noted  . Chronic hypertension in pregnancy 07/22/2016  . Positive GBS test 07/08/2016  . Rubella non-immune status, antepartum 01/10/2016  . Supervision of other high risk pregnancy, antepartum 01/09/2016  . Chronic hypertension during pregnancy, antepartum 01/09/2016    Assessment / Plan: 30 y.o. G2P1001 at 5468w0d here for IOL for chronic HTN. S/p AROM at 15.   Labor: Continue Pitocin. IUPC placed to help titrate pit Fetal Wellbeing:  Category II - recurrent variables since AROM. IUPC placed. Will start amnioinfusion if decels persist.  Pain Control: Epidural Anticipated MOD:  SVD  Frederik PearJulie P Henreitta Spittler, MD 07/22/2016, 5:43 PM

## 2016-07-22 NOTE — H&P (Signed)
LABOR AND DELIVERY ADMISSION HISTORY AND PHYSICAL NOTE  Shirley Terrell is a 30 y.o. female G2P1001 with IUP at 10366w0d by 7-wk US, and pregnancy complicated by chronic HTN, h/o asthma, and anemia, presenting for IOL due to chronic HTN. She reports positive fetal movement. She denies leakage of fluid or vaginal bleeding.  Prenatal History/Complications: - Chronic HTN - on HCTZ prior to pregnancy, but no meds during pregnancy - Asthma - well controlled - Anemia  Past Medical History: Past Medical History:  Diagnosis Date  . Anemia   . Asthma    no problems since 2009  . Bacterial vaginosis   . Gallstones   . Hypertension   . Trichomonas     Past Surgical History: Past Surgical History:  Procedure Laterality Date  . CHOLECYSTECTOMY  04/13/2012   Procedure: LAPAROSCOPIC CHOLECYSTECTOMY WITH INTRAOPERATIVE CHOLANGIOGRAM;  Surgeon: Adolph Pollackodd J Rosenbower, MD;  Location: [facility] OR;  Service: General;  Laterality: N/A;  laparoscopic cholecystectomy with IOC    Obstetrical History: OB History    Gravida Para Term Preterm AB Living   2 1 1     1    SAB TAB Ectopic Multiple Live Births           1      Social History: Social History   Social History  . Marital status: Single    Spouse name: N/A  . Number of children: N/A  . Years of education: N/A   Social History Main Topics  . Smoking status: Current Some Day Smoker    Packs/day: 0.25    Types: Cigarettes  . Smokeless tobacco: Never Used     Comment: pt states 3cig per week  . Alcohol use No     Comment: socially  . Drug use: No  . Sexual activity: Yes    Partners: Male    Birth control/ protection: None, Condom   Other Topics Concern  . None   Social History Narrative  . None    Family History: Family History  Problem Relation Age of Onset  . Hypertension Mother   . Stroke Mother   . Heart disease Father   . Depression Father   . Cancer Paternal Grandmother     breast  . Diabetes Paternal Grandmother   .  Cancer Paternal Grandfather   . Hypertension Maternal Grandmother   . Diabetes Other     Allergies: No Known Allergies  Prescriptions Prior to Admission  Medication Sig Dispense Refill Last Dose  . acetaminophen (TYLENOL) 500 MG tablet Take 2 tablets (1,000 mg total) by mouth every 6 (six) hours as needed for fever or headache. 30 tablet 0 07/21/2016 at Unknown time  . albuterol (PROVENTIL HFA;VENTOLIN HFA) 108 (90 BASE) MCG/ACT inhaler Inhale 2 puffs into the lungs every 6 (six) hours as needed for wheezing. Reported on 04/11/2016   Past Month at Unknown time  . aspirin EC 81 MG tablet Take 1 tablet (81 mg total) by mouth daily. Take after 12 weeks for prevention of preeclampssia later in pregnancy 300 tablet 2 07/21/2016 at Unknown time  . ferrous sulfate 325 (65 FE) MG tablet Take 325 mg by mouth daily with breakfast.   07/21/2016 at Unknown time  . Prenat w/o A Vit-FeFum-FePo-FA (CONCEPT OB) 130-92.4-1 MG CAPS Take 1 tablet by mouth daily. 30 capsule 12 07/21/2016 at Unknown time  . cyclobenzaprine (FLEXERIL) 10 MG tablet Take 1 tablet (10 mg total) by mouth 3 (three) times daily as needed for muscle spasms. (Patient not taking:  Reported on 07/18/2016) 30 tablet 2 Not Taking at Unknown time     Review of Systems   All systems reviewed and negative except as stated in HPI  Blood pressure 129/90, pulse (!) 104, temperature 98.2 F (36.8 C), temperature source Oral, resp. rate 18, height 5\' 3"  (1.6 m), weight 228 lb (103.4 kg), last menstrual period 10/15/2015. General appearance: alert, cooperative and no distress Lungs: clear to auscultation bilaterally Heart: regular rate and rhythm Abdomen: soft, non-tender; bowel sounds normal Extremities: No calf swelling or tenderness Presentation: cephalic Fetal monitoring: baseline rate 145, moderate variability, +acel, no decel Uterine activity: irritability Dilation: 4.5 Effacement (%): 50 Station: -2 Exam by:: J.Cox, RN  Prenatal labs: ABO,  Rh: O/POS/-- (02/22 1337) Antibody: NEG (02/22 1337) Rubella: !Error! RPR: NON REAC (06/14 1527)  HBsAg: NEGATIVE (02/22 1337)  HIV: NONREACTIVE (06/14 1527)  GBS: Positive (08/23 0000)  1 hr Glucola: early - 127; 3rd trim - 126 Genetic screening:  1st trimester screen normal, AFP normal Anatomy US: Normal  Prenatal Transfer Tool  Maternal Diabetes: No Genetic Screening: Normal Maternal Ultrasounds/Referrals: Normal Fetal Ultrasounds or other Referrals:  None Maternal Substance Abuse:  No Significant Maternal Medications:  None Significant Maternal Lab Results: None  Results for orders placed or performed during the hospital encounter of 07/22/16 (from the past 24 hour(s))  OB RESULTS CONSOLE GC/Chlamydia   Collection Time: 07/22/16 12:00 AM  Result Value Ref Range   Gonorrhea Negative     Patient Active Problem List   Diagnosis Date Noted  . Chronic hypertension in pregnancy 07/22/2016  . Positive GBS test 07/08/2016  . Rubella non-immune status, antepartum 01/10/2016  . Supervision of other high risk pregnancy, antepartum 01/09/2016  . Chronic hypertension during pregnancy, antepartum 01/09/2016    Assessment: Shirley Terrell is a 30 y.o. G2P1001 at [redacted]w[redacted]d by 7-wk [place], and pregnancy complicated by chronic HTN, h/o asthma, and anemia, here for IOL of d/t chronic hypertension.  #labor: Favorable cervix. Start Pitocin #Pain: May have Epidural in active labor #FWB: Category 1 #ID:  GBS +. Will given PCN #MOF: breast #MOC:Nexplanon #Circ:  N/a, gril #Chronic HTN: Monitor BP in labor  Kandra Nicolas Degele 07/22/2016, 9:11 AM  OB FELLOW HISTORY AND PHYSICAL ATTESTATION  I have seen and examined this patient; I agree with above documentation in the resident's note.    Ernestina Penna 07/22/2016, 2:27 PM

## 2016-07-22 NOTE — Anesthesia Pain Management Evaluation Note (Signed)
  [facility] Pain Management Visit Note  Patient: Shirley GenreJessica L Mcilvaine, 30 y.o., female  "Hello I am a member of the anesthesia team at [facility]. We have an anesthesia team available at all times to provide care throughout the hospital, including epidural management and anesthesia for C-section. I don't know your plan for the delivery whether it a natural birth, water birth, IV sedation, nitrous supplementation, doula or epidural, but we want to meet your pain goals."   1.Was your pain managed to your expectations on prior hospitalizations?   Yes   2.What is your expectation for pain management during this hospitalization?     Epidural  3.How can we help you reach that goal? epidural  Record the patient's initial score and the patient's pain goal.   Pain: 4  Pain Goal: 8 [facility] wants you to be able to say your pain was always managed very well.  Edison PaceWILKERSON,Jeptha Hinnenkamp 07/22/2016

## 2016-07-22 NOTE — Anesthesia Preprocedure Evaluation (Signed)
Anesthesia Evaluation  Patient identified by MRN, date of birth, ID band Patient awake    Reviewed: Allergy & Precautions, NPO status , Patient's Chart, lab work & pertinent test results  History of Anesthesia Complications Negative for: history of anesthetic complications  Airway Mallampati: II  TM Distance: >3 FB Neck ROM: Full    Dental no notable dental hx. (+) Dental Advisory Given, Poor Dentition   Pulmonary asthma , Current Smoker,    Pulmonary exam normal breath sounds clear to auscultation       Cardiovascular hypertension (PreE), Pt. on medications and Pt. on home beta blockers Normal cardiovascular exam Rhythm:Regular Rate:Normal     Neuro/Psych negative neurological ROS  negative psych ROS   GI/Hepatic negative GI ROS, Neg liver ROS,   Endo/Other  Morbid obesity  Renal/GU negative Renal ROS  negative genitourinary   Musculoskeletal negative musculoskeletal ROS (+)   Abdominal   Peds negative pediatric ROS (+)  Hematology negative hematology ROS (+)   Anesthesia Other Findings   Reproductive/Obstetrics negative OB ROS                             Anesthesia Physical Anesthesia Plan  ASA: III  Anesthesia Plan: Epidural   Post-op Pain Management:    Induction:   Airway Management Planned:   Additional Equipment:   Intra-op Plan:   Post-operative Plan:   Informed Consent: I have reviewed the patients History and Physical, chart, labs and discussed the procedure including the risks, benefits and alternatives for the proposed anesthesia with the patient or authorized representative who has indicated his/her understanding and acceptance.   Dental advisory given  Plan Discussed with: CRNA  Anesthesia Plan Comments:         Anesthesia Quick Evaluation

## 2016-07-22 NOTE — Progress Notes (Signed)
NST Note Date: 07/15/2016 Gestational Age: 28/0 FHT: 135 baseline, +accels, no decel, mod variability Toco: +irritability  A/P: rNST. Continue current plan of care  Cornelia Copaharlie Philopateer Strine, Jr MD Attending Center for [facility](Faculty Practice)

## 2016-07-23 ENCOUNTER — Other Ambulatory Visit: Payer: Medicaid Other

## 2016-07-23 MED ORDER — PNEUMOCOCCAL VAC POLYVALENT 25 MCG/0.5ML IJ INJ
0.5000 mL | INJECTION | Freq: Once | INTRAMUSCULAR | Status: AC
  Administered 2016-07-23: 0.5 mL via INTRAMUSCULAR
  Filled 2016-07-23: qty 0.5

## 2016-07-23 NOTE — Lactation Note (Signed)
This note was copied from a baby's chart. Lactation Consultation Note Experienced BF mom has good everted nipples. Mom BF in cradle position. Discussed different BF options. Mom has WIC. Mom encouraged to feed baby 8-12 times/24 hours and with feeding cues. Referred to Baby and Me Book in Breastfeeding section Pg. 22-23 for position options and Proper latch demonstration. Educated about newborn behavior, cluster feeding, and STS. WH/LC brochure given w/resources, support groups and LC services. Patient Name: Girl Caryn BeeJessica Smouse Today's Date: 07/23/2016 Reason for consult: Initial assessment   Maternal Data Has patient been taught Hand Expression?: Yes  Feeding Feeding Type: Breast Fed Length of feed: 10 min  LATCH Score/Interventions Latch: Repeated attempts needed to sustain latch, nipple held in mouth throughout feeding, stimulation needed to elicit sucking reflex. Intervention(s): Adjust position;Assist with latch;Breast massage;Breast compression  Audible Swallowing: None Intervention(s): Skin to skin;Hand expression  Type of Nipple: Everted at rest and after stimulation  Comfort (Breast/Nipple): Soft / non-tender     Hold (Positioning): Assistance needed to correctly position infant at breast and maintain latch. Intervention(s): Breastfeeding basics reviewed;Support Pillows;Position options;Skin to skin  LATCH Score: 6  Lactation Tools Discussed/Used WIC Program: Yes   Consult Status Consult Status: Follow-up Date: 07/23/16 (in pm) Follow-up type: In-patient    Charyl DancerCARVER, Dianey Suchy G 07/23/2016, 5:46 AM

## 2016-07-23 NOTE — Plan of Care (Signed)
Problem: Activity: Goal: Will verbalize the importance of balancing activity with adequate rest periods Outcome: Completed/Met Date Met: 07/23/16 Patient complaining of mild back pain from epidural site. Encouraged frequent ambulation and sitting in chair instead of bed to assist with comfort.  Problem: Pain Management: Goal: General experience of comfort will improve and pain level will decrease Outcome: Completed/Met Date Met: 07/23/16 Provided patient with Kpad for back pain. Encouraged to call if that was not effective.

## 2016-07-23 NOTE — Anesthesia Postprocedure Evaluation (Signed)
Anesthesia Post Note  Patient: Shirley Terrell  Procedure(s) Performed: * No procedures listed *  Patient location during evaluation: Mother Baby Anesthesia Type: Epidural Level of consciousness: awake Pain management: satisfactory to patient Vital Signs Assessment: post-procedure vital signs reviewed and stable Respiratory status: spontaneous breathing Cardiovascular status: stable Anesthetic complications: no     Last Vitals:  Vitals:   07/23/16 0450 07/23/16 0542  BP: (!) 159/92 132/73  Pulse: 87 86  Resp: 18   Temp: 36.9 C     Last Pain:  Vitals:   07/23/16 0542  TempSrc:   PainSc: 0-No pain   Pain Goal:                 KeyCorpBURGER,Tyreek Clabo

## 2016-07-23 NOTE — Progress Notes (Signed)
Notified Dr. Artist PaisYoo of pt's BP of 159/92. No S&S PIH, pt without c/o headache which she states she has with elevated BP. [facility] No new orders received at this time. Will continue to monitor pt and recheck BP.

## 2016-07-23 NOTE — Progress Notes (Signed)
Post Partum Day 1 Subjective: up ad lib, voiding, tolerating PO and RUQ pain thinks from baby being far over to R during labor  Objective: Blood pressure 132/73, pulse 86, temperature 98.5 F (36.9 C), temperature source Oral, resp. rate 18, height 5\' 3"  (1.6 m), weight 103.4 kg (228 lb), last menstrual period 10/15/2015, SpO2 99 %, unknown if currently breastfeeding.  Physical Exam:  General: alert, cooperative and no distress Lochia: appropriate Uterine Fundus: firm DVT Evaluation: No evidence of DVT seen on physical exam. Negative Homan's sign.   Recent Labs  07/22/16 0820 07/22/16 2250  HGB 11.1* 10.9*  HCT 32.7* 32.5*    Assessment/Plan: Plan for discharge tomorrow   LOS: 1 day   Leland HerElsia J Yoo PGY-1 07/23/2016, 7:00 AM    OB FELLOW POSTPARTUM PROGRESS NOTE ATTESTATION  I have seen and examined this patient and agree with above documentation in the resident's note.   Ernestina PennaNicholas Schenk, MD 7:20 AM

## 2016-07-23 NOTE — Lactation Note (Signed)
This note was copied from a baby's chart. Lactation Consultation Note  Patient Name: Shirley Terrell: 07/23/2016 Reason for consult: Follow-up assessment Baby at 21 hr of life. Experienced bf mom reports feedings are "perfect". She denies breast or nipple pain, voiced no concerns. She requested a Harmony to take home. Discussed baby behavior, feeding frequency, voids, wt loss, breast changes, and nipple care. She is aware of lactation services and support group. She will call as needed.    Maternal Data    Feeding Feeding Type: Breast Fed Length of feed: 15 min  LATCH Score/Interventions Latch:  (enc mother to call for latch score/assist)                    Lactation Tools Discussed/Used     Consult Status Consult Status: PRN    Rulon Eisenmengerlizabeth E Baldemar Dady 07/23/2016, 6:50 PM

## 2016-07-24 MED ORDER — IBUPROFEN 600 MG PO TABS: 600.0000 mg | ORAL_TABLET | Freq: Four times a day (QID) | ORAL | 0 refills | Status: DC

## 2016-07-24 MED ORDER — SENNOSIDES-DOCUSATE SODIUM 8.6-50 MG PO TABS: 2.0000 | ORAL_TABLET | ORAL | 0 refills | Status: DC

## 2016-07-24 MED ORDER — MEASLES, MUMPS & RUBELLA VAC ~~LOC~~ INJ
0.5000 mL | INJECTION | Freq: Once | SUBCUTANEOUS | Status: AC
  Administered 2016-07-24: 0.5 mL via SUBCUTANEOUS
  Filled 2016-07-24: qty 0.5

## 2016-07-24 NOTE — Discharge Summary (Signed)
OB Discharge Summary     Patient Name: Shirley Terrell DOB: [birth year] MRN: [id number]  Date of admission: 07/22/2016 Delivering MD: Waldemar Dickens   Date of discharge: 07/24/2016  Admitting diagnosis: 38wks, induction Intrauterine pregnancy: [redacted]w[redacted]d    Secondary diagnosis:  Active Problems:   Chronic hypertension in pregnancy   SVD (spontaneous vaginal delivery)  Additional problems: h/o asthma, h/o anemia     Discharge diagnosis: Term Pregnancy Delivered and CHTN                                                                                                Post partum procedures:MMR  Augmentation: AROM and Pitocin  Complications: None  Hospital course:  Induction of Labor With Vaginal Delivery   30y.o. yo G2P2002 at 380w0das admitted to the hospital 07/22/2016 for induction of labor.  Indication for induction: cHTN.  Patient had an uncomplicated labor course as follows: Membrane Rupture Time/Date: 3:04 PM ,07/22/2016   Intrapartum Procedures: Episiotomy: None [1]                                         Lacerations:  Periurethral [8]  Patient had delivery of a Viable infant.  Information for the patient's newborn:  HaBrooks, Stotz0[[id number]]Delivery Method: Vag-Spont   07/22/2016  Details of delivery can be found in separate delivery note.  Patient had a routine postpartum course. Patient is discharged home 07/24/16.   Physical exam  Vitals:   07/23/16 0930 07/23/16 1800 07/23/16 2130 07/24/16 0623  BP: 134/86 127/88 (!) 119/56 (!) 138/95  Pulse: 92 91 (!) 111 84  Resp: 16 18 18 18   Temp: 98.5 F (36.9 C) 98.4 F (36.9 C) 98.3 F (36.8 C) 98 F (36.7 C)  TempSrc: Oral  Oral Oral  SpO2:   95%   Weight:      Height:       General: alert, cooperative and no distress Lochia: appropriate Uterine Fundus: firm DVT Evaluation: No evidence of DVT seen on physical exam. Negative Homan's sign. Labs: Lab Results  Component Value Date   WBC 16.3 (H)  07/22/2016   HGB 10.9 (L) 07/22/2016   HCT 32.5 (L) 07/22/2016   MCV 88.8 07/22/2016   PLT 220 07/22/2016   CMP Latest Ref Rng & Units 02/07/2016  Glucose 65 - 99 mg/dL 91  BUN 7 - 25 mg/dL 5(L)  Creatinine 0.50 - 1.10 mg/dL 0.55  Sodium 135 - 146 mmol/L 137  Potassium 3.5 - 5.3 mmol/L 4.1  Chloride 98 - 110 mmol/L 105  CO2 20 - 31 mmol/L 24  Calcium 8.6 - 10.2 mg/dL 8.7  Total Protein 6.1 - 8.1 g/dL 6.2  Total Bilirubin 0.2 - 1.2 mg/dL 0.3  Alkaline Phos 33 - 115 U/L 73  AST 10 - 30 U/L 18  ALT 6 - 29 U/L 31(H)    Discharge instruction: per After Visit Summary and "Baby and Me Booklet".  After visit meds:  Medication List    STOP taking these medications   aspirin EC 81 MG tablet   cyclobenzaprine 10 MG tablet Commonly known as:  FLEXERIL     TAKE these medications   acetaminophen 500 MG tablet Commonly known as:  TYLENOL Take 2 tablets (1,000 mg total) by mouth every 6 (six) hours as needed for fever or headache.   albuterol 108 (90 Base) MCG/ACT inhaler Commonly known as:  PROVENTIL HFA;VENTOLIN HFA Inhale 2 puffs into the lungs every 6 (six) hours as needed for wheezing. Reported on 04/11/2016   CONCEPT OB 130-92.4-1 MG Caps Take 1 tablet by mouth daily.   ferrous sulfate 325 (65 FE) MG tablet Take 325 mg by mouth daily with breakfast.   ibuprofen 600 MG tablet Commonly known as:  ADVIL,MOTRIN Take 1 tablet (600 mg total) by mouth every 6 (six) hours.   senna-docusate 8.6-50 MG tablet Commonly known as:  Senokot-S Take 2 tablets by mouth daily.       Diet: routine diet  Activity: Advance as tolerated. Pelvic rest for 6 weeks.   Outpatient follow up:2 weeks Follow up Appt: Future Appointments Date Time Provider [place]  08/28/2016 1:20 PM Chancy Milroy, MD [place]   Follow up Visit: [place] for [facility]. Schedule an appointment as soon as possible for a visit in 2 week(s).    Specialty:  Obstetrics and Gynecology Why:  postpartum visit Contact information: [place] [phone]          Postpartum contraception: Nexplanon  Newborn Data: Live born female  Birth Weight: 7 lb 3.7 oz (3280 g) APGAR: 8, 9  Baby Feeding: Breast Disposition:home with mother   07/24/2016 Bufford Lope, DO PGY-1  CNM attestation I have seen and examined this patient and agree with above documentation in the resident's note.   Shirley Terrell is a 30 y.o. [id number] s/p SVD.   Pain is well controlled.  Plan for birth control is Nexplanon.  Method of Feeding: breast  PE:  BP (!) 138/95 (BP Location: Left Arm)   Pulse 84   Temp 98 F (36.7 C) (Oral)   Resp 18   Ht 5' 3"  (1.6 m)   Wt 103.4 kg (228 lb)   LMP 10/15/2015 (Exact Date)   SpO2 95%   Breastfeeding? Unknown   BMI 40.39 kg/m  Fundus firm   Recent Labs  07/22/16 0820 07/22/16 2250  HGB 11.1* 10.9*  HCT 32.7* 32.5*     Plan: discharge today - postpartum care discussed - f/u clinic in 6 weeks for postpartum visit   Serita Grammes, CNM 9:40 AM  07/24/2016

## 2016-07-24 NOTE — Lactation Note (Signed)
This note was copied from a baby's chart. Lactation Consultation Note  Mother easily expressed drops of bm. Observed mother latching.  Intermittent sucks and swallows observed. Reviewed engorgement care and monitoring voids/stools.   Patient Name: Girl Caryn BeeJessica Dandy [id number]'YToday's Date: 07/24/2016 Reason for consult: Follow-up assessment   Maternal Data    Feeding Feeding Type: Breast Fed Length of feed: 10 min  LATCH Score/Interventions Latch: Grasps breast easily, tongue down, lips flanged, rhythmical sucking.  Audible Swallowing: A few with stimulation  Type of Nipple: Everted at rest and after stimulation  Comfort (Breast/Nipple): Soft / non-tender     Hold (Positioning): No assistance needed to correctly position infant at breast.  LATCH Score: 9  Lactation Tools Discussed/Used     Consult Status Consult Status: Complete    Hardie PulleyBerkelhammer, Ruth Boschen 07/24/2016, 11:10 AM

## 2016-07-24 NOTE — Discharge Instructions (Signed)

## 2016-07-24 NOTE — Plan of Care (Signed)
Problem: Bowel/Gastric: Goal: Gastrointestinal status will improve Outcome: Completed/Met Date Met: 07/24/16 Patient has had a bowel movement since delivery.

## 2016-07-24 NOTE — Lactation Note (Signed)
This note was copied from a baby's chart. Lactation Consultation Note Experienced BF mom.. Baby had 5 % weight loss. BF well, cluster feeding. Had 6 voids, 2 stools at 30 hrs. Old. No stool in 24 hrs. Baby's bili level slightly high. Hand expression show mom has easy flow of colostrum.  Patient Name: Shirley Terrell AVWUJ'WToday's Date: 07/24/2016 Reason for consult: Follow-up assessment;Infant weight loss   Maternal Data    Feeding Feeding Type: Breast Fed Length of feed: 15 min  LATCH Score/Interventions Latch: Grasps breast easily, tongue down, lips flanged, rhythmical sucking.  Audible Swallowing: Spontaneous and intermittent Intervention(s): Hand expression;Skin to skin Intervention(s): Hand expression  Type of Nipple: Everted at rest and after stimulation  Comfort (Breast/Nipple): Soft / non-tender     Hold (Positioning): No assistance needed to correctly position infant at breast.  LATCH Score: 10  Lactation Tools Discussed/Used     Consult Status Consult Status: PRN Date: 07/24/16 Follow-up type: In-patient    Shirley Terrell, Shirley Terrell 07/24/2016, 4:29 AM

## 2016-07-25 ENCOUNTER — Other Ambulatory Visit: Payer: Medicaid Other | Admitting: Obstetrics and Gynecology

## 2016-08-06 ENCOUNTER — Other Ambulatory Visit: Payer: Self-pay

## 2016-08-06 ENCOUNTER — Telehealth: Payer: Self-pay | Admitting: *Deleted

## 2016-08-06 NOTE — Telephone Encounter (Signed)
I have spoken with provider who suggested patient use a little of baby medication and apply to patient nipple and if this does not clear up in a couple days. Patient should call us back to scheduled an appointment. Patient verbalizes understanding.

## 2016-08-06 NOTE — Telephone Encounter (Signed)
Patient newborn has a

## 2016-08-06 NOTE — Telephone Encounter (Signed)
Pt left message yesterday @ 1112 stating that her baby has thrush and she was told to call our office to get a prescription for herself.

## 2016-08-28 ENCOUNTER — Ambulatory Visit (INDEPENDENT_AMBULATORY_CARE_PROVIDER_SITE_OTHER): Payer: Medicaid Other | Admitting: Obstetrics and Gynecology

## 2016-08-28 ENCOUNTER — Encounter: Payer: Self-pay | Admitting: Obstetrics and Gynecology

## 2016-08-28 DIAGNOSIS — Z30017 Encounter for initial prescription of implantable subdermal contraceptive: Secondary | ICD-10-CM

## 2016-08-28 DIAGNOSIS — Z3202 Encounter for pregnancy test, result negative: Secondary | ICD-10-CM | POA: Diagnosis not present

## 2016-08-28 LAB — POCT PREGNANCY, URINE: Preg Test, Ur: NEGATIVE

## 2016-08-28 MED ORDER — ETONOGESTREL 68 MG ~~LOC~~ IMPL
68.0000 mg | DRUG_IMPLANT | Freq: Once | SUBCUTANEOUS | Status: AC
  Administered 2016-08-28: 68 mg via SUBCUTANEOUS

## 2016-08-28 NOTE — Progress Notes (Signed)
Shirley Terrell is a 30 y.o. [phone] here  Nexplanon insertion.  No other gynecologic concerns. UPT negative  Nexplanon Insertion Procedure Patient identified, informed consent performed, consent signed.   Patient does understand that irregular bleeding is a very common side effect of this medication. She was advised to have backup contraception for one week after placement. Pregnancy test in clinic today was negative.  Appropriate time out taken.  Patient's left arm was prepped and draped in the usual sterile fashion.. The ruler used to measure and mark insertion area.  Patient was prepped with alcohol swab and then injected with 3 ml of 1% lidocaine.  She was prepped with betadine, Nexplanon removed from packaging,  Device confirmed in needle, then inserted full length of needle and withdrawn per handbook instructions. Nexplanon was able to palpated in the patient's arm; patient palpated the insert herself. There was minimal blood loss.  Patient insertion site covered with guaze and a pressure bandage to reduce any bruising.  The patient tolerated the procedure well and was given post procedure instructions.   Hermina StaggersMichael L Jowan Skillin, MD, FACOG Attending Obstetrician & Gynecologist Center for [facility], [facility]

## 2016-08-28 NOTE — Progress Notes (Signed)
Subjective:     Shirley Terrell is a 30 y.o. female who presents for a postpartum visit. She is 5 weeks postpartum weeks postpartum following a spontaneous vaginal delivery. I have fully reviewed the prenatal and intrapartum course. The delivery was at 38 gestational weeks. Outcome: vaginal delivery. Anesthesia: Epidual. Postpartum course has been   Baby's course has been uncomplicated. Baby is feeding by breast. Bleeding not at this. Bowel function is normal. Bladder function is normal. Patient is sexually active. Contraception method is Nexplanon. Postpartum depression screening: negative  The following portions of the patient's history were reviewed and updated as appropriate: allergies, current medications, past family history, past medical history, past social history and past surgical history.  Review of Systems Pertinent items are noted in HPI.   Objective:    BP (!) 162/120   Pulse 98   Wt 206 lb 8 oz (93.7 kg)   BMI 36.58 kg/m   General:  alert   Breasts:  Not evaluate  Lungs: clear to auscultation bilaterally  Heart:  regular rate and rhythm, S1, S2 normal, no murmur, click, rub or gallop  Abdomen: soft, non-tender; bowel sounds normal; no masses,  no organomegaly   Vulva:  not evaluated  Vagina: not evaluated  Cervix:  not examined  Corpus: not examined  Adnexa:  not evaluated  Rectal Exam: Not performed.        Assessment:     Normal  postpartum exam.   Plan:    1. Contraception: Nexplanon 3. Follow up PRN

## 2016-08-28 NOTE — Patient Instructions (Signed)
Etonogestrel implant What is this medicine? ETONOGESTREL (et oh noe JES trel) is a contraceptive (birth control) device. It is used to prevent pregnancy. It can be used for up to 3 years. This medicine may be used for other purposes; ask your health care provider or pharmacist if you have questions. What should I tell my health care provider before I take this medicine? They need to know if you have any of these conditions: -abnormal vaginal bleeding -blood vessel disease or blood clots -cancer of the breast, cervix, or liver -depression -diabetes -gallbladder disease -headaches -heart disease or recent heart attack -high blood pressure -high cholesterol -kidney disease -liver disease -renal disease -seizures -tobacco smoker -an unusual or allergic reaction to etonogestrel, other hormones, anesthetics or antiseptics, medicines, foods, dyes, or preservatives -pregnant or trying to get pregnant -breast-feeding How should I use this medicine? This device is inserted just under the skin on the inner side of your upper arm by a health care professional. Talk to your pediatrician regarding the use of this medicine in children. Special care may be needed. Overdosage: If you think you have taken too much of this medicine contact a poison control center or emergency room at once. NOTE: This medicine is only for you. Do not share this medicine with others. What if I miss a dose? This does not apply. What may interact with this medicine? Do not take this medicine with any of the following medications: -amprenavir -bosentan -fosamprenavir This medicine may also interact with the following medications: -barbiturate medicines for inducing sleep or treating seizures -certain medicines for fungal infections like ketoconazole and itraconazole -griseofulvin -medicines to treat seizures like carbamazepine, felbamate, oxcarbazepine, phenytoin,  topiramate -modafinil -phenylbutazone -rifampin -some medicines to treat HIV infection like atazanavir, indinavir, lopinavir, nelfinavir, tipranavir, ritonavir -St. John's wort This list may not describe all possible interactions. Give your health care provider a list of all the medicines, herbs, non-prescription drugs, or dietary supplements you use. Also tell them if you smoke, drink alcohol, or use illegal drugs. Some items may interact with your medicine. What should I watch for while using this medicine? This product does not protect you against HIV infection (AIDS) or other sexually transmitted diseases. You should be able to feel the implant by pressing your fingertips over the skin where it was inserted. Contact your doctor if you cannot feel the implant, and use a non-hormonal birth control method (such as condoms) until your doctor confirms that the implant is in place. If you feel that the implant may have broken or become bent while in your arm, contact your healthcare provider. What side effects may I notice from receiving this medicine? Side effects that you should report to your doctor or health care professional as soon as possible: -allergic reactions like skin rash, itching or hives, swelling of the face, lips, or tongue -breast lumps -changes in emotions or moods -depressed mood -heavy or prolonged menstrual bleeding -pain, irritation, swelling, or bruising at the insertion site -scar at site of insertion -signs of infection at the insertion site such as fever, and skin redness, pain or discharge -signs of pregnancy -signs and symptoms of a blood clot such as breathing problems; changes in vision; chest pain; severe, sudden headache; pain, swelling, warmth in the leg; trouble speaking; sudden numbness or weakness of the face, arm or leg -signs and symptoms of liver injury like dark yellow or brown urine; general ill feeling or flu-like symptoms; light-colored stools; loss of  appetite; nausea; right upper belly   pain; unusually weak or tired; yellowing of the eyes or skin -unusual vaginal bleeding, discharge -signs and symptoms of a stroke like changes in vision; confusion; trouble speaking or understanding; severe headaches; sudden numbness or weakness of the face, arm or leg; trouble walking; dizziness; loss of balance or coordination Side effects that usually do not require medical attention (Report these to your doctor or health care professional if they continue or are bothersome.): -acne -back pain -breast pain -changes in weight -dizziness -general ill feeling or flu-like symptoms -headache -irregular menstrual bleeding -nausea -sore throat -vaginal irritation or inflammation This list may not describe all possible side effects. Call your doctor for medical advice about side effects. You may report side effects to FDA at 1-800-FDA-1088. Where should I keep my medicine? This drug is given in a hospital or clinic and will not be stored at home. NOTE: This sheet is a summary. It may not cover all possible information. If you have questions about this medicine, talk to your doctor, pharmacist, or health care provider.    2016, Elsevier/Gold Standard. (2014-08-18 14:07:06)  

## 2016-11-30 ENCOUNTER — Emergency Department (HOSPITAL_COMMUNITY): Payer: Medicaid Other

## 2016-11-30 ENCOUNTER — Encounter (HOSPITAL_COMMUNITY): Payer: Self-pay

## 2016-11-30 ENCOUNTER — Emergency Department (HOSPITAL_COMMUNITY)
Admission: EM | Admit: 2016-11-30 | Discharge: 2016-12-01 | Disposition: A | Discharge status: Home / Self Care | Payer: Medicaid Other | Attending: Emergency Medicine | Admitting: Emergency Medicine

## 2016-11-30 DIAGNOSIS — Y939 Activity, unspecified: Secondary | ICD-10-CM | POA: Insufficient documentation

## 2016-11-30 DIAGNOSIS — W010XXA Fall on same level from slipping, tripping and stumbling without subsequent striking against object, initial encounter: Secondary | ICD-10-CM | POA: Insufficient documentation

## 2016-11-30 DIAGNOSIS — F17210 Nicotine dependence, cigarettes, uncomplicated: Secondary | ICD-10-CM | POA: Insufficient documentation

## 2016-11-30 DIAGNOSIS — S93325A Dislocation of tarsometatarsal joint of left foot, initial encounter: Secondary | ICD-10-CM | POA: Diagnosis not present

## 2016-11-30 DIAGNOSIS — J45909 Unspecified asthma, uncomplicated: Secondary | ICD-10-CM | POA: Diagnosis not present

## 2016-11-30 DIAGNOSIS — S92325A Nondisplaced fracture of second metatarsal bone, left foot, initial encounter for closed fracture: Secondary | ICD-10-CM | POA: Diagnosis not present

## 2016-11-30 DIAGNOSIS — I10 Essential (primary) hypertension: Secondary | ICD-10-CM | POA: Insufficient documentation

## 2016-11-30 DIAGNOSIS — Y999 Unspecified external cause status: Secondary | ICD-10-CM | POA: Insufficient documentation

## 2016-11-30 DIAGNOSIS — Y92007 Garden or yard of unspecified non-institutional (private) residence as the place of occurrence of the external cause: Secondary | ICD-10-CM | POA: Diagnosis not present

## 2016-11-30 DIAGNOSIS — S99922A Unspecified injury of left foot, initial encounter: Secondary | ICD-10-CM | POA: Diagnosis present

## 2016-11-30 DIAGNOSIS — S93326A Dislocation of tarsometatarsal joint of unspecified foot, initial encounter: Secondary | ICD-10-CM

## 2016-11-30 HISTORY — DX: Dislocation of tarsometatarsal joint of unspecified foot, initial encounter: S93.326A

## 2016-11-30 MED ORDER — ACETAMINOPHEN 325 MG PO TABS
650.0000 mg | ORAL_TABLET | Freq: Once | ORAL | Status: AC
  Administered 2016-11-30: 650 mg via ORAL

## 2016-11-30 MED ORDER — OXYCODONE-ACETAMINOPHEN 5-325 MG PO TABS
1.0000 | ORAL_TABLET | Freq: Once | ORAL | Status: DC
  Filled 2016-11-30: qty 1

## 2016-11-30 MED ORDER — ACETAMINOPHEN 325 MG PO TABS
ORAL_TABLET | ORAL | Status: AC
  Filled 2016-11-30: qty 2

## 2016-11-30 NOTE — ED Provider Notes (Signed)
[facility]-EMERGENCY DEPT Provider Note   CSN: [id number] Arrival date & time: 11/30/16  2247     History   Chief Complaint Chief Complaint  Patient presents with  . [id number] Injury    HPI Shirley Terrell is a 31 y.o. female.  The history is provided by the patient and medical records.  Foot Injury     31 year old female with history of anemia, asthma, gallstones, hypertension, presenting to the ED for left foot injury. Patient reports she tripped over a stump causing her to twist her left foot fall to the ground. Reports immediate onset of pain and swelling. Reports she has not been able to ambulate on her left foot since injury. She denies any numbness or weakness of her left foot but does report she is having difficulty moving the foot and wiggling her toes. No prior left foot injuries or surgeries in the past.  Past Medical History:  Diagnosis Date  . Anemia   . Asthma    no problems since 2009  . Bacterial vaginosis   . Gallstones   . Hypertension   . Trichomonas     There are no active problems to display for this patient.   Past Surgical History:  Procedure Laterality Date  . CHOLECYSTECTOMY  04/13/2012   Procedure: LAPAROSCOPIC CHOLECYSTECTOMY WITH INTRAOPERATIVE CHOLANGIOGRAM;  Surgeon: Adolph Pollackodd J Rosenbower, MD;  [facility];  Service: General;  Laterality: N/A;  laparoscopic cholecystectomy with IOC    OB History    Gravida Para Term Preterm AB Living   2 2 2     2    SAB TAB Ectopic Multiple Live Births         0 2       Home Medications    Prior to Admission medications   Medication Sig Start Date End Date Taking? Authorizing Provider  acetaminophen (TYLENOL) 500 MG tablet Take 2 tablets (1,000 mg total) by mouth every 6 (six) hours as needed for fever or headache. 06/18/16   Lesly DukesKelly H Leggett, MD  albuterol (PROVENTIL HFA;VENTOLIN HFA) 108 (90 BASE) MCG/ACT inhaler Inhale 2 puffs into the lungs every 6 (six) hours as needed for wheezing. Reported on 04/11/2016     Historical Provider, MD  ferrous sulfate 325 (65 FE) MG tablet Take 325 mg by mouth daily with breakfast.    Historical Provider, MD  ibuprofen (ADVIL,MOTRIN) 600 MG tablet Take 1 tablet (600 mg total) by mouth every 6 (six) hours. 07/24/16   Leland HerElsia J Yoo, DO  Prenat w/o A Vit-FeFum-FePo-FA (CONCEPT OB) 130-92.4-1 MG CAPS Take 1 tablet by mouth daily. 01/09/16   Dorathy KinsmanVirginia Smith, CNM    Family History Family History  Problem Relation Age of Onset  . Hypertension Mother   . Stroke Mother   . Heart disease Father   . Depression Father   . Cancer Paternal Grandmother     breast  . Diabetes Paternal Grandmother   . Cancer Paternal Grandfather   . Hypertension Maternal Grandmother   . Diabetes Other     Social History Social History  Substance Use Topics  . Smoking status: Current Some Day Smoker    Packs/day: 0.25    Types: Cigarettes  . Smokeless tobacco: Never Used     Comment: pt states 3cig per week  . Alcohol use No     Comment: socially     Allergies   Patient has no known allergies.   Review of Systems Review of Systems  Musculoskeletal: Positive for arthralgias.  All  other systems reviewed and are negative.    Physical Exam Updated Vital Signs BP (!) 175/114 (BP Location: Right Arm)   Pulse 115   Temp 98.3 F (36.8 C) (Oral)   Resp 20   Ht 5\' 3"  (1.6 m)   Wt 77.1 kg   SpO2 99%   Breastfeeding? Yes   BMI 30.11 kg/m   Physical Exam  Constitutional: She is oriented to person, place, and time. She appears well-developed and well-nourished.  HENT:  Head: Normocephalic and atraumatic.  Mouth/Throat: Oropharynx is clear and moist.  Eyes: Conjunctivae and EOM are normal. Pupils are equal, round, and reactive to light.  Neck: Normal range of motion.  Cardiovascular: Normal rate, regular rhythm and normal heart sounds.   Pulmonary/Chest: Effort normal and breath sounds normal. No respiratory distress. She has no wheezes.  Abdominal: Soft. Bowel sounds are  normal. There is no tenderness. There is no rebound.  Musculoskeletal:       Left foot: There is decreased range of motion, tenderness, bony tenderness, swelling and deformity. There is no laceration.       Feet:  Deformity of left dorsal foot, most pronounced over the second metatarsal; pain with any attempted movement of the foot; able to move toes slightly; DP pulse intact; foot is warm, well perfused  Neurological: She is alert and oriented to person, place, and time.  Skin: Skin is warm and dry.  Psychiatric: She has a normal mood and affect.  Nursing note and vitals reviewed.    ED Treatments / Results  Labs (all labs ordered are listed, but only abnormal results are displayed) Labs Reviewed - No data to display  EKG  EKG Interpretation None       Radiology Dg Ankle Complete Left  Result Date: 11/30/2016 CLINICAL DATA:  Pain injury EXAM: LEFT ANKLE COMPLETE - 3+ VIEW COMPARISON:  None. FINDINGS: The ankle mortise is symmetric. No fracture or dislocation at the ankle. There is a partially visualized Lisfranc injury at the foot. There is a fracture fragment between the base of the first and second metatarsals. IMPRESSION: 1. No acute fracture or dislocation at the ankle 2. Partially visualized Lisfranc dislocation injury at the foot. Fracture fragment between the first and second metatarsals. Possible fracture fragments overlying the first TMT joint space. Electronically Signed   By: Jasmine Pang M.D.   On: 11/30/2016 23:32   Dg Foot Complete Left  Result Date: 11/30/2016 CLINICAL DATA:  Larey Seat after tripping over a stump EXAM: LEFT FOOT - COMPLETE 3+ VIEW COMPARISON:  None. FINDINGS: There is lateral dislocation of the first through fifth metatarsals with respect to the tarsal bones, consistent with homolateral Lisfranc injury. Suspect a fracture at the base of the second metacarpal. IMPRESSION: 1. Findings consistent with homolateral Lisfranc dislocation injury 2. Suspect  fracture at the base of the second metatarsal Electronically Signed   By: Jasmine Pang M.D.   On: 11/30/2016 23:30    Procedures Procedures (including critical care time)  Medications Ordered in ED Medications  acetaminophen (TYLENOL) tablet 650 mg (650 mg Oral Given 11/30/16 2306)  acetaminophen (TYLENOL) tablet 650 mg (650 mg Oral Given 12/01/16 0006)     Initial Impression / Assessment and Plan / ED Course  I have reviewed the triage vital signs and the nursing notes.  Pertinent labs & imaging results that were available during my care of the patient were reviewed by me and considered in my medical decision making (see chart for details).  Clinical Course  31 year old female who with left foot injury after tripping over a stump in her yard. She does have a noted deformity on exam but foot is neurovascularly intact. X-rays confirm Lisfranc dislocation. She does have a noted fracture of the second metatarsal and questionable fracture fragments near the first metatarsal. Discussed with on-call orthopedics-- recommend CT of foot, splint and crutches for now. Nonweightbearing.  FU in clinic with Dr. Victorino Dike this week.    CT foot pending.  Splint ordered and will be applied by ortho tech.  Plan to d/c home with pain control.  Patient is breastfeeding--advised if taking the narcotic pain medication she will need to pump and dump.  She will call to schedule her follow-up appt tomorrow morning.  Final Clinical Impressions(s) / ED Diagnoses   Final diagnoses:  Lisfranc dislocation, left, initial encounter  Nondisplaced fracture of second metatarsal bone, left foot, initial encounter for closed fracture    New Prescriptions New Prescriptions   OXYCODONE (ROXICODONE) 5 MG IMMEDIATE RELEASE TABLET    Take 1 tablet (5 mg total) by mouth every 4 (four) hours as needed for severe pain.     Garlon Hatchet, PA-C 12/01/16 1610    Alvira Monday, MD 12/03/16 [phone]

## 2016-11-30 NOTE — ED Triage Notes (Signed)
Onset tonight pt tripped over curb c/o left ankle and foot pain.  Not able to bear any weight on left foot.

## 2016-12-01 ENCOUNTER — Emergency Department (HOSPITAL_COMMUNITY): Payer: Medicaid Other

## 2016-12-01 MED ORDER — ACETAMINOPHEN 325 MG PO TABS
650.0000 mg | ORAL_TABLET | Freq: Once | ORAL | Status: AC
  Administered 2016-12-01: 650 mg via ORAL
  Filled 2016-12-01: qty 2

## 2016-12-01 MED ORDER — OXYCODONE HCL 5 MG PO TABS: 5.0000 mg | ORAL_TABLET | ORAL | 0 refills | Status: DC | PRN

## 2016-12-01 MED ORDER — OXYCODONE-ACETAMINOPHEN 5-325 MG PO TABS: 1.0000 | ORAL_TABLET | ORAL | 0 refills | Status: DC | PRN

## 2016-12-01 NOTE — Progress Notes (Signed)
Orthopedic Tech Progress Note Patient Details:  Anna GenreJessica L Sopp 05/27/1986 [id number]  Ortho Devices Type of Ortho Device: Post (short leg) splint, Crutches Ortho Device/Splint Location: lle Ortho Device/Splint Interventions: Ordered, Application   Trinna PostMartinez, Victorious Cosio J 12/01/2016, 2:06 AM

## 2016-12-01 NOTE — Discharge Instructions (Signed)
Recommend to elevate foot at home to help control swelling. Take oxycodone as needed for severe pain.  If you need to take this, you will need to pump and dump prior to feeding baby.  Otherwise, you can take tylenol. Follow-up with Dr. Victorino DikeHewitt in clinic this week-- call their office tomorrow for appt time. Return to the ED for new or worsening symptoms.

## 2016-12-01 NOTE — ED Notes (Signed)
Pt stable, understands discharge instructions, and reasons for return.   

## 2016-12-08 ENCOUNTER — Other Ambulatory Visit: Payer: Self-pay | Admitting: Orthopedic Surgery

## 2016-12-11 ENCOUNTER — Encounter (HOSPITAL_BASED_OUTPATIENT_CLINIC_OR_DEPARTMENT_OTHER): Payer: Self-pay | Admitting: *Deleted

## 2016-12-18 ENCOUNTER — Encounter (HOSPITAL_BASED_OUTPATIENT_CLINIC_OR_DEPARTMENT_OTHER): Payer: Self-pay | Admitting: Certified Registered"

## 2016-12-18 ENCOUNTER — Ambulatory Visit (HOSPITAL_BASED_OUTPATIENT_CLINIC_OR_DEPARTMENT_OTHER): Payer: Medicaid Other | Admitting: Certified Registered"

## 2016-12-18 ENCOUNTER — Encounter (HOSPITAL_BASED_OUTPATIENT_CLINIC_OR_DEPARTMENT_OTHER)
Admission: RE | Disposition: A | Discharge status: Home / Self Care | Payer: Self-pay | Source: Ambulatory Visit | Attending: Orthopedic Surgery

## 2016-12-18 ENCOUNTER — Ambulatory Visit (HOSPITAL_BASED_OUTPATIENT_CLINIC_OR_DEPARTMENT_OTHER)
Admission: RE | Admit: 2016-12-18 | Discharge: 2016-12-18 | Disposition: A | Discharge status: Home / Self Care | Payer: Medicaid Other | Source: Ambulatory Visit | Attending: Orthopedic Surgery | Admitting: Orthopedic Surgery

## 2016-12-18 DIAGNOSIS — Y9301 Activity, walking, marching and hiking: Secondary | ICD-10-CM | POA: Diagnosis not present

## 2016-12-18 DIAGNOSIS — F17210 Nicotine dependence, cigarettes, uncomplicated: Secondary | ICD-10-CM | POA: Insufficient documentation

## 2016-12-18 DIAGNOSIS — Y92096 Garden or yard of other non-institutional residence as the place of occurrence of the external cause: Secondary | ICD-10-CM | POA: Insufficient documentation

## 2016-12-18 DIAGNOSIS — I10 Essential (primary) hypertension: Secondary | ICD-10-CM | POA: Diagnosis not present

## 2016-12-18 DIAGNOSIS — S93325A Dislocation of tarsometatarsal joint of left foot, initial encounter: Secondary | ICD-10-CM | POA: Diagnosis present

## 2016-12-18 DIAGNOSIS — Y998 Other external cause status: Secondary | ICD-10-CM | POA: Insufficient documentation

## 2016-12-18 DIAGNOSIS — J45909 Unspecified asthma, uncomplicated: Secondary | ICD-10-CM | POA: Insufficient documentation

## 2016-12-18 DIAGNOSIS — X501XXA Overexertion from prolonged static or awkward postures, initial encounter: Secondary | ICD-10-CM | POA: Diagnosis not present

## 2016-12-18 HISTORY — PX: OPEN REDUCTION INTERNAL FIXATION (ORIF) FOOT LISFRANC FRACTURE: SHX5990

## 2016-12-18 HISTORY — DX: Dislocation of tarsometatarsal joint of unspecified foot, initial encounter: S93.326A

## 2016-12-18 LAB — POCT HEMOGLOBIN-HEMACUE: Hemoglobin: 12.4 g/dL (ref 12.0–15.0)

## 2016-12-18 SURGERY — OPEN REDUCTION INTERNAL FIXATION (ORIF) FOOT LISFRANC FRACTURE
Anesthesia: General | Site: Foot | Laterality: Left

## 2016-12-18 MED ORDER — LIDOCAINE HCL (CARDIAC) 20 MG/ML IV SOLN
INTRAVENOUS | Status: DC | PRN
  Administered 2016-12-18: 30 mg via INTRAVENOUS

## 2016-12-18 MED ORDER — HYDROMORPHONE HCL 1 MG/ML IJ SOLN
0.2500 mg | INTRAMUSCULAR | Status: DC | PRN
  Administered 2016-12-18 (×2): 0.5 mg via INTRAVENOUS

## 2016-12-18 MED ORDER — CEFAZOLIN SODIUM-DEXTROSE 2-4 GM/100ML-% IV SOLN
2.0000 g | INTRAVENOUS | Status: AC
  Administered 2016-12-18: 2 g via INTRAVENOUS

## 2016-12-18 MED ORDER — FENTANYL CITRATE (PF) 100 MCG/2ML IJ SOLN
INTRAMUSCULAR | Status: AC
  Filled 2016-12-18: qty 2

## 2016-12-18 MED ORDER — DEXAMETHASONE SODIUM PHOSPHATE 10 MG/ML IJ SOLN
INTRAMUSCULAR | Status: DC | PRN
  Administered 2016-12-18: 10 mg via INTRAVENOUS

## 2016-12-18 MED ORDER — OXYCODONE HCL 5 MG PO TABS
ORAL_TABLET | ORAL | Status: AC
  Filled 2016-12-18: qty 1

## 2016-12-18 MED ORDER — BUPIVACAINE-EPINEPHRINE 0.5% -1:200000 IJ SOLN
INTRAMUSCULAR | Status: DC | PRN
  Administered 2016-12-18: 15 mL

## 2016-12-18 MED ORDER — MEPERIDINE HCL 25 MG/ML IJ SOLN: 6.2500 mg | INTRAMUSCULAR | Status: DC | PRN

## 2016-12-18 MED ORDER — CHLORHEXIDINE GLUCONATE 4 % EX LIQD: 60.0000 mL | Freq: Once | CUTANEOUS | Status: DC

## 2016-12-18 MED ORDER — PROPOFOL 10 MG/ML IV BOLUS
INTRAVENOUS | Status: DC | PRN
  Administered 2016-12-18: 150 mg via INTRAVENOUS

## 2016-12-18 MED ORDER — 0.9 % SODIUM CHLORIDE (POUR BTL) OPTIME
TOPICAL | Status: DC | PRN
  Administered 2016-12-18: 200 mL

## 2016-12-18 MED ORDER — OXYCODONE HCL 5 MG PO TABS
5.0000 mg | ORAL_TABLET | Freq: Once | ORAL | Status: AC
  Administered 2016-12-18: 5 mg via ORAL

## 2016-12-18 MED ORDER — SODIUM CHLORIDE 0.9 % IV SOLN: INTRAVENOUS | Status: DC

## 2016-12-18 MED ORDER — LABETALOL HCL 5 MG/ML IV SOLN
5.0000 mg | Freq: Once | INTRAVENOUS | Status: AC
  Administered 2016-12-18: 5 mg via INTRAVENOUS

## 2016-12-18 MED ORDER — FENTANYL CITRATE (PF) 100 MCG/2ML IJ SOLN
50.0000 ug | INTRAMUSCULAR | Status: AC | PRN
  Administered 2016-12-18: 50 ug via INTRAVENOUS
  Administered 2016-12-18: 100 ug via INTRAVENOUS
  Administered 2016-12-18: 50 ug via INTRAVENOUS
  Administered 2016-12-18 (×2): 25 ug via INTRAVENOUS
  Administered 2016-12-18 (×2): 50 ug via INTRAVENOUS

## 2016-12-18 MED ORDER — LACTATED RINGERS IV SOLN
INTRAVENOUS | Status: DC
  Administered 2016-12-18: 12:00:00 via INTRAVENOUS

## 2016-12-18 MED ORDER — OXYCODONE HCL 5 MG PO TABS: 5.0000 mg | ORAL_TABLET | ORAL | 0 refills | Status: DC | PRN

## 2016-12-18 MED ORDER — MIDAZOLAM HCL 2 MG/2ML IJ SOLN
1.0000 mg | INTRAMUSCULAR | Status: DC | PRN
  Administered 2016-12-18: 2 mg via INTRAVENOUS

## 2016-12-18 MED ORDER — LABETALOL HCL 5 MG/ML IV SOLN
INTRAVENOUS | Status: AC
  Filled 2016-12-18: qty 4

## 2016-12-18 MED ORDER — ONDANSETRON HCL 4 MG/2ML IJ SOLN: 4.0000 mg | Freq: Once | INTRAMUSCULAR | Status: DC | PRN

## 2016-12-18 MED ORDER — HYDROMORPHONE HCL 1 MG/ML IJ SOLN
INTRAMUSCULAR | Status: AC
  Filled 2016-12-18: qty 1

## 2016-12-18 MED ORDER — CEFAZOLIN SODIUM-DEXTROSE 2-4 GM/100ML-% IV SOLN
INTRAVENOUS | Status: AC
  Filled 2016-12-18: qty 100

## 2016-12-18 MED ORDER — MIDAZOLAM HCL 2 MG/2ML IJ SOLN
INTRAMUSCULAR | Status: AC
  Filled 2016-12-18: qty 2

## 2016-12-18 MED ORDER — SCOPOLAMINE 1 MG/3DAYS TD PT72
1.0000 | MEDICATED_PATCH | Freq: Once | TRANSDERMAL | Status: DC | PRN
Start: 2016-12-18 — End: 2016-12-18

## 2016-12-18 SURGICAL SUPPLY — 87 items
BANDAGE ESMARK 6X9 LF (GAUZE/BANDAGES/DRESSINGS) ×1 IMPLANT
BIT DRILL 2 FAST STEP (BIT) ×2 IMPLANT
BIT DRILL 2.5X2.75 QC CALB (BIT) ×2 IMPLANT
BIT DRILL 2.9 CANN QC NONSTRL (BIT) ×2 IMPLANT
BLADE MICRO SAGITTAL (BLADE) IMPLANT
BLADE SURG 15 STRL LF DISP TIS (BLADE) ×2 IMPLANT
BLADE SURG 15 STRL SS (BLADE) ×6
BNDG CMPR 9X6 STRL LF SNTH (GAUZE/BANDAGES/DRESSINGS) ×2
BNDG COHESIVE 4X5 TAN STRL (GAUZE/BANDAGES/DRESSINGS) ×3 IMPLANT
BNDG COHESIVE 6X5 TAN STRL LF (GAUZE/BANDAGES/DRESSINGS) ×3 IMPLANT
BNDG ESMARK 6X9 LF (GAUZE/BANDAGES/DRESSINGS) ×6
CAP PIN ORTHO PINK (CAP) ×2 IMPLANT
CAP PIN PROTECTOR ORTHO WHT (CAP) IMPLANT
CHLORAPREP W/TINT 26ML (MISCELLANEOUS) ×3 IMPLANT
COVER BACK TABLE 60X90IN (DRAPES) ×3 IMPLANT
CUFF TOURNIQUET SINGLE 34IN LL (TOURNIQUET CUFF) ×2 IMPLANT
DECANTER SPIKE VIAL GLASS SM (MISCELLANEOUS) IMPLANT
DRAPE EXTREMITY T 121X128X90 (DRAPE) ×3 IMPLANT
DRAPE OEC MINIVIEW 54X84 (DRAPES) ×3 IMPLANT
DRAPE U-SHAPE 47X51 STRL (DRAPES) ×3 IMPLANT
DRSG MEPITEL 4X7.2 (GAUZE/BANDAGES/DRESSINGS) ×3 IMPLANT
DRSG PAD ABDOMINAL 8X10 ST (GAUZE/BANDAGES/DRESSINGS) ×3 IMPLANT
ELECT REM PT RETURN 9FT ADLT (ELECTROSURGICAL) ×3
ELECTRODE REM PT RTRN 9FT ADLT (ELECTROSURGICAL) ×1 IMPLANT
GAUZE SPONGE 4X4 12PLY STRL (GAUZE/BANDAGES/DRESSINGS) ×3 IMPLANT
GLOVE BIO SURGEON STRL SZ8 (GLOVE) ×3 IMPLANT
GLOVE BIOGEL PI IND STRL 7.0 (GLOVE) IMPLANT
GLOVE BIOGEL PI IND STRL 8 (GLOVE) ×2 IMPLANT
GLOVE BIOGEL PI INDICATOR 7.0 (GLOVE) ×4
GLOVE BIOGEL PI INDICATOR 8 (GLOVE) ×4
GLOVE ECLIPSE 6.5 STRL STRAW (GLOVE) ×2 IMPLANT
GLOVE ECLIPSE 7.5 STRL STRAW (GLOVE) ×1 IMPLANT
GLOVE SURG SS PI 7.0 STRL IVOR (GLOVE) ×2 IMPLANT
GOWN STRL REUS W/ TWL LRG LVL3 (GOWN DISPOSABLE) ×1 IMPLANT
GOWN STRL REUS W/ TWL XL LVL3 (GOWN DISPOSABLE) ×2 IMPLANT
GOWN STRL REUS W/TWL LRG LVL3 (GOWN DISPOSABLE) ×3
GOWN STRL REUS W/TWL XL LVL3 (GOWN DISPOSABLE) ×6
K-WIRE .054X4 (WIRE) IMPLANT
K-WIRE ACE 1.6X6 (WIRE) ×9
KWIRE ACE 1.6X6 (WIRE) IMPLANT
NDL HYPO 25X1 1.5 SAFETY (NEEDLE) IMPLANT
NEEDLE HYPO 22GX1.5 SAFETY (NEEDLE) IMPLANT
NEEDLE HYPO 25X1 1.5 SAFETY (NEEDLE) ×3 IMPLANT
PACK BASIN DAY SURGERY FS (CUSTOM PROCEDURE TRAY) ×3 IMPLANT
PAD CAST 4YDX4 CTTN HI CHSV (CAST SUPPLIES) ×1 IMPLANT
PADDING CAST ABS 4INX4YD NS (CAST SUPPLIES)
PADDING CAST ABS COTTON 4X4 ST (CAST SUPPLIES) IMPLANT
PADDING CAST COTTON 4X4 STRL (CAST SUPPLIES) ×3
PADDING CAST COTTON 6X4 STRL (CAST SUPPLIES) ×3 IMPLANT
PENCIL BUTTON HOLSTER BLD 10FT (ELECTRODE) ×3 IMPLANT
PLATE 2.5 SM F/IN-LINE FUSION (Plate) ×2 IMPLANT
PLATE LOCK LG 3.5 (Plate) ×4 IMPLANT
SANITIZER HAND PURELL 535ML FO (MISCELLANEOUS) ×3 IMPLANT
SCREW 3.5MM CORT LP 34MM (Screw) ×2 IMPLANT
SCREW CORT 3.5X32 (Screw) ×6 IMPLANT
SCREW CORT T15 24X3.5XST LCK (Screw) IMPLANT
SCREW CORT T15 28X3.5XST LCK (Screw) IMPLANT
SCREW CORT T15 32X3.5XST LCK (Screw) IMPLANT
SCREW CORTICAL 3.5MM  28MM (Screw) ×2 IMPLANT
SCREW CORTICAL 3.5MM 28MM (Screw) IMPLANT
SCREW CORTICAL 3.5MM 36MM (Screw) ×2 IMPLANT
SCREW CORTICAL 3.5X24MM (Screw) ×6 IMPLANT
SCREW CORTICAL 3.5X28MM (Screw) ×6 IMPLANT
SCREW LOW PROFILE 22MMX3.5MM (Screw) ×2 IMPLANT
SCREW PEG 2.5X20 NONLOCK (Screw) ×2 IMPLANT
SCREW PEG 2.5X22 NONLOCK (Screw) ×4 IMPLANT
SCREW PEG 2.5X24 NONLOCK (Screw) ×2 IMPLANT
SHEET MEDIUM DRAPE 40X70 STRL (DRAPES) ×5 IMPLANT
SLEEVE SCD COMPRESS KNEE MED (MISCELLANEOUS) ×3 IMPLANT
SPLINT FAST PLASTER 5X30 (CAST SUPPLIES) ×40
SPLINT PLASTER CAST FAST 5X30 (CAST SUPPLIES) ×20 IMPLANT
SPONGE LAP 18X18 X RAY DECT (DISPOSABLE) ×3 IMPLANT
STOCKINETTE 6  STRL (DRAPES) ×2
STOCKINETTE 6 STRL (DRAPES) ×1 IMPLANT
SUCTION FRAZIER HANDLE 10FR (MISCELLANEOUS) ×2
SUCTION TUBE FRAZIER 10FR DISP (MISCELLANEOUS) ×1 IMPLANT
SUT ETHILON 3 0 PS 1 (SUTURE) ×5 IMPLANT
SUT MNCRL AB 3-0 PS2 18 (SUTURE) ×3 IMPLANT
SUT VIC AB 0 SH 27 (SUTURE) IMPLANT
SUT VIC AB 2-0 SH 27 (SUTURE) ×3
SUT VIC AB 2-0 SH 27XBRD (SUTURE) IMPLANT
SYR BULB 3OZ (MISCELLANEOUS) ×3 IMPLANT
SYR CONTROL 10ML LL (SYRINGE) ×2 IMPLANT
TOWEL OR 17X24 6PK STRL BLUE (TOWEL DISPOSABLE) ×3 IMPLANT
TUBE CONNECTING 20'X1/4 (TUBING) ×1
TUBE CONNECTING 20X1/4 (TUBING) ×2 IMPLANT
UNDERPAD 30X30 (UNDERPADS AND DIAPERS) ×3 IMPLANT

## 2016-12-18 NOTE — Brief Op Note (Signed)
12/18/2016  1:59 PM  PATIENT:  Shirley Terrell  31 y.o. female  PRE-OPERATIVE DIAGNOSIS:  Left foot Lisfranc fracture dislocation   POST-OPERATIVE DIAGNOSIS:  Left foot lisfranc fracture dislocation (1-5 TMT joints)  Procedure(s): 1.  Open treatment of left foot 1st through 5th tarsometatarsal joint dislocations with internal fixation 2.  AP, mortise and lateral xrays of the left foot  SURGEON:  Toni ArthursJohn Dat Derksen, MD  ASSISTANT: n/a  ANESTHESIA:   General, regional  EBL:  minimal   TOURNIQUET:   Total Tourniquet Time Documented: Thigh (Left) - 10 minutes Total: Thigh (Left) - 10 minutes  COMPLICATIONS:  None apparent  DISPOSITION:  Extubated, awake and stable to recovery.  DICTATION ID:   [id number]

## 2016-12-18 NOTE — Anesthesia Procedure Notes (Signed)
Procedure Name: LMA Insertion Date/Time: 12/18/2016 12:24 PM Performed by: Dervin Vore D Pre-anesthesia Checklist: Patient identified, Emergency Drugs available, Suction available and Patient being monitored Patient Re-evaluated:Patient Re-evaluated prior to inductionOxygen Delivery Method: Circle system utilized Preoxygenation: Pre-oxygenation with 100% oxygen Intubation Type: IV induction Ventilation: Mask ventilation without difficulty LMA: LMA inserted LMA Size: 4.0 Number of attempts: 1 Airway Equipment and Method: Bite block Placement Confirmation: positive ETCO2 Tube secured with: Tape Dental Injury: Teeth and Oropharynx as per pre-operative assessment

## 2016-12-18 NOTE — Discharge Instructions (Addendum)
Post Anesthesia Home Care Instructions  Activity: Get plenty of rest for the remainder of the day. A responsible adult should stay with you for 24 hours following the procedure.  For the next 24 hours, DO NOT: -Drive a car -Advertising copywriterperate machinery -Drink alcoholic beverages -Take any medication unless instructed by your physician -Make any legal decisions or sign important papers.  Meals: Start with liquid foods such as gelatin or soup. Progress to regular foods as tolerated. Avoid greasy, spicy, heavy foods. If nausea and/or vomiting occur, drink only clear liquids until the nausea and/or vomiting subsides. Call your physician if vomiting continues.  Special Instructions/Symptoms: Your throat may feel dry or sore from the anesthesia or the breathing tube placed in your throat during surgery. If this causes discomfort, gargle with warm salt water. The discomfort should disappear within 24 hours.  If you had a scopolamine patch placed behind your ear for the management of post- operative nausea and/or vomiting:  1. The medication in the patch is effective for 72 hours, after which it should be removed.  Wrap patch in a tissue and discard in the trash. Wash hands thoroughly with soap and water. 2. You may remove the patch earlier than 72 hours if you experience unpleasant side effects which may include dry mouth, dizziness or visual disturbances. 3. Avoid touching the patch. Wash your hands with soap and water after contact with the patch.     Regional Anesthesia Blocks  1. Numbness or the inability to move the "blocked" extremity may last from 3-48 hours after placement. The length of time depends on the medication injected and your individual response to the medication. If the numbness is not going away after 48 hours, call your surgeon.  2. The extremity that is blocked will need to be protected until the numbness is gone and the  Strength has returned. Because you cannot feel it, you will  need to take extra care to avoid injury. Because it may be weak, you may have difficulty moving it or using it. You may not know what position it is in without looking at it while the block is in effect.  3. For blocks in the legs and feet, returning to weight bearing and walking needs to be done carefully. You will need to wait until the numbness is entirely gone and the strength has returned. You should be able to move your leg and foot normally before you try and bear weight or walk. You will need someone to be with you when you first try to ensure you do not fall and possibly risk injury.  4. Bruising and tenderness at the needle site are common side effects and will resolve in a few days.  5. Persistent numbness or new problems with movement should be communicated to the surgeon or the [facility] [phone])/ [facility] [phone]).  While taking pain medication . Do not use breast milk.    Toni ArthursJohn Hewitt, MD [facility]  Please read the following information regarding your care after surgery.  Medications  You only need a prescription for the narcotic pain medicine (ex. oxycodone, Percocet, Norco).  All of the other medicines listed below are available over the counter. X acetominophen (Tylenol) 650 mg every 4-6 hours as you need for minor pain X oxycodone as prescribed for severe pain  Narcotic pain medicine (ex. oxycodone, Percocet, Vicodin) will cause constipation.  To prevent this problem, take the following medicines while you are taking any pain medicine.  X docusate sodium (Colace) 100 mg twice a day X senna (Senokot) 2 tablets twice a day  X To help prevent blood clots, take a baby aspirin (81 mg) twice a day for two weeks after surgery.  You should also get up every hour while you are awake to move around.    Weight Bearing ? Bear weight when you are able on your operated leg or foot. ? Bear weight only on the heel of your operated  foot in the post-op shoe. X Do not bear any weight on the operated leg or foot.  Cast / Splint / Dressing X Keep your splint or cast clean and dry.  Dont put anything (coat hanger, pencil, etc) down inside of it.  If it gets damp, use a hair dryer on the cool setting to dry it.  If it gets soaked, call the office to schedule an appointment for a cast change. ? Remove your dressing 3 days after surgery and cover the incisions with dry dressings.    After your dressing, cast or splint is removed; you may shower, but do not soak or scrub the wound.  Allow the water to run over it, and then gently pat it dry.  Swelling It is normal for you to have swelling where you had surgery.  To reduce swelling and pain, keep your toes above your nose for at least 3 days after surgery.  It may be necessary to keep your foot or leg elevated for several weeks.  If it hurts, it should be elevated.  Follow Up Call my office at [phone] when you are discharged from the hospital or surgery center to schedule an appointment to be seen two weeks after surgery.  Call my office at [phone] if you develop a fever >101.5 F, nausea, vomiting, bleeding from the surgical site or severe pain.

## 2016-12-18 NOTE — Anesthesia Preprocedure Evaluation (Signed)
Anesthesia Evaluation  Patient identified by MRN, date of birth, ID band Patient awake    Reviewed: Allergy & Precautions, NPO status , Patient's Chart, lab work & pertinent test results  Airway Mallampati: I  TM Distance: >3 FB Neck ROM: Full    Dental   Pulmonary Current Smoker,    Pulmonary exam normal        Cardiovascular hypertension, Pt. on medications Normal cardiovascular exam     Neuro/Psych    GI/Hepatic   Endo/Other    Renal/GU      Musculoskeletal   Abdominal   Peds  Hematology   Anesthesia Other Findings   Reproductive/Obstetrics                             Anesthesia Physical Anesthesia Plan  ASA: II  Anesthesia Plan: General   Post-op Pain Management:  Regional for Post-op pain   Induction: Intravenous  Airway Management Planned: LMA  Additional Equipment:   Intra-op Plan:   Post-operative Plan: Extubation in OR  Informed Consent: I have reviewed the patients History and Physical, chart, labs and discussed the procedure including the risks, benefits and alternatives for the proposed anesthesia with the patient or authorized representative who has indicated his/her understanding and acceptance.     Plan Discussed with: CRNA and Surgeon  Anesthesia Plan Comments:         Anesthesia Quick Evaluation

## 2016-12-18 NOTE — Anesthesia Postprocedure Evaluation (Signed)
Anesthesia Post Note  Patient: Shirley Terrell  Procedure(s) Performed: Procedure(s) (LRB): OPEN REDUCTION INTERNAL FIXATION (ORIF) FOOT LISFRANC FRACTURE (Left)  Patient location during evaluation: PACU Anesthesia Type: General Pain management: pain level controlled Vital Signs Assessment: post-procedure vital signs reviewed and stable Respiratory status: spontaneous breathing Cardiovascular status: stable Anesthetic complications: no       Last Vitals:  Vitals:   12/18/16 1530 12/18/16 1618  BP: (!) 168/109 (!) 171/114  Pulse: 95   Resp: 20   Temp:  36.9 C    Last Pain:  Vitals:   12/18/16 1618  TempSrc: Oral  PainSc: 3                  Leshae Mcclay

## 2016-12-18 NOTE — H&P (Signed)
Shirley Terrell is an 31 y.o. female.   Chief Complaint:  Left foot injury HPI:  31 y/o female with left foot lisfranc fracture dislocation about 2 weeks ago.  She presents today for operative treatment of the injury.  Past Medical History:  Diagnosis Date  . Anemia    takes iron supplement  . Asthma    prn inhaler  . Hypertension    states BP normalized while pregnant and since delivery; is currently not on med.  . Lisfranc dislocation 11/30/2016   left    Past Surgical History:  Procedure Laterality Date  . CHOLECYSTECTOMY  04/13/2012   Procedure: LAPAROSCOPIC CHOLECYSTECTOMY WITH INTRAOPERATIVE CHOLANGIOGRAM;  Surgeon: Adolph Pollackodd J Rosenbower, MD;  [facility];  Service: General;  Laterality: N/A;  laparoscopic cholecystectomy with IOC    Family History  Problem Relation Age of Onset  . Hypertension Mother   . Stroke Mother   . Heart disease Father   . Depression Father   . Cancer Paternal Grandmother     breast  . Diabetes Paternal Grandmother   . Cancer Paternal Grandfather   . Hypertension Maternal Grandmother   . Diabetes Other    Social History:  reports that she has been smoking Cigarettes.  She has been smoking about 0.00 packs per day for the past 3.00 years. She has never used smokeless tobacco. She reports that she does not drink alcohol or use drugs.  Allergies: No Known Allergies  Medications Prior to Admission  Medication Sig Dispense Refill  . acetaminophen (TYLENOL) 500 MG tablet Take 2 tablets (1,000 mg total) by mouth every 6 (six) hours as needed for fever or headache. 30 tablet 0  . ferrous sulfate 325 (65 FE) MG tablet Take 325 mg by mouth daily with breakfast.    . Prenat w/o A Vit-FeFum-FePo-FA (CONCEPT OB) 130-92.4-1 MG CAPS Take 1 tablet by mouth daily. 30 capsule 12  . albuterol (PROVENTIL HFA;VENTOLIN HFA) 108 (90 BASE) MCG/ACT inhaler Inhale 2 puffs into the lungs every 6 (six) hours as needed for wheezing. Reported on 04/11/2016      No  results found for this or any previous visit (from the past 48 hour(s)). No results found.  ROS  No recent f/c/n/v/wt loss  Blood pressure (!) 136/100, temperature 98 F (36.7 C), temperature source Oral, resp. rate 18, height 5\' 3"  (1.6 m), weight 91.2 kg (201 lb), SpO2 99 %, currently breastfeeding. Physical Exam  wn wd female in nad.  A and O x 4. Mood and affect normal.  EOMI.  resp unlabored.  L foot with healthy skin. No lymphadenopathy.  5/5 sterngth in PF and DF of the toes.  Sens to LT intact at the foot.  Assessment/Plan L foot lisfranc fracture / dislocation.  To OR for ORIF of left 1-5 TMT joints.  The risks and benefits of the alternative treatment options have been discussed in detail.  The patient wishes to proceed with surgery and specifically understands risks of bleeding, infection, nerve damage, blood clots, need for additional surgery, amputation and death.   Shirley ArthursHEWITT, Shirley Gracia, MD 12/18/2016, 11:52 AM

## 2016-12-18 NOTE — Transfer of Care (Signed)
Immediate Anesthesia Transfer of Care Note  Patient: Shirley GenreJessica L Reist  Procedure(s) Performed: Procedure(s): OPEN REDUCTION INTERNAL FIXATION (ORIF) FOOT LISFRANC FRACTURE (Left)  Patient Location: PACU  Anesthesia Type:General  Level of Consciousness: awake  Airway & Oxygen Therapy: Patient Spontanous Breathing and Patient connected to face mask oxygen  Post-op Assessment: Report given to RN and Post -op Vital signs reviewed and stable  Post vital signs: Reviewed and stable  Last Vitals:  Vitals:   12/18/16 1213 12/18/16 1403  BP:  (!) 177/120  Pulse: 87 (!) 118  Resp: 19 (!) 26  Temp:      Last Pain:  Vitals:   12/18/16 1133  TempSrc: Oral  PainSc: 6       Patients Stated Pain Goal: 6 (12/18/16 1133)  Complications: No apparent anesthesia complications

## 2016-12-18 NOTE — Progress Notes (Signed)
Assisted Dr. Ossey with left, ultrasound guided, popliteal/saphenous block. Side rails up, monitors on throughout procedure. See vital signs in flow sheet. Tolerated Procedure well. 

## 2016-12-19 ENCOUNTER — Encounter (HOSPITAL_BASED_OUTPATIENT_CLINIC_OR_DEPARTMENT_OTHER): Payer: Self-pay | Admitting: Orthopedic Surgery

## 2016-12-19 NOTE — Op Note (Signed)
NAMCaryn Terrell:  [place], [place]              ACCOUNT NO.:  [id number]  MEDICAL RECORD NO.:  [id number]  LOCATION:                                 FACILITY:  PHYSICIAN:  Toni ArthursJohn Analysia Dungee, MD        DATE OF BIRTH:  [birth year]  DATE OF PROCEDURE:  12/18/2016 DATE OF DISCHARGE:  12/18/2016                              OPERATIVE REPORT   PREOPERATIVE DIAGNOSIS:  Left foot Lisfranc fracture dislocation.  POSTOPERATIVE DIAGNOSES:  Left foot Lisfranc fracture dislocation involving 1st through 5th tarsometatarsal joints.  PROCEDURES: 1. Open treatment of left foot 1st through 5th tarsometatarsal joint     dislocations with internal fixation. 2. AP, mortise, and lateral radiographs of the left foot.  SURGEON:  Toni ArthursJohn Stella Bortle, MD.  ANESTHESIA:  General, regional.  ESTIMATED BLOOD LOSS:  Minimal.  TOURNIQUET TIME:  10 minutes at 250 mmHg.  COMPLICATIONS:  None apparent.  DISPOSITION:  Extubated awake and stable to recovery.  INDICATIONS FOR PROCEDURE:  The patient is a 31 year old woman who injured her left foot approximately 2 weeks ago.  She twisted her foot walking in her yard.  She was in the emergency department, where radiographs showed a left foot Lisfranc fracture dislocation.  She presents now for operative treatment of this displaced and unstable injury.  She understands the risks and benefits of the alternative treatment options and elects surgical treatment.  She specifically understands risks of bleeding, infection, nerve damage, blood clots, need for additional surgery, continued pain, amputation, posttraumatic arthritis, and death.  PROCEDURE IN DETAIL:  After preoperative consent was obtained and the correct operative site was identified, the patient was brought to the operating room and placed on the operating table.  General anesthesia was induced.  Preoperative antibiotics were administered.  Surgical time- out was taken.  Left lower extremity was prepped and draped in  standard sterile fashion.  The extremity was exsanguinated and tourniquet was inflated to 250 mmHg.  A longitudinal incision was made over the dorsum of the midfoot.  Sharp dissection was carried down through the skin and subcutaneous tissue.  The dislocation of the first and second tarsometatarsal joints was identified.  The patient was noted at this time to bleeding despite use of the tourniquet.  The tourniquet was deflated after approximately 10 minutes.  Hemostasis was achieved.  The first and second TMT joints were opened and cleared of all fibrous tissue and hematoma.  The Lisfranc joint complex was also cleared of all soft tissue.  A 2nd incision was made parallel to the 1st over the 3rd and 4th tarsometatarsal joints.  Dissection was carried down through skin and subcutaneous tissue.  The extensor digitorum brevis muscle was split in line with its fibers and the 3rd tarsometatarsal joint was identified. It was mobilized and reduced.  This allowed the first and second TMT joints to reduce appropriately.  The first TMT joint was then provisionally pinned.  AP and lateral radiographs confirmed appropriate reduction of the first TMT joint.  A Weber tenaculum was then placed from the medial cuneiform to the base of the second metatarsal.  This was tightened and was noted to reduce the first and second TMT joints as  well as the Lisfranc joint complex appropriately.  A guide pin was then placed from the medial cuneiform to the base of the second metatarsal. AP, oblique, and lateral radiographs confirmed appropriate position of the guide pin.  Guide pin was then over drilled and a 3.5 mm fully- threaded screw from the [employer] small frag set was inserted.  It was noted to have excellent purchase.  A second screw was then placed in the same fashion from the medial cuneiform to the middle cuneiform.  A 4 hole in-line fusion plate was selected from the ALPS foot extension set. This was  contoured to fit the dorsum of the first TMT joint.  It was secured proximally and distally with 2 fully-threaded bicortical screws. A 2nd 4-hole plate was contoured to fit the dorsum of the second TMT joint and was also secured with 2 bicortical screws proximal and distal to the second TMT joint.  Attention was then returned to the lateral incision.  The 3rd TMT joint was inspected, was noted to still be unstable.  A 2.5 mm 4-hole in-line plate was selected.  It was applied over the dorsal aspect of the joint. It was fixed proximally and distally with 2 bicortical screws on each side of the joint.  AP, oblique, and lateral radiographs showed appropriate position and length of the hardware and appropriate reduction of the 1st 2nd and 3rd tarsometatarsal joints.  The wounds were irrigated.  Subcutaneous tissues were approximated with inverted simple sutures of 3-0 Monocryl.  Running 3-0 nylon sutures were used to close the 3 skin incisions.  Radiographs again confirmed appropriate reduction of the 4th and 5th TMT joints. 1.6 mm K-wires were inserted percutaneously across the fourth and 5th TMT joint securing appropriately.  Both pins were then bent, trimmed, and capped.  Sterile dressings were applied followed by a compression wrap and a well-padded short-leg splint.  The patient was awakened by anesthesia and transported to the recovery room in stable condition.  FOLLOWUP PLAN:  The patient will be nonweightbearing on the left lower extremity.  She will follow up with me in the office in 2 weeks for suture removal and conversion to a short-leg cast.  RADIOGRAPHS:  AP, lateral, and oblique radiographs of the left foot were obtained intraoperatively.  These show interval reduction and fixation of the 1st, 2nd, 3rd, 4th, and 5th tarsometatarsal joints.  Hardware is appropriately positioned of the appropriate lengths.     Toni Arthurs, MD   ______________________________ Toni Arthurs, MD    JH/MEDQ  D:  12/18/2016  T:  12/19/2016  Job:  [id number]

## 2016-12-19 NOTE — Op Note (Deleted)
  The note originally documented on this encounter has been moved the the encounter in which it belongs.  

## 2017-04-15 ENCOUNTER — Ambulatory Visit: Payer: Medicaid Other | Attending: Orthopedic Surgery | Admitting: Rehabilitative and Restorative Service Providers"

## 2017-04-15 DIAGNOSIS — M6281 Muscle weakness (generalized): Secondary | ICD-10-CM

## 2017-04-15 DIAGNOSIS — R262 Difficulty in walking, not elsewhere classified: Secondary | ICD-10-CM | POA: Diagnosis present

## 2017-04-15 NOTE — Therapy (Signed)
[facility] [address] [place], [place], [address] Phone: [phone]   Fax:  [phone]  Physical Therapy Evaluation  Patient Details  Name: Shirley Terrell MRN: [id number] Date of Birth: 04/27/1986 Referring Provider: Veleta Minersllis  Encounter Date: 04/15/2017      PT End of Session - 04/15/17 1328    Visit Number 2   Number of Visits 12   Date for PT Re-Evaluation 05/27/17   PT Start Time 0940   PT Stop Time 1029   PT Time Calculation (min) 49 min   Activity Tolerance Patient tolerated treatment well;No increased pain   Behavior During Therapy WFL for tasks assessed/performed      Past Medical History:  Diagnosis Date  . Anemia    takes iron supplement  . Asthma    prn inhaler  . Hypertension    states BP normalized while pregnant and since delivery; is currently not on med.  . Lisfranc dislocation 11/30/2016   left    Past Surgical History:  Procedure Laterality Date  . CHOLECYSTECTOMY  04/13/2012   Procedure: LAPAROSCOPIC CHOLECYSTECTOMY WITH INTRAOPERATIVE CHOLANGIOGRAM;  Surgeon: Adolph Pollackodd J Rosenbower, MD;  [facility];  Service: General;  Laterality: N/A;  laparoscopic cholecystectomy with IOC  . OPEN REDUCTION INTERNAL FIXATION (ORIF) FOOT LISFRANC FRACTURE Left 12/18/2016   Procedure: OPEN REDUCTION INTERNAL FIXATION (ORIF) FOOT LISFRANC FRACTURE;  Surgeon: Toni ArthursJohn Hewitt, MD;  Location: [facility];  Service: Orthopedics;  Laterality: Left;    There were no vitals filed for this visit.       Subjective Assessment - 04/15/17 0942    Subjective Cant put any pressure on toes. MD gave me exercises to do of ankle pumps, wiggling toes, and to increase amb on foot 1 hour each day (currently up to 8 hours/day). Dr. said i have good motion but i can't physically  lift my foot up by myself.   Pertinent History Lisfranc fracture L foot wth surgery 12/18/16. 14 screws and 3 plates. incision on forefoot. still  swells. has crutches but uses periodically.    Limitations Standing;Walking;House hold activities   How long can you sit comfortably? undetermined amount of time   How long can you stand comfortably? </= 45 min   How long can you walk comfortably? 15 min   Diagnostic tests see chart   Patient Stated Goals to be able to move my foot   Currently in Pain? Yes   Pain Score 6    Pain Location Foot   Pain Orientation Left   Pain Descriptors / Indicators Constant;Tightness;Heaviness   Pain Type Chronic pain;Surgical pain   Pain Radiating Towards ankle/foot area   Pain Onset More than a month ago   Pain Frequency Intermittent   Aggravating Factors  weightbearing   Pain Relieving Factors rest   Effect of Pain on Daily Activities pt has to take many seated rest breaks, leg gets tried   Multiple Pain Sites No            [facility] PT Assessment - 04/15/17 0001      Assessment   Medical Diagnosis L Lisfranc dislocation with ORIF surgery   Referring Provider Ollis   Onset Date/Surgical Date --  12/18/16   Hand Dominance Right   Next MD Visit 1 month   Prior Therapy none     Precautions   Precautions None     Restrictions   Weight Bearing Restrictions No     Balance Screen   Has  the patient fallen in the past 6 months Yes   How many times? 2   Has the patient had a decrease in activity level because of a fear of falling?  Yes   Is the patient reluctant to leave their home because of a fear of falling?  Yes  encouraged pt to use crutches; lives house with someone  now     Cabin crew Private residence   Type of Home --  upstairs apt     Prior Function   Level of Independence Independent   Vocation Unemployed     Cognition   Overall Cognitive Status Within Functional Limits for tasks assessed     Observation/Other Assessments-Edema    Edema --  no edema noted at eval but pt reports swelling incr all day     Coordination   Gross Motor Movements are  Fluid and Coordinated No     ROM / Strength   AROM / PROM / Strength AROM;Strength     AROM   Overall AROM Comments L AROM supine -29 degrees DF, 2 degrees PF, inversion/eversion 4 degrees     Strength   Overall Strength Comments DF/PF 2-/5, inversion/eversion trace     Palpation   Palpation comment L ankle PROM WNL and even to contralateral side     Ambulation/Gait   Gait Comments pt amb with decreased L stance, no heel strike, minimal PF            Objective measurements completed on examination: See above findings.                  PT Education - 04/15/17 1044    Education provided Yes   Education Details HEP issued: towel crunches x 20, towel inversion/eversion x 20; ankle pumps x 20, ankle circles CW/CCW x 20 each; ankle alphabet 1 set, seated ankle inversion/eversion x 10. all 2x/day; discussed with pt performing heel strike and toe off as tolerated and using one crutch to decrease falls risk due to insteadiness. pt agreeable to all.   Person(s) Educated Patient   Methods Explanation;Demonstration;Tactile cues;Verbal cues;Handout   Comprehension Verbalized understanding;Returned demonstration;Verbal cues required          PT Short Term Goals - 04/15/17 1051      PT SHORT TERM GOAL #1   Title Pt will be I with initial HEP to assist with gait training   Baseline issued at eval   Time 3   Period Weeks   Status New     PT SHORT TERM GOAL #2   Title Pt will demo improved AROM DF to lacking </= -15 degrees to assist with more normalized gait (measured supine)   Baseline DF -29 degrees   Time 3   Period Weeks   Status New     PT SHORT TERM GOAL #3   Title Pt will demo AROM PF to 15 degrees to assist with decreased risk of falls   Baseline 2 degrees   Time 3   Period Weeks   Status New           PT Long Term Goals - 04/15/17 1052      PT LONG TERM GOAL #1   Title Pt will demo improved DF/PF strength to 3+/5 to assist with gait around  community   Baseline 2-/5 DF/PF   Time 6   Period Weeks   Status New     PT LONG TERM GOAL #2   Title Pt  will demo improved inversion/eversion strength to 2/5 to assist with decreased falls risk   Baseline trace   Time 6   Period Weeks   Status New     PT LONG TERM GOAL #3   Title Pt will be I with advanced HEP to assist with improved strength for normalized gait and mobility   Baseline HEP given at eval   Time 6   Period Weeks   Status New                Plan - 04/15/17 1047    Clinical Impression Statement Pt w/p L foot ORIF 12/18/16 and was casted 2.5-3 months. Pt has full ankle PROM but has minimal AROM all planes. Pt with significant L ankle weakness and demos antalgic gait due to weakness with heel strike and toe off. Pt would benefit from PT for ankle strengthening to assist with improved mobility and decreased falls risk   History and Personal Factors relevant to plan of care: L ORIF 12/18/16 foot; no precautions or restrictions   Clinical Presentation Stable   Clinical Presentation due to: pt presentation and objective measures   Clinical Decision Making Low   Rehab Potential Good   Clinical Impairments Affecting Rehab Potential MCD insurance limitation for visits   PT Frequency 2x / week   PT Duration 6 weeks   PT Treatment/Interventions ADLs/Self Care Home Management;Functional mobility training;Stair training;Gait training;DME Instruction;Therapeutic activities;Therapeutic exercise;Balance training;Neuromuscular re-education;Patient/family education;Taping   PT Next Visit Plan review HEP; gait training; continue strengthening; pt did get financial assistance application   PT Home Exercise Plan see pt instructions   Consulted and Agree with Plan of Care Patient      Patient will benefit from skilled therapeutic intervention in order to improve the following deficits and impairments:  Abnormal gait, Decreased activity tolerance, Decreased balance, Decreased  mobility, Decreased coordination, Decreased range of motion, Decreased strength, Increased edema, Difficulty walking, Pain  Visit Diagnosis: Muscle weakness (generalized) - Plan: PT plan of care cert/re-cert  Difficulty in walking, not elsewhere classified - Plan: PT plan of care cert/re-cert     Problem List There are no active problems to display for this patient.   Thornell Sartorius , PT 04/15/2017, 1:34 PM  [facility] [address] [place], [place], [address] Phone: [phone]   Fax:  [phone]  Name: Shirley Terrell MRN: [id number] Date of Birth: [birth year]

## 2017-04-28 ENCOUNTER — Ambulatory Visit: Payer: Medicaid Other | Attending: Orthopedic Surgery | Admitting: Physical Therapy

## 2017-04-28 ENCOUNTER — Encounter: Payer: Self-pay | Admitting: Physical Therapy

## 2017-04-28 DIAGNOSIS — M6281 Muscle weakness (generalized): Secondary | ICD-10-CM | POA: Diagnosis not present

## 2017-04-28 DIAGNOSIS — R262 Difficulty in walking, not elsewhere classified: Secondary | ICD-10-CM | POA: Diagnosis present

## 2017-04-28 NOTE — Patient Instructions (Signed)
Exercises added from Exercise drawer.  Ankle isometrics all added 5-10 x each 5 seconds Standing ankle strengthening 5-10 X each heel/toe lifts , and SLS 3-5 X each with 40-45 second goal.  These she is to advance to when ready AROM IV/EV 5-10 X daily  Toe stretch 5 x 5 seconds and PF stretch with hands 5 X 5 seconds

## 2017-04-28 NOTE — Therapy (Signed)
[place], [place], [address] Phone: [phone]   Fax:  [phone]  Physical Therapy Treatment  Patient Details  Name: Shirley Terrell MRN: [id number] Date of Birth: 1985/11/27 Referring Provider: Alfredo Martinez  Encounter Date: 04/28/2017      PT End of Session - 04/28/17 1216    Visit Number 3   Number of Visits 12   Date for PT Re-Evaluation 05/27/17   PT Start Time 1102   PT Stop Time 1200   PT Time Calculation (min) 58 min   Activity Tolerance Patient tolerated treatment well   Behavior During Therapy [facility] for tasks assessed/performed      Past Medical History:  Diagnosis Date  . Anemia    takes iron supplement  . Asthma    prn inhaler  . Hypertension    states BP normalized while pregnant and since delivery; is currently not on med.  . Lisfranc dislocation 11/30/2016   left    Past Surgical History:  Procedure Laterality Date  . CHOLECYSTECTOMY  04/13/2012   Procedure: LAPAROSCOPIC CHOLECYSTECTOMY WITH INTRAOPERATIVE CHOLANGIOGRAM;  Surgeon: Odis Hollingshead, MD;  Location: [place];  Service: General;  Laterality: N/A;  laparoscopic cholecystectomy with IOC  . OPEN REDUCTION INTERNAL FIXATION (ORIF) FOOT LISFRANC FRACTURE Left 12/18/2016   Procedure: OPEN REDUCTION INTERNAL FIXATION (ORIF) FOOT LISFRANC FRACTURE;  Surgeon: Wylene Simmer, MD;  Location: [place];  Service: Orthopedics;  Laterality: Left;    There were no vitals filed for this visit.      Subjective Assessment - 04/28/17 1110    Subjective 4-6/10 pain.  Idon't want to push too fast and suffer later because I have 4 childern to take care of.   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 Yes   Pain Score 6    Pain Location Foot   Pain Orientation Left;Lateral;Medial   Pain Descriptors / Indicators Tightness;Heaviness  I feels like it needs to pop   Pain Type Chronic pain;Surgical pain   Pain  Frequency Intermittent   Aggravating Factors  Walking moving from pavement to grass/ gravel   Pain Relieving Factors rest.   Multiple Pain Sites No                         [facility] Adult PT Treatment/Exercise - 04/28/17 0001      Manual Therapy   Manual therapy comments posterior glides fibula, mobilization with movement standing foot on step , A/P glides ankle,  PROM toes,  calcaneal movements with distraction     Ankle Exercises: Stretches   Plantar Fascia Stretch Limitations 3 minutes rolling foot over tennis ball   Gastroc Stretch Limitations HEP, review, strap used in clinic 3 X 30   Other Stretch toe stretch PROM and sitting with heel lift     Ankle Exercises: Seated   Heel Raises 10 reps   Toe Raise 10 reps   Heel Slides 10 reps   Heel Slides Limitations cues to keep foot flat   Other Seated Ankle Exercises IV/EV AROM  HEP small motions,     Ankle Exercises: Supine   Isometrics HEP 2-3 reps practiced     Ankle Exercises: Standing   Other Standing Ankle Exercises Heel lifts, toe lifts and single leg stand.  Issued.  NOT practiced.  patient to advance herself to these when ready  PT Education - 04/28/17 1216    Education provided Yes   Education Details HEP.     Person(s) Educated Patient   Methods Explanation;Demonstration;Tactile cues;Verbal cues;Handout   Comprehension Verbalized understanding;Returned demonstration          PT Short Term Goals - 04/28/17 1220      PT SHORT TERM GOAL #1   Title Pt will be I with initial HEP to assist with gait training   Baseline cues   Time 3   Period Weeks   Status On-going     PT SHORT TERM GOAL #2   Title Pt will demo improved AROM DF to lacking </= -15 degrees to assist with more normalized gait (measured supine)   Baseline -2 DF, PF 42 AROM   Time 3   Period Weeks   Status Achieved     PT SHORT TERM GOAL #3   Title Pt will demo AROM PF to 15 degrees to assist with decreased  risk of falls   Baseline 42   Time 3   Period Weeks   Status Achieved           PT Long Term Goals - 04/15/17 1052      PT LONG TERM GOAL #1   Title Pt will demo improved DF/PF strength to 3+/5 to assist with gait around community   Baseline 2-/5 DF/PF   Time 6   Period Weeks   Status New     PT LONG TERM GOAL #2   Title Pt will demo improved inversion/eversion strength to 2/5 to assist with decreased falls risk   Baseline trace   Time 6   Period Weeks   Status New     PT LONG TERM GOAL #3   Title Pt will be I with advanced HEP to assist with improved strength for normalized gait and mobility   Baseline HEP given at eval   Time 6   Period Weeks   Status New               Plan - 04/28/17 1217    Clinical Impression Statement Due to limited visits progressed exercises and educated on exercises to do when foot is more advanced.  No pain increased with session.  AROM :2 DSF- 42 PF. Less limp noted.  STG#2,3 met.   Clinical Impairments Affecting Rehab Potential MCD insurance limitation for visits   PT Next Visit Plan review HEP; gait training; continue strengthening; pt did get financial assistance application   PT Home Exercise Plan Isometrics,  ROM .stretching,  Ankle strength standing (for later)   Consulted and Agree with Plan of Care Patient      Patient will benefit from skilled therapeutic intervention in order to improve the following deficits and impairments:  Abnormal gait, Decreased activity tolerance, Decreased balance, Decreased mobility, Decreased coordination, Decreased range of motion, Decreased strength, Increased edema, Difficulty walking, Pain  Visit Diagnosis: Muscle weakness (generalized)  Difficulty in walking, not elsewhere classified     Problem List There are no active problems to display for this patient.   Ly,KAREN PTA 04/28/2017, 12:23 PM  [facility] [address] [place], [place], [address] Phone: [phone]   Fax:  [phone]  Name: Shirley Terrell MRN: [id number] Date of Birth: Nov 17, 1986

## 2017-05-06 ENCOUNTER — Ambulatory Visit: Payer: Medicaid Other | Admitting: Physical Therapy

## 2017-05-06 ENCOUNTER — Encounter: Payer: Self-pay | Admitting: Physical Therapy

## 2017-05-06 DIAGNOSIS — M6281 Muscle weakness (generalized): Secondary | ICD-10-CM

## 2017-05-06 DIAGNOSIS — R262 Difficulty in walking, not elsewhere classified: Secondary | ICD-10-CM

## 2017-05-06 NOTE — Patient Instructions (Signed)
Wall slides facing wall double and single leg lifts. 10-20 x each Issued from exercise drawer.

## 2017-05-06 NOTE — Therapy (Signed)
[facility] [address] [place], [place], [address] Phone: [phone]   Fax:  [phone]  Physical Therapy Treatment  Patient Details  Name: Shirley Terrell MRN: [id number] Date of Birth: 05/24/1986 Referring Provider: Veleta Minersllis  Encounter Date: 05/06/2017      PT End of Session - 05/06/17 1313    Visit Number 3   Number of Visits 12   Date for PT Re-Evaluation 05/27/17   PT Start Time 1017   PT Stop Time 1115   PT Time Calculation (min) 58 min   Activity Tolerance Patient tolerated treatment well   Behavior During Therapy [facility] for tasks assessed/performed      Past Medical History:  Diagnosis Date  . Anemia    takes iron supplement  . Asthma    prn inhaler  . Hypertension    states BP normalized while pregnant and since delivery; is currently not on med.  . Lisfranc dislocation 11/30/2016   left    Past Surgical History:  Procedure Laterality Date  . CHOLECYSTECTOMY  04/13/2012   Procedure: LAPAROSCOPIC CHOLECYSTECTOMY WITH INTRAOPERATIVE CHOLANGIOGRAM;  Surgeon: Adolph Pollackodd J Rosenbower, MD;  [facility];  Service: General;  Laterality: N/A;  laparoscopic cholecystectomy with IOC  . OPEN REDUCTION INTERNAL FIXATION (ORIF) FOOT LISFRANC FRACTURE Left 12/18/2016   Procedure: OPEN REDUCTION INTERNAL FIXATION (ORIF) FOOT LISFRANC FRACTURE;  Surgeon: Toni ArthursJohn Hewitt, MD;  Location: [facility];  Service: Orthopedics;  Laterality: Left;    There were no vitals filed for this visit.      Subjective Assessment - 05/06/17 0933    Subjective 4/10.  Doing the exercises.  sometimes it feels a little stronger.  She is able to increase weightbearing . i am doing good with the staire.    Currently in Pain? Yes   Pain Score 4    Pain Location Foot   Pain Orientation Left;Lateral;Medial   Pain Descriptors / Indicators Tightness;Heaviness   Pain Type Chronic pain;Surgical pain   Pain Radiating Towards ankle foot area   Aggravating Factors  uneven surface   Pain Relieving Factors rest   Effect of Pain on Daily Activities extra time on umeven surfaces,  not back to recreational thingd                         [facility] Adult PT Treatment/Exercise - 05/06/17 0001      Knee/Hip Exercises: Standing   Forward Step Up 1 set   Forward Step Up Limitations onto blue square pad      Modalities   Modalities --     Vasopneumatic   Number Minutes Vasopneumatic  15 minutes   Vasopnuematic Location  --  ankle   Vasopneumatic Pressure Low   Vasopneumatic Temperature  32     Manual Therapy   Manual therapy comments retrograde soft tissue work.  able to decrease girth by 1/8th inch     Ankle Exercises: Standing   Heel Raises 10 reps  both, unable single   Other Standing Ankle Exercises weight shifts facing wall double and single leg weight bearing ,  10 x 2 sets 10 x each   Other Standing Ankle Exercises SLS and toe taps with good leg 10 x close SBA     Ankle Exercises: Seated   BAPS 10 reps                PT Education - 05/06/17 1312    Education provided Yes  Education Details HEP   Person(s) Educated Patient   Methods Explanation;Demonstration;Verbal cues;Handout   Comprehension Verbalized understanding;Returned demonstration          PT Short Term Goals - 05/06/17 1316      PT SHORT TERM GOAL #1   Title Pt will be I with initial HEP to assist with gait training   Baseline independent with exercises added so far   Time 3   Period Weeks   Status On-going     PT SHORT TERM GOAL #2   Title Pt will demo improved AROM DF to lacking </= -15 degrees to assist with more normalized gait (measured supine)   Time 3   Period Weeks   Status Unable to assess     PT SHORT TERM GOAL #3   Title Pt will demo AROM PF to 15 degrees to assist with decreased risk of falls   Time 3   Period Weeks   Status Achieved           PT Long Term Goals - 04/15/17 1052      PT LONG TERM  GOAL #1   Title Pt will demo improved DF/PF strength to 3+/5 to assist with gait around community   Baseline 2-/5 DF/PF   Time 6   Period Weeks   Status New     PT LONG TERM GOAL #2   Title Pt will demo improved inversion/eversion strength to 2/5 to assist with decreased falls risk   Baseline trace   Time 6   Period Weeks   Status New     PT LONG TERM GOAL #3   Title Pt will be I with advanced HEP to assist with improved strength for normalized gait and mobility   Baseline HEP given at eval   Time 6   Period Weeks   Status New               Plan - 05/06/17 1313    Clinical Impression Statement Able to degrease girth 1/8th inch post manual for swelling.  She is unable to wear her regular shoes.  ROM improving, balance improving.  No increased pain noted with session today.    PT Next Visit Plan review HEP; gait training; continue strengthening; pt did get financial assistance application.  Finalize HEP   PT Home Exercise Plan Isometrics,  ROM .stretching,  Ankle strength standing (for later) wall slides, facing wall   Consulted and Agree with Plan of Care Patient      Patient will benefit from skilled therapeutic intervention in order to improve the following deficits and impairments:  Abnormal gait, Decreased activity tolerance, Decreased balance, Decreased mobility, Decreased coordination, Decreased range of motion, Decreased strength, Increased edema, Difficulty walking, Pain  Visit Diagnosis: Muscle weakness (generalized)  Difficulty in walking, not elsewhere classified     Problem List There are no active problems to display for this patient.   Rottmann,Uriyah Massimo PTA 05/06/2017, 1:17 PM  [facility] [address] [place], [place], [address] Phone: [phone]   Fax:  [phone]  Name: Shirley Terrell MRN: [id number] Date of Birth: [birth year]

## 2017-05-13 ENCOUNTER — Ambulatory Visit: Payer: Medicaid Other | Admitting: Physical Therapy

## 2017-05-13 ENCOUNTER — Encounter: Payer: Self-pay | Admitting: Physical Therapy

## 2017-05-13 DIAGNOSIS — R262 Difficulty in walking, not elsewhere classified: Secondary | ICD-10-CM

## 2017-05-13 DIAGNOSIS — M6281 Muscle weakness (generalized): Secondary | ICD-10-CM | POA: Diagnosis not present

## 2017-05-13 NOTE — Therapy (Addendum)
Shirley Terrell, [place], [address] Phone: [phone]   Fax:  [phone]  Physical Therapy Treatment/Discharge Note  Patient Details  Name: Shirley Terrell MRN: [id number] Date of Birth: [birth year] Referring Provider: Alfredo Martinez  Encounter Date: 05/13/2017      PT End of Session - 05/13/17 1309    Visit Number 4   Number of Visits 12   Date for PT Re-Evaluation 05/27/17   PT Start Time 0934   PT Stop Time 1030   PT Time Calculation (min) 56 min   Activity Tolerance Patient tolerated treatment well   Behavior During Therapy [facility] for tasks assessed/performed      Past Medical History:  Diagnosis Date  . Anemia    takes iron supplement  . Asthma    prn inhaler  . Hypertension    states BP normalized while pregnant and since delivery; is currently not on med.  . Lisfranc dislocation 11/30/2016   left    Past Surgical History:  Procedure Laterality Date  . CHOLECYSTECTOMY  04/13/2012   Procedure: LAPAROSCOPIC CHOLECYSTECTOMY WITH INTRAOPERATIVE CHOLANGIOGRAM;  Surgeon: Odis Hollingshead, MD;  Location: [place];  Service: General;  Laterality: N/A;  laparoscopic cholecystectomy with IOC  . OPEN REDUCTION INTERNAL FIXATION (ORIF) FOOT LISFRANC FRACTURE Left 12/18/2016   Procedure: OPEN REDUCTION INTERNAL FIXATION (ORIF) FOOT LISFRANC FRACTURE;  Surgeon: Wylene Simmer, MD;  Location: [place];  Service: Orthopedics;  Laterality: Left;    There were no vitals filed for this visit.      Subjective Assessment - 05/13/17 0934    Subjective Able to get to "L" in the alphabet.  I push it a little more each day.  I am having less pain. It feels like it needs to pop, but it won't.  less swollen.  Not feeling heavy anymore.     Currently in Pain? No/denies   Pain Score 0-No pain   Pain Location Foot   Pain Orientation Left;Medial;Lateral   Pain Descriptors / Indicators Tightness   Pain Type Chronic pain    Pain Frequency Intermittent   Aggravating Factors  uneven surfaces   Pain Relieving Factors rest   Effect of Pain on Daily Activities aviods uneven                         [facility] Adult PT Treatment/Exercise - 05/13/17 0001      Vasopneumatic   Number Minutes Vasopneumatic  15 minutes   Vasopneumatic Pressure Low   Vasopneumatic Temperature  32  leg elevated     Manual Therapy   Manual therapy comments retrograde grade II mobs,  fibula Calcandistraction with IV/ER  Joint mobs with movement small pop.  Able to walk No limp af     Ankle Exercises: Aerobic   Elliptical 5 minutes, ramp 1     Ankle Exercises: Standing   SLS with ball toss/ catch ob rebounder, 1 in a row best.     Other Standing Ankle Exercises Cable cross pulls 20 LBS forward/reverse 5X each,  SBA                  PT Short Term Goals - 05/13/17 1306      PT SHORT TERM GOAL #1   Title Pt will be I with initial HEP to assist with gait training   Baseline independent with exercises added so far   Time 3   Period Weeks   Status  Achieved     PT SHORT TERM GOAL #2   Title Pt will demo improved AROM DF to lacking </= -15 degrees to assist with more normalized gait (measured supine)   Baseline visually not formally measured   Time 3   Period Weeks   Status Achieved     PT SHORT TERM GOAL #3   Title Pt will demo AROM PF to 15 degrees to assist with decreased risk of falls   Time 3   Period Weeks   Status Achieved           PT Long Term Goals - 05/13/17 1307      PT LONG TERM GOAL #1   Title Pt will demo improved DF/PF strength to 3+/5 to assist with gait around community   Time 6   Period Weeks   Status Unable to assess     PT LONG TERM GOAL #2   Title Pt will demo improved inversion/eversion strength to 2/5 to assist with decreased falls risk   Time 6   Period Weeks   Status Unable to assess     PT LONG TERM GOAL #3   Title Pt will be I with advanced HEP to assist  with improved strength for normalized gait and mobility   Baseline continued with exercises.  gait is normalized on level.     Time 6   Period Weeks   Status On-going               Plan - 05/13/17 1310    Clinical Impression Statement Able to walk without pain or limp post mobilization with movement ankle joint today. All STG's Met.  Patient unable to continue PT due to financial reasons.  She is currently looking into ways to fund her pt.  She wishes to be placed on hold for now.  Her POC is through7/11/18.  We will discharge is she does not schedule more visits prior to that date.     PT Next Visit Plan Hold PT due to financial reasong as patient requested.  She will call to schedule when she finds funding.  She is OK with D/C if she does not return prior to 05/27/2017   PT Home Exercise Plan Isometrics,  ROM .stretching,  Ankle strength standing (for later) wall slides, facing wall   Consulted and Agree with Plan of Care Patient      Patient will benefit from skilled therapeutic intervention in order to improve the following deficits and impairments:  Abnormal gait, Decreased activity tolerance, Decreased balance, Decreased mobility, Decreased coordination, Decreased range of motion, Decreased strength, Increased edema, Difficulty walking, Pain  Visit Diagnosis: Muscle weakness (generalized)  Difficulty in walking, not elsewhere classified     Problem List There are no active problems to display for this patient.   Virtue,Avin Upperman PTA 05/13/2017, 1:17 PM  [facility] [address] [place], [place], [address] Phone: [phone]   Fax:  [phone]  Name: Shirley Terrell MRN: [id number] Date of Birth: [birth year]   PHYSICAL THERAPY DISCHARGE SUMMARY  Visits from Start of Care: 4  Current functional level related to goals / functional outcomes: unknown  Remaining deficits: unknown   Education /  Equipment: HEP Plan:  Patient goals were partially met. Patient is being discharged due to not returning since the last visit.  ?????     Voncille Lo, PT Certified Exercise Expert for the Aging Adult  01/20/19 3:40 PM Phone: [phone] Fax: [phone]

## 2017-05-14 IMAGING — CT CT FOOT*L* W/O CM
3 series · 9 of 33 positions shown, 10 images · non-contrast
Comparison: Radiographs 2 hours prior.

CLINICAL DATA: Lisfranc dislocation.  Initial encounter.

EXAM:
CT OF THE LEFT FOOT WITHOUT CONTRAST
TECHNIQUE: Multidetector CT imaging of the left foot was performed according to
the standard protocol. Multiplanar CT image reconstructions were
also generated.

[Series 4: lower ext 1.5 st · axial · 0.40mm/px · z∈[+48,+48]mm · 1 of 151 slices shown, 2 images]
[im 81/151  soft-tissue]
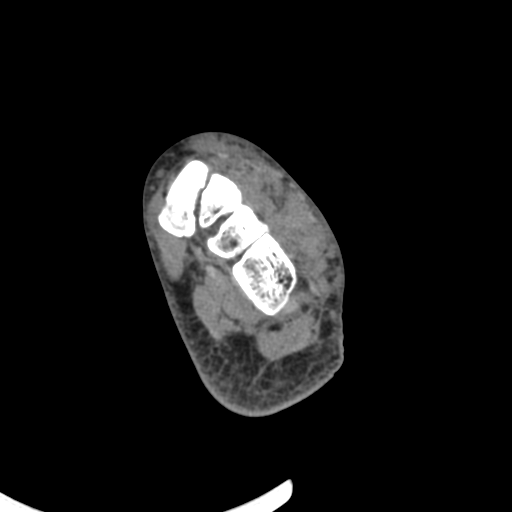
[im 81/151  bone]
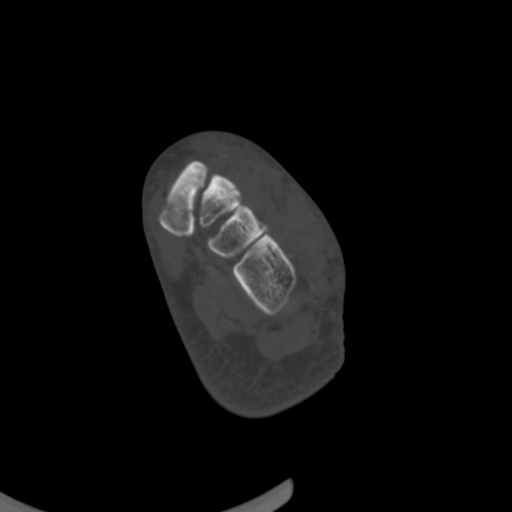

[Series 8: lower ext cor st · coronal · 0.24mm/px · 3 of 78 slices shown]
[im 16/78  bone]
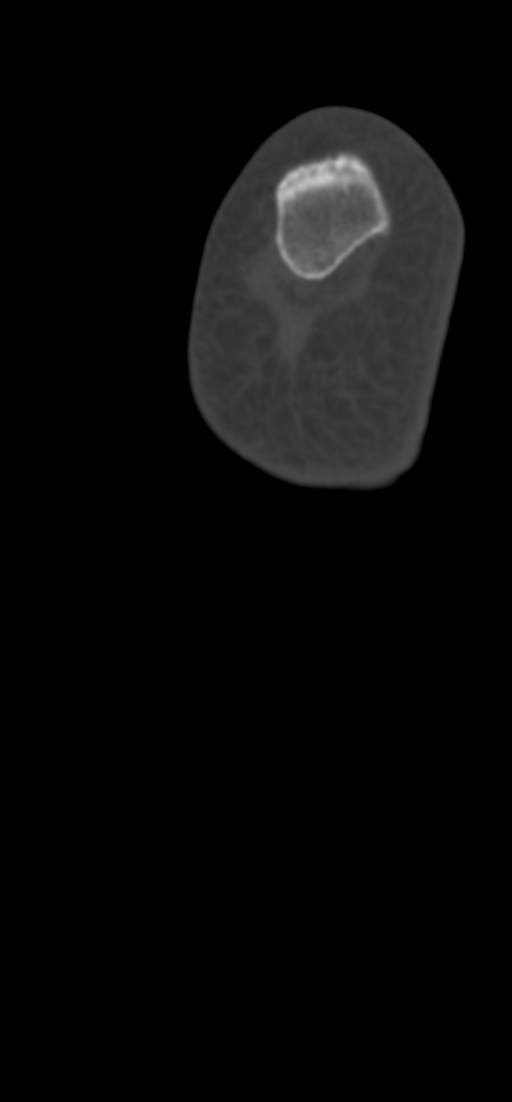
[im 31/78  bone]
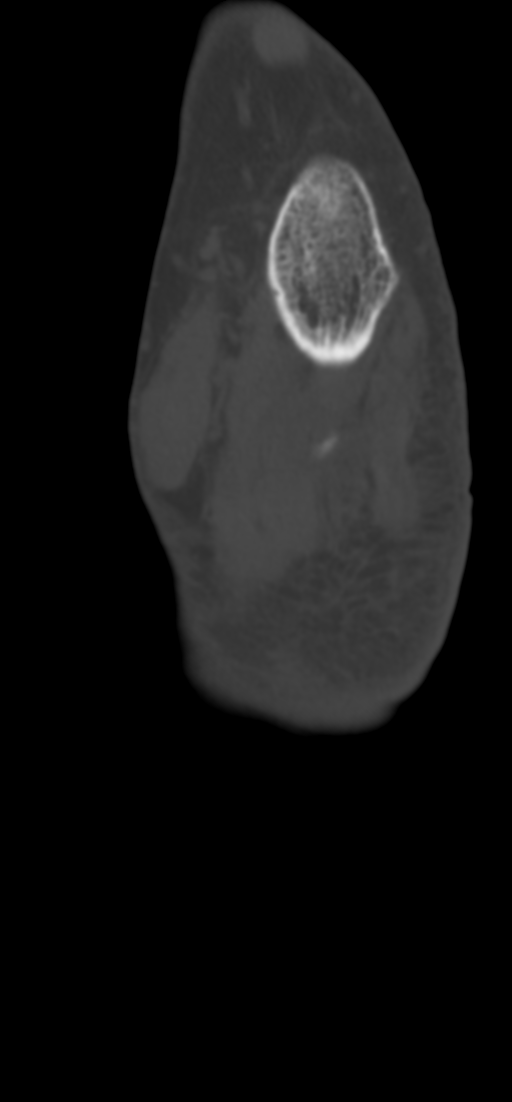
[im 47/78  bone]
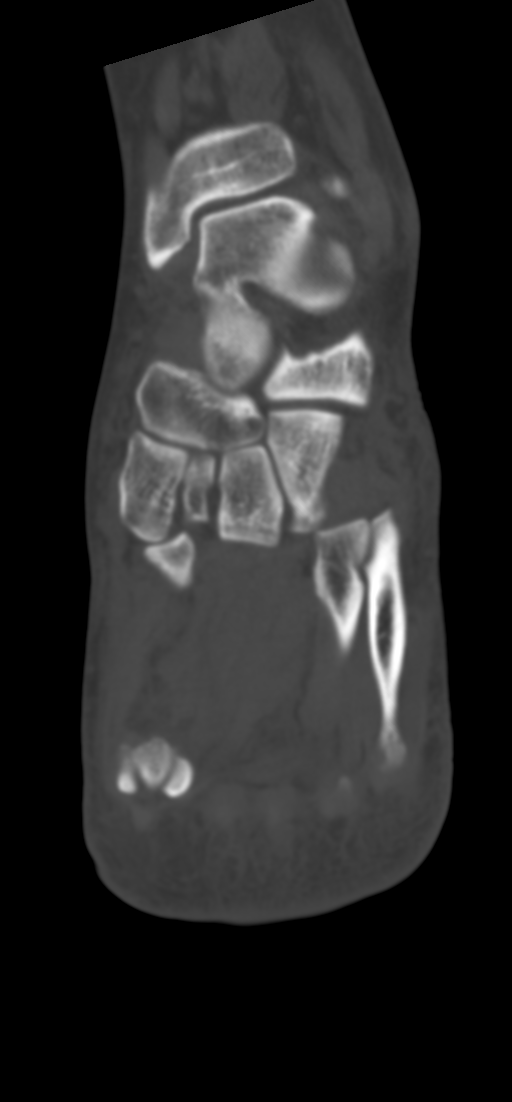

[Series 10: lower ext sag st · sagittal · 0.23mm/px · 5 of 67 slices shown]
[im 23/67  bone]
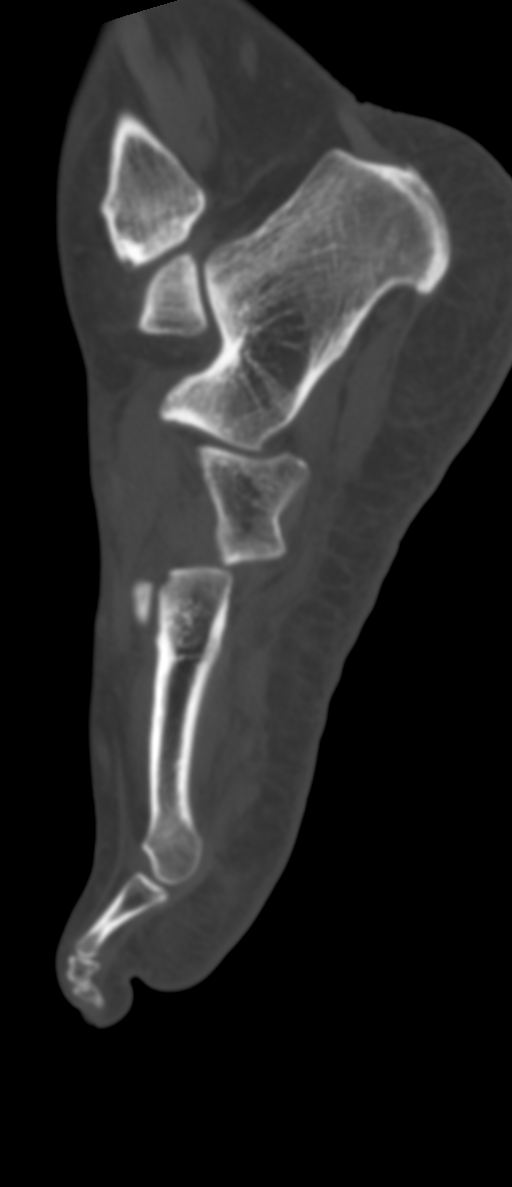
[im 28/67  bone]
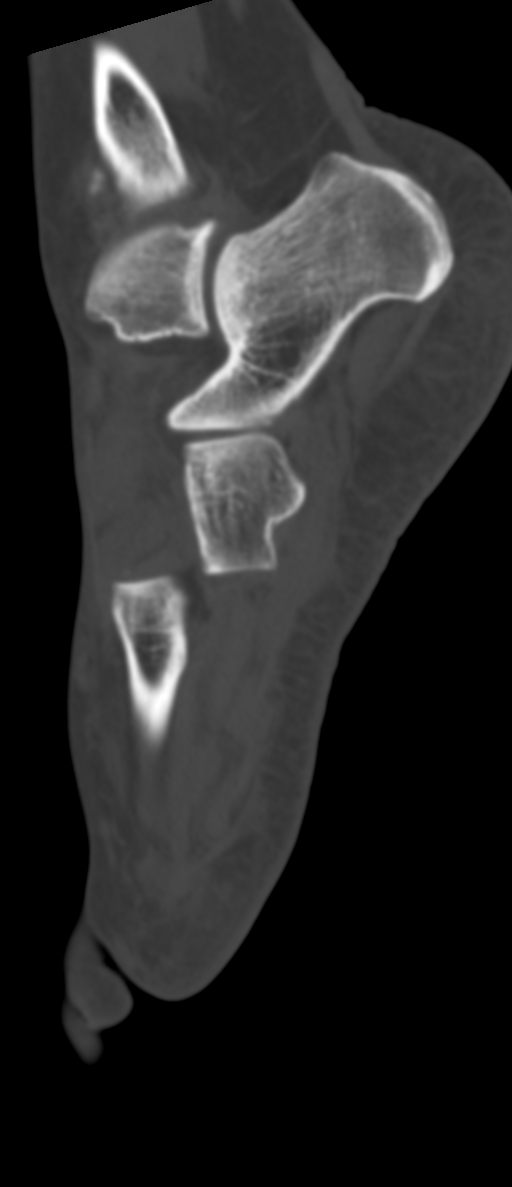
[im 34/67  bone]
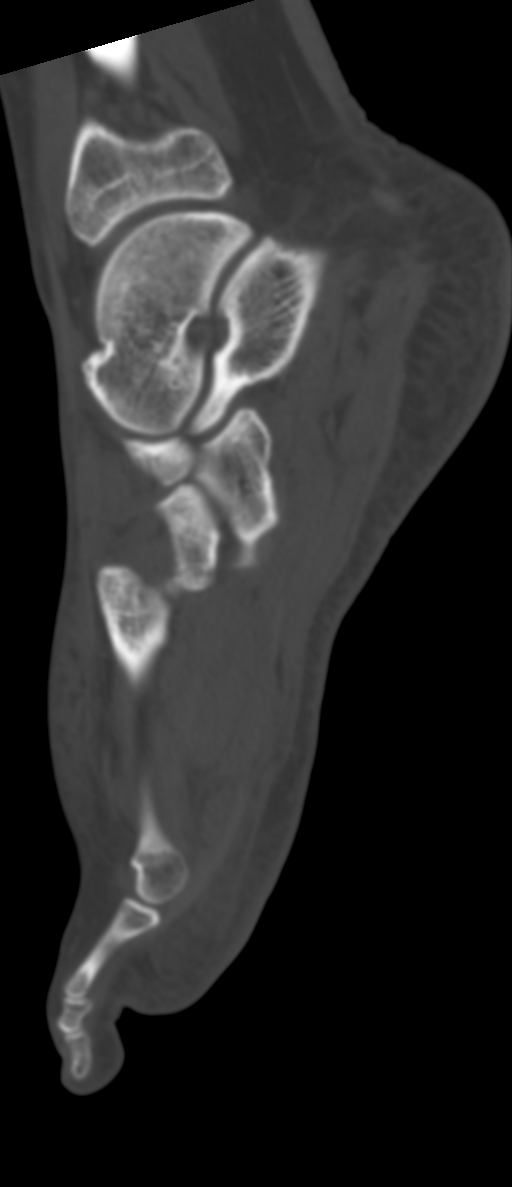
[im 39/67  bone]
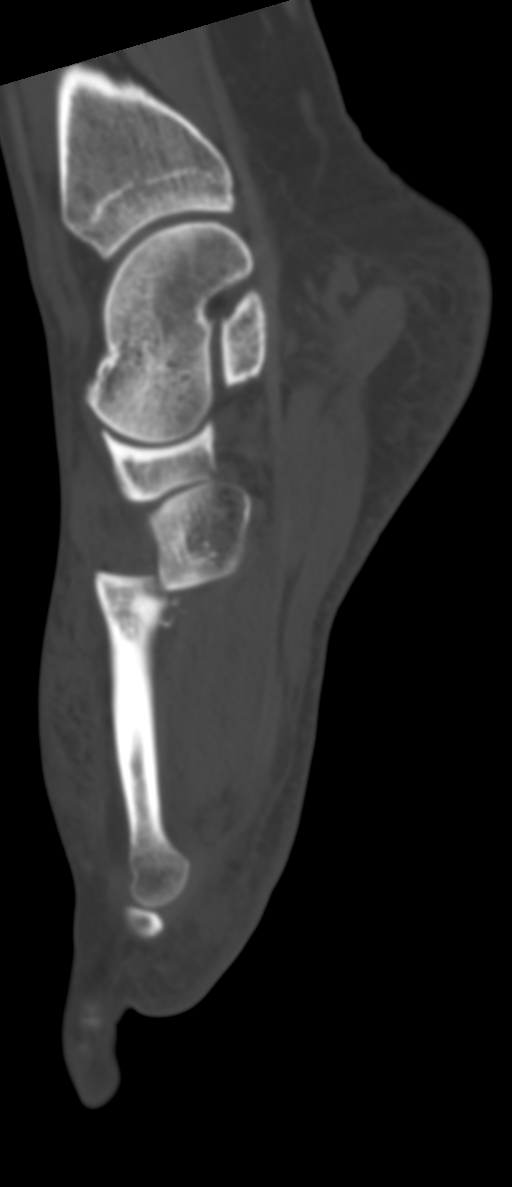
[im 45/67  bone]
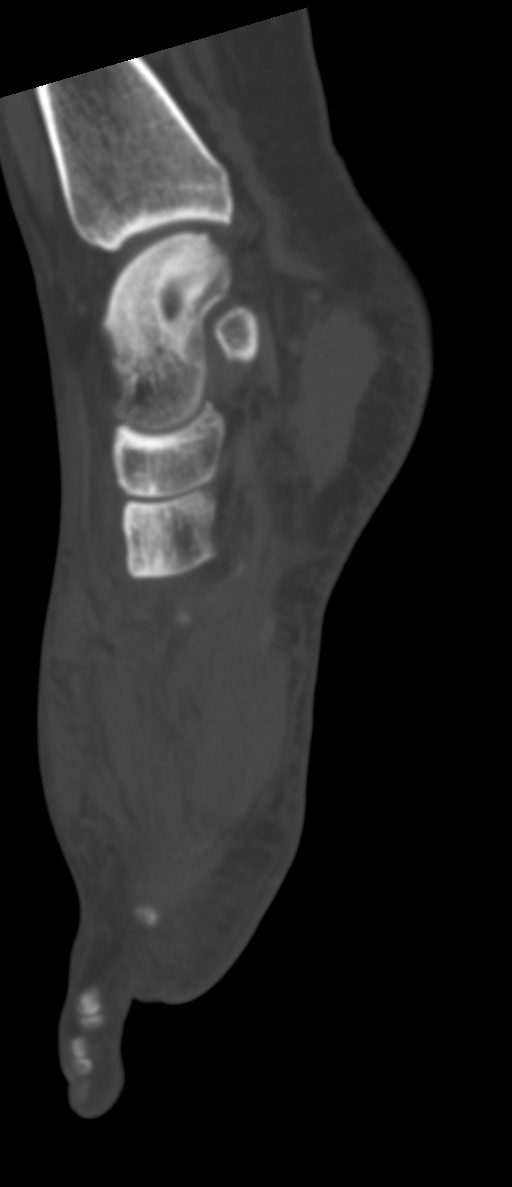

[9 of 33 positions shown; findings below may reference images not displayed]

FINDINGS: Bones/Joint/Cartilage

Lisfranc fracture dislocation. There is a fracture through the base
of the second metatarsal with plantar displacement of a 7 mm
fragment and adjacent small fracture fragments. Dorsal lateral
dislocation of the second through fifth metatarsals with respect to
the tarsals. Lateral subluxation of the first metatarsal. There is
an impaction fracture to the base of the first metatarsal at the
lateral articular surface. Widening of the calcaneal cuboid
articulation anteriorly.

Ligaments

Suboptimally assessed by CT. Disruption of the Lisfranc ligament due
to degree of osseous distraction.

Muscles and Tendons

Grossly intact where visualized.

Soft tissues

Dorsal soft tissue edema over the dorsal greater than plantar foot.
IMPRESSION: Homolateral Lisfranc dislocation injury with lateral subluxation of
all metatarsals, and dorsal dislocation of the second through fifth
metatarsals. Associated intra-articular fractures base of the second
metatarsal, displaced and mildly comminuted, and impacted base of
the first metatarsal.

## 2017-11-02 ENCOUNTER — Encounter (HOSPITAL_COMMUNITY): Payer: Self-pay | Admitting: *Deleted

## 2017-11-02 ENCOUNTER — Inpatient Hospital Stay (HOSPITAL_COMMUNITY)
Admission: AD | Admit: 2017-11-02 | Discharge: 2017-11-02 | Disposition: A | Discharge status: Home / Self Care | Payer: Medicaid Other | Source: Ambulatory Visit | Attending: Obstetrics and Gynecology | Admitting: Obstetrics and Gynecology

## 2017-11-02 DIAGNOSIS — K429 Umbilical hernia without obstruction or gangrene: Secondary | ICD-10-CM | POA: Diagnosis not present

## 2017-11-02 DIAGNOSIS — R109 Unspecified abdominal pain: Secondary | ICD-10-CM

## 2017-11-02 DIAGNOSIS — I10 Essential (primary) hypertension: Secondary | ICD-10-CM | POA: Diagnosis not present

## 2017-11-02 DIAGNOSIS — M6208 Separation of muscle (nontraumatic), other site: Secondary | ICD-10-CM | POA: Diagnosis not present

## 2017-11-02 DIAGNOSIS — F17210 Nicotine dependence, cigarettes, uncomplicated: Secondary | ICD-10-CM | POA: Insufficient documentation

## 2017-11-02 LAB — URINALYSIS, ROUTINE W REFLEX MICROSCOPIC
Bilirubin Urine: NEGATIVE
Glucose, UA: NEGATIVE mg/dL
Hgb urine dipstick: NEGATIVE
Ketones, ur: NEGATIVE mg/dL
LEUKOCYTES UA: NEGATIVE
NITRITE: NEGATIVE
PH: 7 (ref 5.0–8.0)
Protein, ur: NEGATIVE mg/dL
SPECIFIC GRAVITY, URINE: 1.006 (ref 1.005–1.030)

## 2017-11-02 LAB — POCT PREGNANCY, URINE: PREG TEST UR: NEGATIVE

## 2017-11-02 MED ORDER — AMLODIPINE BESYLATE 5 MG PO TABS: 5.0000 mg | ORAL_TABLET | Freq: Every day | ORAL | 0 refills | Status: DC

## 2017-11-02 NOTE — MAU Note (Signed)
Pt stated she has a "knot " on her stomach that she has had since she was pregnant last year. Was told it would go away but it has not and it makes the left side of her stomach hurt

## 2017-11-02 NOTE — Progress Notes (Signed)
Called Chaplain to come see patient.  Patient now states she does not want to speak with Chaplain.  Will be dc'd home per patient request after CNM puts order in for BP meds.

## 2017-11-02 NOTE — MAU Provider Note (Signed)
History     CSN: [id number]  Arrival date and time: 11/02/17 1018   First Provider Initiated Contact with Patient 11/02/17 1220      Chief Complaint  Patient presents with  . Abdominal Pain   31 y.o non-pregnant female here with "knot on stomach". First noticed it shortly after giving birth last year. Has gotten larger. Causes intermittent pain. Hasn't taken anything for it. No fevers. No urinary sx. Normal BMs. Using Nexplanon for contraception.   Past Medical History:  Diagnosis Date  . Anemia    takes iron supplement  . Asthma    prn inhaler  . Hypertension    states BP normalized while pregnant and since delivery; is currently not on med.  . Lisfranc dislocation 11/30/2016   left    Past Surgical History:  Procedure Laterality Date  . CHOLECYSTECTOMY  04/13/2012   Procedure: LAPAROSCOPIC CHOLECYSTECTOMY WITH INTRAOPERATIVE CHOLANGIOGRAM;  Surgeon: Adolph Pollackodd J Rosenbower, MD;  [facility];  Service: General;  Laterality: N/A;  laparoscopic cholecystectomy with IOC  . OPEN REDUCTION INTERNAL FIXATION (ORIF) FOOT LISFRANC FRACTURE Left 12/18/2016   Procedure: OPEN REDUCTION INTERNAL FIXATION (ORIF) FOOT LISFRANC FRACTURE;  Surgeon: Toni ArthursJohn Hewitt, MD;  Location: [facility];  Service: Orthopedics;  Laterality: Left;    Family History  Problem Relation Age of Onset  . Hypertension Mother   . Stroke Mother   . Heart disease Father   . Depression Father   . Cancer Paternal Grandmother        breast  . Diabetes Paternal Grandmother   . Cancer Paternal Grandfather   . Hypertension Maternal Grandmother   . Diabetes Other     Social History   Tobacco Use  . Smoking status: Current Some Day Smoker    Packs/day: 0.00    Years: 3.00    Pack years: 0.00    Types: Cigarettes  . Smokeless tobacco: Never Used  . Tobacco comment: 6 cigarettes/day  Substance Use Topics  . Alcohol use: Yes    Alcohol/week: 0.0 oz    Comment: occasionally  . Drug use: No     Allergies: No Known Allergies  Medications Prior to Admission  Medication Sig Dispense Refill Last Dose  . acetaminophen (TYLENOL) 500 MG tablet Take 2 tablets (1,000 mg total) by mouth every 6 (six) hours as needed for fever or headache. (Patient not taking: Reported on 04/15/2017) 30 tablet 0 Not Taking  . albuterol (PROVENTIL HFA;VENTOLIN HFA) 108 (90 BASE) MCG/ACT inhaler Inhale 2 puffs into the lungs every 6 (six) hours as needed for wheezing. Reported on 04/11/2016   Taking  . ferrous sulfate 325 (65 FE) MG tablet Take 325 mg by mouth daily with breakfast.   Taking  . oxyCODONE (ROXICODONE) 5 MG immediate release tablet Take 1-2 tablets (5-10 mg total) by mouth every 4 (four) hours as needed for moderate pain or severe pain. (Patient not taking: Reported on 04/15/2017) 20 tablet 0 Not Taking  . Prenat w/o A Vit-FeFum-FePo-FA (CONCEPT OB) 130-92.4-1 MG CAPS Take 1 tablet by mouth daily. 30 capsule 12 Taking    Review of Systems  Constitutional: Negative for fever.  Gastrointestinal: Positive for abdominal pain.  Genitourinary: Negative for dysuria.   Physical Exam   Blood pressure (!) 168/102, pulse 90, temperature 98.5 F (36.9 C), temperature source Oral, resp. rate 18, height 5\' 3"  (1.6 m), weight 225 lb (102.1 kg), last menstrual period 07/03/2017, currently breastfeeding.  Physical Exam  Nursing note and vitals reviewed. Constitutional: She  is oriented to person, place, and time. She appears well-developed and well-nourished. No distress.  HENT:  Head: Normocephalic and atraumatic.  Neck: Normal range of motion.  Respiratory: Effort normal. No respiratory distress.  GI: Soft. She exhibits no distension. There is no tenderness. There is no rebound and no guarding.    Musculoskeletal: Normal range of motion.  Neurological: She is alert and oriented to person, place, and time.  Skin: Skin is warm and dry.  Psychiatric: She has a normal mood and affect.   Results for  orders placed or performed during the hospital encounter of 11/02/17 (from the past 24 hour(s))  Urinalysis, Routine w reflex microscopic     Status: Abnormal   Collection Time: 11/02/17 10:25 AM  Result Value Ref Range   Color, Urine STRAW (A) YELLOW   APPearance CLEAR CLEAR   Specific Gravity, Urine 1.006 1.005 - 1.030   pH 7.0 5.0 - 8.0   Glucose, UA NEGATIVE NEGATIVE mg/dL   Hgb urine dipstick NEGATIVE NEGATIVE   Bilirubin Urine NEGATIVE NEGATIVE   Ketones, ur NEGATIVE NEGATIVE mg/dL   Protein, ur NEGATIVE NEGATIVE mg/dL   Nitrite NEGATIVE NEGATIVE   Leukocytes, UA NEGATIVE NEGATIVE  Pregnancy, urine POC     Status: None   Collection Time: 11/02/17 11:23 AM  Result Value Ref Range   Preg Test, Ur NEGATIVE NEGATIVE   MAU Course  Procedures  MDM Labs ordered and reviewed. No evidence if acute abdominal process. Will refer to general surgery for evaluation. HTN noted today. Pt was informed HTN was resolved after last pregnancy and did not need meds. Will start Norvasc and arrange PCP f/u. Stable for discharge home.   Assessment and Plan   1. Diastasis recti   2. Periumbilical hernia   3. Hypertension, unspecified type    Discharge home Follow up with PCP in 1 week for BP check[facility] Tylenol prn  Allergies as of 11/02/2017   No Known Allergies     Medication List    STOP taking these medications   acetaminophen 500 MG tablet Commonly known as:  TYLENOL   CONCEPT OB 130-92.4-1 MG Caps   ferrous sulfate 325 (65 FE) MG tablet   oxyCODONE 5 MG immediate release tablet Commonly known as:  ROXICODONE     TAKE these medications   albuterol 108 (90 Base) MCG/ACT inhaler Commonly known as:  PROVENTIL HFA;VENTOLIN HFA Inhale 2 puffs into the lungs every 6 (six) hours as needed for wheezing. Reported on 04/11/2016   amLODipine 5 MG tablet Commonly known  as:  NORVASC Take 1 tablet (5 mg total) by mouth daily.      Donette LarryMelanie Graciella Arment, CNM 11/02/2017, 12:26 PM

## 2017-11-02 NOTE — Discharge Instructions (Signed)
Hypertension Hypertension, commonly called high blood pressure, is when the force of blood pumping through the arteries is too strong. The arteries are the blood vessels that carry blood from the heart throughout the body. Hypertension forces the heart to work harder to pump blood and may cause arteries to become narrow or stiff. Having untreated or uncontrolled hypertension can cause heart attacks, strokes, kidney disease, and other problems. A blood pressure reading consists of a higher number over a lower number. Ideally, your blood pressure should be below 120/80. The first ("top") number is called the systolic pressure. It is a measure of the pressure in your arteries as your heart beats. The second ("bottom") number is called the diastolic pressure. It is a measure of the pressure in your arteries as the heart relaxes. What are the causes? The cause of this condition is not known. What increases the risk? Some risk factors for high blood pressure are under your control. Others are not. Factors you can change  Smoking.  Having type 2 diabetes mellitus, high cholesterol, or both.  Not getting enough exercise or physical activity.  Being overweight.  Having too much fat, sugar, calories, or salt (sodium) in your diet.  Drinking too much alcohol. Factors that are difficult or impossible to change  Having chronic kidney disease.  Having a family history of high blood pressure.  Age. Risk increases with age.  Race. You may be at higher risk if you are African-American.  Gender. Men are at higher risk than women before age 59. After age 32, women are at higher risk than men.  Having obstructive sleep apnea.  Stress. What are the signs or symptoms? Extremely high blood pressure (hypertensive crisis) may cause:  Headache.  Anxiety.  Shortness of breath.  Nosebleed.  Nausea and vomiting.  Severe chest pain.  Jerky movements you cannot control (seizures).  How is this  diagnosed? This condition is diagnosed by measuring your blood pressure while you are seated, with your arm resting on a surface. The cuff of the blood pressure monitor will be placed directly against the skin of your upper arm at the level of your heart. It should be measured at least twice using the same arm. Certain conditions can cause a difference in blood pressure between your right and left arms. Certain factors can cause blood pressure readings to be lower or higher than normal (elevated) for a short period of time:  When your blood pressure is higher when you are in a health care provider's office than when you are at home, this is called white coat hypertension. Most people with this condition do not need medicines.  When your blood pressure is higher at home than when you are in a health care provider's office, this is called masked hypertension. Most people with this condition may need medicines to control blood pressure.  If you have a high blood pressure reading during one visit or you have normal blood pressure with other risk factors:  You may be asked to return on a different day to have your blood pressure checked again.  You may be asked to monitor your blood pressure at home for 1 week or longer.  If you are diagnosed with hypertension, you may have other blood or imaging tests to help your health care provider understand your overall risk for other conditions. How is this treated? This condition is treated by making healthy lifestyle changes, such as eating healthy foods, exercising more, and reducing your alcohol intake. Your  health care provider may prescribe medicine if lifestyle changes are not enough to get your blood pressure under control, and if:  Your systolic blood pressure is above 130.  Your diastolic blood pressure is above 80.  Your personal target blood pressure may vary depending on your medical conditions, your age, and other factors. Follow these  instructions at home: Eating and drinking  Eat a diet that is high in fiber and potassium, and low in sodium, added sugar, and fat. An example eating plan is called the DASH (Dietary Approaches to Stop Hypertension) diet. To eat this way: ? Eat plenty of fresh fruits and vegetables. Try to fill half of your plate at each meal with fruits and vegetables. ? Eat whole grains, such as whole wheat pasta, brown rice, or whole grain bread. Fill about one quarter of your plate with whole grains. ? Eat or drink low-fat dairy products, such as skim milk or low-fat yogurt. ? Avoid fatty cuts of meat, processed or cured meats, and poultry with skin. Fill about one quarter of your plate with lean proteins, such as fish, chicken without skin, beans, eggs, and tofu. ? Avoid premade and processed foods. These tend to be higher in sodium, added sugar, and fat.  Reduce your daily sodium intake. Most people with hypertension should eat less than 1,500 mg of sodium a day.  Limit alcohol intake to no more than 1 drink a day for nonpregnant women and 2 drinks a day for men. One drink equals 12 oz of beer, 5 oz of wine, or 1 oz of hard liquor. Lifestyle  Work with your health care provider to maintain a healthy body weight or to lose weight. Ask what an ideal weight is for you.  Get at least 30 minutes of exercise that causes your heart to beat faster (aerobic exercise) most days of the week. Activities may include walking, swimming, or biking.  Include exercise to strengthen your muscles (resistance exercise), such as pilates or lifting weights, as part of your weekly exercise routine. Try to do these types of exercises for 30 minutes at least 3 days a week.  Do not use any products that contain nicotine or tobacco, such as cigarettes and e-cigarettes. If you need help quitting, ask your health care provider.  Monitor your blood pressure at home as told by your health care provider.  Keep all follow-up visits as  told by your health care provider. This is important. Medicines  Take over-the-counter and prescription medicines only as told by your health care provider. Follow directions carefully. Blood pressure medicines must be taken as prescribed.  Do not skip doses of blood pressure medicine. Doing this puts you at risk for problems and can make the medicine less effective.  Ask your health care provider about side effects or reactions to medicines that you should watch for. Contact a health care provider if:  You think you are having a reaction to a medicine you are taking.  You have headaches that keep coming back (recurring).  You feel dizzy.  You have swelling in your ankles.  You have trouble with your vision. Get help right away if:  You develop a severe headache or confusion.  You have unusual weakness or numbness.  You feel faint.  You have severe pain in your chest or abdomen.  You vomit repeatedly.  You have trouble breathing. Summary  Hypertension is when the force of blood pumping through your arteries is too strong. If this condition is not  controlled, it may put you at risk for serious complications.  Your personal target blood pressure may vary depending on your medical conditions, your age, and other factors. For most people, a normal blood pressure is less than 120/80.  Hypertension is treated with lifestyle changes, medicines, or a combination of both. Lifestyle changes include weight loss, eating a healthy, low-sodium diet, exercising more, and limiting alcohol. This information is not intended to replace advice given to you by your health care provider. Make sure you discuss any questions you have with your health care provider. Document Released: 11/03/2005 Document Revised: 10/01/2016 Document Reviewed: 10/01/2016 Elsevier Interactive Patient Education  2018 [employer] Diastasis Recti Diastasis recti is when the muscles of the abdomen (rectus abdominis  muscles) become thin and separate. The result is a wider space between the right and left abdomen (abdominal) muscles. This wider space between the muscles may cause a bulge in the middle of your abdomen. You may notice this bulge when you are straining or when you sit up from a lying down position. Diastasis recti can affect men and women. It is most common among pregnant women, infants, people who are obese, and people who have had abdominal surgery. Exercise or surgical treatment may help correct it. What are the causes? Common causes of this condition include:  Pregnancy. The growing uterus puts pressure on the abdominal muscles, which causes the muscles to separate.  Obesity. Excess fat puts pressure on abdominal muscles.  Weightlifting.  Some abdomen exercises.  Advanced age.  Genetics.  Prior abdominal surgery.  What increases the risk? This condition is more likely to develop in:  Women.  Newborns, especially newborns who are born early (prematurely).  What are the signs or symptoms? Common symptoms of this condition include:  A bulge in the middle of the abdomen. You will notice it most when you sit up or strain.  Pain in the low back, pelvis, or hips.  Constipation.  Inability to control when you urinate (urinary incontinence).  Bloating.  Poor posture.  How is this diagnosed? This condition is diagnosed with a physical exam. Your health care provider will ask you to lie flat on your back and do a crunch or half sit-up. If you have diastasis recti, a vertical bulge will appear between your abdominal muscles in the center of your abdomen. Your health care provider will measure the gap between your muscles with one of the following:  A medical device used to measure the space between two objects (caliper).  A tape measure.  CT scan.  Ultrasound.  Finger spaces. Your health care provider will measure the space using their fingers.  How is this treated? If  your muscle separation is not too large, you may not need treatment. However, if you are a woman who plans to become pregnant again, you should treat this condition before your next pregnancy. Treatment may include:  Physical therapy to strengthen and tighten your abdominal muscles.  Lifestyle changes such as weight loss and exercise.  Over-the-counter pain medicines as needed.  Surgery to correct the separation.  Follow these instructions at home: Activity  Return to your normal activities as told by your health care provider. Ask your health care provider what activities are safe for you.  When lifting weights or doing exercises using your abdominal muscles or the muscles in the center of your body that give stability (core muscles), make sure you are doing your exercises and movements correctly. Proper form can help to prevent the  condition from happening again. General instructions  If you are overweight, ask your health care provider for help with weight loss. Losing even a small amount of weight can help to improve your diastasis recti.  Take over-the-counter or prescription medicines only as told by your health care provider.  Do not strain. Straining can make the separation worse. Examples of straining include: ? Pushing hard to have a bowel movement, such as due to constipation. ? Lifting heavy objects, including children. ? Standing up and sitting down.  Take steps to prevent constipation: ? Drink enough fluid to keep your urine clear or pale yellow. ? Take over-the-counter or prescription medicines only as directed. ? Eat foods that are high in fiber, such as fresh fruits and vegetables, whole grains, and beans. ? Limit foods that are high in fat and processed sugars, such as fried and sweet foods. Contact a health care provider if:  You notice a new bulge in your abdomen. Get help right away if:  You experience severe discomfort in your abdomen.  You develop severe  abdominal pain along with nausea, vomiting, or fever. Summary  Diastasis recti is when the abdomen (abdominal) muscles become thin and separate. Your abdomen will stick out because the space between your right and left abdomen muscles has widened.  The most common symptom is a bulge in your abdomen. You will notice it most when you sit up or are straining.  This condition is diagnosed during a physical exam.  If the abdomen separation is not too big, you may choose not to have treatment. Otherwise, you may need to undergo physical therapy or surgery. This information is not intended to replace advice given to you by your health care provider. Make sure you discuss any questions you have with your health care provider. Document Released: 12/29/2016 Document Revised: 12/29/2016 Document Reviewed: 12/29/2016 Elsevier Interactive Patient Education  2018 [employer]   Hernia A hernia happens when an organ or tissue inside your body pushes out through a weak spot in the belly (abdomen). Follow these instructions at home:  Avoid stretching or overusing (straining) the muscles near the hernia.  Do not lift anything heavier than 10 lb (4.5 kg).  Use the muscles in your leg when you lift something up. Do not use the muscles in your back.  When you cough, try to cough gently.  Eat a diet that has a lot of fiber. Eat lots of fruits and vegetables.  Drink enough fluids to keep your pee (urine) clear or pale yellow. Try to drink 6-8 glasses of water a day.  Take medicines to make your poop soft (stool softeners) as told by your doctor.  Lose weight, if you are overweight.  Do not use any tobacco products, including cigarettes, chewing tobacco, or electronic cigarettes. If you need help quitting, ask your doctor.  Keep all follow-up visits as told by your doctor. This is important. Contact a doctor if:  The skin by the hernia gets puffy (swollen) or red.  The hernia is painful. Get  help right away if:  You have a fever.  You have belly pain that is getting worse.  You feel sick to your stomach (nauseous) or you throw up (vomit).  You cannot push the hernia back in place by gently pressing on it while you are lying down.  The hernia: ? Changes in shape or size. ? Is stuck outside your belly. ? Changes color. ? Feels hard or tender. This information is not intended  to replace advice given to you by your health care provider. Make sure you discuss any questions you have with your health care provider. Document Released: 04/23/2010 Document Revised: 04/10/2016 Document Reviewed: 09/13/2014 Elsevier Interactive Patient Education  [employer]2018 [employer] Inc[employer]

## 2017-11-05 ENCOUNTER — Ambulatory Visit: Payer: Medicaid Other | Admitting: Surgery

## 2017-12-02 ENCOUNTER — Ambulatory Visit: Payer: Self-pay | Admitting: Surgery

## 2017-12-02 NOTE — H&P (Signed)
Shirley Terrell DOB: [birth year] Single / Language: [place] / Race: Black or African American Female  History of Present Illness Patient words: This is a very nice and otherwise healthy 32 year old woman who has developed a bulge in the midline abdomen after her last baby which was 1 year ago. It causes some pain. She also has a midline diastases. She is interested in having the hernia fixed. She has had a laparoscopic cholecystectomy but no other surgeries. She smokes a few cigarettes every day. She plans to quit smoking.     Past Surgical History  Foot Surgery Left. Gallbladder Surgery - Laparoscopic  Diagnostic Studies History Colonoscopy never Mammogram never Pap Smear 1-5 years ago  Allergies  No Known Drug Allergies   Medication History  AmLODIPine Besylate (5MG  Tablet, Oral) Active. Proventil HFA (108 (90 Base)MCG/ACT Aerosol Soln, Inhalation) Active. Medications Reconciled Iron (90 (18 Fe)MG Tablet, Oral) Active.  Social History  Alcohol use Moderate alcohol use. Caffeine use Carbonated beverages, Tea. No drug use Tobacco use Current every day smoker.  Family History Alcohol Abuse Father. Cerebrovascular Accident Mother. Heart Disease Father. Heart disease in female family member before age 52 Hypertension Father, Mother.  Pregnancy / Birth History Age at menarche 14 years. Contraceptive History Contraceptive implant. Gravida 2 Irregular periods Length (months) of breastfeeding 7-12 Maternal age 60-20 Para 2  Other Problems  Asthma Back Pain Cholelithiasis High blood pressure Umbilical Hernia Repair     Review of Systems  General Present- Appetite Loss. Not Present- Chills, Fatigue, Fever, Night Sweats, Weight Gain and Weight Loss. Skin Not Present- Change in Wart/Mole, Dryness, Hives, Jaundice, New Lesions, Non-Healing Wounds, Rash and Ulcer. HEENT Present- Seasonal Allergies and Wears glasses/contact lenses.  Not Present- Earache, Hearing Loss, Hoarseness, Nose Bleed, Oral Ulcers, Ringing in the Ears, Sinus Pain, Sore Throat, Visual Disturbances and Yellow Eyes. Respiratory Not Present- Bloody sputum, Chronic Cough, Difficulty Breathing, Snoring and Wheezing. Breast Not Present- Breast Mass, Breast Pain, Nipple Discharge and Skin Changes. Cardiovascular Not Present- Chest Pain, Difficulty Breathing Lying Down, Leg Cramps, Palpitations, Rapid Heart Rate, Shortness of Breath and Swelling of Extremities. Gastrointestinal Present- Abdominal Pain. Not Present- Bloating, Bloody Stool, Change in Bowel Habits, Chronic diarrhea, Constipation, Difficulty Swallowing, Excessive gas, Gets full quickly at meals, Hemorrhoids, Indigestion, Nausea, Rectal Pain and Vomiting. Female Genitourinary Not Present- Frequency, Nocturia, Painful Urination, Pelvic Pain and Urgency. Musculoskeletal Present- Back Pain. Not Present- Joint Pain, Joint Stiffness, Muscle Pain, Muscle Weakness and Swelling of Extremities. Neurological Not Present- Decreased Memory, Fainting, Headaches, Numbness, Seizures, Tingling, Tremor, Trouble walking and Weakness. Psychiatric Not Present- Anxiety, Bipolar, Change in Sleep Pattern, Depression, Fearful and Frequent crying. Endocrine Not Present- Cold Intolerance, Excessive Hunger, Hair Changes, Heat Intolerance, Hot flashes and New Diabetes. Hematology Present- Easy Bruising. Not Present- Blood Thinners, Excessive bleeding, Gland problems, HIV and Persistent Infections.  Vitals  Weight: 222.8 lb Height: 63in Body Surface Area: 2.02 m Body Mass Index: 39.47 kg/m  Temp.: 97.43F  Pulse: 98 (Regular)  BP: 150/85 (Sitting, Left Arm, Standard)      Physical Exam   The physical exam findings are as follows: Note:Gen: A&Ox3, no distress Head: normocephalic, atraumatic, Eyes: extraocular motions intact, anicteric. Neck: supple without mass or thyromegaly Chest: unlabored respirations,  symmetrical air entry, clear bilaterally Cardiovascular: RRR with palpable distal pulses, no pedal edema Abdomen: soft, obese, nondistended, nontender. No mass or organomegaly. There is a broad midline diastases and a vague fullness superior to the umbilicus consistent with a hernia. Fascial defect nonpalpable. Extremities:  warm, without edema, no deformities Neuro: grossly intact, normal gait Psych: appropriate mood and affect, normal insight Skin: warm and dry    Assessment & Plan  VENTRAL HERNIA (K43.9) Story: I offered her a laparoscopic repair with mesh. Discussed risk of bleeding, infection, pain, scarring, intra-abdominal injury, hernia recurrence especially in the setting of nicotine use and abdominal girth. We discussed that the diastases would not be repaired, but if she wants to have this repaired she could consult the plastic surgeon and ideally would do both procedures same time. She at this point only wants to have the hernia fixed. We will plan schedule next few weeks

## 2017-12-04 ENCOUNTER — Encounter: Payer: Self-pay | Admitting: Nurse Practitioner

## 2017-12-04 ENCOUNTER — Ambulatory Visit: Payer: Medicaid Other | Attending: Nurse Practitioner | Admitting: Nurse Practitioner

## 2017-12-04 VITALS — BP 150/103 | HR 99 | Temp 98.3°F | Ht 63.0 in | Wt 227.4 lb

## 2017-12-04 DIAGNOSIS — Z9889 Other specified postprocedural states: Secondary | ICD-10-CM | POA: Insufficient documentation

## 2017-12-04 DIAGNOSIS — I10 Essential (primary) hypertension: Secondary | ICD-10-CM | POA: Insufficient documentation

## 2017-12-04 DIAGNOSIS — Z9049 Acquired absence of other specified parts of digestive tract: Secondary | ICD-10-CM | POA: Diagnosis not present

## 2017-12-04 DIAGNOSIS — Z823 Family history of stroke: Secondary | ICD-10-CM | POA: Insufficient documentation

## 2017-12-04 DIAGNOSIS — J45909 Unspecified asthma, uncomplicated: Secondary | ICD-10-CM | POA: Insufficient documentation

## 2017-12-04 DIAGNOSIS — O99210 Obesity complicating pregnancy, unspecified trimester: Secondary | ICD-10-CM | POA: Insufficient documentation

## 2017-12-04 DIAGNOSIS — Z809 Family history of malignant neoplasm, unspecified: Secondary | ICD-10-CM | POA: Diagnosis not present

## 2017-12-04 DIAGNOSIS — D649 Anemia, unspecified: Secondary | ICD-10-CM | POA: Diagnosis not present

## 2017-12-04 DIAGNOSIS — Z833 Family history of diabetes mellitus: Secondary | ICD-10-CM | POA: Insufficient documentation

## 2017-12-04 DIAGNOSIS — Z79899 Other long term (current) drug therapy: Secondary | ICD-10-CM | POA: Diagnosis not present

## 2017-12-04 DIAGNOSIS — Z6841 Body Mass Index (BMI) 40.0 and over, adult: Secondary | ICD-10-CM | POA: Insufficient documentation

## 2017-12-04 DIAGNOSIS — O99211 Obesity complicating pregnancy, first trimester: Secondary | ICD-10-CM | POA: Insufficient documentation

## 2017-12-04 DIAGNOSIS — Z23 Encounter for immunization: Secondary | ICD-10-CM | POA: Diagnosis not present

## 2017-12-04 DIAGNOSIS — Z803 Family history of malignant neoplasm of breast: Secondary | ICD-10-CM | POA: Diagnosis not present

## 2017-12-04 DIAGNOSIS — F17200 Nicotine dependence, unspecified, uncomplicated: Secondary | ICD-10-CM

## 2017-12-04 DIAGNOSIS — Z8249 Family history of ischemic heart disease and other diseases of the circulatory system: Secondary | ICD-10-CM | POA: Insufficient documentation

## 2017-12-04 MED ORDER — CARVEDILOL 12.5 MG PO TABS: 12.5000 mg | ORAL_TABLET | Freq: Two times a day (BID) | ORAL | 3 refills | Status: DC

## 2017-12-04 NOTE — Patient Instructions (Signed)
DASH Eating Plan DASH stands for "Dietary Approaches to Stop Hypertension." The DASH eating plan is a healthy eating plan that has been shown to reduce high blood pressure (hypertension). It may also reduce your risk for type 2 diabetes, heart disease, and stroke. The DASH eating plan may also help with weight loss. What are tips for following this plan? General guidelines  Avoid eating more than 2,300 mg (milligrams) of salt (sodium) a day. If you have hypertension, you may need to reduce your sodium intake to 1,500 mg a day.  Limit alcohol intake to no more than 1 drink a day for nonpregnant women and 2 drinks a day for men. One drink equals 12 oz of beer, 5 oz of wine, or 1 oz of hard liquor.  Work with your health care provider to maintain a healthy body weight or to lose weight. Ask what an ideal weight is for you.  Get at least 30 minutes of exercise that causes your heart to beat faster (aerobic exercise) most days of the week. Activities may include walking, swimming, or biking.  Work with your health care provider or diet and nutrition specialist (dietitian) to adjust your eating plan to your individual calorie needs. Reading food labels  Check food labels for the amount of sodium per serving. Choose foods with less than 5 percent of the Daily Value of sodium. Generally, foods with less than 300 mg of sodium per serving fit into this eating plan.  To find whole grains, look for the word "whole" as the first word in the ingredient list. Shopping  Buy products labeled as "low-sodium" or "no salt added."  Buy fresh foods. Avoid canned foods and premade or frozen meals. Cooking  Avoid adding salt when cooking. Use salt-free seasonings or herbs instead of table salt or sea salt. Check with your health care provider or pharmacist before using salt substitutes.  Do not fry foods. Cook foods using healthy methods such as baking, boiling, grilling, and broiling instead.  Cook with  heart-healthy oils, such as olive, canola, soybean, or sunflower oil. Meal planning   Eat a balanced diet that includes: ? 5 or more servings of fruits and vegetables each day. At each meal, try to fill half of your plate with fruits and vegetables. ? Up to 6-8 servings of whole grains each day. ? Less than 6 oz of lean meat, poultry, or fish each day. A 3-oz serving of meat is about the same size as a deck of cards. One egg equals 1 oz. ? 2 servings of low-fat dairy each day. ? A serving of nuts, seeds, or beans 5 times each week. ? Heart-healthy fats. Healthy fats called Omega-3 fatty acids are found in foods such as flaxseeds and coldwater fish, like sardines, salmon, and mackerel.  Limit how much you eat of the following: ? Canned or prepackaged foods. ? Food that is high in trans fat, such as fried foods. ? Food that is high in saturated fat, such as fatty meat. ? Sweets, desserts, sugary drinks, and other foods with added sugar. ? Full-fat dairy products.  Do not salt foods before eating.  Try to eat at least 2 vegetarian meals each week.  Eat more home-cooked food and less restaurant, buffet, and fast food.  When eating at a restaurant, ask that your food be prepared with less salt or no salt, if possible. What foods are recommended? The items listed may not be a complete list. Talk with your dietitian about what   dietary choices are best for you. Grains Whole-grain or whole-wheat bread. Whole-grain or whole-wheat pasta. Brown rice. Oatmeal. Quinoa. Bulgur. Whole-grain and low-sodium cereals. Pita bread. Low-fat, low-sodium crackers. Whole-wheat flour tortillas. Vegetables Fresh or frozen vegetables (raw, steamed, roasted, or grilled). Low-sodium or reduced-sodium tomato and vegetable juice. Low-sodium or reduced-sodium tomato sauce and tomato paste. Low-sodium or reduced-sodium canned vegetables. Fruits All fresh, dried, or frozen fruit. Canned fruit in natural juice (without  added sugar). Meat and other protein foods Skinless chicken or turkey. Ground chicken or turkey. Pork with fat trimmed off. Fish and seafood. Egg whites. Dried beans, peas, or lentils. Unsalted nuts, nut butters, and seeds. Unsalted canned beans. Lean cuts of beef with fat trimmed off. Low-sodium, lean deli meat. Dairy Low-fat (1%) or fat-free (skim) milk. Fat-free, low-fat, or reduced-fat cheeses. Nonfat, low-sodium ricotta or cottage cheese. Low-fat or nonfat yogurt. Low-fat, low-sodium cheese. Fats and oils Soft margarine without trans fats. Vegetable oil. Low-fat, reduced-fat, or light mayonnaise and salad dressings (reduced-sodium). Canola, safflower, olive, soybean, and sunflower oils. Avocado. Seasoning and other foods Herbs. Spices. Seasoning mixes without salt. Unsalted popcorn and pretzels. Fat-free sweets. What foods are not recommended? The items listed may not be a complete list. Talk with your dietitian about what dietary choices are best for you. Grains Baked goods made with fat, such as croissants, muffins, or some breads. Dry pasta or rice meal packs. Vegetables Creamed or fried vegetables. Vegetables in a cheese sauce. Regular canned vegetables (not low-sodium or reduced-sodium). Regular canned tomato sauce and paste (not low-sodium or reduced-sodium). Regular tomato and vegetable juice (not low-sodium or reduced-sodium). Pickles. Olives. Fruits Canned fruit in a light or heavy syrup. Fried fruit. Fruit in cream or butter sauce. Meat and other protein foods Fatty cuts of meat. Ribs. Fried meat. Bacon. Sausage. Bologna and other processed lunch meats. Salami. Fatback. Hotdogs. Bratwurst. Salted nuts and seeds. Canned beans with added salt. Canned or smoked fish. Whole eggs or egg yolks. Chicken or turkey with skin. Dairy Whole or 2% milk, cream, and half-and-half. Whole or full-fat cream cheese. Whole-fat or sweetened yogurt. Full-fat cheese. Nondairy creamers. Whipped toppings.  Processed cheese and cheese spreads. Fats and oils Butter. Stick margarine. Lard. Shortening. Ghee. Bacon fat. Tropical oils, such as coconut, palm kernel, or palm oil. Seasoning and other foods Salted popcorn and pretzels. Onion salt, garlic salt, seasoned salt, table salt, and sea salt. Worcestershire sauce. Tartar sauce. Barbecue sauce. Teriyaki sauce. Soy sauce, including reduced-sodium. Steak sauce. Canned and packaged gravies. Fish sauce. Oyster sauce. Cocktail sauce. Horseradish that you find on the shelf. Ketchup. Mustard. Meat flavorings and tenderizers. Bouillon cubes. Hot sauce and Tabasco sauce. Premade or packaged marinades. Premade or packaged taco seasonings. Relishes. Regular salad dressings. Where to find more information:  National Heart, Lung, and Blood Institute: www.nhlbi.nih.gov  American Heart Association: www.heart.org Summary  The DASH eating plan is a healthy eating plan that has been shown to reduce high blood pressure (hypertension). It may also reduce your risk for type 2 diabetes, heart disease, and stroke.  With the DASH eating plan, you should limit salt (sodium) intake to 2,300 mg a day. If you have hypertension, you may need to reduce your sodium intake to 1,500 mg a day.  When on the DASH eating plan, aim to eat more fresh fruits and vegetables, whole grains, lean proteins, low-fat dairy, and heart-healthy fats.  Work with your health care provider or diet and nutrition specialist (dietitian) to adjust your eating plan to your individual   calorie needs. This information is not intended to replace advice given to you by your health care provider. Make sure you discuss any questions you have with your health care provider. Document Released: 10/23/2011 Document Revised: 10/27/2016 Document Reviewed: 10/27/2016 Elsevier Interactive Patient Education  2018 Elsevier Inc.  Hypertension Hypertension, commonly called high blood pressure, is when the force of blood  pumping through the arteries is too strong. The arteries are the blood vessels that carry blood from the heart throughout the body. Hypertension forces the heart to work harder to pump blood and may cause arteries to become narrow or stiff. Having untreated or uncontrolled hypertension can cause heart attacks, strokes, kidney disease, and other problems. A blood pressure reading consists of a higher number over a lower number. Ideally, your blood pressure should be below 120/80. The first ("top") number is called the systolic pressure. It is a measure of the pressure in your arteries as your heart beats. The second ("bottom") number is called the diastolic pressure. It is a measure of the pressure in your arteries as the heart relaxes. What are the causes? The cause of this condition is not known. What increases the risk? Some risk factors for high blood pressure are under your control. Others are not. Factors you can change  Smoking.  Having type 2 diabetes mellitus, high cholesterol, or both.  Not getting enough exercise or physical activity.  Being overweight.  Having too much fat, sugar, calories, or salt (sodium) in your diet.  Drinking too much alcohol. Factors that are difficult or impossible to change  Having chronic kidney disease.  Having a family history of high blood pressure.  Age. Risk increases with age.  Race. You may be at higher risk if you are African-American.  Gender. Men are at higher risk than women before age 45. After age 65, women are at higher risk than men.  Having obstructive sleep apnea.  Stress. What are the signs or symptoms? Extremely high blood pressure (hypertensive crisis) may cause:  Headache.  Anxiety.  Shortness of breath.  Nosebleed.  Nausea and vomiting.  Severe chest pain.  Jerky movements you cannot control (seizures).  How is this diagnosed? This condition is diagnosed by measuring your blood pressure while you are  seated, with your arm resting on a surface. The cuff of the blood pressure monitor will be placed directly against the skin of your upper arm at the level of your heart. It should be measured at least twice using the same arm. Certain conditions can cause a difference in blood pressure between your right and left arms. Certain factors can cause blood pressure readings to be lower or higher than normal (elevated) for a short period of time:  When your blood pressure is higher when you are in a health care provider's office than when you are at home, this is called white coat hypertension. Most people with this condition do not need medicines.  When your blood pressure is higher at home than when you are in a health care provider's office, this is called masked hypertension. Most people with this condition may need medicines to control blood pressure.  If you have a high blood pressure reading during one visit or you have normal blood pressure with other risk factors:  You may be asked to return on a different day to have your blood pressure checked again.  You may be asked to monitor your blood pressure at home for 1 week or longer.  If you are   diagnosed with hypertension, you may have other blood or imaging tests to help your health care provider understand your overall risk for other conditions. How is this treated? This condition is treated by making healthy lifestyle changes, such as eating healthy foods, exercising more, and reducing your alcohol intake. Your health care provider may prescribe medicine if lifestyle changes are not enough to get your blood pressure under control, and if:  Your systolic blood pressure is above 130.  Your diastolic blood pressure is above 80.  Your personal target blood pressure may vary depending on your medical conditions, your age, and other factors. Follow these instructions at home: Eating and drinking  Eat a diet that is high in fiber and potassium,  and low in sodium, added sugar, and fat. An example eating plan is called the DASH (Dietary Approaches to Stop Hypertension) diet. To eat this way: ? Eat plenty of fresh fruits and vegetables. Try to fill half of your plate at each meal with fruits and vegetables. ? Eat whole grains, such as whole wheat pasta, brown rice, or whole grain bread. Fill about one quarter of your plate with whole grains. ? Eat or drink low-fat dairy products, such as skim milk or low-fat yogurt. ? Avoid fatty cuts of meat, processed or cured meats, and poultry with skin. Fill about one quarter of your plate with lean proteins, such as fish, chicken without skin, beans, eggs, and tofu. ? Avoid premade and processed foods. These tend to be higher in sodium, added sugar, and fat.  Reduce your daily sodium intake. Most people with hypertension should eat less than 1,500 mg of sodium a day.  Limit alcohol intake to no more than 1 drink a day for nonpregnant women and 2 drinks a day for men. One drink equals 12 oz of beer, 5 oz of wine, or 1 oz of hard liquor. Lifestyle  Work with your health care provider to maintain a healthy body weight or to lose weight. Ask what an ideal weight is for you.  Get at least 30 minutes of exercise that causes your heart to beat faster (aerobic exercise) most days of the week. Activities may include walking, swimming, or biking.  Include exercise to strengthen your muscles (resistance exercise), such as pilates or lifting weights, as part of your weekly exercise routine. Try to do these types of exercises for 30 minutes at least 3 days a week.  Do not use any products that contain nicotine or tobacco, such as cigarettes and e-cigarettes. If you need help quitting, ask your health care provider.  Monitor your blood pressure at home as told by your health care provider.  Keep all follow-up visits as told by your health care provider. This is important. Medicines  Take over-the-counter and  prescription medicines only as told by your health care provider. Follow directions carefully. Blood pressure medicines must be taken as prescribed.  Do not skip doses of blood pressure medicine. Doing this puts you at risk for problems and can make the medicine less effective.  Ask your health care provider about side effects or reactions to medicines that you should watch for. Contact a health care provider if:  You think you are having a reaction to a medicine you are taking.  You have headaches that keep coming back (recurring).  You feel dizzy.  You have swelling in your ankles.  You have trouble with your vision. Get help right away if:  You develop a severe headache or confusion.    You have unusual weakness or numbness.  You feel faint.  You have severe pain in your chest or abdomen.  You vomit repeatedly.  You have trouble breathing. Summary  Hypertension is when the force of blood pumping through your arteries is too strong. If this condition is not controlled, it may put you at risk for serious complications.  Your personal target blood pressure may vary depending on your medical conditions, your age, and other factors. For most people, a normal blood pressure is less than 120/80.  Hypertension is treated with lifestyle changes, medicines, or a combination of both. Lifestyle changes include weight loss, eating a healthy, low-sodium diet, exercising more, and limiting alcohol. This information is not intended to replace advice given to you by your health care provider. Make sure you discuss any questions you have with your health care provider. Document Released: 11/03/2005 Document Revised: 10/01/2016 Document Reviewed: 10/01/2016 Elsevier Interactive Patient Education  2018 Elsevier Inc.  

## 2017-12-04 NOTE — Progress Notes (Signed)
Assessment & Plan:  Shirley Terrell was seen today for new patient (initial visit).  Diagnoses and all orders for this visit:  Essential hypertension -     carvedilol (COREG) 12.5 MG tablet; Take 1 tablet (12.5 mg total) by mouth 2 (two) times daily with a meal. Continue all antihypertensives as prescribed.  Remember to bring in your blood pressure log with you for your follow up appointment.  DASH/Mediterranean Diets are healthier choices for HTN.    Tobacco dependence Shirley Terrell was counseled on the dangers of tobacco use, and was advised to quit. Reviewed strategies to maximize success, including removing cigarettes and smoking materials from environment, stress management and support of family/friends as well as pharmacological alternatives including: Wellbutrin, Chantix, Nicotine patch, Nicotine gum or lozenges. Smoking cessation support: smoking cessation hotline: 1-800-QUIT-NOW.  Smoking cessation classes are also available through [facility] and [facility]. Call [phone] or visit our website at [email or web address].   Spent 5 minutes counseling on smoking cessation and patient is ready to quit.  Morbid obesity with BMI of 40.0-44.9, adult (HCC) Discussed diet and exercise for person with BMI >25. Instructed: You must burn more calories than you eat. Losing 5 percent of your body weight should be considered a success. In the longer term, losing more than 15 percent of your body weight and staying at this weight is an extremely good result. However, keep in mind that even losing 5 percent of your body weight leads to important health benefits, so try not to get discouraged if you're not able to lose more than this. Will recheck weight in 3-6 months.  Immunization due -     Flu Vaccine QUAD 6+ mos PF IM (Fluarix Quad PF)    Patient has been counseled on age-appropriate routine health concerns for screening and prevention. These are reviewed and up-to-date. Referrals have been  placed accordingly. Immunizations are up-to-date or declined.    Subjective:   Chief Complaint  Patient presents with  . New Patient (Initial Visit)    Patient is here to establish care. Patient would like to talk about weight and hypertension. Patient stated she had a light headache today.   HPI ROSAISELA Terrell 32 y.o. female presents to office today to establish care as a new patient. She has a history of essential hypertension. She has upcoming surgery scheduled for laparoscopic ventral hernia repair with mesh in a few weeks.    Essential Hypertension She was diagnosed with essential hypertension about 2-3 years ago. Was taking Carvedilol 12.5 mg BID in the past however when she became pregnant over a year ago her carvedilol was stopped. She was recently restarted on amlodipine 10-2017. Today she reports increased swelling in her arms and hands as well as her BLE since she started taking amlodipine. Will stop norvasc and restart carvedilol. She reports no complications or side effects from taking carvedilol in the past. Denies chest pain, shortness of breath, palpitations, lightheadedness, or dizziness. Endorses BLE edema and mild headache today.    Review of Systems  Constitutional: Negative for fever, malaise/fatigue and weight loss.  HENT: Negative.  Negative for nosebleeds.   Eyes: Negative.  Negative for blurred vision, double vision and photophobia.  Respiratory: Negative.  Negative for cough and shortness of breath.   Cardiovascular: Positive for leg swelling (see HPI). Negative for chest pain and palpitations.  Gastrointestinal: Negative.  Negative for abdominal pain, constipation, diarrhea, heartburn, nausea and vomiting.  Musculoskeletal: Negative.  Negative for myalgias.  Neurological: Positive for headaches.  Negative for dizziness, focal weakness and seizures.  Endo/Heme/Allergies: Negative for environmental allergies.  Psychiatric/Behavioral: Negative.  Negative for  suicidal ideas.    Past Medical History:  Diagnosis Date  . Anemia    takes iron supplement  . Asthma    prn inhaler  . Hypertension    states BP normalized while pregnant and since delivery; is currently not on med.  . Lisfranc dislocation 11/30/2016   left    Past Surgical History:  Procedure Laterality Date  . CHOLECYSTECTOMY  04/13/2012   Procedure: LAPAROSCOPIC CHOLECYSTECTOMY WITH INTRAOPERATIVE CHOLANGIOGRAM;  Surgeon: Adolph Pollackodd J Rosenbower, MD;  [facility];  Service: General;  Laterality: N/A;  laparoscopic cholecystectomy with IOC  . OPEN REDUCTION INTERNAL FIXATION (ORIF) FOOT LISFRANC FRACTURE Left 12/18/2016   Procedure: OPEN REDUCTION INTERNAL FIXATION (ORIF) FOOT LISFRANC FRACTURE;  Surgeon: Toni ArthursJohn Hewitt, MD;  Location:  SUR[facility]ERY CENTER;  Service: Orthopedics;  Laterality: Left;    Family History  Problem Relation Age of Onset  . Hypertension Mother   . Stroke Mother   . Heart disease Father   . Depression Father   . Cancer Paternal Grandmother        breast  . Diabetes Paternal Grandmother   . Cancer Paternal Grandfather   . Hypertension Maternal Grandmother   . Diabetes Other     Social History Reviewed with no changes to be made today.   Outpatient Medications Prior to Visit  Medication Sig Dispense Refill  . albuterol (PROVENTIL HFA;VENTOLIN HFA) 108 (90 BASE) MCG/ACT inhaler Inhale 2 puffs into the lungs every 6 (six) hours as needed for wheezing. Reported on 04/11/2016    . Ferrous Sulfate (IRON SUPPLEMENT PO) Take by mouth.    Marland Kitchen. amLODipine (NORVASC) 5 MG tablet Take 1 tablet (5 mg total) by mouth daily. 30 tablet 0   No facility-administered medications prior to visit.     No Known Allergies     Objective:    BP (!) 150/103 (BP Location: Right Arm, Cuff Size: Normal)   Pulse 99   Temp 98.3 F (36.8 C) (Oral)   Ht 5\' 3"  (1.6 m)   Wt 227 lb 6.4 oz (103.1 kg)   SpO2 97%   BMI 40.28 kg/m  Wt Readings from Last 3 Encounters:    12/04/17 227 lb 6.4 oz (103.1 kg)  11/02/17 225 lb (102.1 kg)  12/18/16 201 lb (91.2 kg)    Physical Exam  Constitutional: She is oriented to person, place, and time. She appears well-developed and well-nourished. She is cooperative.  HENT:  Head: Normocephalic and atraumatic.  Eyes: EOM are normal.  Neck: Normal range of motion.  Cardiovascular: Normal rate, regular rhythm, normal heart sounds and intact distal pulses. Exam reveals no gallop and no friction rub.  No murmur heard. Pulmonary/Chest: Effort normal and breath sounds normal. No tachypnea. No respiratory distress. She has no decreased breath sounds. She has no wheezes. She has no rhonchi. She has no rales. She exhibits no tenderness.  Abdominal: Soft. Bowel sounds are normal.  Musculoskeletal: Normal range of motion. She exhibits edema (bilateral wrist edema).  Neurological: She is alert and oriented to person, place, and time. Coordination normal.  Skin: Skin is warm and dry.  Psychiatric: She has a normal mood and affect. Her behavior is normal. Judgment and thought content normal.  Nursing note and vitals reviewed.     Patient has been counseled extensively about nutrition and exercise as well as the importance of adherence with medications and regular  follow-up. The patient was given clear instructions to go to ER or return to medical center if symptoms don't improve, worsen or new problems develop. The patient verbalized understanding.   Follow-up: Return in about 2 weeks (around 12/18/2017) for BP recheck and fasting labs .   Claiborne Rigg, FNP-BC [facility] [place], [place] [phone]   12/04/2017, 5:14 PM

## 2017-12-22 ENCOUNTER — Ambulatory Visit: Payer: Medicaid Other | Attending: Nurse Practitioner | Admitting: Nurse Practitioner

## 2017-12-22 ENCOUNTER — Encounter: Payer: Self-pay | Admitting: Nurse Practitioner

## 2017-12-22 VITALS — BP 149/105 | HR 87 | Temp 98.4°F | Ht 63.0 in | Wt 228.6 lb

## 2017-12-22 DIAGNOSIS — F17210 Nicotine dependence, cigarettes, uncomplicated: Secondary | ICD-10-CM | POA: Diagnosis not present

## 2017-12-22 DIAGNOSIS — Z79899 Other long term (current) drug therapy: Secondary | ICD-10-CM | POA: Diagnosis not present

## 2017-12-22 DIAGNOSIS — J45909 Unspecified asthma, uncomplicated: Secondary | ICD-10-CM | POA: Insufficient documentation

## 2017-12-22 DIAGNOSIS — I10 Essential (primary) hypertension: Secondary | ICD-10-CM | POA: Diagnosis not present

## 2017-12-22 DIAGNOSIS — F17200 Nicotine dependence, unspecified, uncomplicated: Secondary | ICD-10-CM

## 2017-12-22 DIAGNOSIS — K439 Ventral hernia without obstruction or gangrene: Secondary | ICD-10-CM | POA: Insufficient documentation

## 2017-12-22 MED ORDER — HYDROCHLOROTHIAZIDE 25 MG PO TABS: 25.0000 mg | ORAL_TABLET | Freq: Every day | ORAL | 3 refills | Status: DC

## 2017-12-22 NOTE — Patient Instructions (Addendum)
DASH Eating Plan DASH stands for "Dietary Approaches to Stop Hypertension." The DASH eating plan is a healthy eating plan that has been shown to reduce high blood pressure (hypertension). It may also reduce your risk for type 2 diabetes, heart disease, and stroke. The DASH eating plan may also help with weight loss. What are tips for following this plan? General guidelines  Avoid eating more than 2,300 mg (milligrams) of salt (sodium) a day. If you have hypertension, you may need to reduce your sodium intake to 1,500 mg a day.  Limit alcohol intake to no more than 1 drink a day for nonpregnant women and 2 drinks a day for men. One drink equals 12 oz of beer, 5 oz of wine, or 1 oz of hard liquor.  Work with your health care provider to maintain a healthy body weight or to lose weight. Ask what an ideal weight is for you.  Get at least 30 minutes of exercise that causes your heart to beat faster (aerobic exercise) most days of the week. Activities may include walking, swimming, or biking.  Work with your health care provider or diet and nutrition specialist (dietitian) to adjust your eating plan to your individual calorie needs. Reading food labels  Check food labels for the amount of sodium per serving. Choose foods with less than 5 percent of the Daily Value of sodium. Generally, foods with less than 300 mg of sodium per serving fit into this eating plan.  To find whole grains, look for the word "whole" as the first word in the ingredient list. Shopping  Buy products labeled as "low-sodium" or "no salt added."  Buy fresh foods. Avoid canned foods and premade or frozen meals. Cooking  Avoid adding salt when cooking. Use salt-free seasonings or herbs instead of table salt or sea salt. Check with your health care provider or pharmacist before using salt substitutes.  Do not fry foods. Cook foods using healthy methods such as baking, boiling, grilling, and broiling instead.  Cook with  heart-healthy oils, such as olive, canola, soybean, or sunflower oil. Meal planning   Eat a balanced diet that includes: ? 5 or more servings of fruits and vegetables each day. At each meal, try to fill half of your plate with fruits and vegetables. ? Up to 6-8 servings of whole grains each day. ? Less than 6 oz of lean meat, poultry, or fish each day. A 3-oz serving of meat is about the same size as a deck of cards. One egg equals 1 oz. ? 2 servings of low-fat dairy each day. ? A serving of nuts, seeds, or beans 5 times each week. ? Heart-healthy fats. Healthy fats called Omega-3 fatty acids are found in foods such as flaxseeds and coldwater fish, like sardines, salmon, and mackerel.  Limit how much you eat of the following: ? Canned or prepackaged foods. ? Food that is high in trans fat, such as fried foods. ? Food that is high in saturated fat, such as fatty meat. ? Sweets, desserts, sugary drinks, and other foods with added sugar. ? Full-fat dairy products.  Do not salt foods before eating.  Try to eat at least 2 vegetarian meals each week.  Eat more home-cooked food and less restaurant, buffet, and fast food.  When eating at a restaurant, ask that your food be prepared with less salt or no salt, if possible. What foods are recommended? The items listed may not be a complete list. Talk with your dietitian about what   dietary choices are best for you. Grains Whole-grain or whole-wheat bread. Whole-grain or whole-wheat pasta. Brown rice. Oatmeal. Quinoa. Bulgur. Whole-grain and low-sodium cereals. Pita bread. Low-fat, low-sodium crackers. Whole-wheat flour tortillas. Vegetables Fresh or frozen vegetables (raw, steamed, roasted, or grilled). Low-sodium or reduced-sodium tomato and vegetable juice. Low-sodium or reduced-sodium tomato sauce and tomato paste. Low-sodium or reduced-sodium canned vegetables. Fruits All fresh, dried, or frozen fruit. Canned fruit in natural juice (without  added sugar). Meat and other protein foods Skinless chicken or turkey. Ground chicken or turkey. Pork with fat trimmed off. Fish and seafood. Egg whites. Dried beans, peas, or lentils. Unsalted nuts, nut butters, and seeds. Unsalted canned beans. Lean cuts of beef with fat trimmed off. Low-sodium, lean deli meat. Dairy Low-fat (1%) or fat-free (skim) milk. Fat-free, low-fat, or reduced-fat cheeses. Nonfat, low-sodium ricotta or cottage cheese. Low-fat or nonfat yogurt. Low-fat, low-sodium cheese. Fats and oils Soft margarine without trans fats. Vegetable oil. Low-fat, reduced-fat, or light mayonnaise and salad dressings (reduced-sodium). Canola, safflower, olive, soybean, and sunflower oils. Avocado. Seasoning and other foods Herbs. Spices. Seasoning mixes without salt. Unsalted popcorn and pretzels. Fat-free sweets. What foods are not recommended? The items listed may not be a complete list. Talk with your dietitian about what dietary choices are best for you. Grains Baked goods made with fat, such as croissants, muffins, or some breads. Dry pasta or rice meal packs. Vegetables Creamed or fried vegetables. Vegetables in a cheese sauce. Regular canned vegetables (not low-sodium or reduced-sodium). Regular canned tomato sauce and paste (not low-sodium or reduced-sodium). Regular tomato and vegetable juice (not low-sodium or reduced-sodium). Pickles. Olives. Fruits Canned fruit in a light or heavy syrup. Fried fruit. Fruit in cream or butter sauce. Meat and other protein foods Fatty cuts of meat. Ribs. Fried meat. Bacon. Sausage. Bologna and other processed lunch meats. Salami. Fatback. Hotdogs. Bratwurst. Salted nuts and seeds. Canned beans with added salt. Canned or smoked fish. Whole eggs or egg yolks. Chicken or turkey with skin. Dairy Whole or 2% milk, cream, and half-and-half. Whole or full-fat cream cheese. Whole-fat or sweetened yogurt. Full-fat cheese. Nondairy creamers. Whipped toppings.  Processed cheese and cheese spreads. Fats and oils Butter. Stick margarine. Lard. Shortening. Ghee. Bacon fat. Tropical oils, such as coconut, palm kernel, or palm oil. Seasoning and other foods Salted popcorn and pretzels. Onion salt, garlic salt, seasoned salt, table salt, and sea salt. Worcestershire sauce. Tartar sauce. Barbecue sauce. Teriyaki sauce. Soy sauce, including reduced-sodium. Steak sauce. Canned and packaged gravies. Fish sauce. Oyster sauce. Cocktail sauce. Horseradish that you find on the shelf. Ketchup. Mustard. Meat flavorings and tenderizers. Bouillon cubes. Hot sauce and Tabasco sauce. Premade or packaged marinades. Premade or packaged taco seasonings. Relishes. Regular salad dressings. Where to find more information:  National Heart, Lung, and Blood Institute: www.nhlbi.nih.gov  American Heart Association: www.heart.org Summary  The DASH eating plan is a healthy eating plan that has been shown to reduce high blood pressure (hypertension). It may also reduce your risk for type 2 diabetes, heart disease, and stroke.  With the DASH eating plan, you should limit salt (sodium) intake to 2,300 mg a day. If you have hypertension, you may need to reduce your sodium intake to 1,500 mg a day.  When on the DASH eating plan, aim to eat more fresh fruits and vegetables, whole grains, lean proteins, low-fat dairy, and heart-healthy fats.  Work with your health care provider or diet and nutrition specialist (dietitian) to adjust your eating plan to your individual   calorie needs. This information is not intended to replace advice given to you by your health care provider. Make sure you discuss any questions you have with your health care provider. Document Released: 10/23/2011 Document Revised: 10/27/2016 Document Reviewed: 10/27/2016 Elsevier Interactive Patient Education  2018 [employer]  Hypertension Hypertension, commonly called high blood pressure, is when the force of blood  pumping through the arteries is too strong. The arteries are the blood vessels that carry blood from the heart throughout the body. Hypertension forces the heart to work harder to pump blood and may cause arteries to become narrow or stiff. Having untreated or uncontrolled hypertension can cause heart attacks, strokes, kidney disease, and other problems. A blood pressure reading consists of a higher number over a lower number. Ideally, your blood pressure should be below 120/80. The first ("top") number is called the systolic pressure. It is a measure of the pressure in your arteries as your heart beats. The second ("bottom") number is called the diastolic pressure. It is a measure of the pressure in your arteries as the heart relaxes. What are the causes? The cause of this condition is not known. What increases the risk? Some risk factors for high blood pressure are under your control. Others are not. Factors you can change  Smoking.  Having type 2 diabetes mellitus, high cholesterol, or both.  Not getting enough exercise or physical activity.  Being overweight.  Having too much fat, sugar, calories, or salt (sodium) in your diet.  Drinking too much alcohol. Factors that are difficult or impossible to change  Having chronic kidney disease.  Having a family history of high blood pressure.  Age. Risk increases with age.  Race. You may be at higher risk if you are African-American.  Gender. Men are at higher risk than women before age 45. After age 65, women are at higher risk than men.  Having obstructive sleep apnea.  Stress. What are the signs or symptoms? Extremely high blood pressure (hypertensive crisis) may cause:  Headache.  Anxiety.  Shortness of breath.  Nosebleed.  Nausea and vomiting.  Severe chest pain.  Jerky movements you cannot control (seizures).  How is this diagnosed? This condition is diagnosed by measuring your blood pressure while you are  seated, with your arm resting on a surface. The cuff of the blood pressure monitor will be placed directly against the skin of your upper arm at the level of your heart. It should be measured at least twice using the same arm. Certain conditions can cause a difference in blood pressure between your right and left arms. Certain factors can cause blood pressure readings to be lower or higher than normal (elevated) for a short period of time:  When your blood pressure is higher when you are in a health care provider's office than when you are at home, this is called white coat hypertension. Most people with this condition do not need medicines.  When your blood pressure is higher at home than when you are in a health care provider's office, this is called masked hypertension. Most people with this condition may need medicines to control blood pressure.  If you have a high blood pressure reading during one visit or you have normal blood pressure with other risk factors:  You may be asked to return on a different day to have your blood pressure checked again.  You may be asked to monitor your blood pressure at home for 1 week or longer.  If you are   diagnosed with hypertension, you may have other blood or imaging tests to help your health care provider understand your overall risk for other conditions. How is this treated? This condition is treated by making healthy lifestyle changes, such as eating healthy foods, exercising more, and reducing your alcohol intake. Your health care provider may prescribe medicine if lifestyle changes are not enough to get your blood pressure under control, and if:  Your systolic blood pressure is above 130.  Your diastolic blood pressure is above 80.  Your personal target blood pressure may vary depending on your medical conditions, your age, and other factors. Follow these instructions at home: Eating and drinking  Eat a diet that is high in fiber and potassium,  and low in sodium, added sugar, and fat. An example eating plan is called the DASH (Dietary Approaches to Stop Hypertension) diet. To eat this way: ? Eat plenty of fresh fruits and vegetables. Try to fill half of your plate at each meal with fruits and vegetables. ? Eat whole grains, such as whole wheat pasta, brown rice, or whole grain bread. Fill about one quarter of your plate with whole grains. ? Eat or drink low-fat dairy products, such as skim milk or low-fat yogurt. ? Avoid fatty cuts of meat, processed or cured meats, and poultry with skin. Fill about one quarter of your plate with lean proteins, such as fish, chicken without skin, beans, eggs, and tofu. ? Avoid premade and processed foods. These tend to be higher in sodium, added sugar, and fat.  Reduce your daily sodium intake. Most people with hypertension should eat less than 1,500 mg of sodium a day.  Limit alcohol intake to no more than 1 drink a day for nonpregnant women and 2 drinks a day for men. One drink equals 12 oz of beer, 5 oz of wine, or 1 oz of hard liquor. Lifestyle  Work with your health care provider to maintain a healthy body weight or to lose weight. Ask what an ideal weight is for you.  Get at least 30 minutes of exercise that causes your heart to beat faster (aerobic exercise) most days of the week. Activities may include walking, swimming, or biking.  Include exercise to strengthen your muscles (resistance exercise), such as pilates or lifting weights, as part of your weekly exercise routine. Try to do these types of exercises for 30 minutes at least 3 days a week.  Do not use any products that contain nicotine or tobacco, such as cigarettes and e-cigarettes. If you need help quitting, ask your health care provider.  Monitor your blood pressure at home as told by your health care provider.  Keep all follow-up visits as told by your health care provider. This is important. Medicines  Take over-the-counter and  prescription medicines only as told by your health care provider. Follow directions carefully. Blood pressure medicines must be taken as prescribed.  Do not skip doses of blood pressure medicine. Doing this puts you at risk for problems and can make the medicine less effective.  Ask your health care provider about side effects or reactions to medicines that you should watch for. Contact a health care provider if:  You think you are having a reaction to a medicine you are taking.  You have headaches that keep coming back (recurring).  You feel dizzy.  You have swelling in your ankles.  You have trouble with your vision. Get help right away if:  You develop a severe headache or confusion.    You have unusual weakness or numbness.  You feel faint.  You have severe pain in your chest or abdomen.  You vomit repeatedly.  You have trouble breathing. Summary  Hypertension is when the force of blood pumping through your arteries is too strong. If this condition is not controlled, it may put you at risk for serious complications.  Your personal target blood pressure may vary depending on your medical conditions, your age, and other factors. For most people, a normal blood pressure is less than 120/80.  Hypertension is treated with lifestyle changes, medicines, or a combination of both. Lifestyle changes include weight loss, eating a healthy, low-sodium diet, exercising more, and limiting alcohol. This information is not intended to replace advice given to you by your health care provider. Make sure you discuss any questions you have with your health care provider. Document Released: 11/03/2005 Document Revised: 10/01/2016 Document Reviewed: 10/01/2016 Elsevier Interactive Patient Education  2018 [employer].  Hypertension Hypertension, commonly called high blood pressure, is when the force of blood pumping through the arteries is too strong. The arteries are the blood vessels that  carry blood from the heart throughout the body. Hypertension forces the heart to work harder to pump blood and may cause arteries to become narrow or stiff. Having untreated or uncontrolled hypertension can cause heart attacks, strokes, kidney disease, and other problems. A blood pressure reading consists of a higher number over a lower number. Ideally, your blood pressure should be below 120/80. The first ("top") number is called the systolic pressure. It is a measure of the pressure in your arteries as your heart beats. The second ("bottom") number is called the diastolic pressure. It is a measure of the pressure in your arteries as the heart relaxes. What are the causes? The cause of this condition is not known. What increases the risk? Some risk factors for high blood pressure are under your control. Others are not. Factors you can change  Smoking.  Having type 2 diabetes mellitus, high cholesterol, or both.  Not getting enough exercise or physical activity.  Being overweight.  Having too much fat, sugar, calories, or salt (sodium) in your diet.  Drinking too much alcohol. Factors that are difficult or impossible to change  Having chronic kidney disease.  Having a family history of high blood pressure.  Age. Risk increases with age.  Race. You may be at higher risk if you are African-American.  Gender. Men are at higher risk than women before age 68. After age 70, women are at higher risk than men.  Having obstructive sleep apnea.  Stress. What are the signs or symptoms? Extremely high blood pressure (hypertensive crisis) may cause:  Headache.  Anxiety.  Shortness of breath.  Nosebleed.  Nausea and vomiting.  Severe chest pain.  Jerky movements you cannot control (seizures).  How is this diagnosed? This condition is diagnosed by measuring your blood pressure while you are seated, with your arm resting on a surface. The cuff of the blood pressure monitor will be  placed directly against the skin of your upper arm at the level of your heart. It should be measured at least twice using the same arm. Certain conditions can cause a difference in blood pressure between your right and left arms. Certain factors can cause blood pressure readings to be lower or higher than normal (elevated) for a short period of time:  When your blood pressure is higher when you are in a health care provider's office than when you  are at home, this is called white coat hypertension. Most people with this condition do not need medicines.  When your blood pressure is higher at home than when you are in a health care provider's office, this is called masked hypertension. Most people with this condition may need medicines to control blood pressure.  If you have a high blood pressure reading during one visit or you have normal blood pressure with other risk factors:  You may be asked to return on a different day to have your blood pressure checked again.  You may be asked to monitor your blood pressure at home for 1 week or longer.  If you are diagnosed with hypertension, you may have other blood or imaging tests to help your health care provider understand your overall risk for other conditions. How is this treated? This condition is treated by making healthy lifestyle changes, such as eating healthy foods, exercising more, and reducing your alcohol intake. Your health care provider may prescribe medicine if lifestyle changes are not enough to get your blood pressure under control, and if:  Your systolic blood pressure is above 130.  Your diastolic blood pressure is above 80.  Your personal target blood pressure may vary depending on your medical conditions, your age, and other factors. Follow these instructions at home: Eating and drinking  Eat a diet that is high in fiber and potassium, and low in sodium, added sugar, and fat. An example eating plan is called the DASH (Dietary  Approaches to Stop Hypertension) diet. To eat this way: ? Eat plenty of fresh fruits and vegetables. Try to fill half of your plate at each meal with fruits and vegetables. ? Eat whole grains, such as whole wheat pasta, brown rice, or whole grain bread. Fill about one quarter of your plate with whole grains. ? Eat or drink low-fat dairy products, such as skim milk or low-fat yogurt. ? Avoid fatty cuts of meat, processed or cured meats, and poultry with skin. Fill about one quarter of your plate with lean proteins, such as fish, chicken without skin, beans, eggs, and tofu. ? Avoid premade and processed foods. These tend to be higher in sodium, added sugar, and fat.  Reduce your daily sodium intake. Most people with hypertension should eat less than 1,500 mg of sodium a day.  Limit alcohol intake to no more than 1 drink a day for nonpregnant women and 2 drinks a day for men. One drink equals 12 oz of beer, 5 oz of wine, or 1 oz of hard liquor. Lifestyle  Work with your health care provider to maintain a healthy body weight or to lose weight. Ask what an ideal weight is for you.  Get at least 30 minutes of exercise that causes your heart to beat faster (aerobic exercise) most days of the week. Activities may include walking, swimming, or biking.  Include exercise to strengthen your muscles (resistance exercise), such as pilates or lifting weights, as part of your weekly exercise routine. Try to do these types of exercises for 30 minutes at least 3 days a week.  Do not use any products that contain nicotine or tobacco, such as cigarettes and e-cigarettes. If you need help quitting, ask your health care provider.  Monitor your blood pressure at home as told by your health care provider.  Keep all follow-up visits as told by your health care provider. This is important. Medicines  Take over-the-counter and prescription medicines only as told by your health care provider.  Follow directions  carefully. Blood pressure medicines must be taken as prescribed.  Do not skip doses of blood pressure medicine. Doing this puts you at risk for problems and can make the medicine less effective.  Ask your health care provider about side effects or reactions to medicines that you should watch for. Contact a health care provider if:  You think you are having a reaction to a medicine you are taking.  You have headaches that keep coming back (recurring).  You feel dizzy.  You have swelling in your ankles.  You have trouble with your vision. Get help right away if:  You develop a severe headache or confusion.  You have unusual weakness or numbness.  You feel faint.  You have severe pain in your chest or abdomen.  You vomit repeatedly.  You have trouble breathing. Summary  Hypertension is when the force of blood pumping through your arteries is too strong. If this condition is not controlled, it may put you at risk for serious complications.  Your personal target blood pressure may vary depending on your medical conditions, your age, and other factors. For most people, a normal blood pressure is less than 120/80.  Hypertension is treated with lifestyle changes, medicines, or a combination of both. Lifestyle changes include weight loss, eating a healthy, low-sodium diet, exercising more, and limiting alcohol. This information is not intended to replace advice given to you by your health care provider. Make sure you discuss any questions you have with your health care provider. Document Released: 11/03/2005 Document Revised: 10/01/2016 Document Reviewed: 10/01/2016 Elsevier Interactive Patient Education  2018 [employer].  Health Risks of Smoking Smoking cigarettes is very bad for your health. Tobacco smoke has over 200 known poisons in it. It contains the poisonous gases nitrogen oxide and carbon monoxide. There are over 60 chemicals in tobacco smoke that cause cancer. Smoking is  difficult to quit because a chemical in tobacco, called nicotine, causes addiction or dependence. When you smoke and inhale, nicotine is absorbed rapidly into the bloodstream through your lungs. Both inhaled and non-inhaled nicotine may be addictive. What are the risks of cigarette smoke? Cigarette smokers have an increased risk of many serious medical problems, including:  Lung cancer.  Lung disease, such as pneumonia, bronchitis, and emphysema.  Chest pain (angina) and heart attack because the heart is not getting enough oxygen.  Heart disease and peripheral blood vessel disease.  High blood pressure (hypertension).  Stroke.  Oral cancer, including cancer of the lip, mouth, or voice box.  Bladder cancer.  Pancreatic cancer.  Cervical cancer.  Pregnancy complications, including premature birth.  Stillbirths and smaller newborn babies, birth defects, and genetic damage to sperm.  Early menopause.  Lower estrogen level for women.  Infertility.  Facial wrinkles.  Blindness.  Increased risk of broken bones (fractures).  Senile dementia.  Stomach ulcers and internal bleeding.  Delayed wound healing and increased risk of complications during surgery.  Even smoking lightly shortens your life expectancy by several years.  Because of secondhand smoke exposure, children of smokers have an increased risk of the following:  Sudden infant death syndrome (SIDS).  Respiratory infections.  Lung cancer.  Heart disease.  Ear infections.  What are the benefits of quitting? There are many health benefits of quitting smoking. Here are some of them:  Within days of quitting smoking, your risk of having a heart attack decreases, your blood flow improves, and your lung capacity improves. Blood pressure, pulse rate, and breathing patterns  start returning to normal soon after quitting.  Within months, your lungs may clear up completely.  Quitting for 10 years reduces your  risk of developing lung cancer and heart disease to almost that of a nonsmoker.  People who quit may see an improvement in their overall quality of life.  How do I quit smoking? Smoking is an addiction with both physical and psychological effects, and longtime habits can be hard to change. Your health care provider can recommend:  Programs and community resources, which may include group support, education, or talk therapy.  Prescription medicines to help reduce cravings.  Nicotine replacement products, such as patches, gum, and nasal sprays. Use these products only as directed. Do not replace cigarette smoking with electronic cigarettes, which are commonly called e-cigarettes. The safety of e-cigarettes is not known, and some may contain harmful chemicals.  A combination of two or more of these methods.  Where to find more information:  American Lung Association: www.lung.org  American Cancer Society: www.cancer.org Summary  Smoking cigarettes is very bad for your health. Cigarette smokers have an increased risk of many serious medical problems, including several cancers, heart disease, and stroke.  Smoking is an addiction with both physical and psychological effects, and longtime habits can be hard to change.  By stopping right away, you can greatly reduce the risk of medical problems for you and your family.  To help you quit smoking, your health care provider can recommend programs, community resources, prescription medicines, and nicotine replacement products such as patches, gum, and nasal sprays. This information is not intended to replace advice given to you by your health care provider. Make sure you discuss any questions you have with your health care provider. Document Released: 12/11/2004 Document Revised: 11/07/2016 Document Reviewed: 11/07/2016 Elsevier Interactive Patient Education  2017 [employer]

## 2017-12-22 NOTE — Progress Notes (Signed)
Assessment & Plan:  Shirley Terrell was seen today for follow-up.  Diagnoses and all orders for this visit:  Essential hypertension -     hydrochlorothiazide (HYDRODIURIL) 25 MG tablet; Take 1 tablet (25 mg total) by mouth daily. -     CBC -     Basic metabolic panel -     Lipid panel Continue all antihypertensives as prescribed.  Remember to bring in your blood pressure log with you for your follow up appointment.  DASH/Mediterranean Diets are healthier choices for HTN.   Tobacco dependence Shirley Terrell was counseled on the dangers of tobacco use, and was advised to quit. Reviewed strategies to maximize success, including removing cigarettes and smoking materials from environment, stress management and support of family/friends as well as pharmacological alternatives including: Wellbutrin, Chantix, Nicotine patch, Nicotine gum or lozenges. Smoking cessation support: smoking cessation hotline: 1-800-QUIT-NOW.  Smoking cessation classes are also available through [facility]. Call [phone] or visit our website at [email or web address].   Spent 3 minutes counseling on smoking cessation and patient is ready to quit.  Patient has been counseled on age-appropriate routine health concerns for screening and prevention. These are reviewed and up-to-date. Referrals have been placed accordingly. Immunizations are up-to-date or declined.    Subjective:   Chief Complaint  Patient presents with  . Follow-up    Patient is  here for follow-up, blood pressure check, and patient is currently fasting for labs.    HPI Shirley Terrell 32 y.o. female presents to office today for blood pressure recheck today.  She has ambulatory surgery scheduled next week for laparoscopic ventral hernia repair.   Essential Hypertension Blood pressure is poorly controlled. She was restarted on coreg 12.5mg  BID on 12-04-2017. Although she endorses medication compliance her blood pressure continues to  elevated. Will start her on HCTZ today. Denies chest pain, shortness of breath, palpitations, lightheadedness, dizziness, headaches or BLE edema.  BP Readings from Last 3 Encounters:  12/22/17 (!) 149/105  12/04/17 (!) 150/103  11/02/17 (!) 168/102    Tobacco Dependence She is weaning herself from smoking. Takes her 3 days to smoke 3 cigarettes. She declines any smoking cessation aids today.    Review of Systems  Constitutional: Negative for fever, malaise/fatigue and weight loss.  HENT: Negative.  Negative for nosebleeds.   Eyes: Negative.  Negative for blurred vision, double vision and photophobia.  Respiratory: Negative.  Negative for cough and shortness of breath.   Cardiovascular: Negative.  Negative for chest pain, palpitations and leg swelling.  Gastrointestinal: Negative.  Negative for abdominal pain, constipation, diarrhea, heartburn, nausea and vomiting.  Musculoskeletal: Negative.  Negative for myalgias.  Neurological: Negative.  Negative for dizziness, focal weakness, seizures and headaches.  Endo/Heme/Allergies: Negative for environmental allergies.  Psychiatric/Behavioral: Negative.  Negative for suicidal ideas.    Past Medical History:  Diagnosis Date  . Anemia    takes iron supplement  . Asthma    prn inhaler  . Hypertension    states BP normalized while pregnant and since delivery; is currently not on med.  . Lisfranc dislocation 11/30/2016   left    Past Surgical History:  Procedure Laterality Date  . CHOLECYSTECTOMY  04/13/2012   Procedure: LAPAROSCOPIC CHOLECYSTECTOMY WITH INTRAOPERATIVE CHOLANGIOGRAM;  Surgeon: Adolph Pollackodd J Rosenbower, MD;  [facility];  Service: General;  Laterality: N/A;  laparoscopic cholecystectomy with IOC  . OPEN REDUCTION INTERNAL FIXATION (ORIF) FOOT LISFRANC FRACTURE Left 12/18/2016   Procedure: OPEN REDUCTION INTERNAL FIXATION (ORIF) FOOT LISFRANC  FRACTURE;  Surgeon: Toni Arthurs, MD;  Location: [facility];  Service:  Orthopedics;  Laterality: Left;    Family History  Problem Relation Age of Onset  . Hypertension Mother   . Stroke Mother   . Heart disease Father   . Depression Father   . Cancer Paternal Grandmother        breast  . Diabetes Paternal Grandmother   . Cancer Paternal Grandfather   . Hypertension Maternal Grandmother   . Diabetes Other     Social History Reviewed with no changes to be made today.   Outpatient Medications Prior to Visit  Medication Sig Dispense Refill  . albuterol (PROVENTIL HFA;VENTOLIN HFA) 108 (90 BASE) MCG/ACT inhaler Inhale 2 puffs into the lungs every 6 (six) hours as needed for wheezing. Reported on 04/11/2016    . carvedilol (COREG) 12.5 MG tablet Take 1 tablet (12.5 mg total) by mouth 2 (two) times daily with a meal. 60 tablet 3  . Ferrous Sulfate (IRON SUPPLEMENT PO) Take 1 tablet by mouth daily.     Marland Kitchen acetaminophen (TYLENOL) 500 MG tablet Take 1,000 mg by mouth every 6 (six) hours as needed for moderate pain or headache.     No facility-administered medications prior to visit.     No Known Allergies     Objective:    BP (!) 149/105 (BP Location: Right Arm, Cuff Size: Large)   Pulse 87   Temp 98.4 F (36.9 C) (Oral)   Ht 5\' 3"  (1.6 m)   Wt 228 lb 9.6 oz (103.7 kg)   SpO2 97%   BMI 40.49 kg/m  Wt Readings from Last 3 Encounters:  12/22/17 228 lb 9.6 oz (103.7 kg)  12/04/17 227 lb 6.4 oz (103.1 kg)  11/02/17 225 lb (102.1 kg)    Physical Exam  Constitutional: She is oriented to person, place, and time. She appears well-developed and well-nourished. She is cooperative.  HENT:  Head: Normocephalic and atraumatic.  Eyes: EOM are normal.  Neck: Normal range of motion.  Cardiovascular: Normal rate, regular rhythm and normal heart sounds. Exam reveals no gallop and no friction rub.  No murmur heard. Pulmonary/Chest: Effort normal and breath sounds normal. No tachypnea. No respiratory distress. She has no decreased breath sounds. She has no  wheezes. She has no rhonchi. She has no rales. She exhibits no tenderness.  Abdominal: Soft. Bowel sounds are normal.  Musculoskeletal: Normal range of motion. She exhibits no edema.  Neurological: She is alert and oriented to person, place, and time. Coordination normal.  Skin: Skin is warm and dry.  Psychiatric: She has a normal mood and affect. Her behavior is normal. Judgment and thought content normal.  Nursing note and vitals reviewed.      Patient has been counseled extensively about nutrition and exercise as well as the importance of adherence with medications and regular follow-up. The patient was given clear instructions to go to ER or return to medical center if symptoms don't improve, worsen or new problems develop. The patient verbalized understanding.   Follow-up: Return in about 3 weeks (around 01/12/2018) for BP recheck.   Claiborne Rigg, FNP-BC [facility] [place], [place] [phone]   12/22/2017, 12:41 PM

## 2017-12-23 LAB — CBC
Hematocrit: 41.2 % (ref 34.0–46.6)
Hemoglobin: 13.8 g/dL (ref 11.1–15.9)
MCH: 31.2 pg (ref 26.6–33.0)
MCHC: 33.5 g/dL (ref 31.5–35.7)
MCV: 93 fL (ref 79–97)
PLATELETS: 278 10*3/uL (ref 150–379)
RBC: 4.43 x10E6/uL (ref 3.77–5.28)
RDW: 13.1 % (ref 12.3–15.4)
WBC: 9.7 10*3/uL (ref 3.4–10.8)

## 2017-12-23 LAB — LIPID PANEL
CHOLESTEROL TOTAL: 139 mg/dL (ref 100–199)
Chol/HDL Ratio: 2.7 ratio (ref 0.0–4.4)
HDL: 51 mg/dL (ref >39)
LDL Calculated: 76 mg/dL (ref 0–99)
Triglycerides: 60 mg/dL (ref 0–149)
VLDL CHOLESTEROL CAL: 12 mg/dL (ref 5–40)

## 2017-12-23 LAB — BASIC METABOLIC PANEL
BUN / CREAT RATIO: 15 (ref 9–23)
BUN: 10 mg/dL (ref 6–20)
CALCIUM: 9.3 mg/dL (ref 8.7–10.2)
CHLORIDE: 103 mmol/L (ref 96–106)
CO2: 21 mmol/L (ref 20–29)
Creatinine, Ser: 0.67 mg/dL (ref 0.57–1.00)
GFR calc Af Amer: 137 mL/min/{1.73_m2} (ref >59)
GFR calc non Af Amer: 119 mL/min/{1.73_m2} (ref >59)
GLUCOSE: 91 mg/dL (ref 65–99)
Potassium: 4.3 mmol/L (ref 3.5–5.2)
Sodium: 139 mmol/L (ref 134–144)

## 2017-12-25 NOTE — Patient Instructions (Addendum)
Anna GenreJessica L Allers  12/25/2017   Your procedure is scheduled ZO:XWRUEAon:Friday 01/01/2018   [facility]              Follow signs to Short Stay on first floor at 530 AM   Call this number if you have problems the morning of surgery [phone]    Remember: Do not eat food or drink liquids :After Midnight.     Take these medicines the morning of surgery with A SIP OF WATER:  use Albuterol inhaler if needed and bring it with you to the hospital              You may not have any metal on your body including hair pins and              piercings  Do not wear jewelry, make-up, lotions, powders or perfumes, deodorant             Do not wear nail polish.  Do not shave  48 hours prior to surgery.              Men may shave face and neck.   Do not bring valuables to the hospital. [facility] IS NOT             RESPONSIBLE   FOR VALUABLES.  Contacts, dentures or bridgework may not be worn into surgery.  Leave suitcase in the car. After surgery it may be brought to your room.     Patients discharged the day of surgery will not be allowed to drive home.  Name and phone number of your driver: sister- Lucia EstelleBrittany Bina or Renaye Rakersesira Hewes  Special Instructions:              Please read over the following fact sheets you were given: _____________________________________________________________________             [facility] - Preparing for Surgery Before surgery, you can play an important role.  Because skin is not sterile, your skin needs to be as free of germs as possible.  You can reduce the number of germs on your skin by washing with CHG (chlorahexidine gluconate) soap before surgery.  CHG is an antiseptic cleaner which kills germs and bonds with the skin to continue killing germs even after washing. Please DO NOT use if you have an allergy to CHG or antibacterial soaps.  If your skin becomes reddened/irritated stop using the CHG and inform your nurse  when you arrive at Short Stay. Do not shave (including legs and underarms) for at least 48 hours prior to the first CHG shower.  You may shave your face/neck. Please follow these instructions carefully:  1.  Shower with CHG Soap the night before surgery and the  morning of Surgery.  2.  If you choose to wash your hair, wash your hair first as usual with your  normal  shampoo.  3.  After you shampoo, rinse your hair and body thoroughly to remove the  shampoo.                           4.  Use CHG as you would any other liquid soap.  You can apply chg directly  to the skin and wash  Gently with a scrungie or clean washcloth.  5.  Apply the CHG Soap to your body ONLY FROM THE NECK DOWN.   Do not use on face/ open                           Wound or open sores. Avoid contact with eyes, ears mouth and genitals (private parts).                       Wash face,  Genitals (private parts) with your normal soap.             6.  Wash thoroughly, paying special attention to the area where your surgery  will be performed.  7.  Thoroughly rinse your body with warm water from the neck down.  8.  DO NOT shower/wash with your normal soap after using and rinsing off  the CHG Soap.                9.  Pat yourself dry with a clean towel.            10.  Wear clean pajamas.            11.  Place clean sheets on your bed the night of your first shower and do not  sleep with pets. Day of Surgery : Do not apply any lotions/deodorants the morning of surgery.  Please wear clean clothes to the hospital/surgery center.  FAILURE TO FOLLOW THESE INSTRUCTIONS MAY RESULT IN THE CANCELLATION OF YOUR SURGERY PATIENT SIGNATURE_________________________________  NURSE SIGNATURE__________________________________  ________________________________________________________________________

## 2017-12-28 ENCOUNTER — Encounter (HOSPITAL_COMMUNITY): Payer: Self-pay

## 2017-12-28 ENCOUNTER — Other Ambulatory Visit: Payer: Self-pay

## 2017-12-28 ENCOUNTER — Other Ambulatory Visit: Payer: Self-pay | Admitting: Nurse Practitioner

## 2017-12-28 ENCOUNTER — Encounter (HOSPITAL_COMMUNITY)
Admission: RE | Admit: 2017-12-28 | Discharge: 2017-12-28 | Disposition: A | Discharge status: Home / Self Care | Payer: Medicaid Other | Source: Ambulatory Visit | Attending: Surgery | Admitting: Surgery

## 2017-12-28 DIAGNOSIS — I10 Essential (primary) hypertension: Secondary | ICD-10-CM | POA: Insufficient documentation

## 2017-12-28 DIAGNOSIS — G4730 Sleep apnea, unspecified: Secondary | ICD-10-CM

## 2017-12-28 DIAGNOSIS — Z7189 Other specified counseling: Secondary | ICD-10-CM

## 2017-12-28 DIAGNOSIS — Z01812 Encounter for preprocedural laboratory examination: Secondary | ICD-10-CM | POA: Diagnosis present

## 2017-12-28 DIAGNOSIS — Z01810 Encounter for preprocedural cardiovascular examination: Secondary | ICD-10-CM | POA: Diagnosis not present

## 2017-12-28 LAB — CBC WITH DIFFERENTIAL/PLATELET
Basophils Absolute: 0 10*3/uL (ref 0.0–0.1)
Basophils Relative: 0 %
EOS ABS: 0.1 10*3/uL (ref 0.0–0.7)
EOS PCT: 1 %
HCT: 41.3 % (ref 36.0–46.0)
Hemoglobin: 13.9 g/dL (ref 12.0–15.0)
LYMPHS PCT: 19 %
Lymphs Abs: 2 10*3/uL (ref 0.7–4.0)
MCH: 31.2 pg (ref 26.0–34.0)
MCHC: 33.7 g/dL (ref 30.0–36.0)
MCV: 92.8 fL (ref 78.0–100.0)
MONO ABS: 0.7 10*3/uL (ref 0.1–1.0)
MONOS PCT: 7 %
Neutro Abs: 7.5 10*3/uL (ref 1.7–7.7)
Neutrophils Relative %: 73 %
Platelets: 264 10*3/uL (ref 150–400)
RBC: 4.45 MIL/uL (ref 3.87–5.11)
RDW: 12.7 % (ref 11.5–15.5)
WBC: 10.3 10*3/uL (ref 4.0–10.5)

## 2017-12-28 LAB — BASIC METABOLIC PANEL WITH GFR
Anion gap: 9 (ref 5–15)
BUN: 15 mg/dL (ref 6–20)
CO2: 24 mmol/L (ref 22–32)
Calcium: 9.2 mg/dL (ref 8.9–10.3)
Chloride: 104 mmol/L (ref 101–111)
Creatinine, Ser: 0.66 mg/dL (ref 0.44–1.00)
GFR calc Af Amer: >60 mL/min
GFR calc non Af Amer: >60 mL/min
Glucose, Bld: 99 mg/dL (ref 65–99)
Potassium: 4.2 mmol/L (ref 3.5–5.1)
Sodium: 137 mmol/L (ref 135–145)

## 2017-12-28 LAB — HCG, SERUM, QUALITATIVE: Preg, Serum: NEGATIVE

## 2017-12-28 NOTE — Progress Notes (Signed)
Consulted with Dr. Eilene GhaziGeorge Rose, [facility] face to face about patient's EKG from 12/28/2017 and reviewed her medical history with him. I informed him that today her BP was, 148/106, 157/112, after 20 minutes sitting-153/108. Per Dr. Eilene GhaziGeorge Rose , patient needs Cardiac Clearance and Blood Pressure needs to be under control for surgery.

## 2017-12-28 NOTE — Progress Notes (Signed)
Called Triage at [facility] and spoke to [place] and informed her that Dr. Okey Dupreose requested a Cardiac Clearance on this patient before surgery to get a better control of Blood pressure. She verbalized understanding.

## 2017-12-28 NOTE — Progress Notes (Signed)
   12/28/17 1019  OBSTRUCTIVE SLEEP APNEA  Have you ever been diagnosed with sleep apnea through a sleep study? No  Do you snore loudly (loud enough to be heard through closed doors)?  1  Do you often feel tired, fatigued, or sleepy during the daytime (such as falling asleep during driving or talking to someone)? 1  Has anyone observed you stop breathing during your sleep? 0  Do you have, or are you being treated for high blood pressure? 1  BMI more than 35 kg/m2? 1  Age > 50 (1-yes) 0  Neck circumference greater than:Female 16 inches or larger, Female 17inches or larger? 1  Female Gender (Yes=1) 0  Obstructive Sleep Apnea Score 5  Score 5 or greater  Results sent to PCP

## 2018-01-12 ENCOUNTER — Encounter: Payer: Self-pay | Admitting: Nurse Practitioner

## 2018-01-12 ENCOUNTER — Ambulatory Visit: Payer: Medicaid Other | Attending: Nurse Practitioner | Admitting: Nurse Practitioner

## 2018-01-12 VITALS — BP 134/95 | HR 110 | Temp 98.3°F | Ht 63.0 in | Wt 226.8 lb

## 2018-01-12 DIAGNOSIS — F17200 Nicotine dependence, unspecified, uncomplicated: Secondary | ICD-10-CM | POA: Insufficient documentation

## 2018-01-12 DIAGNOSIS — I10 Essential (primary) hypertension: Secondary | ICD-10-CM | POA: Insufficient documentation

## 2018-01-12 DIAGNOSIS — Z6841 Body Mass Index (BMI) 40.0 and over, adult: Secondary | ICD-10-CM | POA: Diagnosis not present

## 2018-01-12 DIAGNOSIS — J45909 Unspecified asthma, uncomplicated: Secondary | ICD-10-CM | POA: Insufficient documentation

## 2018-01-12 DIAGNOSIS — Z79899 Other long term (current) drug therapy: Secondary | ICD-10-CM | POA: Insufficient documentation

## 2018-01-12 DIAGNOSIS — D649 Anemia, unspecified: Secondary | ICD-10-CM | POA: Insufficient documentation

## 2018-01-12 DIAGNOSIS — R51 Headache: Secondary | ICD-10-CM | POA: Diagnosis present

## 2018-01-12 MED ORDER — NICOTINE 14 MG/24HR TD PT24: 14.0000 mg | MEDICATED_PATCH | Freq: Every day | TRANSDERMAL | 0 refills | Status: DC

## 2018-01-12 MED ORDER — NICOTINE 7 MG/24HR TD PT24: 7.0000 mg | MEDICATED_PATCH | Freq: Every day | TRANSDERMAL | 0 refills | Status: DC

## 2018-01-12 MED ORDER — HYDROCHLOROTHIAZIDE 25 MG PO TABS: 25.0000 mg | ORAL_TABLET | Freq: Every day | ORAL | 3 refills | Status: DC

## 2018-01-12 MED ORDER — CARVEDILOL 12.5 MG PO TABS: 12.5000 mg | ORAL_TABLET | Freq: Two times a day (BID) | ORAL | 3 refills | Status: DC

## 2018-01-12 NOTE — Patient Instructions (Addendum)
Sleep Apnea Sleep apnea is a condition in which breathing pauses or becomes shallow during sleep. Episodes of sleep apnea usually last 10 seconds or longer, and they may occur as many as 20 times an hour. Sleep apnea disrupts your sleep and keeps your body from getting the rest that it needs. This condition can increase your risk of certain health problems, including:  Heart attack.  Stroke.  Obesity.  Diabetes.  Heart failure.  Irregular heartbeat.  There are three kinds of sleep apnea:  Obstructive sleep apnea. This kind is caused by a blocked or collapsed airway.  Central sleep apnea. This kind happens when the part of the brain that controls breathing does not send the correct signals to the muscles that control breathing.  Mixed sleep apnea. This is a combination of obstructive and central sleep apnea.  What are the causes? The most common cause of this condition is a collapsed or blocked airway. An airway can collapse or become blocked if:  Your throat muscles are abnormally relaxed.  Your tongue and tonsils are larger than normal.  You are overweight.  Your airway is smaller than normal.  What increases the risk? This condition is more likely to develop in people who:  Are overweight.  Smoke.  Have a smaller than normal airway.  Are elderly.  Are female.  Drink alcohol.  Take sedatives or tranquilizers.  Have a family history of sleep apnea.  What are the signs or symptoms? Symptoms of this condition include:  Trouble staying asleep.  Daytime sleepiness and tiredness.  Irritability.  Loud snoring.  Morning headaches.  Trouble concentrating.  Forgetfulness.  Decreased interest in sex.  Unexplained sleepiness.  Mood swings.  Personality changes.  Feelings of depression.  Waking up often during the night to urinate.  Dry mouth.  Sore throat.  How is this diagnosed? This condition may be diagnosed with:  A medical history.  A  physical exam.  A series of tests that are done while you are sleeping (sleep study). These tests are usually done in a sleep lab, but they may also be done at home.  How is this treated? Treatment for this condition aims to restore normal breathing and to ease symptoms during sleep. It may involve managing health issues that can affect breathing, such as high blood pressure or obesity. Treatment may include:  Sleeping on your side.  Using a decongestant if you have nasal congestion.  Avoiding the use of depressants, including alcohol, sedatives, and narcotics.  Losing weight if you are overweight.  Making changes to your diet.  Quitting smoking.  Using a device to open your airway while you sleep, such as: ? An oral appliance. This is a custom-made mouthpiece that shifts your lower jaw forward. ? A continuous positive airway pressure (CPAP) device. This device delivers oxygen to your airway through a mask. ? A nasal expiratory positive airway pressure (EPAP) device. This device has valves that you put into each nostril. ? A bi-level positive airway pressure (BPAP) device. This device delivers oxygen to your airway through a mask.  Surgery if other treatments do not work. During surgery, excess tissue is removed to create a wider airway.  It is important to get treatment for sleep apnea. Without treatment, this condition can lead to:  High blood pressure.  Coronary artery disease.  (Men) An inability to achieve or maintain an erection (impotence).  Reduced thinking abilities.  Follow these instructions at home:  Make any lifestyle changes that your health   care provider recommends.  Eat a healthy, well-balanced diet.  Take over-the-counter and prescription medicines only as told by your health care provider.  Avoid using depressants, including alcohol, sedatives, and narcotics.  Take steps to lose weight if you are overweight.  If you were given a device to open your  airway while you sleep, use it only as told by your health care provider.  Do not use any tobacco products, such as cigarettes, chewing tobacco, and e-cigarettes. If you need help quitting, ask your health care provider.  Keep all follow-up visits as told by your health care provider. This is important. Contact a health care provider if:  The device that you received to open your airway during sleep is uncomfortable or does not seem to be working.  Your symptoms do not improve.  Your symptoms get worse. Get help right away if:  You develop chest pain.  You develop shortness of breath.  You develop discomfort in your back, arms, or stomach.  You have trouble speaking.  You have weakness on one side of your body.  You have drooping in your face. These symptoms may represent a serious problem that is an emergency. Do not wait to see if the symptoms will go away. Get medical help right away. Call your local emergency services (911 in the [place] Do not drive yourself to the hospital. This information is not intended to replace advice given to you by your health care provider. Make sure you discuss any questions you have with your health care provider. Document Released: 10/24/2002 Document Revised: 06/29/2016 Document Reviewed: 08/13/2015 Elsevier Interactive Patient Education  2018 [employer].  Caffeine Withdrawal Caffeine withdrawal is a group of symptoms that can develop when a person who consumes a lot of caffeine every day suddenly stops or greatly reduces his or her caffeine intake. Caffeine is a drug that is usually found in coffee, tea, soda, cocoa, chocolate milk, chocolate, and some over-the-counter pain relievers and medicines. If you consume too much caffeine for a long period of time, you may go through caffeine withdrawal when you stop or reduce your intake. What are the causes? This condition is caused by having less caffeine than your body is used to having. What  increases the risk? The following factors may make you more likely to develop this condition:  Having a history of other substance use disorders.  Having a history of a mood disorder, anxiety disorder, psychiatric disorder, or eating disorder.  Being in a situation that restricts caffeine use, such as pregnancy, fasting, medical procedures, or hospitalization.  Using energy drinks.  What are the signs or symptoms? Symptoms of this condition include:  Feeling more tired than usual.  Headaches.  Having trouble concentrating or staying alert.  Feeling irritable.  Feeling like you have the flu.  Craving caffeine.  Depression.  Nausea or vomiting.  Joint stiffness or aching muscles.  Your symptoms may be more or less severe, depending on how much caffeine you consume or have been consuming over time. How is this diagnosed? This condition is usually diagnosed based on your symptoms and your recent history of caffeine use. Your health care provider may ask you about any history of stimulant abuse or use of other substances. In some cases, you may be asked to use a food journal to keep track of how much caffeine you have every day. How is this treated? Treatment for this condition will focus on addressing the symptoms. Your health care provider may recommend that  you:  Consume more caffeine at first to help end the withdrawal symptoms.  Slowly reduce your caffeine use over time to avoid symptoms of caffeine withdrawal while removing it from your diet. Your health care provider can help you decide if you want to limit (cut back on) or eliminate caffeine from your diet.  Other treatments may be recommended to help with any underlying reasons for your high caffeine use. Your health care provider may also recommend techniques to manage stress. Follow these instructions at home:  Do not stop having caffeine all at once. Doing that may cause severe withdrawal symptoms.  To avoid  withdrawal symptoms, do not have more than 50 mg of caffeine-equal to  cup of coffee-in one day.  Cut back on caffeine slowly over time as directed by your health care provider. For example, try mixing a caffeinated soda with a decaf (decaffeinated) soda.  Try replacing coffee, tea, or soda with a decaf drink.  Find ways to manage stress, such as by: ? Meditating. ? Being more active. ? Using deep breathing exercises. Contact a health care provider if:  Your headaches or other withdrawal symptoms do not go away after several days of reduced usage, or they do not go away after you start using caffeine again. Get help right away if:  You feel depressed or have suicidal thoughts.  You are vomiting or have severe dehydration. If you ever feel like you may hurt yourself or others, or have thoughts about taking your own life, get help right away. You can go to your nearest emergency department or call:  Your local emergency services (911 in the [place]  A suicide crisis helpline, such as the National Suicide Prevention Lifeline at (848) 569-94541-201 192 9194. This is open 24 hours a day.  Summary  Caffeine withdrawal is a group of symptoms that can develop when a person who consumes a lot of caffeine every day suddenly stops or greatly reduces his or her caffeine intake.  Your withdrawal symptoms may be more or less severe, depending on how much caffeine you consume or have been consuming over time.  Your health care provider can help you decide if you want to limit (cut back on) or eliminate caffeine from your diet.  To avoid withdrawal symptoms, do not have more than 50 mg of caffeine-equal to  cup of coffee-in one day. This information is not intended to replace advice given to you by your health care provider. Make sure you discuss any questions you have with your health care provider. Document Released: 02/26/2017 Document Revised: 02/26/2017 Document Reviewed: 02/26/2017 Elsevier Interactive  Patient Education  2018 [employer]  Analgesic Rebound Headache An analgesic rebound headache, sometimes called a medication overuse headache, is a headache that comes after pain medicine (analgesic) taken to treat the original (primary) headache has worn off. Any type of primary headache can return as a rebound headache if a person regularly takes analgesics more than three times a week to treat it. The types of primary headaches that are commonly associated with rebound headaches include:  Migraines.  Headaches that arise from tense muscles in the head and neck area (tension headaches).  Headaches that develop and happen again (recur) on one side of the head and around the eye (cluster headaches).  If rebound headaches continue, they become chronic daily headaches. What are the causes? This condition may be caused by frequent use of:  Over-the-counter medicines such as aspirin, ibuprofen, and acetaminophen.  Sinus relief medicines and other medicines that  contain caffeine.  Narcotic pain medicines such as codeine and oxycodone.  What are the signs or symptoms? The symptoms of a rebound headache are the same as the symptoms of the original headache. Some of the symptoms of specific types of headaches include: Migraine headache  Pulsing or throbbing pain on one or both sides of the head.  Severe pain that interferes with daily activities.  Pain that is worsened by physical activity.  Nausea, vomiting, or both.  Pain with exposure to bright light, loud noises, or strong smells.  General sensitivity to bright light, loud noises, or strong smells.  Visual changes.  Numbness of one or both arms. Tension headache  Pressure around the head.  Dull, aching head pain.  Pain felt over the front and sides of the head.  Tenderness in the muscles of the head, neck, and shoulders. Cluster headache  Severe pain that begins in or around one eye or temple.  Redness and tearing  in the eye on the same side as the pain.  Droopy or swollen eyelid.  One-sided head pain.  Nausea.  Runny nose.  Sweaty, pale facial skin.  Restlessness. How is this diagnosed? This condition is diagnosed by:  Reviewing your medical history. This includes the nature of your primary headaches.  Reviewing the types of pain medicines that you have been using to treat your headaches and how often you take them.  How is this treated? This condition may be treated or managed by:  Discontinuing frequent use of the analgesic medicine. Doing this may worsen your headaches at first, but the pain should eventually become more manageable, less frequent, and less severe.  Seeing a headache specialist. He or she may be able to help you manage your headaches and help make sure there is not another cause of the headaches.  Using methods of stress relief, such as acupuncture, counseling, biofeedback, and massage. Talk with your health care provider about which methods might be good for you.  Follow these instructions at home:  Take over-the-counter and prescription medicines only as told by your health care provider.  Stop the repeated use of pain medicine as told by your health care provider. Stopping can be difficult. Carefully follow instructions from your health care provider.  Avoid triggers that are known to cause your primary headaches.  Keep all follow-up visits as told by your health care provider. This is important. Contact a health care provider if:  You continue to experience headaches after following treatments that your health care provider recommended. Get help right away if:  You develop new headache pain.  You develop headache pain that is different than what you have experienced in the past.  You develop numbness or tingling in your arms or legs.  You develop changes in your speech or vision. This information is not intended to replace advice given to you by your  health care provider. Make sure you discuss any questions you have with your health care provider. Document Released: 01/24/2004 Document Revised: 05/23/2016 Document Reviewed: 04/07/2016 Elsevier Interactive Patient Education  2018 [employer] Inc[employer]  General Headache Without Cause A headache is pain or discomfort felt around the head or neck area. There are many causes and types of headaches. In some cases, the cause may not be found. Follow these instructions at home: Managing pain  Take over-the-counter and prescription medicines only as told by your doctor.  Lie down in a dark, quiet room when you have a headache.  If directed, apply ice to  the head and neck area: ? Put ice in a plastic bag. ? Place a towel between your skin and the bag. ? Leave the ice on for 20 minutes, 2-3 times per day.  Use a heating pad or hot shower to apply heat to the head and neck area as told by your doctor.  Keep lights dim if bright lights bother you or make your headaches worse. Eating and drinking  Eat meals on a regular schedule.  Lessen how much alcohol you drink.  Lessen how much caffeine you drink, or stop drinking caffeine. General instructions  Keep all follow-up visits as told by your doctor. This is important.  Keep a journal to find out if certain things bring on headaches. For example, write down: ? What you eat and drink. ? How much sleep you get. ? Any change to your diet or medicines.  Relax by getting a massage or doing other relaxing activities.  Lessen stress.  Sit up straight. Do not tighten (tense) your muscles.  Do not use tobacco products. This includes cigarettes, chewing tobacco, or e-cigarettes. If you need help quitting, ask your doctor.  Exercise regularly as told by your doctor.  Get enough sleep. This often means 7-9 hours of sleep. Contact a doctor if:  Your symptoms are not helped by medicine.  You have a headache that feels different than the other  headaches.  You feel sick to your stomach (nauseous) or you throw up (vomit).  You have a fever. Get help right away if:  Your headache becomes really bad.  You keep throwing up.  You have a stiff neck.  You have trouble seeing.  You have trouble speaking.  You have pain in the eye or ear.  Your muscles are weak or you lose muscle control.  You lose your balance or have trouble walking.  You feel like you will pass out (faint) or you pass out.  You have confusion. This information is not intended to replace advice given to you by your health care provider. Make sure you discuss any questions you have with your health care provider. Document Released: 08/12/2008 Document Revised: 04/10/2016 Document Reviewed: 02/26/2015 Elsevier Interactive Patient Education  2018 [employer].  Migraine Headache A migraine headache is a very strong throbbing pain on one side or both sides of your head. Migraines can also cause other symptoms. Talk with your doctor about what things may bring on (trigger) your migraine headaches. Follow these instructions at home: Medicines  Take over-the-counter and prescription medicines only as told by your doctor.  Do not drive or use heavy machinery while taking prescription pain medicine.  To prevent or treat constipation while you are taking prescription pain medicine, your doctor may recommend that you: ? Drink enough fluid to keep your pee (urine) clear or pale yellow. ? Take over-the-counter or prescription medicines. ? Eat foods that are high in fiber. These include fresh fruits and vegetables, whole grains, and beans. ? Limit foods that are high in fat and processed sugars. These include fried and sweet foods. Lifestyle  Avoid alcohol.  Do not use any products that contain nicotine or tobacco, such as cigarettes and e-cigarettes. If you need help quitting, ask your doctor.  Get at least 8 hours of sleep every night.  Limit your  stress. General instructions   Keep a journal to find out what may bring on your migraines. For example, write down: ? What you eat and drink. ? How much sleep you get. ?  Any change in what you eat or drink. ? Any change in your medicines.  If you have a migraine: ? Avoid things that make your symptoms worse, such as bright lights. ? It may help to lie down in a dark, quiet room. ? Do not drive or use heavy machinery. ? Ask your doctor what activities are safe for you.  Keep all follow-up visits as told by your doctor. This is important. Contact a doctor if:  You get a migraine that is different or worse than your usual migraines. Get help right away if:  Your migraine gets very bad.  You have a fever.  You have a stiff neck.  You have trouble seeing.  Your muscles feel weak or like you cannot control them.  You start to lose your balance a lot.  You start to have trouble walking.  You pass out (faint). This information is not intended to replace advice given to you by your health care provider. Make sure you discuss any questions you have with your health care provider. Document Released: 08/12/2008 Document Revised: 05/23/2016 Document Reviewed: 04/21/2016 Elsevier Interactive Patient Education  2018 [employer]  Tension Headache A tension headache is a feeling of pain, pressure, or aching that is often felt over the front and sides of the head. The pain can be dull, or it can feel tight (constricting). Tension headaches are not normally associated with nausea or vomiting, and they do not get worse with physical activity. Tension headaches can last from 30 minutes to several days. This is the most common type of headache. CAUSES The exact cause of this condition is not known. Tension headaches often begin after stress, anxiety, or depression. Other triggers may include:  Alcohol.  Too much caffeine, or caffeine withdrawal.  Respiratory infections, such as colds,  flu, or sinus infections.  Dental problems or teeth clenching.  Fatigue.  Holding your head and neck in the same position for a long period of time, such as while using a computer.  Smoking. SYMPTOMS Symptoms of this condition include:  A feeling of pressure around the head.  Dull, aching head pain.  Pain felt over the front and sides of the head.  Tenderness in the muscles of the head, neck, and shoulders. DIAGNOSIS This condition may be diagnosed based on your symptoms and a physical exam. Tests may be done, such as a CT scan or an MRI of your head. These tests may be done if your symptoms are severe or unusual. TREATMENT This condition may be treated with lifestyle changes and medicines to help relieve symptoms. HOME CARE INSTRUCTIONS Managing Pain  Take over-the-counter and prescription medicines only as told by your health care provider.  Lie down in a dark, quiet room when you have a headache.  If directed, apply ice to the head and neck area: ? Put ice in a plastic bag. ? Place a towel between your skin and the bag. ? Leave the ice on for 20 minutes, 2-3 times per day.  Use a heating pad or a hot shower to apply heat to the head and neck area as told by your health care provider. Eating and Drinking  Eat meals on a regular schedule.  Limit alcohol use.  Decrease your caffeine intake, or stop using caffeine. General Instructions  Keep all follow-up visits as told by your health care provider. This is important.  Keep a headache journal to help find out what may trigger your headaches. For example, write down: ?  What you eat and drink. ? How much sleep you get. ? Any change to your diet or medicines.  Try massage or other relaxation techniques.  Limit stress.  Sit up straight, and avoid tensing your muscles.  Do not use tobacco products, including cigarettes, chewing tobacco, or e-cigarettes. If you need help quitting, ask your health care  provider.  Exercise regularly as told by your health care provider.  Get 7-9 hours of sleep, or the amount recommended by your health care provider. SEEK MEDICAL CARE IF:  Your symptoms are not helped by medicine.  You have a headache that is different from what you normally experience.  You have nausea or you vomit.  You have a fever. SEEK IMMEDIATE MEDICAL CARE IF:  Your headache becomes severe.  You have repeated vomiting.  You have a stiff neck.  You have a loss of vision.  You have problems with speech.  You have pain in your eye or ear.  You have muscular weakness or loss of muscle control.  You lose your balance or you have trouble walking.  You feel faint or you pass out.  You have confusion. This information is not intended to replace advice given to you by your health care provider. Make sure you discuss any questions you have with your health care provider. Document Released: 11/03/2005 Document Revised: 07/25/2015 Document Reviewed: 02/26/2015 Elsevier Interactive Patient Education  2017 [employer].

## 2018-01-12 NOTE — Progress Notes (Signed)
Assessment & Plan:  Shirley Terrell was seen today for blood pressure check and headache.  Diagnoses and all orders for this visit:  Essential hypertension -     carvedilol (COREG) 12.5 MG tablet; Take 1 tablet (12.5 mg total) by mouth 2 (two) times daily with a meal. -     hydrochlorothiazide (HYDRODIURIL) 25 MG tablet; Take 1 tablet (25 mg total) by mouth daily. Continue all antihypertensives as prescribed.  Remember to bring in your blood pressure log with you for your follow up appointment.  DASH/Mediterranean Diets are healthier choices for HTN.    Tobacco dependence -     nicotine (NICODERM CQ - DOSED IN MG/24 HOURS) 14 mg/24hr patch; Place 1 patch (14 mg total) onto the skin daily. -     nicotine (NICODERM CQ - DOSED IN MG/24 HR) 7 mg/24hr patch; Place 1 patch (7 mg total) onto the skin daily. [name] was counseled on the dangers of tobacco use, and was advised to quit. Reviewed strategies to maximize success, including removing cigarettes and smoking materials from environment, stress management and support of family/friends as well as pharmacological alternatives including: Wellbutrin, Chantix, Nicotine patch, Nicotine gum or lozenges. Smoking cessation support: smoking cessation hotline: 1-800-QUIT-NOW.  Smoking cessation classes are also available through [facility]. Call [phone] or visit our website at [email or web address].   Spent 5 minutes counseling on smoking cessation and patient is ready to quit.   Morbid obesity with BMI of 40.0-44.9, adult (HCC) Discussed diet and exercise for person with BMI >25. Instructed: You must burn more calories than you eat. Losing 5 percent of your body weight should be considered a success. In the longer term, losing more than 15 percent of your body weight and staying at this weight is an extremely good result. However, keep in mind that even losing 5 percent of your body weight leads to important health benefits, so  try not to get discouraged if you're not able to lose more than this. Will recheck weight in 3-6 months.    Patient has been counseled on age-appropriate routine health concerns for screening and prevention. These are reviewed and up-to-date. Referrals have been placed accordingly. Immunizations are up-to-date or declined.    Subjective:   Chief Complaint  Patient presents with  . Blood Pressure Check    Patient is here for a blood pressure recheck.  . Headache    Patient stated she wake up with a headache and sleep with a headache.    HPI Shirley Terrell 32 y.o. female presents to office today for follow up. She has been scheduled for ventral hernia repair with mesh. There has been a delay in scheduling due to poorly controlled BP. She was seen in my office a few weeks ago and was started on HCTZ 25mg  daily. Unfortunately she was unaware that she was to continue her carvedilol 12.5mg  BID at that time as well. Today her blood pressure is not at goal. She has been taking the HCTZ as prescribed but missing Coreg. She has been instructed to continue both as the beta blocker will not only help lower her heart rate but her blood pressure as well.   Essential Hypertension Improving but not yet controlled. She denies chest pain, shortness of breath, palpitations, lightheadedness, dizziness or BLE edema. She does endorses frequent headaches unresponsive to acetaminophen. I have instructed her to start a headache diary, reduce caffeine and try excedrin migraine. She is agreeable. BP Readings from Last 3 Encounters:  01/12/18 (!) 134/95  12/28/17 (!) 148/106  12/22/17 (!) 149/105    Review of Systems  Constitutional: Negative for fever, malaise/fatigue and weight loss.  HENT: Negative.  Negative for nosebleeds.   Eyes: Negative.  Negative for blurred vision, double vision and photophobia.  Respiratory: Negative.  Negative for cough and shortness of breath.   Cardiovascular: Negative.  Negative  for chest pain, palpitations and leg swelling.  Gastrointestinal: Negative.  Negative for abdominal pain, constipation, diarrhea, heartburn, nausea and vomiting.  Musculoskeletal: Negative.  Negative for myalgias.  Neurological: Positive for headaches. Negative for dizziness, focal weakness and seizures.  Endo/Heme/Allergies: Negative for environmental allergies.  Psychiatric/Behavioral: Negative.  Negative for suicidal ideas.    Past Medical History:  Diagnosis Date  . Anemia    takes iron supplement  . Asthma    prn inhaler  . Hypertension    states BP normalized while pregnant and since delivery; is currently not on med.  . Lisfranc dislocation 11/30/2016   left    Past Surgical History:  Procedure Laterality Date  . CHOLECYSTECTOMY  04/13/2012   Procedure: LAPAROSCOPIC CHOLECYSTECTOMY WITH INTRAOPERATIVE CHOLANGIOGRAM;  Surgeon: Adolph Pollack, MD;  Location: [facility] OR;  Service: General;  Laterality: N/A;  laparoscopic cholecystectomy with IOC  . OPEN REDUCTION INTERNAL FIXATION (ORIF) FOOT LISFRANC FRACTURE Left 12/18/2016   Procedure: OPEN REDUCTION INTERNAL FIXATION (ORIF) FOOT LISFRANC FRACTURE;  Surgeon: Toni Arthurs, MD;  Location: [facility];  Service: Orthopedics;  Laterality: Left;    Family History  Problem Relation Age of Onset  . Hypertension Mother   . Stroke Mother   . Heart disease Father   . Depression Father   . Cancer Paternal Grandmother        breast  . Diabetes Paternal Grandmother   . Cancer Paternal Grandfather   . Hypertension Maternal Grandmother   . Diabetes Other     Social History Reviewed with no changes to be made today.   Outpatient Medications Prior to Visit  Medication Sig Dispense Refill  . acetaminophen (TYLENOL) 500 MG tablet Take 1,000 mg by mouth every 6 (six) hours as needed for moderate pain or headache.    . albuterol (PROVENTIL HFA;VENTOLIN HFA) 108 (90 BASE) MCG/ACT inhaler Inhale 2 puffs into the lungs every 6  (six) hours as needed for wheezing. Reported on 04/11/2016    . Ferrous Sulfate (IRON SUPPLEMENT PO) Take 1 tablet by mouth daily.     . hydrochlorothiazide (HYDRODIURIL) 25 MG tablet Take 1 tablet (25 mg total) by mouth daily. 90 tablet 3  . carvedilol (COREG) 12.5 MG tablet Take 1 tablet (12.5 mg total) by mouth 2 (two) times daily with a meal. (Patient not taking: Reported on 01/12/2018) 60 tablet 3   No facility-administered medications prior to visit.     No Known Allergies     Objective:    BP (!) 134/95 (BP Location: Right Arm, Patient Position: Sitting, Cuff Size: Large)   Pulse (!) 110   Temp 98.3 F (36.8 C) (Oral)   Ht 5\' 3"  (1.6 m)   Wt 226 lb 12.8 oz (102.9 kg)   SpO2 95%   BMI 40.18 kg/m  Wt Readings from Last 3 Encounters:  01/13/18 225 lb (102.1 kg)  01/12/18 226 lb 12.8 oz (102.9 kg)  12/28/17 227 lb 6.4 oz (103.1 kg)    Physical Exam  Constitutional: She is oriented to person, place, and time. She appears well-developed and well-nourished. She is cooperative.  HENT:  Head:  Normocephalic and atraumatic.  Eyes: EOM are normal.  Neck: Normal range of motion.  Cardiovascular: Normal rate, regular rhythm and normal heart sounds. Exam reveals no gallop and no friction rub.  No murmur heard. Pulmonary/Chest: Effort normal and breath sounds normal. No tachypnea. No respiratory distress. She has no decreased breath sounds. She has no wheezes. She has no rhonchi. She has no rales. She exhibits no tenderness.  Abdominal: Soft. Bowel sounds are normal.  Musculoskeletal: Normal range of motion. She exhibits no edema.  Neurological: She is alert and oriented to person, place, and time. Coordination normal.  Skin: Skin is warm and dry.  Psychiatric: She has a normal mood and affect. Her behavior is normal. Judgment and thought content normal.  Nursing note and vitals reviewed.        Patient has been counseled extensively about nutrition and exercise as well as the  importance of adherence with medications and regular follow-up. The patient was given clear instructions to go to ER or return to medical center if symptoms don't improve, worsen or new problems develop. The patient verbalized understanding.   Follow-up: Return in about 3 weeks (around 02/02/2018) for BP recheck.   Claiborne RiggZelda W Ellanore Vanhook, FNP-BC [facility] [place], [place] [phone]   01/14/2018, 9:35 AM

## 2018-01-13 ENCOUNTER — Ambulatory Visit (HOSPITAL_BASED_OUTPATIENT_CLINIC_OR_DEPARTMENT_OTHER): Payer: Medicaid Other | Attending: Nurse Practitioner | Admitting: Internal Medicine

## 2018-01-13 VITALS — Ht 63.0 in | Wt 225.0 lb

## 2018-01-13 DIAGNOSIS — R0683 Snoring: Secondary | ICD-10-CM | POA: Diagnosis not present

## 2018-01-13 DIAGNOSIS — G4733 Obstructive sleep apnea (adult) (pediatric): Secondary | ICD-10-CM | POA: Insufficient documentation

## 2018-01-13 DIAGNOSIS — Z7189 Other specified counseling: Secondary | ICD-10-CM

## 2018-01-13 DIAGNOSIS — Z79899 Other long term (current) drug therapy: Secondary | ICD-10-CM | POA: Diagnosis not present

## 2018-01-13 DIAGNOSIS — G4730 Sleep apnea, unspecified: Secondary | ICD-10-CM

## 2018-01-14 ENCOUNTER — Encounter: Payer: Self-pay | Admitting: Nurse Practitioner

## 2018-01-19 ENCOUNTER — Ambulatory Visit: Payer: Medicaid Other | Admitting: Physician Assistant

## 2018-01-25 ENCOUNTER — Telehealth: Payer: Self-pay | Admitting: Nurse Practitioner

## 2018-01-25 NOTE — Patient Instructions (Signed)
Anna GenreJessica L Smith  01/25/2018   Your procedure is scheduled on: 01-29-18  [facility]             Report to admitting at      0800 AM    Call this number if you have problems the morning of surgery [phone]    Remember: Do not eat food or drink liquids :After Midnight.     Take these medicines the morning of surgery with A SIP OF WATER: carvedilol, inhaler and bring                                You may not have any metal on your body including hair pins and              piercings  Do not wear jewelry, make-up, lotions, powders or perfumes, deodorant             Do not wear nail polish.  Do not shave  48 hours prior to surgery.             Do not bring valuables to the hospital. [facility] IS NOT             RESPONSIBLE   FOR VALUABLES.  Contacts, dentures or bridgework may not be worn into surgery.  Leave suitcase in the car. After surgery it may be brought to your room.     Patients discharged the day of surgery will not be allowed to drive home.  Name and phone number of your driver:  Special Instructions: N/A              Please read over the following fact sheets you were given: _____________________________________________________________________             [facility] Pre[facility] Before surgery, you can play an important role.  Because skin is not sterile, your skin needs to be as free of germs as possible.  You can reduce the number of germs on your skin by washing with CHG (chlorahexidine gluconate) soap before surgery.  CHG is an antiseptic cleaner which kills germs and bonds with the skin to continue killing germs even after washing. Please DO NOT use if you have an allergy to CHG or antibacterial soaps.  If your skin becomes reddened/irritated stop using the CHG and inform your nurse when you arrive at Short Stay. Do not shave (including legs and underarms) for at least 48 hours prior to the first  CHG shower.  You may shave your face/neck. Please follow these instructions carefully:  1.  Shower with CHG Soap the night before surgery and the  morning of Surgery.  2.  If you choose to wash your hair, wash your hair first as usual with your  normal  shampoo.  3.  After you shampoo, rinse your hair and body thoroughly to remove the  shampoo.                           4.  Use CHG as you would any other liquid soap.  You can apply chg directly  to the skin and wash                       Gently with a  scrungie or clean washcloth.  5.  Apply the CHG Soap to your body ONLY FROM THE NECK DOWN.   Do not use on face/ open                           Wound or open sores. Avoid contact with eyes, ears mouth and genitals (private parts).                       Wash face,  Genitals (private parts) with your normal soap.             6.  Wash thoroughly, paying special attention to the area where your surgery  will be performed.  7.  Thoroughly rinse your body with warm water from the neck down.  8.  DO NOT shower/wash with your normal soap after using and rinsing off  the CHG Soap.                9.  Pat yourself dry with a clean towel.            10.  Wear clean pajamas.            11.  Place clean sheets on your bed the night of your first shower and do not  sleep with pets. Day of Surgery : Do not apply any lotions/deodorants the morning of surgery.  Please wear clean clothes to the hospital/surgery center.  FAILURE TO FOLLOW THESE INSTRUCTIONS MAY RESULT IN THE CANCELLATION OF YOUR SURGERY PATIENT SIGNATURE_________________________________  NURSE SIGNATURE__________________________________  ________________________________________________________________________

## 2018-01-25 NOTE — Telephone Encounter (Signed)
Pt called to say that she needs clearance from a cardiologist to

## 2018-01-25 NOTE — Progress Notes (Signed)
Order for consent missing please review orders pt has a preop 01-26-18 @ 1000 am! Thank you

## 2018-01-26 ENCOUNTER — Inpatient Hospital Stay (HOSPITAL_COMMUNITY)
Admission: RE | Admit: 2018-01-26 | Discharge: 2018-01-26 | Disposition: A | Discharge status: Home / Self Care | Payer: Medicaid Other | Source: Ambulatory Visit

## 2018-01-26 NOTE — Progress Notes (Signed)
Toniann FailWendy at [facility] aware pt. Has not had  And needs a  cardiac clearance and a preop appt. Before surgery 01-29-18

## 2018-01-26 NOTE — Progress Notes (Addendum)
Per  [facility]yl[facility] at [facility]. Surgery will be cancelled for now.

## 2018-01-27 DIAGNOSIS — G4730 Sleep apnea, unspecified: Secondary | ICD-10-CM

## 2018-01-27 NOTE — Procedures (Signed)
  Patient Name: Shirley Terrell, Shirley Terrell Study Date: 01/13/2018 Gender: Female D.O.B: 1986-09-13 Age (years): 31 Referring Provider: Claiborne RiggZelda W Fleming NP Height (inches): 63 Interpreting Physician: Jetty Duhamellinton Danicka Hourihan MD, ABSM Weight (lbs): 275 RPSGT: Lise AuerGibson,RPSGT, Theresa BMI: 49 MRN: [id number] Neck Size: 15.00 <br> <br> CLINICAL INFORMATION Sleep Study Type: NPSG  Indication for sleep study: N/A  Epworth Sleepiness Score: 7  SLEEP STUDY TECHNIQUE As per the AASM Manual for the Scoring of Sleep and Associated Events v2.3 (April 2016) with a hypopnea requiring 4% desaturations.  The channels recorded and monitored were frontal, central and occipital EEG, electrooculogram (EOG), submentalis EMG (chin), nasal and oral airflow, thoracic and abdominal wall motion, anterior tibialis EMG, snore microphone, electrocardiogram, and pulse oximetry.  MEDICATIONS Medications self-administered by patient taken the night of the study : CARVEDILOL  SLEEP ARCHITECTURE The study was initiated at 11:08:58 PM and ended at 5:22:25 AM.  Sleep onset time was 25.1 minutes and the sleep efficiency was 89.5%%. The total sleep time was 334.4 minutes.  Stage REM latency was 55.0 minutes.  The patient spent 14.7%% of the night in stage N1 sleep, 62.0%% in stage N2 sleep, 4.0%% in stage N3 and 19.29% in REM.  Alpha intrusion was absent.  Supine sleep was 61.10%.  RESPIRATORY PARAMETERS The overall apnea/hypopnea index (AHI) was 8.3 per hour. There were 2 total apneas, including 2 obstructive, 0 central and 0 mixed apneas. There were 44 hypopneas and 15 RERAs.  The AHI during Stage REM sleep was 26.0 per hour.  AHI while supine was 12.3 per hour.  The mean oxygen saturation was 93.5%. The minimum SpO2 during sleep was 80.0%.  loud snoring was noted during this study.  CARDIAC DATA The 2 lead EKG demonstrated sinus rhythm. The mean heart rate was 86.4 beats per minute. Other EKG findings include: None.  LEG  MOVEMENT DATA The total PLMS were 0 with a resulting PLMS index of 0.0. Associated arousal with leg movement index was 0.0 .  IMPRESSIONS - Mild obstructive sleep apnea occurred during this study (AHI = 8.3/h). - No significant central sleep apnea occurred during this study (CAI = 0.0/h). - Moderate oxygen desaturation was noted during this study (Min O2 = 80.0%). Mean 93.5% - The patient snored with loud snoring volume. - No cardiac abnormalities were noted during this study. - Clinically significant periodic limb movements did not occur during sleep. No significant associated arousals.  DIAGNOSIS - Obstructive Sleep Apnea (327.23 [G47.33 ICD-10])  RECOMMENDATIONS - Conserrvative measures may be sufficient. Depending on clinical judgment, CPAP or a fitted oral appliance might be appropriate. - Positional therapy avoiding supine position during sleep. - Be careful with alcohol, sedatives and other CNS depressants that may worsen sleep apnea and disrupt normal sleep architecture. - Sleep hygiene should be reviewed to assess factors that may improve sleep quality. - Weight management and regular exercise should be initiated or continued if appropriate.  [Electronically signed] 01/27/2018 02:07 PM  Jetty Duhamellinton Faaris Arizpe MD, ABSM Diplomate, American Board of Sleep Medicine   NPI: [id number]                           Jetty Duhamellinton Daimien Patmon Diplomate, American Board of Sleep Medicine  ELECTRONICALLY SIGNED ON:  01/27/2018, 2:01 PM [facility] PH: [phone]   FX: [phone] ACCREDITED BY THE AMERICAN ACADEMY OF SLEEP MEDICINE

## 2018-01-28 ENCOUNTER — Ambulatory Visit: Payer: Medicaid Other | Attending: Nurse Practitioner | Admitting: Nurse Practitioner

## 2018-01-28 ENCOUNTER — Encounter: Payer: Self-pay | Admitting: Nurse Practitioner

## 2018-01-28 VITALS — BP 133/85 | HR 94 | Temp 98.5°F | Ht 63.0 in | Wt 231.0 lb

## 2018-01-28 DIAGNOSIS — Z833 Family history of diabetes mellitus: Secondary | ICD-10-CM | POA: Diagnosis not present

## 2018-01-28 DIAGNOSIS — I10 Essential (primary) hypertension: Secondary | ICD-10-CM

## 2018-01-28 DIAGNOSIS — Z9049 Acquired absence of other specified parts of digestive tract: Secondary | ICD-10-CM | POA: Diagnosis not present

## 2018-01-28 DIAGNOSIS — Z79899 Other long term (current) drug therapy: Secondary | ICD-10-CM | POA: Diagnosis not present

## 2018-01-28 DIAGNOSIS — Z87891 Personal history of nicotine dependence: Secondary | ICD-10-CM | POA: Diagnosis not present

## 2018-01-28 DIAGNOSIS — K5900 Constipation, unspecified: Secondary | ICD-10-CM | POA: Insufficient documentation

## 2018-01-28 DIAGNOSIS — Z803 Family history of malignant neoplasm of breast: Secondary | ICD-10-CM | POA: Diagnosis not present

## 2018-01-28 DIAGNOSIS — Z9889 Other specified postprocedural states: Secondary | ICD-10-CM | POA: Insufficient documentation

## 2018-01-28 DIAGNOSIS — Z6841 Body Mass Index (BMI) 40.0 and over, adult: Secondary | ICD-10-CM | POA: Insufficient documentation

## 2018-01-28 DIAGNOSIS — Z823 Family history of stroke: Secondary | ICD-10-CM | POA: Diagnosis not present

## 2018-01-28 MED ORDER — SENNA-DOCUSATE SODIUM 8.6-50 MG PO TABS: 2.0000 | ORAL_TABLET | Freq: Every day | ORAL | 2 refills | Status: AC

## 2018-01-28 NOTE — Patient Instructions (Addendum)

## 2018-01-28 NOTE — Progress Notes (Signed)
Assessment & Plan:  Shirley Terrell was seen today for follow-up and constipation.  Diagnoses and all orders for this visit:  Essential hypertension Continue all antihypertensives as prescribed.  Remember to bring in your blood pressure log with you for your follow up appointment.  DASH/Mediterranean Diets are healthier choices for HTN.  No changes made with medications today.    Morbid obesity with BMI of 40.0-44.9, adult (HCC) Discussed diet and exercise for person with BMI >25. Instructed: You must burn more calories than you eat. Losing 5 percent of your body weight should be considered a success. In the longer term, losing more than 15 percent of your body weight and staying at this weight is an extremely good result. However, keep in mind that even losing 5 percent of your body weight leads to important health benefits, so try not to get discouraged if you're not able to lose more than this. Will recheck weight in 3-6 months.   Constipation, unspecified constipation type -     sennosides-docusate sodium (SENOKOT-S) 8.6-50 MG tablet; Take 2 tablets by mouth daily.    Patient has been counseled on age-appropriate routine health concerns for screening and prevention. These are reviewed and up-to-date. Referrals have been placed accordingly. Immunizations are up-to-date or declined.    Subjective:   Chief Complaint  Patient presents with  . Follow-up    Pt's is here for a follow-up for blood pressure. Pt's stated she quit smoking last week.   . Constipation    Pt's been having trouble with producing bowl movement, tried stool softner no help.   HPI Shirley Terrell 32 y.o. female presents to office today for blood pressure recheck.  Essential Hypertension Blood pressure is controlled today on hydrochlorothiazide 25 mg daily and carvedilol 12.5 mg twice daily.  She continues on the nicotine patch however she has stopped smoking cigarettes as of 1 week ago. Denies chest pain,  shortness of breath, palpitations, lightheadedness, dizziness, headaches or BLE edema.  She is endorsing constipation which may also be worsening due to the hydrochlorothiazide. BP Readings from Last 3 Encounters:  01/28/18 133/85  01/12/18 (!) 134/95  12/28/17 (!) 148/106     Tobacco Dependence Last cigarette was 5 days ago. She was prescribed nicotine patches at her last office visit.   Constipation Acute. Her last BM was 5 days ago. Producing small hard pellet like stools. She took OTC stool softener and laxative with no relief. She endorses drinking plenty of water. Constipation likely related to diet, lack of exercise and HCTZ. She does not eat many vegetables. SHe denies any hematochezia, hemorrhoids or melena.   Review of Systems  Constitutional: Negative for fever, malaise/fatigue and weight loss.  HENT: Negative.  Negative for nosebleeds.   Eyes: Negative.  Negative for blurred vision, double vision and photophobia.  Respiratory: Negative.  Negative for cough and shortness of breath.   Cardiovascular: Negative.  Negative for chest pain, palpitations and leg swelling.  Gastrointestinal: Positive for constipation. Negative for blood in stool, heartburn, melena, nausea and vomiting.  Musculoskeletal: Negative.  Negative for myalgias.  Neurological: Negative.  Negative for dizziness, focal weakness, seizures and headaches.  Psychiatric/Behavioral: Negative.  Negative for suicidal ideas.    Past Medical History:  Diagnosis Date  . Anemia    takes iron supplement  . Asthma    prn inhaler  . Hypertension    states BP normalized while pregnant and since delivery; is currently not on med.  . Lisfranc dislocation 11/30/2016  left    Past Surgical History:  Procedure Laterality Date  . CHOLECYSTECTOMY  04/13/2012   Procedure: LAPAROSCOPIC CHOLECYSTECTOMY WITH INTRAOPERATIVE CHOLANGIOGRAM;  Surgeon: Adolph Pollack, MD;  Location: [facility] OR;  Service: General;  Laterality: N/A;   laparoscopic cholecystectomy with IOC  . OPEN REDUCTION INTERNAL FIXATION (ORIF) FOOT LISFRANC FRACTURE Left 12/18/2016   Procedure: OPEN REDUCTION INTERNAL FIXATION (ORIF) FOOT LISFRANC FRACTURE;  Surgeon: Toni Arthurs, MD;  Location: [facility];  Service: Orthopedics;  Laterality: Left;    Family History  Problem Relation Age of Onset  . Hypertension Mother   . Stroke Mother   . Heart disease Father   . Depression Father   . Cancer Paternal Grandmother        breast  . Diabetes Paternal Grandmother   . Cancer Paternal Grandfather   . Hypertension Maternal Grandmother   . Diabetes Other     Social History Reviewed with no changes to be made today.   Outpatient Medications Prior to Visit  Medication Sig Dispense Refill  . acetaminophen (TYLENOL) 500 MG tablet Take 1,000 mg by mouth every 6 (six) hours as needed for moderate pain or headache.    . albuterol (PROVENTIL HFA;VENTOLIN HFA) 108 (90 BASE) MCG/ACT inhaler Inhale 2 puffs into the lungs every 6 (six) hours as needed for wheezing. Reported on 04/11/2016    . carvedilol (COREG) 12.5 MG tablet Take 1 tablet (12.5 mg total) by mouth 2 (two) times daily with a meal. 60 tablet 3  . hydrochlorothiazide (HYDRODIURIL) 25 MG tablet Take 1 tablet (25 mg total) by mouth daily. 90 tablet 3  . nicotine (NICODERM CQ - DOSED IN MG/24 HOURS) 14 mg/24hr patch Place 1 patch (14 mg total) onto the skin daily. 42 patch 0  . Ferrous Sulfate (IRON SUPPLEMENT PO) Take 1 tablet by mouth daily.     . nicotine (NICODERM CQ - DOSED IN MG/24 HR) 7 mg/24hr patch Place 1 patch (7 mg total) onto the skin daily. (Patient not taking: Reported on 01/28/2018) 14 patch 0   No facility-administered medications prior to visit.     No Known Allergies     Objective:    BP 133/85 (BP Location: Left Arm, Patient Position: Sitting, Cuff Size: Large)   Pulse 94   Temp 98.5 F (36.9 C) (Oral)   Ht 5\' 3"  (1.6 m)   Wt 231 lb (104.8 kg)   SpO2 94%    BMI 40.92 kg/m  Wt Readings from Last 3 Encounters:  01/28/18 231 lb (104.8 kg)  01/13/18 225 lb (102.1 kg)  01/12/18 226 lb 12.8 oz (102.9 kg)    Physical Exam  Constitutional: She is oriented to person, place, and time. She appears well-developed and well-nourished. She is cooperative.  HENT:  Head: Normocephalic and atraumatic.  Eyes: EOM are normal.  Neck: Normal range of motion.  Cardiovascular: Normal rate, regular rhythm and normal heart sounds. Exam reveals no gallop and no friction rub.  No murmur heard. Pulmonary/Chest: Effort normal and breath sounds normal. No tachypnea. No respiratory distress. She has no decreased breath sounds. She has no wheezes. She has no rhonchi. She has no rales. She exhibits no tenderness.  Abdominal: Soft. Bowel sounds are normal. She exhibits no distension. There is no tenderness.  Musculoskeletal: Normal range of motion. She exhibits no edema.  Neurological: She is alert and oriented to person, place, and time. Coordination normal.  Skin: Skin is warm and dry.  Psychiatric: She has a normal mood  and affect. Her behavior is normal. Judgment and thought content normal.  Nursing note and vitals reviewed.      Patient has been counseled extensively about nutrition and exercise as well as the importance of adherence with medications and regular follow-up. The patient was given clear instructions to go to ER or return to medical center if symptoms don't improve, worsen or new problems develop. The patient verbalized understanding.   Follow-up: Return in about 3 months (around 04/30/2018) for Needs appointment with financial representative.Claiborne Rigg.   Zelda W Fleming, FNP-BC [facility] [place], [place] [phone]   02/02/2018, 10:11 AM

## 2018-01-29 ENCOUNTER — Encounter (HOSPITAL_COMMUNITY): Admission: RE | Payer: Self-pay | Source: Ambulatory Visit

## 2018-01-29 ENCOUNTER — Ambulatory Visit (HOSPITAL_COMMUNITY): Admission: RE | Admit: 2018-01-29 | Payer: Medicaid Other | Source: Ambulatory Visit | Admitting: Surgery

## 2018-01-29 SURGERY — REPAIR, HERNIA, VENTRAL, LAPAROSCOPIC
Anesthesia: General

## 2018-02-02 ENCOUNTER — Encounter: Payer: Self-pay | Admitting: Nurse Practitioner

## 2018-02-15 ENCOUNTER — Encounter: Payer: Self-pay | Admitting: *Deleted

## 2018-03-22 ENCOUNTER — Other Ambulatory Visit: Payer: Self-pay

## 2018-03-22 DIAGNOSIS — F17200 Nicotine dependence, unspecified, uncomplicated: Secondary | ICD-10-CM

## 2018-03-22 MED ORDER — NICOTINE 14 MG/24HR TD PT24: 14.0000 mg | MEDICATED_PATCH | Freq: Every day | TRANSDERMAL | 0 refills | Status: DC

## 2018-03-22 NOTE — Progress Notes (Signed)
Pharmacy request refill Nicotine Patch /24h. Rx sent.

## 2018-03-24 ENCOUNTER — Telehealth: Payer: Self-pay

## 2018-03-24 NOTE — Telephone Encounter (Signed)
    [facility] Pre-operative Risk Assessment    Request for surgical clearance:  1. What type of surgery is being performed? HERNIA REPAIR    2. When is this surgery scheduled? TBD    3. What type of clearance is required (medical clearance vs. Pharmacy clearance to hold med vs. Both)? BOTH  4. Are there any medications that need to be held prior to surgery and how long? NONE LISTED   5. Practice name and name of physician performing surgery? [facility] - NONE LISTED   6. What is your office phone number [phone]    7.   What is your office fax number [phone], [place], CMA  8.   Anesthesia type (None, local, MAC, general) ? GENERAL   Shirley Terrell 03/24/2018, 4:59 PM  _________________________________________________________________   (provider comments below)

## 2018-03-25 NOTE — Telephone Encounter (Signed)
I do not see that this pt is followed in our office. Can preop callback see what is needed and perhaps call her to see if she has been seen, perhaps with a provider in another office?

## 2018-03-26 NOTE — Telephone Encounter (Signed)
   Chart reviewed as part of pre-operative protocol coverage.  As below, patient is not followed in our practice. Callback staff, please call surgeon's office to notify them that she is not a patient of record - if they would like a pre-op clearance appt, please arrange.  No need for this message to remain in APP pool. Will remove.    Laurann Montana, PA-C 03/26/2018, 8:24 AM

## 2018-03-26 NOTE — Telephone Encounter (Addendum)
Pt has a new patient appt with Lizabeth Leyden, NP on 5/20 for cardiac clearance. [facility]  is aware and agreeable to patient appt

## 2018-04-04 DIAGNOSIS — G4733 Obstructive sleep apnea (adult) (pediatric): Secondary | ICD-10-CM | POA: Insufficient documentation

## 2018-04-04 DIAGNOSIS — Z720 Tobacco use: Secondary | ICD-10-CM | POA: Insufficient documentation
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
## 2018-04-05 ENCOUNTER — Encounter: Payer: Self-pay | Admitting: Cardiology

## 2018-04-05 ENCOUNTER — Ambulatory Visit: Payer: Medicaid Other | Admitting: Cardiology

## 2018-04-05 VITALS — BP 140/92 | HR 73 | Ht 63.0 in | Wt 228.0 lb

## 2018-04-05 DIAGNOSIS — G4733 Obstructive sleep apnea (adult) (pediatric): Secondary | ICD-10-CM

## 2018-04-05 DIAGNOSIS — Z720 Tobacco use: Secondary | ICD-10-CM | POA: Diagnosis not present

## 2018-04-05 DIAGNOSIS — I10 Essential (primary) hypertension: Secondary | ICD-10-CM

## 2018-04-05 MED ORDER — AMLODIPINE BESYLATE 5 MG PO TABS: 5.0000 mg | ORAL_TABLET | Freq: Every day | ORAL | 3 refills | Status: DC

## 2018-04-05 NOTE — Patient Instructions (Addendum)
Medication Instructions:  1. START AMLODIPINE 5 MG DAILY; RX HAS BEEN SENT IN  Labwork: NONE ORDERED TODAY  Testing/Procedures: NONE ORDERED TODAY  Follow-Up: 1. DR. Anne Fu IN 3 MONTHS OR NINA HAMMOND, NP SAME DAY DR. Anne Fu IS IN THE OFFICE   2. PLEASE SCHEDULE APPT WITH THE HYPERTENSION CLINIC FOR 2 WEEKS   Any Other Special Instructions Will Be Listed Below (If Applicable).  If you need a refill on your cardiac medications before your next appointment, please call your pharmacy.  Sleep Apnea Sleep apnea is a condition that affects breathing. People with sleep apnea have moments during sleep when their breathing pauses briefly or gets shallow. Sleep apnea can cause these symptoms:  Trouble staying asleep.  Sleepiness or tiredness during the day.  Irritability.  Loud snoring.  Morning headaches.  Trouble concentrating.  Forgetting things.  Less interest in sex.  Being sleepy for no reason.  Mood swings.  Personality changes.  Depression.  Waking up a lot during the night to pee (urinate).  Dry mouth.  Sore throat.  Follow these instructions at home:  Make any changes in your routine that your doctor recommends.  Eat a healthy, well-balanced diet.  Take over-the-counter and prescription medicines only as told by your doctor.  Avoid using alcohol, calming medicines (sedatives), and narcotic medicines.  Take steps to lose weight if you are overweight.  If you were given a machine (device) to use while you sleep, use it only as told by your doctor.  Do not use any tobacco products, such as cigarettes, chewing tobacco, and e-cigarettes. If you need help quitting, ask your doctor.  Keep all follow-up visits as told by your doctor. This is important. Contact a doctor if:  The machine that you were given to use during sleep is uncomfortable or does not seem to be working.  Your symptoms do not get better.  Your symptoms get worse. Get help right  away if:  Your chest hurts.  You have trouble breathing in enough air (shortness of breath).  You have an uncomfortable feeling in your back, arms, or stomach.  You have trouble talking.  One side of your body feels weak.  A part of your face is hanging down (drooping). These symptoms may be an emergency. Do not wait to see if the symptoms will go away. Get medical help right away. Call your local emergency services (911 in the [place] Do not drive yourself to the hospital. This information is not intended to replace advice given to you by your health care provider. Make sure you discuss any questions you have with your health care provider. Document Released: 08/12/2008 Document Revised: 06/29/2016 Document Reviewed: 08/13/2015 Elsevier Interactive Patient Education  2018 [employer].    DASH Eating Plan DASH stands for "Dietary Approaches to Stop Hypertension." The DASH eating plan is a healthy eating plan that has been shown to reduce high blood pressure (hypertension). It may also reduce your risk for type 2 diabetes, heart disease, and stroke. The DASH eating plan may also help with weight loss. What are tips for following this plan? General guidelines  Avoid eating more than 2,300 mg (milligrams) of salt (sodium) a day. If you have hypertension, you may need to reduce your sodium intake to 1,500 mg a day.  Limit alcohol intake to no more than 1 drink a day for nonpregnant women and 2 drinks a day for men. One drink equals 12 oz of beer, 5 oz of wine, or 1 oz  of hard liquor.  Work with your health care provider to maintain a healthy body weight or to lose weight. Ask what an ideal weight is for you.  Get at least 30 minutes of exercise that causes your heart to beat faster (aerobic exercise) most days of the week. Activities may include walking, swimming, or biking.  Work with your health care provider or diet and nutrition specialist (dietitian) to adjust your eating plan to  your individual calorie needs. Reading food labels  Check food labels for the amount of sodium per serving. Choose foods with less than 5 percent of the Daily Value of sodium. Generally, foods with less than 300 mg of sodium per serving fit into this eating plan.  To find whole grains, look for the word "whole" as the first word in the ingredient list. Shopping  Buy products labeled as "low-sodium" or "no salt added."  Buy fresh foods. Avoid canned foods and premade or frozen meals. Cooking  Avoid adding salt when cooking. Use salt-free seasonings or herbs instead of table salt or sea salt. Check with your health care provider or pharmacist before using salt substitutes.  Do not fry foods. Cook foods using healthy methods such as baking, boiling, grilling, and broiling instead.  Cook with heart-healthy oils, such as olive, canola, soybean, or sunflower oil. Meal planning   Eat a balanced diet that includes: ? 5 or more servings of fruits and vegetables each day. At each meal, try to fill half of your plate with fruits and vegetables. ? Up to 6-8 servings of whole grains each day. ? Less than 6 oz of lean meat, poultry, or fish each day. A 3-oz serving of meat is about the same size as a deck of cards. One egg equals 1 oz. ? 2 servings of low-fat dairy each day. ? A serving of nuts, seeds, or beans 5 times each week. ? Heart-healthy fats. Healthy fats called Omega-3 fatty acids are found in foods such as flaxseeds and coldwater fish, like sardines, salmon, and mackerel.  Limit how much you eat of the following: ? Canned or prepackaged foods. ? Food that is high in trans fat, such as fried foods. ? Food that is high in saturated fat, such as fatty meat. ? Sweets, desserts, sugary drinks, and other foods with added sugar. ? Full-fat dairy products.  Do not salt foods before eating.  Try to eat at least 2 vegetarian meals each week.  Eat more home-cooked food and less restaurant,  buffet, and fast food.  When eating at a restaurant, ask that your food be prepared with less salt or no salt, if possible. What foods are recommended? The items listed may not be a complete list. Talk with your dietitian about what dietary choices are best for you. Grains Whole-grain or whole-wheat bread. Whole-grain or whole-wheat pasta. Brown rice. Orpah Cobb. Bulgur. Whole-grain and low-sodium cereals. Pita bread. Low-fat, low-sodium crackers. Whole-wheat flour tortillas. Vegetables Fresh or frozen vegetables (raw, steamed, roasted, or grilled). Low-sodium or reduced-sodium tomato and vegetable juice. Low-sodium or reduced-sodium tomato sauce and tomato paste. Low-sodium or reduced-sodium canned vegetables. Fruits All fresh, dried, or frozen fruit. Canned fruit in natural juice (without added sugar). Meat and other protein foods Skinless chicken or [place]. Ground chicken or [place]. Pork with fat trimmed off. Fish and seafood. Egg whites. Dried beans, peas, or lentils. Unsalted nuts, nut butters, and seeds. Unsalted canned beans. Lean cuts of beef with fat trimmed off. Low-sodium, lean deli meat. Dairy Low-fat (  1%) or fat-free (skim) milk. Fat-free, low-fat, or reduced-fat cheeses. Nonfat, low-sodium ricotta or cottage cheese. Low-fat or nonfat yogurt. Low-fat, low-sodium cheese. Fats and oils Soft margarine without trans fats. Vegetable oil. Low-fat, reduced-fat, or light mayonnaise and salad dressings (reduced-sodium). Canola, safflower, olive, soybean, and sunflower oils. Avocado. Seasoning and other foods Herbs. Spices. Seasoning mixes without salt. Unsalted popcorn and pretzels. Fat-free sweets. What foods are not recommended? The items listed may not be a complete list. Talk with your dietitian about what dietary choices are best for you. Grains Baked goods made with fat, such as croissants, muffins, or some breads. Dry pasta or rice meal packs. Vegetables Creamed or fried  vegetables. Vegetables in a cheese sauce. Regular canned vegetables (not low-sodium or reduced-sodium). Regular canned tomato sauce and paste (not low-sodium or reduced-sodium). Regular tomato and vegetable juice (not low-sodium or reduced-sodium). Rosita Fire. Olives. Fruits Canned fruit in a light or heavy syrup. Fried fruit. Fruit in cream or butter sauce. Meat and other protein foods Fatty cuts of meat. Ribs. Fried meat. Tomasa Blase. Sausage. Bologna and other processed lunch meats. Salami. Fatback. Hotdogs. Bratwurst. Salted nuts and seeds. Canned beans with added salt. Canned or smoked fish. Whole eggs or egg yolks. Chicken or [place] with skin. Dairy Whole or 2% milk, cream, and half-and-half. Whole or full-fat cream cheese. Whole-fat or sweetened yogurt. Full-fat cheese. Nondairy creamers. Whipped toppings. Processed cheese and cheese spreads. Fats and oils Butter. Stick margarine. Lard. Shortening. Ghee. Bacon fat. Tropical oils, such as coconut, palm kernel, or palm oil. Seasoning and other foods Salted popcorn and pretzels. Onion salt, garlic salt, seasoned salt, table salt, and sea salt. Worcestershire sauce. Tartar sauce. Barbecue sauce. Teriyaki sauce. Soy sauce, including reduced-sodium. Steak sauce. Canned and packaged gravies. Fish sauce. Oyster sauce. Cocktail sauce. Horseradish that you find on the shelf. Ketchup. Mustard. Meat flavorings and tenderizers. Bouillon cubes. Hot sauce and Tabasco sauce. Premade or packaged marinades. Premade or packaged taco seasonings. Relishes. Regular salad dressings. Where to find more information:  National Heart, Lung, and Blood Institute: PopSteam.is  American Heart Association: www.heart.org Summary  The DASH eating plan is a healthy eating plan that has been shown to reduce high blood pressure (hypertension). It may also reduce your risk for type 2 diabetes, heart disease, and stroke.  With the DASH eating plan, you should limit salt (sodium)  intake to 2,300 mg a day. If you have hypertension, you may need to reduce your sodium intake to 1,500 mg a day.  When on the DASH eating plan, aim to eat more fresh fruits and vegetables, whole grains, lean proteins, low-fat dairy, and heart-healthy fats.  Work with your health care provider or diet and nutrition specialist (dietitian) to adjust your eating plan to your individual calorie needs. This information is not intended to replace advice given to you by your health care provider. Make sure you discuss any questions you have with your health care provider. Document Released: 10/23/2011 Document Revised: 10/27/2016 Document Reviewed: 10/27/2016 Elsevier Interactive Patient Education  [employer].

## 2018-04-22 ENCOUNTER — Ambulatory Visit: Payer: Medicaid Other

## 2018-04-23 ENCOUNTER — Ambulatory Visit: Payer: Medicaid Other

## 2018-04-23 NOTE — Progress Notes (Deleted)
Patient ID: Shirley Terrell                 DOB: 14-Dec-1985                      MRN: [id number]     HPI: Shirley Terrell is a 32 y.o. female patient of Dr. Anne Fu who presents today for hypertension evaluation. PMH significant for anemia, asthma, hypertension and tobacco use. She has been hypertensive since age 89 yo. Her parents and grandparents have HTN. She was referred to cardiology for BP management prior to hernia repair surgery and will need BP <130/80 to proceed with surgery. At her most recent visit she was started on amlodipine 5mg  daily  She presents today for   She may be breast feeding?? - advise if pregnant will need to change regimen   Current HTN meds:  Carvedilol 12.5mg  BID Hydrochlorothiazide 25mg  daily Amlodipine 5mg  daily  Previously tried:   BP goal: <130/80  Family History: Cancer in her paternal grandfather and paternal grandmother; Depression in her father; Diabetes in her other and paternal grandmother; Healthy in her sister, sister, sister, and sister; Heart disease in her father; Hypertension in her father, maternal grandmother, and mother; Renal Disease in her mother; Stroke in her father and mother  Social History: current smoker, occasional alcohol  Diet:   Exercise:   Home BP readings:   Wt Readings from Last 3 Encounters:  04/05/18 228 lb (103.4 kg)  01/28/18 231 lb (104.8 kg)  01/13/18 225 lb (102.1 kg)   BP Readings from Last 3 Encounters:  04/05/18 (!) 140/92  01/28/18 133/85  01/12/18 (!) 134/95   Pulse Readings from Last 3 Encounters:  04/05/18 73  01/28/18 94  01/12/18 (!) 110    Renal function: CrCl cannot be calculated (Patient's most recent lab result is older than the maximum 21 days allowed.).  Past Medical History:  Diagnosis Date  . Anemia    takes iron supplement  . Asthma    prn inhaler  . Hypertension    states BP normalized while pregnant and since delivery; is currently not on med.  . Lisfranc dislocation  11/30/2016   left    Current Outpatient Medications on File Prior to Visit  Medication Sig Dispense Refill  . acetaminophen (TYLENOL) 500 MG tablet Take 1,000 mg by mouth every 6 (six) hours as needed for moderate pain or headache.    . albuterol (PROVENTIL HFA;VENTOLIN HFA) 108 (90 BASE) MCG/ACT inhaler Inhale 2 puffs into the lungs every 6 (six) hours as needed for wheezing. Reported on 04/11/2016    . amLODipine (NORVASC) 5 MG tablet Take 1 tablet (5 mg total) by mouth daily. 90 tablet 3  . carvedilol (COREG) 12.5 MG tablet Take 1 tablet (12.5 mg total) by mouth 2 (two) times daily with a meal. 60 tablet 3  . Ferrous Sulfate (IRON SUPPLEMENT PO) Take 1 tablet by mouth daily.     . hydrochlorothiazide (HYDRODIURIL) 25 MG tablet Take 1 tablet (25 mg total) by mouth daily. 90 tablet 3  . nicotine (NICODERM CQ - DOSED IN MG/24 HOURS) 14 mg/24hr patch Place 1 patch (14 mg total) onto the skin daily. 42 patch 0  . nicotine (NICODERM CQ - DOSED IN MG/24 HR) 7 mg/24hr patch Place 1 patch (7 mg total) onto the skin daily. 14 patch 0   No current facility-administered medications on file prior to visit.     No Known Allergies  currently  breastfeeding.   Assessment/Plan: Hypertension:    Thank you, Freddie ApleyKelley M. Cleatis PolkaAuten, PharmD  [facility]  04/23/2018 8:03 AM

## 2018-04-30 ENCOUNTER — Ambulatory Visit: Payer: Medicaid Other | Admitting: Nurse Practitioner

## 2018-05-24 ENCOUNTER — Ambulatory Visit: Payer: Medicaid Other | Attending: Nurse Practitioner | Admitting: Nurse Practitioner

## 2018-05-24 ENCOUNTER — Encounter: Payer: Self-pay | Admitting: Nurse Practitioner

## 2018-05-24 VITALS — BP 114/77 | HR 98 | Temp 98.6°F | Ht 63.0 in | Wt 227.4 lb

## 2018-05-24 DIAGNOSIS — J45909 Unspecified asthma, uncomplicated: Secondary | ICD-10-CM | POA: Insufficient documentation

## 2018-05-24 DIAGNOSIS — I10 Essential (primary) hypertension: Secondary | ICD-10-CM | POA: Diagnosis present

## 2018-05-24 DIAGNOSIS — Z79899 Other long term (current) drug therapy: Secondary | ICD-10-CM | POA: Insufficient documentation

## 2018-05-24 DIAGNOSIS — Z9049 Acquired absence of other specified parts of digestive tract: Secondary | ICD-10-CM | POA: Diagnosis not present

## 2018-05-24 DIAGNOSIS — Z818 Family history of other mental and behavioral disorders: Secondary | ICD-10-CM | POA: Insufficient documentation

## 2018-05-24 DIAGNOSIS — Z8249 Family history of ischemic heart disease and other diseases of the circulatory system: Secondary | ICD-10-CM | POA: Diagnosis not present

## 2018-05-24 DIAGNOSIS — Z841 Family history of disorders of kidney and ureter: Secondary | ICD-10-CM | POA: Insufficient documentation

## 2018-05-24 DIAGNOSIS — Z6841 Body Mass Index (BMI) 40.0 and over, adult: Secondary | ICD-10-CM | POA: Diagnosis not present

## 2018-05-24 DIAGNOSIS — G4733 Obstructive sleep apnea (adult) (pediatric): Secondary | ICD-10-CM | POA: Diagnosis not present

## 2018-05-24 DIAGNOSIS — D649 Anemia, unspecified: Secondary | ICD-10-CM | POA: Insufficient documentation

## 2018-05-24 DIAGNOSIS — Z823 Family history of stroke: Secondary | ICD-10-CM | POA: Insufficient documentation

## 2018-05-24 MED ORDER — HYDROCHLOROTHIAZIDE 25 MG PO TABS: 25.0000 mg | ORAL_TABLET | Freq: Every day | ORAL | 3 refills | Status: DC

## 2018-05-24 MED ORDER — CARVEDILOL 12.5 MG PO TABS: 12.5000 mg | ORAL_TABLET | Freq: Two times a day (BID) | ORAL | 3 refills | Status: DC

## 2018-05-24 NOTE — Patient Instructions (Addendum)
DASH Eating Plan DASH stands for "Dietary Approaches to Stop Hypertension." The DASH eating plan is a healthy eating plan that has been shown to reduce high blood pressure (hypertension). It may also reduce your risk for type 2 diabetes, heart disease, and stroke. The DASH eating plan may also help with weight loss. What are tips for following this plan? General guidelines  Avoid eating more than 2,300 mg (milligrams) of salt (sodium) a day. If you have hypertension, you may need to reduce your sodium intake to 1,500 mg a day.  Limit alcohol intake to no more than 1 drink a day for nonpregnant women and 2 drinks a day for men. One drink equals 12 oz of beer, 5 oz of wine, or 1 oz of hard liquor.  Work with your health care provider to maintain a healthy body weight or to lose weight. Ask what an ideal weight is for you.  Get at least 30 minutes of exercise that causes your heart to beat faster (aerobic exercise) most days of the week. Activities may include walking, swimming, or biking.  Work with your health care provider or diet and nutrition specialist (dietitian) to adjust your eating plan to your individual calorie needs. Reading food labels  Check food labels for the amount of sodium per serving. Choose foods with less than 5 percent of the Daily Value of sodium. Generally, foods with less than 300 mg of sodium per serving fit into this eating plan.  To find whole grains, look for the word "whole" as the first word in the ingredient list. Shopping  Buy products labeled as "low-sodium" or "no salt added."  Buy fresh foods. Avoid canned foods and premade or frozen meals. Cooking  Avoid adding salt when cooking. Use salt-free seasonings or herbs instead of table salt or sea salt. Check with your health care provider or pharmacist before using salt substitutes.  Do not fry foods. Cook foods using healthy methods such as baking, boiling, grilling, and broiling instead.  Cook with  heart-healthy oils, such as olive, canola, soybean, or sunflower oil. Meal planning   Eat a balanced diet that includes: ? 5 or more servings of fruits and vegetables each day. At each meal, try to fill half of your plate with fruits and vegetables. ? Up to 6-8 servings of whole grains each day. ? Less than 6 oz of lean meat, poultry, or fish each day. A 3-oz serving of meat is about the same size as a deck of cards. One egg equals 1 oz. ? 2 servings of low-fat dairy each day. ? A serving of nuts, seeds, or beans 5 times each week. ? Heart-healthy fats. Healthy fats called Omega-3 fatty acids are found in foods such as flaxseeds and coldwater fish, like sardines, salmon, and mackerel.  Limit how much you eat of the following: ? Canned or prepackaged foods. ? Food that is high in trans fat, such as fried foods. ? Food that is high in saturated fat, such as fatty meat. ? Sweets, desserts, sugary drinks, and other foods with added sugar. ? Full-fat dairy products.  Do not salt foods before eating.  Try to eat at least 2 vegetarian meals each week.  Eat more home-cooked food and less restaurant, buffet, and fast food.  When eating at a restaurant, ask that your food be prepared with less salt or no salt, if possible. What foods are recommended? The items listed may not be a complete list. Talk with your dietitian about what   dietary choices are best for you. Grains Whole-grain or whole-wheat bread. Whole-grain or whole-wheat pasta. Brown rice. Oatmeal. Quinoa. Bulgur. Whole-grain and low-sodium cereals. Pita bread. Low-fat, low-sodium crackers. Whole-wheat flour tortillas. Vegetables Fresh or frozen vegetables (raw, steamed, roasted, or grilled). Low-sodium or reduced-sodium tomato and vegetable juice. Low-sodium or reduced-sodium tomato sauce and tomato paste. Low-sodium or reduced-sodium canned vegetables. Fruits All fresh, dried, or frozen fruit. Canned fruit in natural juice (without  added sugar). Meat and other protein foods Skinless chicken or turkey. Ground chicken or turkey. Pork with fat trimmed off. Fish and seafood. Egg whites. Dried beans, peas, or lentils. Unsalted nuts, nut butters, and seeds. Unsalted canned beans. Lean cuts of beef with fat trimmed off. Low-sodium, lean deli meat. Dairy Low-fat (1%) or fat-free (skim) milk. Fat-free, low-fat, or reduced-fat cheeses. Nonfat, low-sodium ricotta or cottage cheese. Low-fat or nonfat yogurt. Low-fat, low-sodium cheese. Fats and oils Soft margarine without trans fats. Vegetable oil. Low-fat, reduced-fat, or light mayonnaise and salad dressings (reduced-sodium). Canola, safflower, olive, soybean, and sunflower oils. Avocado. Seasoning and other foods Herbs. Spices. Seasoning mixes without salt. Unsalted popcorn and pretzels. Fat-free sweets. What foods are not recommended? The items listed may not be a complete list. Talk with your dietitian about what dietary choices are best for you. Grains Baked goods made with fat, such as croissants, muffins, or some breads. Dry pasta or rice meal packs. Vegetables Creamed or fried vegetables. Vegetables in a cheese sauce. Regular canned vegetables (not low-sodium or reduced-sodium). Regular canned tomato sauce and paste (not low-sodium or reduced-sodium). Regular tomato and vegetable juice (not low-sodium or reduced-sodium). Pickles. Olives. Fruits Canned fruit in a light or heavy syrup. Fried fruit. Fruit in cream or butter sauce. Meat and other protein foods Fatty cuts of meat. Ribs. Fried meat. Bacon. Sausage. Bologna and other processed lunch meats. Salami. Fatback. Hotdogs. Bratwurst. Salted nuts and seeds. Canned beans with added salt. Canned or smoked fish. Whole eggs or egg yolks. Chicken or turkey with skin. Dairy Whole or 2% milk, cream, and half-and-half. Whole or full-fat cream cheese. Whole-fat or sweetened yogurt. Full-fat cheese. Nondairy creamers. Whipped toppings.  Processed cheese and cheese spreads. Fats and oils Butter. Stick margarine. Lard. Shortening. Ghee. Bacon fat. Tropical oils, such as coconut, palm kernel, or palm oil. Seasoning and other foods Salted popcorn and pretzels. Onion salt, garlic salt, seasoned salt, table salt, and sea salt. Worcestershire sauce. Tartar sauce. Barbecue sauce. Teriyaki sauce. Soy sauce, including reduced-sodium. Steak sauce. Canned and packaged gravies. Fish sauce. Oyster sauce. Cocktail sauce. Horseradish that you find on the shelf. Ketchup. Mustard. Meat flavorings and tenderizers. Bouillon cubes. Hot sauce and Tabasco sauce. Premade or packaged marinades. Premade or packaged taco seasonings. Relishes. Regular salad dressings. Where to find more information:  National Heart, Lung, and Blood Institute: www.nhlbi.nih.gov  American Heart Association: www.heart.org Summary  The DASH eating plan is a healthy eating plan that has been shown to reduce high blood pressure (hypertension). It may also reduce your risk for type 2 diabetes, heart disease, and stroke.  With the DASH eating plan, you should limit salt (sodium) intake to 2,300 mg a day. If you have hypertension, you may need to reduce your sodium intake to 1,500 mg a day.  When on the DASH eating plan, aim to eat more fresh fruits and vegetables, whole grains, lean proteins, low-fat dairy, and heart-healthy fats.  Work with your health care provider or diet and nutrition specialist (dietitian) to adjust your eating plan to your individual   calorie needs. This information is not intended to replace advice given to you by your health care provider. Make sure you discuss any questions you have with your health care provider. Document Released: 10/23/2011 Document Revised: 10/27/2016 Document Reviewed: 10/27/2016 Elsevier Interactive Patient Education  2018 [employer].  Health Risks of Smoking Smoking cigarettes is very bad for your health. Tobacco smoke has  over 200 known poisons in it. It contains the poisonous gases nitrogen oxide and carbon monoxide. There are over 60 chemicals in tobacco smoke that cause cancer. Smoking is difficult to quit because a chemical in tobacco, called nicotine, causes addiction or dependence. When you smoke and inhale, nicotine is absorbed rapidly into the bloodstream through your lungs. Both inhaled and non-inhaled nicotine may be addictive. What are the risks of cigarette smoke? Cigarette smokers have an increased risk of many serious medical problems, including:  Lung cancer.  Lung disease, such as pneumonia, bronchitis, and emphysema.  Chest pain (angina) and heart attack because the heart is not getting enough oxygen.  Heart disease and peripheral blood vessel disease.  High blood pressure (hypertension).  Stroke.  Oral cancer, including cancer of the lip, mouth, or voice box.  Bladder cancer.  Pancreatic cancer.  Cervical cancer.  Pregnancy complications, including premature birth.  Stillbirths and smaller newborn babies, birth defects, and genetic damage to sperm.  Early menopause.  Lower estrogen level for women.  Infertility.  Facial wrinkles.  Blindness.  Increased risk of broken bones (fractures).  Senile dementia.  Stomach ulcers and internal bleeding.  Delayed wound healing and increased risk of complications during surgery.  Even smoking lightly shortens your life expectancy by several years.  Because of secondhand smoke exposure, children of smokers have an increased risk of the following:  Sudden infant death syndrome (SIDS).  Respiratory infections.  Lung cancer.  Heart disease.  Ear infections.  What are the benefits of quitting? There are many health benefits of quitting smoking. Here are some of them:  Within days of quitting smoking, your risk of having a heart attack decreases, your blood flow improves, and your lung capacity improves. Blood pressure,  pulse rate, and breathing patterns start returning to normal soon after quitting.  Within months, your lungs may clear up completely.  Quitting for 10 years reduces your risk of developing lung cancer and heart disease to almost that of a nonsmoker.  People who quit may see an improvement in their overall quality of life.  How do I quit smoking? Smoking is an addiction with both physical and psychological effects, and longtime habits can be hard to change. Your health care provider can recommend:  Programs and community resources, which may include group support, education, or talk therapy.  Prescription medicines to help reduce cravings.  Nicotine replacement products, such as patches, gum, and nasal sprays. Use these products only as directed. Do not replace cigarette smoking with electronic cigarettes, which are commonly called e-cigarettes. The safety of e-cigarettes is not known, and some may contain harmful chemicals.  A combination of two or more of these methods.  Where to find more information:  American Lung Association: www.lung.org  American Cancer Society: www.cancer.org Summary  Smoking cigarettes is very bad for your health. Cigarette smokers have an increased risk of many serious medical problems, including several cancers, heart disease, and stroke.  Smoking is an addiction with both physical and psychological effects, and longtime habits can be hard to change.  By stopping right away, you can greatly reduce the risk  of medical problems for you and your family.  To help you quit smoking, your health care provider can recommend programs, community resources, prescription medicines, and nicotine replacement products such as patches, gum, and nasal sprays. This information is not intended to replace advice given to you by your health care provider. Make sure you discuss any questions you have with your health care provider. Document Released: 12/11/2004 Document  Revised: 11/07/2016 Document Reviewed: 11/07/2016 Elsevier Interactive Patient Education  2017 [employer]  Preventing Hypertension Hypertension, commonly called high blood pressure, is when the force of blood pumping through the arteries is too strong. Arteries are blood vessels that carry blood from the heart throughout the body. Over time, hypertension can damage the arteries and decrease blood flow to important parts of the body, including the brain, heart, and kidneys. Often, hypertension does not cause symptoms until blood pressure is very high. For this reason, it is important to have your blood pressure checked on a regular basis. Hypertension can often be prevented with diet and lifestyle changes. If you already have hypertension, you can control it with diet and lifestyle changes, as well as medicine. What nutrition changes can be made? Maintain a healthy diet. This includes:  Eating less salt (sodium). Ask your health care provider how much sodium is safe for you to have. The general recommendation is to consume less than 1 tsp (2,300 mg) of sodium a day. ? Do not add salt to your food. ? Choose low-sodium options when grocery shopping and eating out.  Limiting fats in your diet. You can do this by eating low-fat or fat-free dairy products and by eating less red meat.  Eating more fruits, vegetables, and whole grains. Make a goal to eat: ? 1-2 cups of fresh fruits and vegetables each day. ? 3-4 servings of whole grains each day.  Avoiding foods and beverages that have added sugars.  Eating fish that contain healthy fats (omega-3 fatty acids), such as mackerel or salmon.  If you need help putting together a healthy eating plan, try the DASH diet. This diet is high in fruits, vegetables, and whole grains. It is low in sodium, red meat, and added sugars. DASH stands for Dietary Approaches to Stop Hypertension. What lifestyle changes can be made?  Lose weight if you are  overweight. Losing just 3?5% of your body weight can help prevent or control hypertension. ? For example, if your present weight is 200 lb (91 kg), a loss of 3-5% of your weight means losing 6-10 lb (2.7-4.5 kg). ? Ask your health care provider to help you with a diet and exercise plan to safely lose weight.  Get enough exercise. Do at least 150 minutes of moderate-intensity exercise each week. ? You could do this in short exercise sessions several times a day, or you could do longer exercise sessions a few times a week. For example, you could take a brisk 10-minute walk or bike ride, 3 times a day, for 5 days a week.  Find ways to reduce stress, such as exercising, meditating, listening to music, or taking a yoga class. If you need help reducing stress, ask your health care provider.  Do not smoke. This includes e-cigarettes. Chemicals in tobacco and nicotine products raise your blood pressure each time you smoke. If you need help quitting, ask your health care provider.  Avoid alcohol. If you drink alcohol, limit alcohol intake to no more than 1 drink a day for nonpregnant women and 2 drinks a day  for men. One drink equals 12 oz of beer, 5 oz of wine, or 1 oz of hard liquor. Why are these changes important? Diet and lifestyle changes can help you prevent hypertension, and they may make you feel better overall and improve your quality of life. If you have hypertension, making these changes will help you control it and help prevent major complications, such as:  Hardening and narrowing of arteries that supply blood to: ? Your heart. This can cause a heart attack. ? Your brain. This can cause a stroke. ? Your kidneys. This can cause kidney failure.  Stress on your heart muscle, which can cause heart failure.  What can I do to lower my risk?  Work with your health care provider to make a hypertension prevention plan that works for you. Follow your plan and keep all follow-up visits as told by  your health care provider.  Learn how to check your blood pressure at home. Make sure that you know your personal target blood pressure, as told by your health care provider. How is this treated? In addition to diet and lifestyle changes, your health care provider may recommend medicines to help lower your blood pressure. You may need to try a few different medicines to find what works best for you. You also may need to take more than one medicine. Take over-the-counter and prescription medicines only as told by your health care provider. Where to find support: Your health care provider can help you prevent hypertension and help you keep your blood pressure at a healthy level. Your local hospital or your community may also provide support services and prevention programs. The American Heart Association offers an online support network at: https://www.lee.net/ Where to find more information: Learn more about hypertension from:  National Heart, Lung, and Blood Institute: https://www.peterson.org/  Centers for Disease Control and Prevention: AboutHD.co.nz  American Academy of Family Physicians: http://familydoctor.org/familydoctor/en/diseases-conditions/high-blood-pressure.printerview.all.html  Learn more about the DASH diet from:  National Heart, Lung, and Blood Institute: WedMap.it  Contact a health care provider if:  You think you are having a reaction to medicines you have taken.  You have recurrent headaches or feel dizzy.  You have swelling in your ankles.  You have trouble with your vision. Summary  Hypertension often does not cause any symptoms until blood pressure is very high. It is important to get your blood pressure checked regularly.  Diet and lifestyle changes are the most important steps in preventing hypertension.  By keeping your blood pressure in a healthy  range, you can prevent complications like heart attack, heart failure, stroke, and kidney failure.  Work with your health care provider to make a hypertension prevention plan that works for you. This information is not intended to replace advice given to you by your health care provider. Make sure you discuss any questions you have with your health care provider. Document Released: 11/18/2015 Document Revised: 07/14/2016 Document Reviewed: 07/14/2016 Elsevier Interactive Patient Education  [employer].
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
## 2018-05-26 ENCOUNTER — Telehealth: Payer: Self-pay

## 2018-05-26 NOTE — Telephone Encounter (Signed)
Pt contacted the office wanting to see if Shirley Terrell talked to Dr. Maple HudsonYoung regarding her sleep study and if the rx's were sent to [facility]

## 2018-05-27 NOTE — Telephone Encounter (Signed)
She will need to have an actual CPAP titration study for settings to be ordered. The sleep study center will contact her.

## 2018-05-28 NOTE — Telephone Encounter (Signed)
CMA attempt to call patient to inform on PCP advising.  No answer and left a VM for patient to call back.

## 2018-06-22 ENCOUNTER — Ambulatory Visit: Payer: Medicaid Other | Admitting: Cardiology

## 2018-06-29 ENCOUNTER — Ambulatory Visit (HOSPITAL_BASED_OUTPATIENT_CLINIC_OR_DEPARTMENT_OTHER): Payer: Medicaid Other | Attending: Nurse Practitioner | Admitting: Internal Medicine

## 2018-06-29 VITALS — Ht 63.0 in | Wt 230.0 lb

## 2018-06-29 DIAGNOSIS — G4763 Sleep related bruxism: Secondary | ICD-10-CM | POA: Diagnosis present

## 2018-06-29 DIAGNOSIS — G4733 Obstructive sleep apnea (adult) (pediatric): Secondary | ICD-10-CM | POA: Insufficient documentation

## 2018-07-08 ENCOUNTER — Encounter: Payer: Self-pay | Admitting: Cardiology

## 2018-07-08 ENCOUNTER — Ambulatory Visit (INDEPENDENT_AMBULATORY_CARE_PROVIDER_SITE_OTHER): Payer: Medicaid Other | Admitting: Cardiology

## 2018-07-08 ENCOUNTER — Encounter: Payer: Self-pay | Admitting: *Deleted

## 2018-07-08 VITALS — BP 120/74 | HR 97 | Ht 63.0 in | Wt 229.0 lb

## 2018-07-08 DIAGNOSIS — Z720 Tobacco use: Secondary | ICD-10-CM | POA: Diagnosis not present

## 2018-07-08 DIAGNOSIS — G4733 Obstructive sleep apnea (adult) (pediatric): Secondary | ICD-10-CM

## 2018-07-08 DIAGNOSIS — I10 Essential (primary) hypertension: Secondary | ICD-10-CM | POA: Diagnosis not present

## 2018-07-08 NOTE — Progress Notes (Signed)
Cardiology Office Note:    Date:  07/08/2018   ID:  Shirley Terrell, DOB 1986-05-04, MRN [id number]  PCP:  Claiborne Rigg, NP  Cardiologist:  Donato Schultz, MD   Referring MD: Claiborne Rigg, NP     History of Present Illness:    Shirley Terrell is a 32 y.o. female here for follow-up previously seen by Shirley Loots, NP.  She was seen at that time for preoperative evaluation as well as hypertension at the request of Dr. Doylene Canard of surgery.  Blood pressure was 140/92 at last visit but it is been as high as 200/110.  Was better controlled since medication change.  Occasionally may have some chest discomfort at random, GERD-like.  Left ankle always bigger than right related to prior foot surgery.  She works part-time at [employer].  She has had smoking.  Amlodipine was added at prior clinic.  Discussion of morbid obesity   Overall she has been doing very well with her blood pressure control.  Blood pressure today 120/74.  She is having occasional headache but overall feels better.  We were able to find her sleep study results from February and it said mild sleep apnea which may be treated with conservative measures.    Past Medical History:  Diagnosis Date  . Anemia    takes iron supplement  . Asthma    prn inhaler  . Hypertension    states BP normalized while pregnant and since delivery; is currently not on med.  . Lisfranc dislocation 11/30/2016   left    Past Surgical History:  Procedure Laterality Date  . CHOLECYSTECTOMY  04/13/2012   Procedure: LAPAROSCOPIC CHOLECYSTECTOMY WITH INTRAOPERATIVE CHOLANGIOGRAM;  Surgeon: Adolph Pollack, MD;  Location: [facility] OR;  Service: General;  Laterality: N/A;  laparoscopic cholecystectomy with IOC  . OPEN REDUCTION INTERNAL FIXATION (ORIF) FOOT LISFRANC FRACTURE Left 12/18/2016   Procedure: OPEN REDUCTION INTERNAL FIXATION (ORIF) FOOT LISFRANC FRACTURE;  Surgeon: Toni Arthurs, MD;  Location: [facility];  Service: Orthopedics;   Laterality: Left;    Current Medications: Current Meds  Medication Sig  . acetaminophen (TYLENOL) 500 MG tablet Take 1,000 mg by mouth every 6 (six) hours as needed for moderate pain or headache.  . albuterol (PROVENTIL HFA;VENTOLIN HFA) 108 (90 BASE) MCG/ACT inhaler Inhale 2 puffs into the lungs every 6 (six) hours as needed for wheezing. Reported on 04/11/2016  . carvedilol (COREG) 12.5 MG tablet Take 1 tablet (12.5 mg total) by mouth 2 (two) times daily with a meal.  . Ferrous Sulfate (IRON SUPPLEMENT PO) Take 1 tablet by mouth daily.   . hydrochlorothiazide (HYDRODIURIL) 25 MG tablet Take 1 tablet (25 mg total) by mouth daily.  . nicotine (NICODERM CQ - DOSED IN MG/24 HOURS) 14 mg/24hr patch Place 1 patch (14 mg total) onto the skin daily.     Allergies:   Patient has no known allergies.   Social History   Socioeconomic History  . Marital status: Single    Spouse name: Not on file  . Number of children: Not on file  . Years of education: Not on file  . Highest education level: Not on file  Occupational History  . Not on file  Social Needs  . Financial resource strain: Not on file  . Food insecurity:    Worry: Not on file    Inability: Not on file  . Transportation needs:    Medical: Not on file    Non-medical: Not on file  Tobacco Use  . Smoking status: Current Some Day Smoker    Packs/day: 0.00    Years: 3.00    Pack years: 0.00    Types: Cigarettes    Last attempt to quit: 12/14/2017    Years since quitting: 0.5  . Smokeless tobacco: Never Used  . Tobacco comment: 6 cigarettes/day  Substance and Sexual Activity  . Alcohol use: Yes    Alcohol/week: 0.0 standard drinks    Comment: occasionally  . Drug use: No  . Sexual activity: Yes    Partners: Male    Birth control/protection: Implant  Lifestyle  . Physical activity:    Days per week: Not on file    Minutes per session: Not on file  . Stress: Not on file  Relationships  . Social connections:    Talks on  phone: Not on file    Gets together: Not on file    Attends religious service: Not on file    Active member of club or organization: Not on file    Attends meetings of clubs or organizations: Not on file    Relationship status: Not on file  Other Topics Concern  . Not on file  Social History Narrative   She is single with 2 daughters, age 51 yrs and 1 yr in 2019.      Family History: The patient's family history includes Cancer in her paternal grandfather and paternal grandmother; Depression in her father; Diabetes in her other and paternal grandmother; Healthy in her sister, sister, sister, and sister; Heart disease in her father; Hypertension in her father, maternal grandmother, and mother; Renal Disease in her mother; Stroke in her father and mother.  ROS:   Please see the history of present illness.     All other systems reviewed and are negative.  EKGs/Labs/Other Studies Reviewed:    The following studies were reviewed today: Prior office notes, lab work, EKG reviewed  EKG: No new EKG  Recent Labs: 12/28/2017: BUN 15; Creatinine, Ser 0.66; Hemoglobin 13.9; Platelets 264; Potassium 4.2; Sodium 137  Recent Lipid Panel    Component Value Date/Time   CHOL 139 12/22/2017 0944   TRIG 60 12/22/2017 0944   HDL 51 12/22/2017 0944   CHOLHDL 2.7 12/22/2017 0944   LDLCALC 76 12/22/2017 0944    Physical Exam:    VS:  BP 120/74   Pulse 97   Ht 5\' 3"  (1.6 m)   Wt 229 lb (103.9 kg)   SpO2 98%   BMI 40.57 kg/m     Wt Readings from Last 3 Encounters:  07/08/18 229 lb (103.9 kg)  06/29/18 230 lb (104.3 kg)  05/24/18 227 lb 6.4 oz (103.1 kg)     GEN: Well nourished, well developed in no acute distress, obese HEENT: Normal NECK: No JVD; No carotid bruits LYMPHATICS: No lymphadenopathy CARDIAC: RRR, no murmurs, rubs, gallops RESPIRATORY:  Clear to auscultation without rales, wheezing or rhonchi  ABDOMEN: Soft, non-tender, non-distended MUSCULOSKELETAL:  No edema; No  deformity  SKIN: Warm and dry NEUROLOGIC:  Alert and oriented x 3 PSYCHIATRIC:  Normal affect   ASSESSMENT:    1. Essential hypertension   2. Obstructive sleep apnea syndrome, mild   3. Tobacco abuse   4. Morbid obesity (HCC)    PLAN:    In order of problems listed above:  Essential hypertension -Currently well controlled.  Excellent regimen.  No changes made.  Morbid obesity -Continue to encourage weight loss.  As weight loss occurs, blood pressure control  will become better and better.  She will likely be able to come off of some of these medications in the future.  She feels as though her implantable birth control in her arm is causing her to continue to be at 230 pounds.  She is trying to use the DASH diet.  We discussed low carbohydrates.  Tobacco use  - down to 6 a week. Much better.   OK to proceed with hernia repair at this point given her well-controlled hypertension, low overall cardiac risk.   Medication Adjustments/Labs and Tests Ordered: Current medicines are reviewed at length with the patient today.  Concerns regarding medicines are outlined above.  No orders of the defined types were placed in this encounter.  No orders of the defined types were placed in this encounter.   Patient Instructions  Medication Instructions:  The current medical regimen is effective;  continue present plan and medications.  Follow-Up: Follow up as needed.  Thank you for choosing [facility]!!        Signed, Donato SchultzMark Skains, MD  07/08/2018 5:12 PM    [facility]

## 2018-07-08 NOTE — Patient Instructions (Signed)
Medication Instructions:  The current medical regimen is effective;  continue present plan and medications.  Follow-Up: Follow up as needed.  Thank you for choosing [facility]!!     

## 2018-07-13 DIAGNOSIS — G4733 Obstructive sleep apnea (adult) (pediatric): Secondary | ICD-10-CM

## 2018-07-13 NOTE — Procedures (Signed)
Patient Name: Shirley Terrell, Shirley Terrell Study Date: 06/29/2018 Gender: Female D.O.B: 12-06-1985 Age (years): 31 Referring Provider: Claiborne RiggZelda W Fleming NP Height (inches): 63 Interpreting Physician: Jetty Duhamellinton Young MD, ABSM Weight (lbs): 275 RPSGT: Lowry RamMckinney, Takeya BMI: 49 MRN: [id number] Neck Size: 15.00  CLINICAL INFORMATION The patient is referred for a CPAP titration to treat sleep apnea.  Date of NPSG, Split Night or HST: 01/13/18  AHI 8.3/ hr, desaturation to 80%, body weight 275 lbs  SLEEP STUDY TECHNIQUE As per the AASM Manual for the Scoring of Sleep and Associated Events v2.3 (April 2016) with a hypopnea requiring 4% desaturations.  The channels recorded and monitored were frontal, central and occipital EEG, electrooculogram (EOG), submentalis EMG (chin), nasal and oral airflow, thoracic and abdominal wall motion, anterior tibialis EMG, snore microphone, electrocardiogram, and pulse oximetry. Continuous positive airway pressure (CPAP) was initiated at the beginning of the study and titrated to treat sleep-disordered breathing.  MEDICATIONS Medications self-administered by patient taken the night of the study : CARVEDILOL  TECHNICIAN COMMENTS Comments added by technician: NONE Comments added by scorer: N/A RESPIRATORY PARAMETERS Optimal PAP Pressure (cm): 12 AHI at Optimal Pressure (/hr): 0.0 Overall Minimal O2 (%): 92.0 Supine % at Optimal Pressure (%): 25 Minimal O2 at Optimal Pressure (%): 94.0   SLEEP ARCHITECTURE The study was initiated at 10:58:35 PM and ended at 5:49:13 AM.  Sleep onset time was 8.9 minutes and the sleep efficiency was 93.0%%. The total sleep time was 382 minutes.  The patient spent 5.1%% of the night in stage N1 sleep, 64.5%% in stage N2 sleep, 11.3%% in stage N3 and 19.1% in REM.Stage REM latency was 74.5 minutes  Wake after sleep onset was 19.7. Alpha intrusion was absent. Supine sleep was 62.90%.  CARDIAC DATA The 2 lead EKG demonstrated sinus  rhythm. The mean heart rate was 73.4 beats per minute. Other EKG findings include: PVCs.  LEG MOVEMENT DATA The total Periodic Limb Movements of Sleep (PLMS) were 2. The PLMS index was 0.3. A PLMS index of <15 is considered normal in adults.  IMPRESSIONS - The optimal PAP pressure was 12 cm of water. - Central sleep apnea was not noted during this titration (CAI = 0.0/h). - Significant oxygen desaturations were not observed during this titration (min O2 = 92.0%). - The patient snored with soft snoring volume during this titration study. - 2-lead EKG demonstrated: PVCs - Clinically significant periodic limb movements were not noted during this study. Arousals associated with PLMs were rare. 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 Patient used a Medium Cushion size [employer] Respironics Minimal Contact Dreamwear Under Nose Frame (M) mask and heated humidification. - Consider an oral guard for Bruxism. - Avoid alcohol, sedatives and other CNS depressants that may worsen sleep apnea and disrupt normal sleep architecture. - Sleep hygiene should be reviewed to assess factors that may improve sleep quality. - Weight management and regular exercise should be initiated or continued.   [Electronically signed] 07/13/2018 03:42 PM  Jetty Duhamellinton Young MD, ABSM Diplomate, American Board of Sleep Medicine   NPI: [id number]                          Jetty Duhamellinton Young Diplomate, American Board of Sleep Medicine  ELECTRONICALLY SIGNED ON:  07/13/2018, 3:38 PM [facility] PH: [phone]   FX: [phone]  [phone] [phone] ACCREDITED BY THE AMERICAN ACADEMY OF SLEEP MEDICINE

## 2018-07-14 ENCOUNTER — Other Ambulatory Visit: Payer: Self-pay | Admitting: Nurse Practitioner

## 2018-07-14 MED ORDER — MISC. DEVICES MISC: 0 refills | Status: DC

## 2018-07-15 ENCOUNTER — Other Ambulatory Visit: Payer: Self-pay

## 2018-07-15 ENCOUNTER — Telehealth: Payer: Self-pay

## 2018-07-15 MED ORDER — MISC. DEVICES MISC: 0 refills | Status: DC

## 2018-07-15 NOTE — Telephone Encounter (Signed)
CMA spoke to patient to inform that her script is ready for pick up.  Patient understood.

## 2018-07-15 NOTE — Telephone Encounter (Signed)
-----   Message from Claiborne RiggZelda W Fleming, NP sent at 07/14/2018 11:09 PM EDT ----- Please let patient know her CPAP script is ready. It can be printed from her medication list under miscellaneous

## 2018-08-05 ENCOUNTER — Ambulatory Visit: Payer: Self-pay | Admitting: Surgery

## 2018-08-05 NOTE — H&P (View-Only) (Signed)
Surgical H&P  CC: ventral hernia  HPI: this is a pleasant 32 year old woman who I met initially in January of this year and had planned a laparoscopic repair of a supraumbilical hernia.  She had first noticed it about 2 years ago right after her last baby was born.  She's had a laparoscopic cholecystectomy but no other surgeries.  She has cut down her cigarette use to about 3 cigarettes a month which is a great improvement.  She has also been trying to lose weight but has not been able to break below 2:30.  Her surgery was initially scheduled in March of this year but had to be deferred due to need for cardiac clearance she was found to have acutely elevated blood pressure. She has been working with cardiology and her blood pressure has been better controlled. She has also been started on CPAP she was found to have sleep apnea. She has been cleared for surgery by cardiology (Dr. Marlou Porch, 07/08/18)  She is working at [employer] in housekeeping.  She states that she will be able to work light duty after surgery.  No Known Allergies  Past Medical History:  Diagnosis Date  . Anemia    takes iron supplement  . Asthma    prn inhaler  . Hypertension    states BP normalized while pregnant and since delivery; is currently not on med.  . Lisfranc dislocation 11/30/2016   left    Past Surgical History:  Procedure Laterality Date  . CHOLECYSTECTOMY  04/13/2012   Procedure: LAPAROSCOPIC CHOLECYSTECTOMY WITH INTRAOPERATIVE CHOLANGIOGRAM;  Surgeon: Odis Hollingshead, MD;  Location: [place];  Service: General;  Laterality: N/A;  laparoscopic cholecystectomy with IOC  . OPEN REDUCTION INTERNAL FIXATION (ORIF) FOOT LISFRANC FRACTURE Left 12/18/2016   Procedure: OPEN REDUCTION INTERNAL FIXATION (ORIF) FOOT LISFRANC FRACTURE;  Surgeon: Wylene Simmer, MD;  Location: [place];  Service: Orthopedics;  Laterality: Left;    Family History  Problem Relation Age of Onset  . Hypertension Mother   . Stroke  Mother   . Renal Disease Mother        due to blood pressure  . Heart disease Father        MI, no stents  . Depression Father   . Hypertension Father   . Stroke Father   . Cancer Paternal Grandmother        breast  . Diabetes Paternal Grandmother   . Cancer Paternal Grandfather   . Hypertension Maternal Grandmother   . Diabetes Other   . Healthy Sister   . Healthy Sister   . Healthy Sister   . Healthy Sister     Social History   Socioeconomic History  . Marital status: Single    Spouse name: Not on file  . Number of children: Not on file  . Years of education: Not on file  . Highest education level: Not on file  Occupational History  . Not on file  Social Needs  . Financial resource strain: Not on file  . Food insecurity:    Worry: Not on file    Inability: Not on file  . Transportation needs:    Medical: Not on file    Non-medical: Not on file  Tobacco Use  . Smoking status: Current Some Day Smoker    Packs/day: 0.00    Years: 3.00    Pack years: 0.00    Types: Cigarettes    Last attempt to quit: 12/14/2017    Years since quitting:  0.6  . Smokeless tobacco: Never Used  . Tobacco comment: 6 cigarettes/day  Substance and Sexual Activity  . Alcohol use: Yes    Alcohol/week: 0.0 standard drinks    Comment: occasionally  . Drug use: No  . Sexual activity: Yes    Partners: Male    Birth control/protection: Implant  Lifestyle  . Physical activity:    Days per week: Not on file    Minutes per session: Not on file  . Stress: Not on file  Relationships  . Social connections:    Talks on phone: Not on file    Gets together: Not on file    Attends religious service: Not on file    Active member of club or organization: Not on file    Attends meetings of clubs or organizations: Not on file    Relationship status: Not on file  Other Topics Concern  . Not on file  Social History Narrative   She is single with 2 daughters, age 43 yrs and 1 yr in 2019.      Current Outpatient Medications on File Prior to Visit  Medication Sig Dispense Refill  . acetaminophen (TYLENOL) 500 MG tablet Take 1,000 mg by mouth every 6 (six) hours as needed for moderate pain or headache.    . albuterol (PROVENTIL HFA;VENTOLIN HFA) 108 (90 BASE) MCG/ACT inhaler Inhale 2 puffs into the lungs every 6 (six) hours as needed for wheezing. Reported on 04/11/2016    . amLODipine (NORVASC) 5 MG tablet Take 1 tablet (5 mg total) by mouth daily. 90 tablet 3  . carvedilol (COREG) 12.5 MG tablet Take 1 tablet (12.5 mg total) by mouth 2 (two) times daily with a meal. 60 tablet 3  . Ferrous Sulfate (IRON SUPPLEMENT PO) Take 1 tablet by mouth daily.     . hydrochlorothiazide (HYDRODIURIL) 25 MG tablet Take 1 tablet (25 mg total) by mouth daily. 90 tablet 3  . Misc. Devices MISC Please provide patient with an insurance approved CPAP.  Settings should be as follows: DME autopap 5-15.  Medium Cushion size Philips Respironics Minimal Architectural technologist Under Nose Frame (M) mask and heated humidification. 1 each 0  . nicotine (NICODERM CQ - DOSED IN MG/24 HOURS) 14 mg/24hr patch Place 1 patch (14 mg total) onto the skin daily. 42 patch 0   No current facility-administered medications on file prior to visit.     Review of Systems: a complete, 10pt review of systems was completed with pertinent positives and negatives as documented in the HPI  Physical Exam: There were no vitals filed for this visit. Gen: A&Ox3, no distress  Head: normocephalic, atraumatic Eyes: extraocular motions intact, anicteric.  Neck: supple without mass or thyromegaly Chest: unlabored respirations, symmetrical air entry, clear bilaterally   Cardiovascular: RRR with palpable distal pulses, no pedal edema Abdomen: obese, soft, nondistended, nontender. No mass or organomegaly. Reducible supraumbilical hernia which has increased in size since last visit.  Extremities: warm, without edema, no deformities  Neuro:  grossly intact Psych: appropriate mood and affect, normal insight  Skin: warm and dry   CBC Latest Ref Rng & Units 12/28/2017 12/22/2017 12/18/2016  WBC 4.0 - 10.5 K/uL 10.3 9.7 -  Hemoglobin 12.0 - 15.0 g/dL 13.9 13.8 12.4  Hematocrit 36.0 - 46.0 % 41.3 41.2 -  Platelets 150 - 400 K/uL 264 278 -    CMP Latest Ref Rng & Units 12/28/2017 12/22/2017 02/07/2016  Glucose 65 - 99 mg/dL 99 91 91  BUN  6 - 20 mg/dL 15 10 5(L)  Creatinine 0.44 - 1.00 mg/dL 0.66 0.67 0.55  Sodium 135 - 145 mmol/L 137 139 137  Potassium 3.5 - 5.1 mmol/L 4.2 4.3 4.1  Chloride 101 - 111 mmol/L 104 103 105  CO2 22 - 32 mmol/L _0 Calcium 8.9 - 10.3 mg/dL 9.2 9.3 8.7  Total Protein 6.1 - 8.1 g/dL - - 6.2  Total Bilirubin 0.2 - 1.2 mg/dL - - 0.3  Alkaline Phos 33 - 115 U/L - - 73  AST 10 - 30 U/L - - 18  ALT 6 - 29 U/L - - 31(H)    Lab Results  Component Value Date   INR 0.96 04/01/2012    Imaging: No results found.   A/P: Supraumbilical hernia, likely from lap chole incision. She remains a candidate for laparoscopic repair with mesh.  I again discussed with her the risks of bleeding, infection, pain, scarring, intra-abdominal injury or scar tissue formation, hernia recurrence especially in the setting of ongoing nicotine use albeit improved, and abdominal girth.  We discussed the indication for use of mesh and she had some questions about the risks of that. Discussed light duty after work for about 6 weeks.    Romana Juniper, MD [facility], [place] Pager [phone]

## 2018-08-05 NOTE — H&P (Signed)
Surgical H&P  CC: ventral hernia  HPI: this is a pleasant 32-year-old woman who I met initially in January of this year and had planned a laparoscopic repair of a supraumbilical hernia.  She had first noticed it about 2 years ago right after her last baby was born.  She's had a laparoscopic cholecystectomy but no other surgeries.  She has cut down her cigarette use to about 3 cigarettes a month which is a great improvement.  She has also been trying to lose weight but has not been able to break below 2:30.  Her surgery was initially scheduled in March of this year but had to be deferred due to need for cardiac clearance she was found to have acutely elevated blood pressure. She has been working with cardiology and her blood pressure has been better controlled. She has also been started on CPAP she was found to have sleep apnea. She has been cleared for surgery by cardiology (Dr. Skains, 07/08/18)  She is working at [employer] in housekeeping.  She states that she will be able to work light duty after surgery.  No Known Allergies  Past Medical History:  Diagnosis Date  . Anemia    takes iron supplement  . Asthma    prn inhaler  . Hypertension    states BP normalized while pregnant and since delivery; is currently not on med.  . Lisfranc dislocation 11/30/2016   left    Past Surgical History:  Procedure Laterality Date  . CHOLECYSTECTOMY  04/13/2012   Procedure: LAPAROSCOPIC CHOLECYSTECTOMY WITH INTRAOPERATIVE CHOLANGIOGRAM;  Surgeon: Todd J Rosenbower, MD;  Location[facility];  Service: General;  Laterality: N/A;  laparoscopic cholecystectomy with IOC  . OPEN REDUCTION INTERNAL FIXATION (ORIF) FOOT LISFRANC FRACTURE Left 12/18/2016   Procedure: OPEN REDUCTION INTERNAL FIXATION (ORIF) FOOT LISFRANC FRACTURE;  Surgeon: John Hewitt, MD;  Location: [facility];  Service: Orthopedics;  Laterality: Left;    Family History  Problem Relation Age of Onset  . Hypertension Mother   . Stroke  Mother   . Renal Disease Mother        due to blood pressure  . Heart disease Father        MI, no stents  . Depression Father   . Hypertension Father   . Stroke Father   . Cancer Paternal Grandmother        breast  . Diabetes Paternal Grandmother   . Cancer Paternal Grandfather   . Hypertension Maternal Grandmother   . Diabetes Other   . Healthy Sister   . Healthy Sister   . Healthy Sister   . Healthy Sister     Social History   Socioeconomic History  . Marital status: Single    Spouse name: Not on file  . Number of children: Not on file  . Years of education: Not on file  . Highest education level: Not on file  Occupational History  . Not on file  Social Needs  . Financial resource strain: Not on file  . Food insecurity:    Worry: Not on file    Inability: Not on file  . Transportation needs:    Medical: Not on file    Non-medical: Not on file  Tobacco Use  . Smoking status: Current Some Day Smoker    Packs/day: 0.00    Years: 3.00    Pack years: 0.00    Types: Cigarettes    Last attempt to quit: 12/14/2017    Years since quitting:   0.6  . Smokeless tobacco: Never Used  . Tobacco comment: 6 cigarettes/day  Substance and Sexual Activity  . Alcohol use: Yes    Alcohol/week: 0.0 standard drinks    Comment: occasionally  . Drug use: No  . Sexual activity: Yes    Partners: Male    Birth control/protection: Implant  Lifestyle  . Physical activity:    Days per week: Not on file    Minutes per session: Not on file  . Stress: Not on file  Relationships  . Social connections:    Talks on phone: Not on file    Gets together: Not on file    Attends religious service: Not on file    Active member of club or organization: Not on file    Attends meetings of clubs or organizations: Not on file    Relationship status: Not on file  Other Topics Concern  . Not on file  Social History Narrative   She is single with 2 daughters, age 9 yrs and 1 yr in 2019.      Current Outpatient Medications on File Prior to Visit  Medication Sig Dispense Refill  . acetaminophen (TYLENOL) 500 MG tablet Take 1,000 mg by mouth every 6 (six) hours as needed for moderate pain or headache.    . albuterol (PROVENTIL HFA;VENTOLIN HFA) 108 (90 BASE) MCG/ACT inhaler Inhale 2 puffs into the lungs every 6 (six) hours as needed for wheezing. Reported on 04/11/2016    . amLODipine (NORVASC) 5 MG tablet Take 1 tablet (5 mg total) by mouth daily. 90 tablet 3  . carvedilol (COREG) 12.5 MG tablet Take 1 tablet (12.5 mg total) by mouth 2 (two) times daily with a meal. 60 tablet 3  . Ferrous Sulfate (IRON SUPPLEMENT PO) Take 1 tablet by mouth daily.     . hydrochlorothiazide (HYDRODIURIL) 25 MG tablet Take 1 tablet (25 mg total) by mouth daily. 90 tablet 3  . Misc. Devices MISC Please provide patient with an insurance approved CPAP.  Settings should be as follows: DME autopap 5-15.  Medium Cushion size Philips Respironics Minimal Contact  Dreamwear Under Nose Frame (M) mask and heated humidification. 1 each 0  . nicotine (NICODERM CQ - DOSED IN MG/24 HOURS) 14 mg/24hr patch Place 1 patch (14 mg total) onto the skin daily. 42 patch 0   No current facility-administered medications on file prior to visit.     Review of Systems: a complete, 10pt review of systems was completed with pertinent positives and negatives as documented in the HPI  Physical Exam: There were no vitals filed for this visit. Gen: A&Ox3, no distress  Head: normocephalic, atraumatic Eyes: extraocular motions intact, anicteric.  Neck: supple without mass or thyromegaly Chest: unlabored respirations, symmetrical air entry, clear bilaterally   Cardiovascular: RRR with palpable distal pulses, no pedal edema Abdomen: obese, soft, nondistended, nontender. No mass or organomegaly. Reducible supraumbilical hernia which has increased in size since last visit.  Extremities: warm, without edema, no deformities  Neuro:  grossly intact Psych: appropriate mood and affect, normal insight  Skin: warm and dry   CBC Latest Ref Rng & Units 12/28/2017 12/22/2017 12/18/2016  WBC 4.0 - 10.5 K/uL 10.3 9.7 -  Hemoglobin 12.0 - 15.0 g/dL 13.9 13.8 12.4  Hematocrit 36.0 - 46.0 % 41.3 41.2 -  Platelets 150 - 400 K/uL 264 278 -    CMP Latest Ref Rng & Units 12/28/2017 12/22/2017 02/07/2016  Glucose 65 - 99 mg/dL 99 91 91  BUN   6 - 20 mg/dL 15 10 5(L)  Creatinine 0.44 - 1.00 mg/dL 0.66 0.67 0.55  Sodium 135 - 145 mmol/L 137 139 137  Potassium 3.5 - 5.1 mmol/L 4.2 4.3 4.1  Chloride 101 - 111 mmol/L 104 103 105  CO2 22 - 32 mmol/L 24 21 24  Calcium 8.9 - 10.3 mg/dL 9.2 9.3 8.7  Total Protein 6.1 - 8.1 g/dL - - 6.2  Total Bilirubin 0.2 - 1.2 mg/dL - - 0.3  Alkaline Phos 33 - 115 U/L - - 73  AST 10 - 30 U/L - - 18  ALT 6 - 29 U/L - - 31(H)    Lab Results  Component Value Date   INR 0.96 04/01/2012    Imaging: No results found.   A/P: Supraumbilical hernia, likely from lap chole incision. She remains a candidate for laparoscopic repair with mesh.  I again discussed with her the risks of bleeding, infection, pain, scarring, intra-abdominal injury or scar tissue formation, hernia recurrence especially in the setting of ongoing nicotine use albeit improved, and abdominal girth.  We discussed the indication for use of mesh and she had some questions about the risks of that. Discussed light duty after work for about 6 weeks.    Raeli Wiens, MD [facility] Pager [phone]  

## 2018-08-12 ENCOUNTER — Encounter (HOSPITAL_COMMUNITY): Payer: Self-pay | Admitting: *Deleted

## 2018-08-12 ENCOUNTER — Other Ambulatory Visit: Payer: Self-pay

## 2018-08-12 MED ORDER — BUPIVACAINE LIPOSOME 1.3 % IJ SUSP
20.0000 mL | INTRAMUSCULAR | Status: DC
  Filled 2018-08-12: qty 20

## 2018-08-12 NOTE — Progress Notes (Signed)
Spoke with pt for pre-op call. Pt does not have a cardiac hx, but sees Dr. Anne Fu for HTN. He has given her cardiac clearance for surgery. Pt states she is not diabetic.

## 2018-08-13 ENCOUNTER — Other Ambulatory Visit: Payer: Self-pay

## 2018-08-13 ENCOUNTER — Encounter (HOSPITAL_COMMUNITY)
Admission: RE | Disposition: A | Discharge status: Home / Self Care | Payer: Self-pay | Source: Ambulatory Visit | Attending: Surgery

## 2018-08-13 ENCOUNTER — Ambulatory Visit (HOSPITAL_COMMUNITY)
Admission: RE | Admit: 2018-08-13 | Discharge: 2018-08-14 | Disposition: A | Discharge status: Home / Self Care | Payer: Medicaid Other | Source: Ambulatory Visit | Attending: Surgery | Admitting: Surgery

## 2018-08-13 ENCOUNTER — Ambulatory Visit (HOSPITAL_COMMUNITY): Payer: Medicaid Other | Admitting: Anesthesiology

## 2018-08-13 ENCOUNTER — Encounter (HOSPITAL_COMMUNITY): Payer: Self-pay

## 2018-08-13 DIAGNOSIS — F17210 Nicotine dependence, cigarettes, uncomplicated: Secondary | ICD-10-CM | POA: Diagnosis not present

## 2018-08-13 DIAGNOSIS — K439 Ventral hernia without obstruction or gangrene: Secondary | ICD-10-CM | POA: Diagnosis present

## 2018-08-13 DIAGNOSIS — J45909 Unspecified asthma, uncomplicated: Secondary | ICD-10-CM | POA: Diagnosis not present

## 2018-08-13 DIAGNOSIS — I10 Essential (primary) hypertension: Secondary | ICD-10-CM | POA: Diagnosis not present

## 2018-08-13 DIAGNOSIS — K430 Incisional hernia with obstruction, without gangrene: Secondary | ICD-10-CM | POA: Insufficient documentation

## 2018-08-13 DIAGNOSIS — Z79899 Other long term (current) drug therapy: Secondary | ICD-10-CM | POA: Insufficient documentation

## 2018-08-13 DIAGNOSIS — G4730 Sleep apnea, unspecified: Secondary | ICD-10-CM | POA: Insufficient documentation

## 2018-08-13 HISTORY — PX: INCISIONAL HERNIA REPAIR: SHX193

## 2018-08-13 HISTORY — PX: INSERTION OF MESH: SHX5868

## 2018-08-13 HISTORY — DX: Sleep apnea, unspecified: G47.30

## 2018-08-13 LAB — BASIC METABOLIC PANEL
Anion gap: 10 (ref 5–15)
BUN: 7 mg/dL (ref 6–20)
CHLORIDE: 104 mmol/L (ref 98–111)
CO2: 22 mmol/L (ref 22–32)
CREATININE: 0.68 mg/dL (ref 0.44–1.00)
Calcium: 9.2 mg/dL (ref 8.9–10.3)
GFR calc Af Amer: >60 mL/min (ref >60)
GFR calc non Af Amer: >60 mL/min (ref >60)
GLUCOSE: 107 mg/dL — AB (ref 70–99)
Potassium: 3.7 mmol/L (ref 3.5–5.1)
SODIUM: 136 mmol/L (ref 135–145)

## 2018-08-13 LAB — CBC WITH DIFFERENTIAL/PLATELET
Abs Immature Granulocytes: 0 10*3/uL (ref 0.0–0.1)
Basophils Absolute: 0.1 10*3/uL (ref 0.0–0.1)
Basophils Relative: 1 %
EOS ABS: 0.2 10*3/uL (ref 0.0–0.7)
EOS PCT: 2 %
HCT: 40 % (ref 36.0–46.0)
HEMOGLOBIN: 12.8 g/dL (ref 12.0–15.0)
Immature Granulocytes: 0 %
Lymphocytes Relative: 19 %
Lymphs Abs: 2 10*3/uL (ref 0.7–4.0)
MCH: 30.6 pg (ref 26.0–34.0)
MCHC: 32 g/dL (ref 30.0–36.0)
MCV: 95.7 fL (ref 78.0–100.0)
MONO ABS: 0.6 10*3/uL (ref 0.1–1.0)
Monocytes Relative: 6 %
Neutro Abs: 7.8 10*3/uL — ABNORMAL HIGH (ref 1.7–7.7)
Neutrophils Relative %: 72 %
Platelets: 278 10*3/uL (ref 150–400)
RBC: 4.18 MIL/uL (ref 3.87–5.11)
RDW: 11.8 % (ref 11.5–15.5)
WBC: 10.7 10*3/uL — AB (ref 4.0–10.5)

## 2018-08-13 LAB — POCT PREGNANCY, URINE: Preg Test, Ur: NEGATIVE

## 2018-08-13 SURGERY — REPAIR, HERNIA, INCISIONAL, LAPAROSCOPIC
Anesthesia: General | Site: Abdomen

## 2018-08-13 MED ORDER — SUGAMMADEX SODIUM 200 MG/2ML IV SOLN
INTRAVENOUS | Status: DC | PRN
  Administered 2018-08-13: 220 mg via INTRAVENOUS

## 2018-08-13 MED ORDER — BISACODYL 10 MG RE SUPP: 10.0000 mg | Freq: Every day | RECTAL | Status: DC | PRN

## 2018-08-13 MED ORDER — MEPERIDINE HCL 50 MG/ML IJ SOLN: 6.2500 mg | INTRAMUSCULAR | Status: DC | PRN

## 2018-08-13 MED ORDER — AMLODIPINE BESYLATE 5 MG PO TABS
5.0000 mg | ORAL_TABLET | Freq: Every day | ORAL | Status: DC
  Administered 2018-08-14: 5 mg via ORAL
  Filled 2018-08-13: qty 1

## 2018-08-13 MED ORDER — BUPIVACAINE-EPINEPHRINE (PF) 0.25% -1:200000 IJ SOLN
INTRAMUSCULAR | Status: AC
  Filled 2018-08-13: qty 30

## 2018-08-13 MED ORDER — PROPOFOL 10 MG/ML IV BOLUS
INTRAVENOUS | Status: DC | PRN
  Administered 2018-08-13: 150 mg via INTRAVENOUS

## 2018-08-13 MED ORDER — FENTANYL CITRATE (PF) 250 MCG/5ML IJ SOLN
INTRAMUSCULAR | Status: AC
  Filled 2018-08-13: qty 5

## 2018-08-13 MED ORDER — CELECOXIB 200 MG PO CAPS
200.0000 mg | ORAL_CAPSULE | ORAL | Status: AC
  Administered 2018-08-13: 200 mg via ORAL

## 2018-08-13 MED ORDER — DEXMEDETOMIDINE HCL IN NACL 200 MCG/50ML IV SOLN
INTRAVENOUS | Status: DC | PRN
  Administered 2018-08-13: 12 ug via INTRAVENOUS

## 2018-08-13 MED ORDER — ZOLPIDEM TARTRATE 5 MG PO TABS: 5.0000 mg | ORAL_TABLET | Freq: Every evening | ORAL | Status: DC | PRN

## 2018-08-13 MED ORDER — MIDAZOLAM HCL 2 MG/2ML IJ SOLN
INTRAMUSCULAR | Status: AC
  Filled 2018-08-13: qty 2

## 2018-08-13 MED ORDER — ONDANSETRON HCL 4 MG/2ML IJ SOLN
INTRAMUSCULAR | Status: DC | PRN
  Administered 2018-08-13: 4 mg via INTRAVENOUS

## 2018-08-13 MED ORDER — HYDROMORPHONE HCL 1 MG/ML IJ SOLN
INTRAMUSCULAR | Status: AC
  Administered 2018-08-13: 0.5 mg via INTRAVENOUS
  Filled 2018-08-13: qty 1

## 2018-08-13 MED ORDER — OXYCODONE HCL 5 MG PO TABS
5.0000 mg | ORAL_TABLET | Freq: Four times a day (QID) | ORAL | Status: DC | PRN
  Administered 2018-08-13: 5 mg via ORAL
  Filled 2018-08-13: qty 1

## 2018-08-13 MED ORDER — FENTANYL CITRATE (PF) 250 MCG/5ML IJ SOLN
INTRAMUSCULAR | Status: DC | PRN
  Administered 2018-08-13: 50 ug via INTRAVENOUS
  Administered 2018-08-13: 100 ug via INTRAVENOUS
  Administered 2018-08-13: 50 ug via INTRAVENOUS
  Administered 2018-08-13: 100 ug via INTRAVENOUS
  Administered 2018-08-13: 50 ug via INTRAVENOUS

## 2018-08-13 MED ORDER — ONDANSETRON 4 MG PO TBDP: 4.0000 mg | ORAL_TABLET | Freq: Four times a day (QID) | ORAL | Status: DC | PRN

## 2018-08-13 MED ORDER — CEFAZOLIN SODIUM-DEXTROSE 2-4 GM/100ML-% IV SOLN
2.0000 g | INTRAVENOUS | Status: AC
  Administered 2018-08-13: 2 g via INTRAVENOUS

## 2018-08-13 MED ORDER — PROPOFOL 10 MG/ML IV BOLUS
INTRAVENOUS | Status: AC
  Filled 2018-08-13: qty 20

## 2018-08-13 MED ORDER — METHOCARBAMOL 500 MG PO TABS
500.0000 mg | ORAL_TABLET | Freq: Four times a day (QID) | ORAL | Status: DC | PRN
  Administered 2018-08-14: 500 mg via ORAL
  Filled 2018-08-13: qty 1

## 2018-08-13 MED ORDER — ACETAMINOPHEN 500 MG PO TABS
ORAL_TABLET | ORAL | Status: AC
  Administered 2018-08-13: 1000 mg via ORAL
  Filled 2018-08-13: qty 2

## 2018-08-13 MED ORDER — HYDROMORPHONE HCL 1 MG/ML IJ SOLN
0.2500 mg | INTRAMUSCULAR | Status: DC | PRN
  Administered 2018-08-13: 0.5 mg via INTRAVENOUS

## 2018-08-13 MED ORDER — CELECOXIB 200 MG PO CAPS
ORAL_CAPSULE | ORAL | Status: AC
  Administered 2018-08-13: 200 mg via ORAL
  Filled 2018-08-13: qty 1

## 2018-08-13 MED ORDER — TRAMADOL HCL 50 MG PO TABS
50.0000 mg | ORAL_TABLET | Freq: Four times a day (QID) | ORAL | Status: DC | PRN
  Administered 2018-08-14 (×2): 50 mg via ORAL
  Filled 2018-08-13 (×2): qty 1

## 2018-08-13 MED ORDER — ONDANSETRON HCL 4 MG/2ML IJ SOLN: 4.0000 mg | Freq: Four times a day (QID) | INTRAMUSCULAR | Status: DC | PRN

## 2018-08-13 MED ORDER — METOPROLOL TARTRATE 5 MG/5ML IV SOLN: 5.0000 mg | Freq: Four times a day (QID) | INTRAVENOUS | Status: DC | PRN

## 2018-08-13 MED ORDER — PHENYLEPHRINE 40 MCG/ML (10ML) SYRINGE FOR IV PUSH (FOR BLOOD PRESSURE SUPPORT)
PREFILLED_SYRINGE | INTRAVENOUS | Status: DC | PRN
  Administered 2018-08-13: 160 ug via INTRAVENOUS
  Administered 2018-08-13: 40 ug via INTRAVENOUS

## 2018-08-13 MED ORDER — ENOXAPARIN SODIUM 30 MG/0.3ML ~~LOC~~ SOLN
30.0000 mg | SUBCUTANEOUS | Status: DC
  Administered 2018-08-14: 30 mg via SUBCUTANEOUS
  Filled 2018-08-13: qty 0.3

## 2018-08-13 MED ORDER — SENNA 8.6 MG PO TABS
1.0000 | ORAL_TABLET | Freq: Two times a day (BID) | ORAL | Status: DC
  Administered 2018-08-13 – 2018-08-14 (×2): 8.6 mg via ORAL
  Filled 2018-08-13 (×2): qty 1

## 2018-08-13 MED ORDER — GABAPENTIN 300 MG PO CAPS
300.0000 mg | ORAL_CAPSULE | ORAL | Status: AC
  Administered 2018-08-13: 300 mg via ORAL

## 2018-08-13 MED ORDER — KETOROLAC TROMETHAMINE 30 MG/ML IJ SOLN: 30.0000 mg | Freq: Four times a day (QID) | INTRAMUSCULAR | Status: DC | PRN

## 2018-08-13 MED ORDER — HYDROCHLOROTHIAZIDE 25 MG PO TABS
25.0000 mg | ORAL_TABLET | Freq: Every day | ORAL | Status: DC
  Administered 2018-08-13 – 2018-08-14 (×2): 25 mg via ORAL
  Filled 2018-08-13 (×2): qty 1

## 2018-08-13 MED ORDER — 0.9 % SODIUM CHLORIDE (POUR BTL) OPTIME
TOPICAL | Status: DC | PRN
  Administered 2018-08-13: 1000 mL

## 2018-08-13 MED ORDER — ACETAMINOPHEN 500 MG PO TABS
1000.0000 mg | ORAL_TABLET | ORAL | Status: AC
  Administered 2018-08-13: 1000 mg via ORAL

## 2018-08-13 MED ORDER — GABAPENTIN 300 MG PO CAPS: 300.0000 mg | ORAL_CAPSULE | Freq: Two times a day (BID) | ORAL | Status: DC

## 2018-08-13 MED ORDER — CEFAZOLIN SODIUM-DEXTROSE 2-4 GM/100ML-% IV SOLN
INTRAVENOUS | Status: AC
  Filled 2018-08-13: qty 100

## 2018-08-13 MED ORDER — ACETAMINOPHEN 500 MG PO TABS
1000.0000 mg | ORAL_TABLET | Freq: Four times a day (QID) | ORAL | Status: DC
  Administered 2018-08-13 – 2018-08-14 (×3): 1000 mg via ORAL
  Filled 2018-08-13 (×3): qty 2

## 2018-08-13 MED ORDER — BUPIVACAINE-EPINEPHRINE 0.25% -1:200000 IJ SOLN
INTRAMUSCULAR | Status: DC | PRN
  Administered 2018-08-13: 30 mL

## 2018-08-13 MED ORDER — PROMETHAZINE HCL 25 MG/ML IJ SOLN: 6.2500 mg | INTRAMUSCULAR | Status: DC | PRN

## 2018-08-13 MED ORDER — ALBUTEROL SULFATE HFA 108 (90 BASE) MCG/ACT IN AERS
INHALATION_SPRAY | RESPIRATORY_TRACT | Status: AC
  Filled 2018-08-13: qty 6.7

## 2018-08-13 MED ORDER — ROCURONIUM BROMIDE 10 MG/ML (PF) SYRINGE
PREFILLED_SYRINGE | INTRAVENOUS | Status: DC | PRN
  Administered 2018-08-13 (×2): 10 mg via INTRAVENOUS
  Administered 2018-08-13: 50 mg via INTRAVENOUS

## 2018-08-13 MED ORDER — DOCUSATE SODIUM 100 MG PO CAPS: 100.0000 mg | ORAL_CAPSULE | Freq: Two times a day (BID) | ORAL | 0 refills | Status: AC

## 2018-08-13 MED ORDER — ALBUTEROL SULFATE (2.5 MG/3ML) 0.083% IN NEBU: 3.0000 mL | INHALATION_SOLUTION | Freq: Four times a day (QID) | RESPIRATORY_TRACT | Status: DC | PRN

## 2018-08-13 MED ORDER — CARVEDILOL 12.5 MG PO TABS
12.5000 mg | ORAL_TABLET | Freq: Two times a day (BID) | ORAL | Status: DC
  Administered 2018-08-13 – 2018-08-14 (×2): 12.5 mg via ORAL
  Filled 2018-08-13 (×2): qty 1

## 2018-08-13 MED ORDER — KETOROLAC TROMETHAMINE 30 MG/ML IJ SOLN
INTRAMUSCULAR | Status: AC
  Administered 2018-08-13: 30 mg via INTRAVENOUS
  Filled 2018-08-13: qty 1

## 2018-08-13 MED ORDER — CHLORHEXIDINE GLUCONATE 4 % EX LIQD: 60.0000 mL | Freq: Once | CUTANEOUS | Status: DC

## 2018-08-13 MED ORDER — DIPHENHYDRAMINE HCL 50 MG/ML IJ SOLN: 25.0000 mg | Freq: Four times a day (QID) | INTRAMUSCULAR | Status: DC | PRN

## 2018-08-13 MED ORDER — SIMETHICONE 80 MG PO CHEW: 40.0000 mg | CHEWABLE_TABLET | Freq: Four times a day (QID) | ORAL | Status: DC | PRN

## 2018-08-13 MED ORDER — LACTATED RINGERS IV SOLN
INTRAVENOUS | Status: DC | PRN
  Administered 2018-08-13 (×2): via INTRAVENOUS

## 2018-08-13 MED ORDER — KETOROLAC TROMETHAMINE 30 MG/ML IJ SOLN
30.0000 mg | Freq: Once | INTRAMUSCULAR | Status: AC | PRN
  Administered 2018-08-13: 30 mg via INTRAVENOUS

## 2018-08-13 MED ORDER — GABAPENTIN 300 MG PO CAPS
300.0000 mg | ORAL_CAPSULE | Freq: Three times a day (TID) | ORAL | Status: DC
  Administered 2018-08-13 – 2018-08-14 (×4): 300 mg via ORAL
  Filled 2018-08-13 (×4): qty 1

## 2018-08-13 MED ORDER — CLONIDINE HCL 0.2 MG PO TABS
0.2000 mg | ORAL_TABLET | Freq: Once | ORAL | Status: DC
  Filled 2018-08-13: qty 1

## 2018-08-13 MED ORDER — DEXAMETHASONE SODIUM PHOSPHATE 10 MG/ML IJ SOLN
INTRAMUSCULAR | Status: DC | PRN
  Administered 2018-08-13: 10 mg via INTRAVENOUS

## 2018-08-13 MED ORDER — HYDROCODONE-ACETAMINOPHEN 7.5-325 MG PO TABS: 1.0000 | ORAL_TABLET | Freq: Once | ORAL | Status: DC | PRN

## 2018-08-13 MED ORDER — GABAPENTIN 300 MG PO CAPS
ORAL_CAPSULE | ORAL | Status: AC
  Administered 2018-08-13: 300 mg via ORAL
  Filled 2018-08-13: qty 1

## 2018-08-13 MED ORDER — MIDAZOLAM HCL 5 MG/5ML IJ SOLN
INTRAMUSCULAR | Status: DC | PRN
  Administered 2018-08-13: 2 mg via INTRAVENOUS

## 2018-08-13 MED ORDER — LIDOCAINE 2% (20 MG/ML) 5 ML SYRINGE
INTRAMUSCULAR | Status: DC | PRN
  Administered 2018-08-13: 80 mg via INTRAVENOUS

## 2018-08-13 MED ORDER — DIPHENHYDRAMINE HCL 25 MG PO CAPS: 25.0000 mg | ORAL_CAPSULE | Freq: Four times a day (QID) | ORAL | Status: DC | PRN

## 2018-08-13 MED ORDER — DOCUSATE SODIUM 100 MG PO CAPS
100.0000 mg | ORAL_CAPSULE | Freq: Two times a day (BID) | ORAL | Status: DC
  Administered 2018-08-13 – 2018-08-14 (×2): 100 mg via ORAL
  Filled 2018-08-13 (×2): qty 1

## 2018-08-13 MED ORDER — OXYCODONE HCL 5 MG PO TABS: 5.0000 mg | ORAL_TABLET | Freq: Four times a day (QID) | ORAL | 0 refills | Status: DC | PRN

## 2018-08-13 MED ORDER — HYDRALAZINE HCL 20 MG/ML IJ SOLN: 10.0000 mg | INTRAMUSCULAR | Status: DC | PRN

## 2018-08-13 SURGICAL SUPPLY — 47 items
ADH SKN CLS APL DERMABOND .7 (GAUZE/BANDAGES/DRESSINGS) ×1
APPLIER CLIP 5 13 M/L LIGAMAX5 (MISCELLANEOUS)
APR CLP MED LRG 5 ANG JAW (MISCELLANEOUS)
BAG SPEC RTRVL LRG 6X4 10 (ENDOMECHANICALS) ×1
BINDER ABD UNIV 12 45-62 (WOUND CARE) ×1 IMPLANT
BINDER ABDOMINAL 46IN 62IN (WOUND CARE) ×3
BLADE CLIPPER SURG (BLADE) IMPLANT
CANISTER SUCT 3000ML PPV (MISCELLANEOUS) IMPLANT
CHLORAPREP W/TINT 26ML (MISCELLANEOUS) ×3 IMPLANT
CLIP APPLIE 5 13 M/L LIGAMAX5 (MISCELLANEOUS) IMPLANT
COVER SURGICAL LIGHT HANDLE (MISCELLANEOUS) ×3 IMPLANT
DERMABOND ADVANCED (GAUZE/BANDAGES/DRESSINGS) ×2
DERMABOND ADVANCED .7 DNX12 (GAUZE/BANDAGES/DRESSINGS) ×1 IMPLANT
DEVICE SECURE STRAP 25 ABSORB (INSTRUMENTS) ×5 IMPLANT
DEVICE TROCAR PUNCTURE CLOSURE (ENDOMECHANICALS) ×2 IMPLANT
ELECT REM PT RETURN 9FT ADLT (ELECTROSURGICAL) ×3
ELECTRODE REM PT RTRN 9FT ADLT (ELECTROSURGICAL) ×1 IMPLANT
GLOVE BIO SURGEON STRL SZ 6 (GLOVE) ×3 IMPLANT
GLOVE INDICATOR 6.5 STRL GRN (GLOVE) ×3 IMPLANT
GOWN STRL REUS W/ TWL LRG LVL3 (GOWN DISPOSABLE) ×3 IMPLANT
GOWN STRL REUS W/TWL LRG LVL3 (GOWN DISPOSABLE) ×9
KIT BASIN OR (CUSTOM PROCEDURE TRAY) ×3 IMPLANT
KIT TURNOVER KIT B (KITS) ×3 IMPLANT
MARKER SKIN DUAL TIP RULER LAB (MISCELLANEOUS) ×3 IMPLANT
MESH VENTRALIGHT ST 6IN CRC (Mesh General) ×2 IMPLANT
NDL SPNL 22GX3.5 QUINCKE BK (NEEDLE) ×1 IMPLANT
NEEDLE SPNL 22GX3.5 QUINCKE BK (NEEDLE) ×3 IMPLANT
NS IRRIG 1000ML POUR BTL (IV SOLUTION) ×3 IMPLANT
PAD ARMBOARD 7.5X6 YLW CONV (MISCELLANEOUS) ×6 IMPLANT
POUCH SPECIMEN RETRIEVAL 10MM (ENDOMECHANICALS) ×2 IMPLANT
SCISSORS LAP 5X35 DISP (ENDOMECHANICALS) ×3 IMPLANT
SET IRRIG TUBING LAPAROSCOPIC (IRRIGATION / IRRIGATOR) IMPLANT
SHEARS HARMONIC ACE PLUS 36CM (ENDOMECHANICALS) ×2 IMPLANT
SLEEVE ENDOPATH XCEL 5M (ENDOMECHANICALS) ×6 IMPLANT
SUT ETHIBOND CT1 BRD #0 30IN (SUTURE) ×8 IMPLANT
SUT MNCRL AB 4-0 PS2 18 (SUTURE) ×3 IMPLANT
SUT NOVA NAB GS-21 0 18 T12 DT (SUTURE) IMPLANT
SUT PDS AB 2-0 CT1 27 (SUTURE) IMPLANT
TOWEL OR 17X24 6PK STRL BLUE (TOWEL DISPOSABLE) ×3 IMPLANT
TOWEL OR 17X26 10 PK STRL BLUE (TOWEL DISPOSABLE) ×1 IMPLANT
TRAY FOLEY W/BAG SLVR 14FR (SET/KITS/TRAYS/PACK) ×2 IMPLANT
TRAY LAPAROSCOPIC MC (CUSTOM PROCEDURE TRAY) ×3 IMPLANT
TROCAR XCEL BLUNT TIP 100MML (ENDOMECHANICALS) IMPLANT
TROCAR XCEL NON-BLD 11X100MML (ENDOMECHANICALS) ×3 IMPLANT
TROCAR XCEL NON-BLD 5MMX100MML (ENDOMECHANICALS) ×3 IMPLANT
TUBING INSUFFLATION (TUBING) ×3 IMPLANT
WATER STERILE IRR 1000ML POUR (IV SOLUTION) ×3 IMPLANT

## 2018-08-13 NOTE — Progress Notes (Signed)
Called by staff to patient room and staff claiming patient passed out, upon entry to patient room , patient was seating at the edge of the bed , tech beside her. Patient is alert and oriented, VSS. Md made aware of the episode. Will continue to monitor.

## 2018-08-13 NOTE — Transfer of Care (Signed)
Immediate Anesthesia Transfer of Care Note  Patient: Shirley Terrell  Procedure(s) Performed: LAPAROSCOPIC INCISIONAL HERNIA REPAIR (N/A Abdomen) INSERTION OF MESH (N/A Abdomen)  Patient Location: PACU  Anesthesia Type:General  Level of Consciousness: awake and patient cooperative  Airway & Oxygen Therapy: Patient Spontanous Breathing and Patient connected to face mask oxygen  Post-op Assessment: Report given to RN and Post -op Vital signs reviewed and stable  Post vital signs: Reviewed and stable  Last Vitals:  Vitals Value Taken Time  BP 93/67 08/13/2018  2:55 PM  Temp    Pulse 71 08/13/2018  2:56 PM  Resp 19 08/13/2018  2:56 PM  SpO2 99 % 08/13/2018  2:56 PM  Vitals shown include unvalidated device data.  Last Pain:  Vitals:   08/13/18 1109  TempSrc:   PainSc: 6          Complications: No apparent anesthesia complications

## 2018-08-13 NOTE — Anesthesia Procedure Notes (Signed)
Procedure Name: Intubation Date/Time: 08/13/2018 12:57 PM Performed by: Rosiland Oz, CRNA Pre-anesthesia Checklist: Patient identified, Emergency Drugs available, Suction available, Patient being monitored and Timeout performed Patient Re-evaluated:Patient Re-evaluated prior to induction Oxygen Delivery Method: Circle system utilized Preoxygenation: Pre-oxygenation with 100% oxygen Induction Type: IV induction Ventilation: Mask ventilation without difficulty Laryngoscope Size: Miller and 3 Grade View: Grade I Tube type: Oral Tube size: 7.0 mm Number of attempts: 1 Airway Equipment and Method: Stylet Placement Confirmation: ETT inserted through vocal cords under direct vision,  positive ETCO2 and breath sounds checked- equal and bilateral Secured at: 21 cm Tube secured with: Tape Dental Injury: Teeth and Oropharynx as per pre-operative assessment

## 2018-08-13 NOTE — Anesthesia Preprocedure Evaluation (Signed)
Anesthesia Evaluation  Patient identified by MRN, date of birth, ID band Patient awake    Reviewed: Allergy & Precautions, NPO status , Patient's Chart, lab work & pertinent test results  Airway Mallampati: II  TM Distance: >3 FB Neck ROM: Full    Dental no notable dental hx. (+) Teeth Intact, Dental Advisory Given   Pulmonary sleep apnea , Current Smoker,    Pulmonary exam normal breath sounds clear to auscultation       Cardiovascular hypertension, Pt. on medications negative cardio ROS Normal cardiovascular exam Rhythm:Regular Rate:Normal     Neuro/Psych negative neurological ROS     GI/Hepatic   Endo/Other  Morbid obesity  Renal/GU      Musculoskeletal   Abdominal (+) + obese,   Peds  Hematology  (+) anemia ,   Anesthesia Other Findings   Reproductive/Obstetrics                            Anesthesia Physical Anesthesia Plan  ASA: III  Anesthesia Plan: General   Post-op Pain Management:    Induction: Intravenous  PONV Risk Score and Plan: 2 and Treatment may vary due to age or medical condition, Ondansetron, Dexamethasone and Scopolamine patch - Pre-op  Airway Management Planned: Oral ETT  Additional Equipment:   Intra-op Plan:   Post-operative Plan: Extubation in OR  Informed Consent: I have reviewed the patients History and Physical, chart, labs and discussed the procedure including the risks, benefits and alternatives for the proposed anesthesia with the patient or authorized representative who has indicated his/her understanding and acceptance.   Dental advisory given  Plan Discussed with:   Anesthesia Plan Comments:         Anesthesia Quick Evaluation

## 2018-08-13 NOTE — Interval H&P Note (Signed)
History and Physical Interval Note:  08/13/2018 11:37 AM  Shirley Terrell  has presented today for surgery, with the diagnosis of incisional hernia  The various methods of treatment have been discussed with the patient and family. After consideration of risks, benefits and other options for treatment, the patient has consented to  Procedure(s): LAPAROSCOPIC INCISIONAL HERNIA REPAIR WITH MESH (N/A) INSERTION OF MESH (N/A) as a surgical intervention .  The patient's history has been reviewed, patient examined, no change in status, stable for surgery.  I have reviewed the patient's chart and labs.  Questions were answered to the patient's satisfaction.     Cicilia Clinger Lollie Sails

## 2018-08-13 NOTE — Discharge Instructions (Signed)
[facility] REPAIR: POST OP INSTRUCTIONS  ######################################################################  EAT Gradually transition to a high fiber diet with a fiber supplement over the next few weeks after discharge.  Start with a pureed / full liquid diet (see below)  WALK Walk an hour a day.  Control your pain to do that.    CONTROL PAIN Control pain so that you can walk, sleep, tolerate sneezing/coughing, and go up/down stairs.  HAVE A BOWEL MOVEMENT DAILY Keep your bowels regular to avoid problems.  OK to try a laxative to override constipation.  OK to use an antidairrheal to slow down diarrhea.  Call if not better after 2 tries  CALL IF YOU HAVE PROBLEMS/CONCERNS Call if you are still struggling despite following these instructions. Call if you have concerns not answered by these instructions  ######################################################################    1. DIET: Follow a light bland diet the first 24 hours after arrival home, such as soup, liquids, crackers, etc.  Be sure to include lots of fluids daily.  Advance to a low fat / high fiber diet over the next few days after surgery.  Avoid fast food or heavy meals the first week as your are more likely to get nauseated.    2. Take your usually prescribed home medications unless otherwise directed.  3. PAIN CONTROL: a. Pain is best controlled by a usual combination of three different methods TOGETHER: i. Ice/Heat ii. Over the counter pain medication iii. Prescription pain medication b. Most patients will experience some swelling and bruising around the hernia(s) such as the bellybutton, groins, or old incisions.  Ice packs or heating pads (30-60 minutes up to 6 times a day), along with wearing the abdominal binder, will help. Use ice for the first few days to help decrease swelling and bruising, then switch to heat to help relax tight/sore spots and speed recovery.  Some people prefer to use ice alone, heat alone,  alternating between ice & heat.  Experiment to what works for you.  Swelling and bruising can take several weeks to resolve.   c. It is helpful to take an over-the-counter pain medication regularly for the first few weeks.  Choose one of the following that works best for you: i. Naproxen (Aleve, etc)  Two 220mg  tabs twice a day ii. Ibuprofen (Advil, etc) Three 200mg  tabs four times a day (every meal & bedtime) iii. Acetaminophen (Tylenol, etc) 325-650mg  four times a day (every meal & bedtime) d. A  prescription for pain medication should be given to you upon discharge.  Take your pain medication as prescribed.  i. If you are having problems/concerns with the prescription medicine (does not control pain, nausea, vomiting, rash, itching, etc), please call us [phone] to see if we need to switch you to a different pain medicine that will work better for you and/or control your side effect better. ii. If you need a refill on your pain medication, please contact your pharmacy.  They will contact our office to request authorization. Prescriptions will not be filled after 5 pm or on week-ends.  4. Avoid getting constipated.  Between the surgery and the pain medications, it is common to experience some constipation.  Increasing fluid intake and taking a fiber supplement (such as Metamucil, Citrucel, FiberCon, MiraLax, etc) 1-2 times a day regularly will usually help prevent this problem from occurring.  A mild laxative (prune juice, Milk of Magnesia, MiraLax, etc) should be taken according to package directions if there are no bowel movements after 48 hours.  5. Wash / shower every day.  You may shower over the skin glue which is waterproof.    6. Skin glue will flake off after 2 weeks. You may replace a dressing/Band-Aid to cover the incision for comfort if you wish. You may leave the incisions open to air.  You may replace a dressing/Band-Aid to cover an incision for comfort if you wish.  Continue to  shower over incision(s) after the dressing is off.  7. ACTIVITIES as tolerated:    a. You may resume regular (light) daily activities beginning the next day--such as daily self-care, walking, climbing stairs--gradually increasing activities as tolerated.  Control your pain so that you can walk an hour a day.  If you can walk 30 minutes without difficulty, it is safe to try more intense activity such as jogging, treadmill, bicycling, low-impact aerobics, swimming, etc. b. Refrain from the most intensive and strenuous activity such as sit-ups, heavy lifting, contact sports, etc  Refrain from any heavy lifting or straining until you 6 weeks after surgery.   c. DO NOT PUSH THROUGH PAIN.  Let pain be your guide: If it hurts to do something, don't do it.  Pain is your body warning you to avoid that activity for another week until the pain goes down. d. You may drive when you are no longer taking prescription pain medication, you can comfortably wear a seatbelt, and you can safely maneuver your car and apply brakes. e. Bonita Quin may have sexual intercourse when it is comfortable.  f. Wear the abdominal binder at all times for the first week after surgery. After this, wear it when you are up and moving around.   8. FOLLOW UP in our office a. Please call [facility] at [phone] to set up an appointment to see your surgeon in the office for a follow-up appointment approximately 2-3 weeks after your surgery. b. Make sure that you call for this appointment the day you arrive home to insure a convenient appointment time.  9.  If you have disability of FMLA / Family leave forms, please bring the forms to the office for processing.  (do not give to your surgeon).  WHEN TO CALL us [phone]: 1. Poor pain control 2. Reactions / problems with new medications (rash/itching, nausea, etc)  3. Fever over 101.5 F (38.5 C) 4. Inability to urinate 5. Nausea and/or vomiting 6. Worsening swelling or bruising 7. Continued  bleeding from incision. 8. Increased pain, redness, or drainage from the incision   The clinic staff is available to answer your questions during regular business hours (8:30am-5pm).  Please dont hesitate to call and ask to speak to one of our nurses for clinical concerns.   If you have a medical emergency, go to the nearest emergency room or call 911.  A surgeon from [facility] is always on call at the hospitals in [facility], [place] [address], [address], [place], [place]  [address] ?  [address] [address], [place], [place]   [address] MAIN: [phone] ? TOLL FREE: [phone] ? FAX: [phone] [email or web address]

## 2018-08-13 NOTE — Op Note (Signed)
Operative Note  Shirley Terrell  [id number]  [id number]  08/13/2018   Surgeon: Lady Deutscher ConnorMD  Assistant: Magnus Ivan RNFA  Procedure performed: laparoscopic repair of incarcerated epigastric incisional hernia, excision of peritoneal nodule  Preop diagnosis: chronically incarcerated epigastric incisional hernia Post-op diagnosis/intraop findings: same  Specimens: peritoneal nodule Retained items: no EBL: minimal cc Complications: none  Description of procedure: After obtaining informed consent the patient was taken to the operating room and placed supine on operating room table wheregeneral endotracheal anesthesia was initiated, preoperative antibiotics were administered, SCDs applied, and a formal timeout was performed. Patient's abdomen was prepped and draped in usual sterile fashion. Peritoneal access was gained using a visiport technique in the left upper quadrant. Insufflation to 15 mmHg ensued without incident. Gross inspection revealed no evidence of injury from our entry or overt intra-abdominal abnormalities. There is a hernia several centimeters superior to the umbilicus and a bit superior to the prior trocar site with chronically incarcerated omentum adherent to a fibrosed area of peritoneum. Under direct visualization bilateral 5 mm trochars in the left lower quadrant 11 mm trocar were placed after infiltration with local. The Harmonic scalpel was then used to divide the omentum from the fibrous peritoneal nodule which was essentially the hernia sac. The sac was excised including this firm area peritoneum, this was removed and sent for pathological examination. The falciform ligament was taken down off the abdominal wall using the Harmonic to provide landing zone for the mesh. The hernia defect was measured 3 cm in diameter. She was also noted to have a subcentimeter umbilical hernia which was palpable. In order to afford 5 cm overlap around both of these defects, I  chose a 6" x 8" ventralex mesh.  A stab incision was made over the epigastric hernia and the PMI device was used to close the fascial defect vertically with interrupted 0 Ethibonds. The mesh was then brought onto the field and 0 Ethibond sutures fixed at the 4 cardinal directions with the knots on the rough side of the mesh. The rough side of the mesh was additionally marked for orientation. It was then moistened and inserted into the abdomen through the 11 mm trocar, unfurled and appropriately oriented. the PMI was then used to bring the stay sutures through the abdominal wall at the 4 cardinal directions, these were tied down and the mesh was flush against the abdominal wall appropriately overlapping hernia defects as well as the peritoneal entry point of the 11 mm trocar site. The secure strap Vicryl tacker was then used to secure the mesh circumferentially to the abdominal wall. Additional inner crown of tacks were placed. The abdomen was then once again surveyed and confirmed to be hemostatic and free of injury. The omentum was brought back down over the small bowel. The mesh was well apposed to the abdominal wall, nice and flush with no exposed rough surface. The abdomen was desufflated and trochars removed. All the skin incisions were closed with subcuticular Monocryl and Dermabond. The patient was then awakened, extubated and taken to PACU in stable condition.   All counts were correct at the completion of the case.

## 2018-08-14 DIAGNOSIS — K430 Incisional hernia with obstruction, without gangrene: Secondary | ICD-10-CM | POA: Diagnosis not present

## 2018-08-14 MED ORDER — ENOXAPARIN SODIUM 40 MG/0.4ML ~~LOC~~ SOLN: 40.0000 mg | SUBCUTANEOUS | Status: DC

## 2018-08-14 NOTE — Discharge Summary (Signed)
Physician Discharge Summary    Patient ID: Shirley Terrell MRN: [id number] DOB/AGE: 1986/07/17  32 y.o.  Admit date: 08/13/2018 Discharge date: 08/14/2018   Hospital Stay = 0 days  Patient Care Team: Gildardo Pounds, NP as PCP - General (Nurse Practitioner) Jerline Pain, MD as PCP - Cardiology (Cardiology)  Discharge Diagnoses:  Active Problems:   Epigastric hernia   1 Day Post-Op  08/13/2018  POST-OPERATIVE DIAGNOSIS:   INCISIONAL HERNIA  SURGERY:  08/13/2018  Procedure(s): LAPAROSCOPIC INCISIONAL HERNIA REPAIR INSERTION OF MESH  SURGEON:    Surgeon(s): Clovis Riley, MD  Consults: None  Hospital Course:   The patient underwent the surgery above.  Postoperatively, the patient gradually mobilized and advanced to a solid diet.  Pain and other symptoms were treated aggressively.    By the time of discharge, the patient was walking well the hallways, eating food, having flatus.  Pain was well-controlled on an oral medications.  Based on meeting discharge criteria and continuing to recover, I felt it was safe for the patient to be discharged from the hospital to further recover with close followup. Postoperative recommendations were discussed in detail.  They are written as well.  Discharged Condition: good  Disposition:  Follow-up Information    Clovis Riley, MD In 2 weeks.   Specialty:  General Surgery Contact information: [address] [place] [address] [phone]           Discharge disposition: 01-Home or Self Care       Discharge Instructions    Call MD for:   Complete by:  As directed    FEVER > 101.5 F  (temperatures < 101.5 F are not significant)   Call MD for:  extreme fatigue   Complete by:  As directed    Call MD for:  persistant dizziness or light-headedness   Complete by:  As directed    Call MD for:  persistant nausea and vomiting   Complete by:  As directed    Call MD for:  redness,  tenderness, or signs of infection (pain, swelling, redness, odor or green/yellow discharge around incision site)   Complete by:  As directed    Call MD for:  severe uncontrolled pain   Complete by:  As directed    Diet - low sodium heart healthy   Complete by:  As directed    Start with a bland diet such as soups, liquids, starchy foods, low fat foods, etc. the first few days at home. Gradually advance to a solid, low-fat, high fiber diet by the end of the first week at home.   Add a fiber supplement to your diet (Metamucil, etc) If you feel full, bloated, or constipated, stay on a full liquid or pureed/blenderized diet for a few days until you feel better and are no longer constipated.   Discharge instructions   Complete by:  As directed    See Discharge Instructions If you are not getting better after two weeks or are noticing you are getting worse, contact our office [phone] for further advice.  We may need to adjust your medications, re-evaluate you in the office, send you to the emergency room, or see what other things we can do to help. The clinic staff is available to answer your questions during regular business hours (8:30am-5pm).  Please don't hesitate to call and ask to speak to one of our nurses for clinical concerns.    A Psychologist, sport and exercise from [facility]  Surgery is always on call at the hospitals 24 hours/day If you have a medical emergency, go to the nearest emergency room or call 911.   Discharge wound care:   Complete by:  As directed    It is good for closed incisions and even open wounds to be washed every day.  Shower every day.  Short baths are fine.  Wash the incisions and wounds clean with soap & water.     You may leave closed incisions open to air if it is dry.   You may cover the incision with clean gauze & replace it after your daily shower for comfort.   You have skin glue (Dermabond) on your incisions, so leave them in place.  They will fall off on their own  like a scab.  You may trim any edges that curl up with clean scissors.   Driving Restrictions   Complete by:  As directed    You may drive when: - you are no longer taking narcotic prescription pain medication - you can comfortably wear a seatbelt - you can safely make sudden turns/stops without pain.   Increase activity slowly   Complete by:  As directed    Start light daily activities --- self-care, walking, climbing stairs- beginning the day after surgery.  Gradually increase activities as tolerated.  Control your pain to be active.  Stop when you are tired.  Ideally, walk several times a day, eventually an hour a day.   Most people are back to most day-to-day activities in a few weeks.  It takes 4-6 weeks to get back to unrestricted, intense activity. If you can walk 30 minutes without difficulty, it is safe to try more intense activity such as jogging, treadmill, bicycling, low-impact aerobics, swimming, etc. Save the most intensive and strenuous activity for last (Usually 4-8 weeks after surgery) such as sit-ups, heavy lifting, contact sports, etc.  Refrain from any intense heavy lifting or straining until you are off narcotics for pain control.  You will have off days, but things should improve week-by-week. DO NOT PUSH THROUGH PAIN.  Let pain be your guide: If it hurts to do something, don't do it.   Lifting restrictions   Complete by:  As directed    If you can walk 30 minutes without difficulty, it is safe to try more intense activity such as jogging, treadmill, bicycling, low-impact aerobics, swimming, etc. Save the most intensive and strenuous activity for last (Usually 4-8 weeks after surgery) such as sit-ups, heavy lifting, contact sports, etc.   Refrain from any intense heavy lifting or straining until you are off narcotics for pain control.  You will have off days, but things should improve week-by-week. DO NOT PUSH THROUGH PAIN.  Let pain be your guide: If it hurts to do  something, don't do it.  Pain is your body warning you to avoid that activity for another week until the pain goes down.   May shower / Bathe   Complete by:  As directed    May walk up steps   Complete by:  As directed    Sexual Activity Restrictions   Complete by:  As directed    You may have sexual intercourse when it is comfortable. If it hurts to do something, stop.      Allergies as of 08/14/2018   No Known Allergies     Medication List    TAKE these medications   acetaminophen 500 MG tablet Commonly known as:  TYLENOL  Take 1,000 mg by mouth every 6 (six) hours as needed for moderate pain or headache.   albuterol 108 (90 Base) MCG/ACT inhaler Commonly known as:  PROVENTIL HFA;VENTOLIN HFA Inhale 2 puffs into the lungs every 6 (six) hours as needed for wheezing. Reported on 04/11/2016   amLODipine 5 MG tablet Commonly known as:  NORVASC Take 1 tablet (5 mg total) by mouth daily.   carvedilol 12.5 MG tablet Commonly known as:  COREG Take 1 tablet (12.5 mg total) by mouth 2 (two) times daily with a meal.   docusate sodium 100 MG capsule Commonly known as:  COLACE Take 1 capsule (100 mg total) by mouth 2 (two) times daily.   ferrous sulfate 325 (65 FE) MG tablet Take 325 mg by mouth daily.   hydrochlorothiazide 25 MG tablet Commonly known as:  HYDRODIURIL Take 1 tablet (25 mg total) by mouth daily.   Misc. Devices Misc Please provide patient with an insurance approved CPAP.  Settings should be as follows: DME autopap 5-15.  Medium Cushion size [employer] Resp[employer]ics Minimal Architectural technologist Under Nose Frame (M) mask and heated humidification.   nicotine 14 mg/24hr patch Commonly known as:  NICODERM CQ - dosed in mg/24 hours Place 1 patch (14 mg total) onto the skin daily.   oxyCODONE 5 MG immediate release tablet Commonly known as:  Oxy IR/ROXICODONE Take 1 tablet (5 mg total) by mouth every 6 (six) hours as needed for severe pain or breakthrough pain.     PAZEO 0.7 % Soln Generic drug:  Olopatadine HCl Place 1 drop into both eyes daily as needed for allergies.   RESTASIS 0.05 % ophthalmic emulsion Generic drug:  cycloSPORINE Place 1 drop into both eyes 2 (two) times daily as needed for dry eyes.            Discharge Care Instructions  (From admission, onward)         Start     Ordered   08/14/18 0000  Discharge wound care:    Comments:  It is good for closed incisions and even open wounds to be washed every day.  Shower every day.  Short baths are fine.  Wash the incisions and wounds clean with soap & water.     You may leave closed incisions open to air if it is dry.   You may cover the incision with clean gauze & replace it after your daily shower for comfort.   You have skin glue (Dermabond) on your incisions, so leave them in place.  They will fall off on their own like a scab.  You may trim any edges that curl up with clean scissors.   08/14/18 0823          Significant Diagnostic Studies:  Results for orders placed or performed during the hospital encounter of 08/13/18 (from the past 72 hour(s))  Basic metabolic panel     Status: Abnormal   Collection Time: 08/13/18 10:48 AM  Result Value Ref Range   Sodium 136 135 - 145 mmol/L   Potassium 3.7 3.5 - 5.1 mmol/L   Chloride 104 98 - 111 mmol/L   CO2 22 22 - 32 mmol/L   Glucose, Bld 107 (H) 70 - 99 mg/dL   BUN 7 6 - 20 mg/dL   Creatinine, Ser 0.68 0.44 - 1.00 mg/dL   Calcium 9.2 8.9 - 10.3 mg/dL   GFR calc non Af Amer >60 >60 mL/min   GFR calc Af Amer >60 >60 mL/min  Comment: (NOTE) The eGFR has been calculated using the CKD EPI equation. This calculation has not been validated in all clinical situations. eGFR's persistently <60 mL/min signify possible Chronic Kidney Disease.    Anion gap 10 5 - 15    Comment: Performed at [facility] [address], [place], [place] [address]  CBC WITH DIFFERENTIAL     Status: Abnormal   Collection Time: 08/13/18  10:48 AM  Result Value Ref Range   WBC 10.7 (H) 4.0 - 10.5 K/uL   RBC 4.18 3.87 - 5.11 MIL/uL   Hemoglobin 12.8 12.0 - 15.0 g/dL   HCT 40.0 36.0 - 46.0 %   MCV 95.7 78.0 - 100.0 fL   MCH 30.6 26.0 - 34.0 pg   MCHC 32.0 30.0 - 36.0 g/dL   RDW 11.8 11.5 - 15.5 %   Platelets 278 150 - 400 K/uL   Neutrophils Relative % 72 %   Neutro Abs 7.8 (H) 1.7 - 7.7 K/uL   Lymphocytes Relative 19 %   Lymphs Abs 2.0 0.7 - 4.0 K/uL   Monocytes Relative 6 %   Monocytes Absolute 0.6 0.1 - 1.0 K/uL   Eosinophils Relative 2 %   Eosinophils Absolute 0.2 0.0 - 0.7 K/uL   Basophils Relative 1 %   Basophils Absolute 0.1 0.0 - 0.1 K/uL   Immature Granulocytes 0 %   Abs Immature Granulocytes 0.0 0.0 - 0.1 K/uL    Comment: Performed at [facility] [address] [place], [place] [address]  Pregnancy, urine POC     Status: None   Collection Time: 08/13/18 12:12 PM  Result Value Ref Range   Preg Test, Ur NEGATIVE NEGATIVE    Comment:        THE SENSITIVITY OF THIS METHODOLOGY IS >24 mIU/mL     No results found.  Discharge Exam: Blood pressure 116/77, pulse 91, temperature 98.5 F (36.9 C), temperature source Oral, resp. rate 16, height 5' 3"  (1.6 m), weight 102.5 kg, last menstrual period 07/25/2018, SpO2 100 %, currently breastfeeding.  General: Pt awake/alert/oriented x4 in No acute distress.  Walking around room w/o difficulty. Eyes: PERRL, normal EOM.  Sclera clear.  No icterus Neuro: CN II-XII intact w/o focal sensory/motor deficits. Lymph: No head/neck/groin lymphadenopathy Psych:  No delerium/psychosis/paranoia HENT: Normocephalic, Mucus membranes moist.  No thrush Neck: Supple, No tracheal deviation Chest: No chest wall pain w good excursion CV:  Pulses intact.  Regular rhythm MS: Normal AROM mjr joints.  No obvious deformity Abdomen:  Obese but  Soft.  Nondistended.  Mildly tender at incisions only.  No evidence of peritonitis.  No incarcerated hernias. Ext:  SCDs BLE.  No mjr  edema.  No cyanosis Skin: No petechiae / purpura  Past Medical History:  Diagnosis Date  . Anemia    takes iron supplement  . Asthma    prn inhaler  . Hypertension    states BP normalized while pregnant and since delivery; is currently not on med.  . Lisfranc dislocation 11/30/2016   left  . Sleep apnea    uses cpap    Past Surgical History:  Procedure Laterality Date  . CHOLECYSTECTOMY  04/13/2012   Procedure: LAPAROSCOPIC CHOLECYSTECTOMY WITH INTRAOPERATIVE CHOLANGIOGRAM;  Surgeon: Odis Hollingshead, MD;  Location: [facility];  Service: General;  Laterality: N/A;  laparoscopic cholecystectomy with IOC  . INCISIONAL HERNIA REPAIR  08/13/2018   LAPROSCOPIC  . OPEN REDUCTION INTERNAL FIXATION (ORIF) FOOT LISFRANC FRACTURE Left 12/18/2016  Procedure: OPEN REDUCTION INTERNAL FIXATION (ORIF) FOOT LISFRANC FRACTURE;  Surgeon: Wylene Simmer, MD;  Location: [facility];  Service: Orthopedics;  Laterality: Left;    Social History   Socioeconomic History  . Marital status: Single    Spouse name: Not on file  . Number of children: Not on file  . Years of education: Not on file  . Highest education level: Not on file  Occupational History  . Not on file  Social Needs  . Financial resource strain: Not on file  . Food insecurity:    Worry: Not on file    Inability: Not on file  . Transportation needs:    Medical: Not on file    Non-medical: Not on file  Tobacco Use  . Smoking status: Current Some Day Smoker    Packs/day: 0.00    Years: 3.00    Pack years: 0.00    Types: Cigarettes    Last attempt to quit: 12/14/2017    Years since quitting: 0.6  . Smokeless tobacco: Never Used  . Tobacco comment: 6 cigarettes/day  Substance and Sexual Activity  . Alcohol use: Yes    Alcohol/week: 0.0 standard drinks    Comment: occasionally  . Drug use: No  . Sexual activity: Yes    Partners: Male    Birth control/protection: Implant  Lifestyle  . Physical activity:    Days per  week: Not on file    Minutes per session: Not on file  . Stress: Not on file  Relationships  . Social connections:    Talks on phone: Not on file    Gets together: Not on file    Attends religious service: Not on file    Active member of club or organization: Not on file    Attends meetings of clubs or organizations: Not on file    Relationship status: Not on file  . Intimate partner violence:    Fear of current or ex partner: Not on file    Emotionally abused: Not on file    Physically abused: Not on file    Forced sexual activity: Not on file  Other Topics Concern  . Not on file  Social History Narrative   She is single with 2 daughters, age 74 yrs and 1 yr in 2019.     Family History  Problem Relation Age of Onset  . Hypertension Mother   . Stroke Mother   . Renal Disease Mother        due to blood pressure  . Heart disease Father        MI, no stents  . Depression Father   . Hypertension Father   . Stroke Father   . Cancer Paternal Grandmother        breast  . Diabetes Paternal Grandmother   . Cancer Paternal Grandfather   . Hypertension Maternal Grandmother   . Diabetes Other   . Healthy Sister   . Healthy Sister   . Healthy Sister   . Healthy Sister     Current Facility-Administered Medications  Medication Dose Route Frequency Provider Last Rate Last Dose  . acetaminophen (TYLENOL) tablet 1,000 mg  1,000 mg Oral Q6H Romana Juniper A, MD   1,000 mg at 08/14/18 0533  . albuterol (PROVENTIL) (2.5 MG/3ML) 0.083% nebulizer solution 3 mL  3 mL Inhalation Q6H PRN Romana Juniper A, MD      . amLODipine (NORVASC) tablet 5 mg  5 mg Oral Daily Clovis Riley, MD      .  bisacodyl (DULCOLAX) suppository 10 mg  10 mg Rectal Daily PRN Romana Juniper A, MD      . carvedilol (COREG) tablet 12.5 mg  12.5 mg Oral BID WC Romana Juniper A, MD   12.5 mg at 08/14/18 0749  . diphenhydrAMINE (BENADRYL) capsule 25 mg  25 mg Oral Q6H PRN Clovis Riley, MD       Or  .  diphenhydrAMINE (BENADRYL) injection 25 mg  25 mg Intravenous Q6H PRN Romana Juniper A, MD      . docusate sodium (COLACE) capsule 100 mg  100 mg Oral BID Romana Juniper A, MD   100 mg at 08/13/18 2145  . enoxaparin (LOVENOX) injection 30 mg  30 mg Subcutaneous Q24H Romana Juniper A, MD   30 mg at 08/14/18 0749  . gabapentin (NEURONTIN) capsule 300 mg  300 mg Oral TID Romana Juniper A, MD   300 mg at 08/13/18 2146  . hydrALAZINE (APRESOLINE) injection 10 mg  10 mg Intravenous Q2H PRN Romana Juniper A, MD      . hydrochlorothiazide (HYDRODIURIL) tablet 25 mg  25 mg Oral Daily Romana Juniper A, MD   25 mg at 08/13/18 1729  . ketorolac (TORADOL) 30 MG/ML injection 30 mg  30 mg Intravenous Q6H PRN Romana Juniper A, MD      . methocarbamol (ROBAXIN) tablet 500 mg  500 mg Oral Q6H PRN Romana Juniper A, MD   500 mg at 08/14/18 0749  . metoprolol tartrate (LOPRESSOR) injection 5 mg  5 mg Intravenous Q6H PRN Romana Juniper A, MD      . ondansetron (ZOFRAN-ODT) disintegrating tablet 4 mg  4 mg Oral Q6H PRN Clovis Riley, MD       Or  . ondansetron (ZOFRAN) injection 4 mg  4 mg Intravenous Q6H PRN Romana Juniper A, MD      . oxyCODONE (Oxy IR/ROXICODONE) immediate release tablet 5 mg  5 mg Oral Q6H PRN Clovis Riley, MD   5 mg at 08/13/18 1935  . senna (SENOKOT) tablet 8.6 mg  1 tablet Oral BID Romana Juniper A, MD   8.6 mg at 08/13/18 2146  . simethicone (MYLICON) chewable tablet 40 mg  40 mg Oral Q6H PRN Romana Juniper A, MD      . traMADol Veatrice Bourbon) tablet 50 mg  50 mg Oral Q6H PRN Clovis Riley, MD   50 mg at 08/14/18 0749  . zolpidem (AMBIEN) tablet 5 mg  5 mg Oral QHS PRN Clovis Riley, MD         No Known Allergies  Signed: Morton Peters, MD, FACS, MASCRS Gastrointestinal and Minimally Invasive Surgery    [address] [place], [place] [address] [phone] Main / Paging [phone] Fax   08/14/2018, 8:25 AM

## 2018-08-14 NOTE — Progress Notes (Signed)
Discharged home. Home discharge instruction given, no question verbalized.

## 2018-08-16 ENCOUNTER — Encounter (HOSPITAL_COMMUNITY): Payer: Self-pay | Admitting: Surgery

## 2018-08-16 NOTE — Anesthesia Postprocedure Evaluation (Signed)
Anesthesia Post Note  Patient: Shirley Terrell  Procedure(s) Performed: LAPAROSCOPIC INCISIONAL HERNIA REPAIR (N/A Abdomen) INSERTION OF MESH (N/A Abdomen)     Patient location during evaluation: PACU Anesthesia Type: General Level of consciousness: awake and alert Pain management: pain level controlled Vital Signs Assessment: post-procedure vital signs reviewed and stable Respiratory status: spontaneous breathing, nonlabored ventilation, respiratory function stable and patient connected to nasal cannula oxygen Cardiovascular status: blood pressure returned to baseline and stable Postop Assessment: no apparent nausea or vomiting Anesthetic complications: no    Last Vitals:  Vitals:   08/14/18 0529 08/14/18 1344  BP: 116/77 124/82  Pulse: 91 83  Resp: 16   Temp: 36.9 C 37.2 C  SpO2: 100% 100%    Last Pain:  Vitals:   08/14/18 1528  TempSrc:   PainSc: 7    Pain Goal: Patients Stated Pain Goal: 0 (08/13/18 1935)               [name] [name]

## 2018-08-24 ENCOUNTER — Ambulatory Visit: Payer: Medicaid Other | Attending: Nurse Practitioner | Admitting: Nurse Practitioner

## 2018-08-24 ENCOUNTER — Other Ambulatory Visit (HOSPITAL_COMMUNITY)
Admission: RE | Admit: 2018-08-24 | Discharge: 2018-08-24 | Disposition: A | Discharge status: Home / Self Care | Payer: Medicaid Other | Source: Ambulatory Visit | Attending: Nurse Practitioner | Admitting: Nurse Practitioner

## 2018-08-24 ENCOUNTER — Encounter: Payer: Self-pay | Admitting: Nurse Practitioner

## 2018-08-24 VITALS — BP 132/93 | HR 101 | Temp 99.4°F | Ht 63.0 in | Wt 230.0 lb

## 2018-08-24 DIAGNOSIS — Z01419 Encounter for gynecological examination (general) (routine) without abnormal findings: Secondary | ICD-10-CM | POA: Diagnosis not present

## 2018-08-24 DIAGNOSIS — G8918 Other acute postprocedural pain: Secondary | ICD-10-CM | POA: Diagnosis not present

## 2018-08-24 DIAGNOSIS — Z9049 Acquired absence of other specified parts of digestive tract: Secondary | ICD-10-CM | POA: Diagnosis not present

## 2018-08-24 DIAGNOSIS — Z79899 Other long term (current) drug therapy: Secondary | ICD-10-CM | POA: Diagnosis not present

## 2018-08-24 DIAGNOSIS — Z9889 Other specified postprocedural states: Secondary | ICD-10-CM | POA: Insufficient documentation

## 2018-08-24 DIAGNOSIS — Z823 Family history of stroke: Secondary | ICD-10-CM | POA: Diagnosis not present

## 2018-08-24 DIAGNOSIS — Z8249 Family history of ischemic heart disease and other diseases of the circulatory system: Secondary | ICD-10-CM | POA: Insufficient documentation

## 2018-08-24 DIAGNOSIS — I10 Essential (primary) hypertension: Secondary | ICD-10-CM | POA: Diagnosis not present

## 2018-08-24 DIAGNOSIS — Z124 Encounter for screening for malignant neoplasm of cervix: Secondary | ICD-10-CM | POA: Diagnosis not present

## 2018-08-24 DIAGNOSIS — Z833 Family history of diabetes mellitus: Secondary | ICD-10-CM | POA: Diagnosis not present

## 2018-08-24 DIAGNOSIS — Z3046 Encounter for surveillance of implantable subdermal contraceptive: Secondary | ICD-10-CM

## 2018-08-24 DIAGNOSIS — Z79891 Long term (current) use of opiate analgesic: Secondary | ICD-10-CM | POA: Diagnosis not present

## 2018-08-24 MED ORDER — ACETAMINOPHEN-CODEINE #3 300-30 MG PO TABS: 1.0000 | ORAL_TABLET | Freq: Four times a day (QID) | ORAL | 0 refills | Status: AC | PRN

## 2018-08-24 NOTE — Progress Notes (Signed)
Assessment & Plan:  Shirley Terrell was seen today for follow-up and gynecologic exam.  Diagnoses and all orders for this visit:  Encounter for Papanicolaou smear for cervical cancer screening -     Cytology - PAP  Essential hypertension Continue all antihypertensives as prescribed.  Remember to bring in your blood pressure log with you for your follow up appointment.  DASH/Mediterranean Diets are healthier choices for HTN.   Implanon removal -     Ambulatory referral to Gynecology  Post-op pain -     acetaminophen-codeine (TYLENOL #3) 300-30 MG tablet; Take 1-2 tablets by mouth every 6 (six) hours as needed for up to 7 days for moderate pain.  Patient has been counseled on age-appropriate routine health concerns for screening and prevention. These are reviewed and up-to-date. Referrals have been placed accordingly. Immunizations are up-to-date or declined.    Subjective:   Chief Complaint  Patient presents with  . Follow-up    Pt. is here to follow-up on blood pressure.   . Gynecologic Exam    Pt. is here for a pap smear.    HPI Shirley Terrell 32 y.o. female presents to office today for follow up to BP and for PAP smear.   Essential Hypertension She endorses medication compliance. Blood pressure typically well controlled however she is endorsing pain from recent Laparoscopic incisional hernia repair. Current medications for BP control: amlodipine 5mg  daily and HCTZ 25mg  daily.  Denies chest pain, shortness of breath, palpitations, lightheadedness, dizziness, headaches or BLE edema.  BP Readings from Last 3 Encounters:  08/24/18 (!) 132/93  08/14/18 124/82  07/08/18 120/74   Review of Systems  Constitutional: Negative.  Negative for chills, fever, malaise/fatigue and weight loss.  Respiratory: Negative.  Negative for cough, shortness of breath and wheezing.   Cardiovascular: Negative.  Negative for chest pain, orthopnea and leg swelling.  Gastrointestinal: Positive for  abdominal pain.  Genitourinary: Negative.  Negative for flank pain.  Skin: Negative.  Negative for rash.  Psychiatric/Behavioral: Negative for suicidal ideas.    Past Medical History:  Diagnosis Date  . Anemia    takes iron supplement  . Asthma    prn inhaler  . Hypertension    states BP normalized while pregnant and since delivery; is currently not on med.  . Lisfranc dislocation 11/30/2016   left  . Sleep apnea    uses cpap    Past Surgical History:  Procedure Laterality Date  . CHOLECYSTECTOMY  04/13/2012   Procedure: LAPAROSCOPIC CHOLECYSTECTOMY WITH INTRAOPERATIVE CHOLANGIOGRAM;  Surgeon: Adolph Pollack, MD;  Location: [facility] OR;  Service: General;  Laterality: N/A;  laparoscopic cholecystectomy with IOC  . INCISIONAL HERNIA REPAIR  08/13/2018   LAPROSCOPIC  . INCISIONAL HERNIA REPAIR N/A 08/13/2018   Procedure: LAPAROSCOPIC INCISIONAL HERNIA REPAIR;  Surgeon: Berna Bue, MD;  Location: [facility] OR;  Service: General;  Laterality: N/A;  . INSERTION OF MESH N/A 08/13/2018   Procedure: INSERTION OF MESH;  Surgeon: Berna Bue, MD;  Location: [facility] OR;  Service: General;  Laterality: N/A;  . OPEN REDUCTION INTERNAL FIXATION (ORIF) FOOT LISFRANC FRACTURE Left 12/18/2016   Procedure: OPEN REDUCTION INTERNAL FIXATION (ORIF) FOOT LISFRANC FRACTURE;  Surgeon: Toni Arthurs, MD;  Location: [facility];  Service: Orthopedics;  Laterality: Left;    Family History  Problem Relation Age of Onset  . Hypertension Mother   . Stroke Mother   . Renal Disease Mother        due to blood pressure  .  Heart disease Father        MI, no stents  . Depression Father   . Hypertension Father   . Stroke Father   . Cancer Paternal Grandmother        breast  . Diabetes Paternal Grandmother   . Cancer Paternal Grandfather   . Hypertension Maternal Grandmother   . Diabetes Other   . Healthy Sister   . Healthy Sister   . Healthy Sister   . Healthy Sister     Social History  Reviewed with no changes to be made today.   Outpatient Medications Prior to Visit  Medication Sig Dispense Refill  . acetaminophen (TYLENOL) 500 MG tablet Take 1,000 mg by mouth every 6 (six) hours as needed for moderate pain or headache.    . albuterol (PROVENTIL HFA;VENTOLIN HFA) 108 (90 BASE) MCG/ACT inhaler Inhale 2 puffs into the lungs every 6 (six) hours as needed for wheezing. Reported on 04/11/2016    . amLODipine (NORVASC) 5 MG tablet Take 1 tablet (5 mg total) by mouth daily. 90 tablet 3  . carvedilol (COREG) 12.5 MG tablet Take 1 tablet (12.5 mg total) by mouth 2 (two) times daily with a meal. 60 tablet 3  . docusate sodium (COLACE) 100 MG capsule Take 1 capsule (100 mg total) by mouth 2 (two) times daily. 60 capsule 0  . ferrous sulfate 325 (65 FE) MG tablet Take 325 mg by mouth daily.    . hydrochlorothiazide (HYDRODIURIL) 25 MG tablet Take 1 tablet (25 mg total) by mouth daily. 90 tablet 3  . Misc. Devices MISC Please provide patient with an insurance approved CPAP.  Settings should be as follows: DME autopap 5-15.  Medium Cushion size [employer] Respironics Minimal Data processing manager Under Nose Frame (M) mask and heated humidification. 1 each 0  . nicotine (NICODERM CQ - DOSED IN MG/24 HOURS) 14 mg/24hr patch Place 1 patch (14 mg total) onto the skin daily. 42 patch 0  . oxyCODONE (OXY IR/ROXICODONE) 5 MG immediate release tablet Take 1 tablet (5 mg total) by mouth every 6 (six) hours as needed for severe pain or breakthrough pain. (Patient not taking: Reported on 08/24/2018) 20 tablet 0  . PAZEO 0.7 % SOLN Place 1 drop into both eyes daily as needed for allergies.  3  . RESTASIS 0.05 % ophthalmic emulsion Place 1 drop into both eyes 2 (two) times daily as needed for dry eyes.  2   No facility-administered medications prior to visit.     No Known Allergies     Objective:    BP (!) 132/93 (BP Location: Left Arm, Patient Position: Sitting, Cuff Size: Large)   Pulse (!) 101    Temp 99.4 F (37.4 C) (Oral)   Ht 5\' 3"  (1.6 m)   Wt 230 lb (104.3 kg)   LMP 07/25/2018   SpO2 93%   BMI 40.74 kg/m  Wt Readings from Last 3 Encounters:  08/24/18 230 lb (104.3 kg)  08/13/18 226 lb (102.5 kg)  07/08/18 229 lb (103.9 kg)    Physical Exam  Constitutional: She is oriented to person, place, and time. She appears well-developed and well-nourished.  HENT:  Head: Normocephalic.  Cardiovascular: Normal rate, regular rhythm and normal heart sounds.  Pulmonary/Chest: Effort normal and breath sounds normal.  Abdominal: She exhibits distension. There is tenderness. Hernia confirmed negative in the right inguinal area and confirmed negative in the left inguinal area.  She has an abdominal binder on today.   Genitourinary: Rectum  normal, vagina normal and uterus normal. Rectal exam shows no external hemorrhoid. No labial fusion. There is no rash, tenderness, lesion or injury on the right labia. There is no rash, tenderness, lesion or injury on the left labia. Uterus is not deviated and not enlarged. Cervix exhibits discharge. Cervix exhibits no motion tenderness and no friability. Right adnexum displays no mass, no tenderness and no fullness. Left adnexum displays no mass, no tenderness and no fullness. No erythema, tenderness or bleeding in the vagina. No foreign body in the vagina. No signs of injury around the vagina. No vaginal discharge found.  Lymphadenopathy: No inguinal adenopathy noted on the right or left side.       Right: No inguinal adenopathy present.       Left: No inguinal adenopathy present.  Neurological: She is alert and oriented to person, place, and time.  Skin: Skin is warm and dry.  Psychiatric: She has a normal mood and affect. Her behavior is normal. Judgment and thought content normal.         Patient has been counseled extensively about nutrition and exercise as well as the importance of adherence with medications and regular follow-up. The patient was  given clear instructions to go to ER or return to medical center if symptoms don't improve, worsen or new problems develop. The patient verbalized understanding.   Follow-up: Return in about 3 months (around 11/24/2018) for HTN.   Claiborne Rigg, FNP-BC [facility] [place], [place] [phone]   08/24/2018, 9:39 AM

## 2018-08-24 NOTE — Patient Instructions (Signed)
HPV Test The human papillomavirus (HPV) test is used to look for high-risk types of HPV infection. HPV is a group of about 100 viruses. Many of these viruses cause growths on, in, or around the genitals. Most HPV viruses cause infections that usually go away without treatment. However, HPV types 6, 11, 16, and 18 are considered high-risk types of HPV that can increase your risk of cancer of the cervix or anus if the infection is left untreated. An HPV test identifies the DNA (genetic) strands of the HPV infection, so it is also referred to as the HPV DNA test. Although HPV is found in both males and females, the HPV test is only used to screen for increased cancer risk in females:  With an abnormal Pap test.  After treatment of an abnormal Pap test.  Between the ages of 68 and 62.  After treatment of a high-risk HPV infection.  The HPV test may be done at the same time as a pelvic exam and Pap test in females over the age of 13. Both the HPV test and Pap test require a sample of cells from the cervix. How do I prepare for this test?  Do not douche or take a bath for 24-48 hours before the test or as directed by your health care provider.  Do not have sex for 24-48 hours before the test or as directed by your health care provider.  You may be asked to reschedule the test if you are menstruating.  You will be asked to urinate before the test. What do the results mean? It is your responsibility to obtain your test results. Ask the lab or department performing the test when and how you will get your results. Talk with your health care provider if you have any questions about your results. Your result will be negative or positive. Meaning of Negative Test Results A negative HPV test result means that no HPV was found, and it is very likely that you do not have HPV. Meaning of Positive Test Results A positive HPV test result indicates that you have HPV.  If your test result shows the presence  of any high-risk HPV strains, you may have an increased risk of developing cancer of the cervix or anus if the infection is left untreated.  If any low-risk HPV strains are found, you are not likely to have an increased risk of cancer.  Discuss your test results with your health care provider. He or she will use the results to make a diagnosis and determine a treatment plan that is right for you. Talk with your health care provider to discuss your results, treatment options, and if necessary, the need for more tests. Talk with your health care provider if you have any questions about your results. This information is not intended to replace advice given to you by your health care provider. Make sure you discuss any questions you have with your health care provider. Document Released: 11/28/2004 Document Revised: 07/09/2016 Document Reviewed: 03/21/2014 Elsevier Interactive Patient Education  2018 [employer]. Pap Test Why am I having this test? A pap test is sometimes called a pap smear. It is a screening test that is used to check for signs of cancer of the vagina, cervix, and uterus. The test can also identify the presence of infection or precancerous changes. Your health care provider will likely recommend you have this test done on a regular basis. This test may be done:  Every 3 years, starting at  age 17.  Every 5 years, in combination with testing for the presence of human papillomavirus (HPV).  More or less often depending on other medical conditions.  What kind of sample is taken? Using a small cotton swab, plastic spatula, or brush, your health care provider will collect a sample of cells from the surface of your cervix. Your cervix is the opening to your uterus, also called a womb. Secretions from the cervix and vagina may also be collected. How do I prepare for this test?  Be aware of where you are in your menstrual cycle. You may be asked to reschedule the test if you are  menstruating on the day of the test.  You may need to reschedule if you have a known vaginal infection on the day of the test.  You may be asked to avoid douching or taking a bath the day before or the day of the test.  Some medicines can cause abnormal test results, such as digitalis and tetracycline. Talk with your health care provider before your test if you take one of these medicines. What do the results mean? Abnormal test results may indicate a number of health conditions. These may include:  Cancer. Although pap test results cannot be used to diagnose cancer of the cervix, vagina, or uterus, they may suggest the possibility of cancer. Further tests would be required to determine if cancer is present.  Sexually transmitted disease.  Fungal infection.  Parasite infection.  Herpes infection.  A condition causing or contributing to infertility.  It is your responsibility to obtain your test results. Ask the lab or department performing the test when and how you will get your results. Contact your health care provider to discuss any questions you have about your results. Talk with your health care provider to discuss your results, treatment options, and if necessary, the need for more tests. Talk with your health care provider if you have any questions about your results. This information is not intended to replace advice given to you by your health care provider. Make sure you discuss any questions you have with your health care provider. Document Released: 01/24/2003 Document Revised: 07/09/2016 Document Reviewed: 03/27/2014 Elsevier Interactive Patient Education  [employer].

## 2018-08-27 LAB — CYTOLOGY - PAP
BACTERIAL VAGINITIS: POSITIVE — AB
CANDIDA VAGINITIS: NEGATIVE
Chlamydia: NEGATIVE
DIAGNOSIS: NEGATIVE
HPV: NOT DETECTED
NEISSERIA GONORRHEA: NEGATIVE
Trichomonas: NEGATIVE

## 2018-08-31 ENCOUNTER — Other Ambulatory Visit: Payer: Self-pay | Admitting: Nurse Practitioner

## 2018-08-31 MED ORDER — METRONIDAZOLE 500 MG PO TABS: 500.0000 mg | ORAL_TABLET | Freq: Two times a day (BID) | ORAL | 0 refills | Status: AC

## 2018-09-01 ENCOUNTER — Telehealth: Payer: Self-pay

## 2018-09-01 NOTE — Telephone Encounter (Signed)
CMA spoke to patient to inform on results.  Pt. Verified DOB. Pt. Understood.  Pt. Is aware of Rx sent.

## 2018-09-01 NOTE — Telephone Encounter (Signed)
-----   Message from Claiborne Rigg, NP sent at 08/31/2018 10:51 PM EDT ----- Pap smear negative for cervical cancer and positive for bacterial vaginosis. Will send script to pharmacy

## 2018-09-21 ENCOUNTER — Encounter: Payer: Self-pay | Admitting: Obstetrics & Gynecology

## 2018-09-21 ENCOUNTER — Ambulatory Visit: Payer: Medicaid Other | Admitting: Obstetrics & Gynecology

## 2018-09-21 ENCOUNTER — Other Ambulatory Visit: Payer: Self-pay

## 2018-09-21 VITALS — BP 129/88 | HR 108 | Temp 98.2°F | Ht 63.0 in | Wt 226.0 lb

## 2018-09-21 DIAGNOSIS — Z3046 Encounter for surveillance of implantable subdermal contraceptive: Secondary | ICD-10-CM

## 2018-09-21 DIAGNOSIS — Z3009 Encounter for other general counseling and advice on contraception: Secondary | ICD-10-CM

## 2018-09-21 NOTE — Patient Instructions (Signed)
Nexplanon Instructions After Insertion   Keep bandage clean and dry for 24 hours   May use ice/Tylenol/Ibuprofen for soreness or pain   If you develop fever, drainage or increased warmth from incision site-contact office immediately  Preventing Unhealthy Weight Gain, Adult Staying at a healthy weight is important. When fat builds up in your body, you may become overweight or obese. These conditions put you at greater risk for developing certain health problems, such as heart disease, diabetes, sleeping problems, joint problems, and some cancers. Unhealthy weight gain is often the result of making unhealthy choices in what you eat. It is also a result of not getting enough exercise. You can make changes to your lifestyle to prevent obesity and stay as healthy as possible. What nutrition changes can be made? To maintain a healthy weight and prevent obesity:  Eat only as much as your body needs. To do this: ? Pay attention to signs that you are hungry or full. Stop eating as soon as you feel full. ? If you feel hungry, try drinking water first. Drink enough water so your urine is clear or pale yellow. ? Eat smaller portions. ? Look at serving sizes on food labels. Most foods contain more than one serving per container. ? Eat the recommended amount of calories for your gender and activity level. While most active people should eat around 2,000 calories per day, if you are trying to lose weight or are not very active, you main need to eat less calories. Talk to your health care provider or dietitian about how many calories you should eat each day.  Choose healthy foods, such as: ? Fruits and vegetables. Try to fill at least half of your plate at each meal with fruits and vegetables. ? Whole grains, such as whole wheat bread, brown rice, and quinoa. ? Lean meats, such as chicken or fish. ? Other healthy proteins, such as beans, eggs, or tofu. ? Healthy fats, such as nuts, seeds, fatty fish,  and olive oil. ? Low-fat or fat-free dairy.  Check food labels and avoid food and drinks that: ? Are high in calories. ? Have added sugar. ? Are high in sodium. ? Have saturated fats or trans fats.  Limit how much you eat of the following foods: ? Prepackaged meals. ? Fast food. ? Fried foods. ? Processed meat, such as bacon, sausage, and deli meats. ? Fatty cuts of red meat and poultry with skin.  Cook foods in healthier ways, such as by baking, broiling, or grilling.  When grocery shopping, try to shop around the outside of the store. This helps you buy mostly fresh foods and avoid canned and prepackaged foods.  What lifestyle changes can be made?  Exercise at least 30 minutes 5 or more days each week. Exercising includes brisk walking, yard work, biking, running, swimming, and team sports like basketball and soccer. Ask your health care provider which exercises are safe for you.  Do not use any products that contain nicotine or tobacco, such as cigarettes and e-cigarettes. If you need help quitting, ask your health care provider.  Limit alcohol intake to no more than 1 drink a day for nonpregnant women and 2 drinks a day for men. One drink equals 12 oz of beer, 5 oz of wine, or 1 oz of hard liquor.  Try to get 7-9 hours of sleep each night. What other changes can be made?  Keep a food and activity journal to keep track of: ? What you ate  and how many calories you had. Remember to count sauces, dressings, and side dishes. ? Whether you were active, and what exercises you did. ? Your calorie, weight, and activity goals.  Check your weight regularly. Track any changes. If you notice you have gained weight, make changes to your diet or activity routine.  Avoid taking weight-loss medicines or supplements. Talk to your health care provider before starting any new medicine or supplement.  Talk to your health care provider before trying any new diet or exercise plan. Why are  these changes important? Eating healthy, staying active, and having healthy habits not only help prevent obesity, they also:  Help you to manage stress and emotions.  Help you to connect with friends and family.  Improve your self-esteem.  Improve your sleep.  Prevent long-term health problems.  What can happen if changes are not made? Being obese or overweight can cause you to develop joint or bone problems, which can make it hard for you to stay active or do activities you enjoy. Being obese or overweight also puts stress on your heart and lungs and can lead to health problems like diabetes, heart disease, and some cancers. Where to find more information: Talk with your health care provider or a dietitian about healthy eating and healthy lifestyle choices. You may also find other information through these resources:  U.S. Department of Agriculture MyPlate: https://ball-collins.biz/  American Heart Association: www.heart.org  Centers for Disease Control and Prevention: FootballExhibition.com.br  Summary  Staying at a healthy weight is important. It helps prevent certain diseases and health problems, such as heart disease, diabetes, joint problems, sleep disorders, and some cancers.  Being obese or overweight can cause you to develop joint or bone problems, which can make it hard for you to stay active or do activities you enjoy.  You can prevent unhealthy weight gain by eating a healthy diet, exercising regularly, not smoking, limiting alcohol, and getting enough sleep.  Talk with your health care provider or a dietitian for guidance about healthy eating and healthy lifestyle choices. This information is not intended to replace advice given to you by your health care provider. Make sure you discuss any questions you have with your health care provider. Document Released: 11/04/2016 Document Revised: 12/10/2016 Document Reviewed: 12/10/2016 Elsevier Interactive Patient Education  [employer].

## 2018-09-21 NOTE — Progress Notes (Signed)
Patient given informed consent, she signed consent form. She has gained >40# since her Nexplanon was placed.  She wants it removed.  Appropriate time out taken.  Patient's left arm was prepped and draped in the usual sterile fashion. Patient was prepped with alcohol swab and then injected with 3 ml of 1 % lidocaine.  She was prepped with betadine.  A #11 blade was used to make a small incision over the Nexplanon rod.  Using curved forceps, the end of the rod was grasped and the scar tissue was removed and the device was removed intact.  There was minimal blood loss. The incision site was covered with guaze and a pressure bandage to reduce any bruising.  The patient tolerated the procedure well and was given post procedure instructions.  Tiari Andringa L. Harraway-Smith, M.D., [facility]

## 2018-09-22 ENCOUNTER — Encounter: Payer: Self-pay | Admitting: General Practice

## 2018-09-24 ENCOUNTER — Ambulatory Visit: Payer: Medicaid Other | Admitting: Obstetrics and Gynecology

## 2018-11-24 ENCOUNTER — Ambulatory Visit: Payer: Medicaid Other | Admitting: Nurse Practitioner

## 2019-08-22 ENCOUNTER — Telehealth: Payer: Self-pay | Admitting: Nurse Practitioner

## 2019-08-22 DIAGNOSIS — I10 Essential (primary) hypertension: Secondary | ICD-10-CM

## 2019-08-22 MED ORDER — AMLODIPINE BESYLATE 5 MG PO TABS: 5.0000 mg | ORAL_TABLET | Freq: Every day | ORAL | 0 refills | Status: DC

## 2019-08-22 MED ORDER — HYDROCHLOROTHIAZIDE 25 MG PO TABS: 25.0000 mg | ORAL_TABLET | Freq: Every day | ORAL | 0 refills | Status: DC

## 2019-08-22 NOTE — Telephone Encounter (Signed)
Sent!

## 2019-08-22 NOTE — Telephone Encounter (Signed)
1) Medication(s) Requested (by name): -hydrochlorothiazide (HYDRODIURIL) 25 MG tablet  -amLODipine (NORVASC) 5 MG tablet   2) Pharmacy of Choice: -[id number] - [place], [place] - [address] 3) Special Requests: Until next appt

## 2019-09-05 ENCOUNTER — Ambulatory Visit (HOSPITAL_BASED_OUTPATIENT_CLINIC_OR_DEPARTMENT_OTHER): Payer: Medicaid Other | Admitting: Pharmacist

## 2019-09-05 ENCOUNTER — Ambulatory Visit: Payer: Medicaid Other | Attending: Nurse Practitioner | Admitting: Nurse Practitioner

## 2019-09-05 ENCOUNTER — Other Ambulatory Visit: Payer: Self-pay

## 2019-09-05 ENCOUNTER — Encounter: Payer: Self-pay | Admitting: Nurse Practitioner

## 2019-09-05 VITALS — BP 158/103 | HR 91 | Temp 99.1°F | Ht 63.0 in | Wt 240.0 lb

## 2019-09-05 DIAGNOSIS — Z23 Encounter for immunization: Secondary | ICD-10-CM

## 2019-09-05 DIAGNOSIS — G4733 Obstructive sleep apnea (adult) (pediatric): Secondary | ICD-10-CM | POA: Diagnosis not present

## 2019-09-05 DIAGNOSIS — Z833 Family history of diabetes mellitus: Secondary | ICD-10-CM | POA: Diagnosis not present

## 2019-09-05 DIAGNOSIS — Z809 Family history of malignant neoplasm, unspecified: Secondary | ICD-10-CM | POA: Diagnosis not present

## 2019-09-05 DIAGNOSIS — R7309 Other abnormal glucose: Secondary | ICD-10-CM | POA: Diagnosis not present

## 2019-09-05 DIAGNOSIS — I10 Essential (primary) hypertension: Secondary | ICD-10-CM | POA: Diagnosis present

## 2019-09-05 DIAGNOSIS — Z79899 Other long term (current) drug therapy: Secondary | ICD-10-CM | POA: Insufficient documentation

## 2019-09-05 DIAGNOSIS — Z823 Family history of stroke: Secondary | ICD-10-CM | POA: Insufficient documentation

## 2019-09-05 DIAGNOSIS — F17210 Nicotine dependence, cigarettes, uncomplicated: Secondary | ICD-10-CM | POA: Insufficient documentation

## 2019-09-05 DIAGNOSIS — F17200 Nicotine dependence, unspecified, uncomplicated: Secondary | ICD-10-CM

## 2019-09-05 DIAGNOSIS — D649 Anemia, unspecified: Secondary | ICD-10-CM | POA: Diagnosis not present

## 2019-09-05 DIAGNOSIS — J45909 Unspecified asthma, uncomplicated: Secondary | ICD-10-CM | POA: Diagnosis not present

## 2019-09-05 DIAGNOSIS — Z8249 Family history of ischemic heart disease and other diseases of the circulatory system: Secondary | ICD-10-CM | POA: Diagnosis not present

## 2019-09-05 DIAGNOSIS — R739 Hyperglycemia, unspecified: Secondary | ICD-10-CM | POA: Insufficient documentation

## 2019-09-05 DIAGNOSIS — Z9119 Patient's noncompliance with other medical treatment and regimen: Secondary | ICD-10-CM | POA: Insufficient documentation

## 2019-09-05 DIAGNOSIS — D72829 Elevated white blood cell count, unspecified: Secondary | ICD-10-CM | POA: Diagnosis not present

## 2019-09-05 DIAGNOSIS — Z803 Family history of malignant neoplasm of breast: Secondary | ICD-10-CM | POA: Insufficient documentation

## 2019-09-05 DIAGNOSIS — Z6841 Body Mass Index (BMI) 40.0 and over, adult: Secondary | ICD-10-CM | POA: Diagnosis not present

## 2019-09-05 DIAGNOSIS — Z9049 Acquired absence of other specified parts of digestive tract: Secondary | ICD-10-CM | POA: Diagnosis not present

## 2019-09-05 MED ORDER — AMLODIPINE BESYLATE 5 MG PO TABS: 5.0000 mg | ORAL_TABLET | Freq: Every day | ORAL | 0 refills | Status: DC

## 2019-09-05 MED ORDER — HYDROCHLOROTHIAZIDE 25 MG PO TABS: 25.0000 mg | ORAL_TABLET | Freq: Every day | ORAL | 0 refills | Status: DC

## 2019-09-05 MED ORDER — CARVEDILOL 12.5 MG PO TABS: 12.5000 mg | ORAL_TABLET | Freq: Two times a day (BID) | ORAL | 3 refills | Status: DC

## 2019-09-05 MED ORDER — BLOOD PRESSURE MONITOR DEVI: 0 refills | Status: DC

## 2019-09-05 NOTE — Progress Notes (Signed)
Patient presents for vaccination against influenza per orders of Zelda. Consent given. Counseling provided. No contraindications exists. Vaccine administered without incident.   

## 2019-09-05 NOTE — Patient Instructions (Signed)
Living With Sleep Apnea Sleep apnea is a condition in which breathing pauses or becomes shallow during sleep. Sleep apnea is most commonly caused by a collapsed or blocked airway. People with sleep apnea snore loudly and have times when they gasp and stop breathing for 10 seconds or more during sleep. This happens over and over during the night. This disrupts your sleep and keeps your body from getting the rest that it needs, which can cause tiredness and lack of energy (fatigue) during the day. The breaks in breathing also interrupt the deep sleep that you need to feel rested. Even if you do not completely wake up from the gaps in breathing, your sleep may not be restful. You may also have a headache in the morning and low energy during the day, and you may feel anxious or depressed. How can sleep apnea affect me? Sleep apnea increases your chances of extreme tiredness during the day (daytime fatigue). It can also increase your risk for health conditions, such as:  Heart attack.  Stroke.  Diabetes.  Heart failure.  Irregular heartbeat.  High blood pressure. If you have daytime fatigue as a result of sleep apnea, you may be more likely to:  Perform poorly at school or work.  Fall asleep while driving.  Have difficulty with attention.  Develop depression or anxiety.  Become severely overweight (obese).  Have sexual dysfunction. What actions can I take to manage sleep apnea? Sleep apnea treatment   If you were given a device to open your airway while you sleep, use it only as told by your health care provider. You may be given: ? An oral appliance. This is a custom-made mouthpiece that shifts your lower jaw forward. ? A continuous positive airway pressure (CPAP) device. This device blows air through a mask when you breathe out (exhale). ? A nasal expiratory positive airway pressure (EPAP) device. This device has valves that you put into each nostril. ? A bi-level positive airway  pressure (BPAP) device. This device blows air through a mask when you breathe in (inhale) and breathe out (exhale).  You may need surgery if other treatments do not work for you. Sleep habits  Go to sleep and wake up at the same time every day. This helps set your internal clock (circadian rhythm) for sleeping. ? If you stay up later than usual, such as on weekends, try to get up in the morning within 2 hours of your normal wake time.  Try to get at least 7-9 hours of sleep each night.  Stop computer, tablet, and mobile phone use a few hours before bedtime.  Do not take long naps during the day. If you nap, limit it to 30 minutes.  Have a relaxing bedtime routine. Reading or listening to music may relax you and help you sleep.  Use your bedroom only for sleep. ? Keep your television and computer out of your bedroom. ? Keep your bedroom cool, dark, and quiet. ? Use a supportive mattress and pillows.  Follow your health care provider's instructions for other changes to sleep habits. Nutrition  Do not eat heavy meals in the evening.  Do not have caffeine in the later part of the day. The effects of caffeine can last for more than 5 hours.  Follow your health care provider's or dietitian's instructions for any diet changes. Lifestyle      Do not drink alcohol before bedtime. Alcohol can cause you to fall asleep at first, but then it can cause   you to wake up in the middle of the night and have trouble getting back to sleep.  Do not use any products that contain nicotine or tobacco, such as cigarettes and e-cigarettes. If you need help quitting, ask your health care provider. Medicines  Take over-the-counter and prescription medicines only as told by your health care provider.  Do not use over-the-counter sleep medicine. You can become dependent on this medicine, and it can make sleep apnea worse.  Do not use medicines, such as sedatives and narcotics, unless told by your health  care provider. Activity  Exercise on most days, but avoid exercising in the evening. Exercising near bedtime can interfere with sleeping.  If possible, spend time outside every day. Natural light helps regulate your circadian rhythm. General information  Lose weight if you need to, and maintain a healthy weight.  Keep all follow-up visits as told by your health care provider. This is important.  If you are having surgery, make sure to tell your health care provider that you have sleep apnea. You may need to bring your device with you. Where to find more information Learn more about sleep apnea and daytime fatigue from:  American Sleep Association: sleepassociation.org  National Sleep Foundation: sleepfoundation.org  National Heart, Lung, and Blood Institute: [employer] Summary  Sleep apnea can cause daytime fatigue and other serious health conditions.  Both sleep apnea and daytime fatigue can be bad for your health and well-being.  You may need to wear a device while sleeping to help keep your airway open.  If you are having surgery, make sure to tell your health care provider that you have sleep apnea. You may need to bring your device with you.  Making changes to sleep habits, diet, lifestyle, and activity can help you manage sleep apnea. This information is not intended to replace advice given to you by your health care provider. Make sure you discuss any questions you have with your health care provider. Document Released: 01/28/2018 Document Revised: 02/25/2019 Document Reviewed: 01/28/2018 Elsevier Patient Education  2020 Elsevier Inc.  Sleep Apnea Sleep apnea is a condition in which breathing pauses or becomes shallow during sleep. Episodes of sleep apnea usually last 10 seconds or longer, and they may occur as many as 20 times an hour. Sleep apnea disrupts your sleep and keeps your body from getting the rest that it needs. This condition can increase your risk of  certain health problems, including:  Heart attack.  Stroke.  Obesity.  Diabetes.  Heart failure.  Irregular heartbeat. What are the causes? There are three kinds of sleep apnea:  Obstructive sleep apnea. This kind is caused by a blocked or collapsed airway.  Central sleep apnea. This kind happens when the part of the brain that controls breathing does not send the correct signals to the muscles that control breathing.  Mixed sleep apnea. This is a combination of obstructive and central sleep apnea. The most common cause of this condition is a collapsed or blocked airway. An airway can collapse or become blocked if:  Your throat muscles are abnormally relaxed.  Your tongue and tonsils are larger than normal.  You are overweight.  Your airway is smaller than normal. What increases the risk? You are more likely to develop this condition if you:  Are overweight.  Smoke.  Have a smaller than normal airway.  Are elderly.  Are female.  Drink alcohol.  Take sedatives or tranquilizers.  Have a family history of sleep apnea. What are the  signs or symptoms? Symptoms of this condition include:  Trouble staying asleep.  Daytime sleepiness and tiredness.  Irritability.  Loud snoring.  Morning headaches.  Trouble concentrating.  Forgetfulness.  Decreased interest in sex.  Unexplained sleepiness.  Mood swings.  Personality changes.  Feelings of depression.  Waking up often during the night to urinate.  Dry mouth.  Sore throat. How is this diagnosed? This condition may be diagnosed with:  A medical history.  A physical exam.  A series of tests that are done while you are sleeping (sleep study). These tests are usually done in a sleep lab, but they may also be done at home. How is this treated? Treatment for this condition aims to restore normal breathing and to ease symptoms during sleep. It may involve managing health issues that can affect  breathing, such as high blood pressure or obesity. Treatment may include:  Sleeping on your side.  Using a decongestant if you have nasal congestion.  Avoiding the use of depressants, including alcohol, sedatives, and narcotics.  Losing weight if you are overweight.  Making changes to your diet.  Quitting smoking.  Using a device to open your airway while you sleep, such as: ? An oral appliance. This is a custom-made mouthpiece that shifts your lower jaw forward. ? A continuous positive airway pressure (CPAP) device. This device blows air through a mask when you breathe out (exhale). ? A nasal expiratory positive airway pressure (EPAP) device. This device has valves that you put into each nostril. ? A bi-level positive airway pressure (BPAP) device. This device blows air through a mask when you breathe in (inhale) and breathe out (exhale).  Having surgery if other treatments do not work. During surgery, excess tissue is removed to create a wider airway. It is important to get treatment for sleep apnea. Without treatment, this condition can lead to:  High blood pressure.  Coronary artery disease.  In men, an inability to achieve or maintain an erection (impotence).  Reduced thinking abilities. Follow these instructions at home: Lifestyle  Make any lifestyle changes that your health care provider recommends.  Eat a healthy, well-balanced diet.  Take steps to lose weight if you are overweight.  Avoid using depressants, including alcohol, sedatives, and narcotics.  Do not use any products that contain nicotine or tobacco, such as cigarettes, e-cigarettes, and chewing tobacco. If you need help quitting, ask your health care provider. General instructions  Take over-the-counter and prescription medicines only as told by your health care provider.  If you were given a device to open your airway while you sleep, use it only as told by your health care provider.  If you are  having surgery, make sure to tell your health care provider you have sleep apnea. You may need to bring your device with you.  Keep all follow-up visits as told by your health care provider. This is important. Contact a health care provider if:  The device that you received to open your airway during sleep is uncomfortable or does not seem to be working.  Your symptoms do not improve.  Your symptoms get worse. Get help right away if:  You develop: ? Chest pain. ? Shortness of breath. ? Discomfort in your back, arms, or stomach.  You have: ? Trouble speaking. ? Weakness on one side of your body. ? Drooping in your face. These symptoms may represent a serious problem that is an emergency. Do not wait to see if the symptoms will go away. Get  medical help right away. Call your local emergency services (911 in the [place] Do not drive yourself to the hospital. Summary  Sleep apnea is a condition in which breathing pauses or becomes shallow during sleep.  The most common cause is a collapsed or blocked airway.  The goal of treatment is to restore normal breathing and to ease symptoms during sleep. This information is not intended to replace advice given to you by your health care provider. Make sure you discuss any questions you have with your health care provider. Document Released: 10/24/2002 Document Revised: 08/20/2018 Document Reviewed: 06/29/2018 Elsevier Patient Education  2020 [employer].

## 2019-09-05 NOTE — Progress Notes (Signed)
Assessment & Plan:  Shirley Terrell was seen today for follow-up.  Diagnoses and all orders for this visit:  Essential hypertension -     CMP14+EGFR -     carvedilol (COREG) 12.5 MG tablet; Take 1 tablet (12.5 mg total) by mouth 2 (two) times daily with a meal. -     amLODipine (NORVASC) 5 MG tablet; Take 1 tablet (5 mg total) by mouth daily. Must have office visit for refills -     hydrochlorothiazide (HYDRODIURIL) 25 MG tablet; Take 1 tablet (25 mg total) by mouth daily. Must have office visit for refills -     Blood Pressure Monitor DEVI; Please provide patient with insurance approved blood pressure monitor Continue all antihypertensives as prescribed.  Remember to bring in your blood pressure log with you for your follow up appointment.  DASH/Mediterranean Diets are healthier choices for HTN.   Morbid obesity with BMI of 40.0-44.9, adult ([place]) -     Lipid panel Discussed diet and exercise for person with BMI >40. Instructed: You must burn more calories than you eat. Losing 5 percent of your body weight should be considered a success. In the longer term, losing more than 15 percent of your body weight and staying at this weight is an extremely good result. However, keep in mind that even losing 5 percent of your body weight leads to important health benefits, so try not to get discouraged if you're not able to lose more than this. Will recheck weight in 3-6 months.  Leukocytosis, unspecified type -     CBC  Elevated glucose -     Hemoglobin A1c  Tobacco dependence Shirley Terrell was counseled on the dangers of tobacco use, and was advised to quit. Reviewed strategies to maximize success, including removing cigarettes and smoking materials from environment, stress management and support of family/friends as well as pharmacological alternatives including: Wellbutrin, Chantix, Nicotine patch, Nicotine gum or lozenges. Smoking cessation support: smoking cessation hotline: 1-800-QUIT-NOW.  Smoking  cessation classes are also available through [facility]. Call [phone] or visit our website at [email or web address]   A total of 4 minutes was spent on counseling for smoking cessation and Shirley Terrell is not ready to quit.   OSA (obstructive sleep apnea) Noncompliant.  Discussed the importance of nightly CPAP use.    Patient has been counseled on age-appropriate routine health concerns for screening and prevention. These are reviewed and up-to-date. Referrals have been placed accordingly. Immunizations are up-to-date or declined.    Subjective:   Chief Complaint  Patient presents with  . Follow-up   HPI Shirley Terrell 33 y.o. female presents to office today for follow up.  has a past medical history of Anemia, Asthma, Hypertension, Lisfranc dislocation (11/30/2016), and Sleep apnea.   Essential Hypertension She has not been taking her medications for over a month as she did not pick up her medications from the pharmacy. Still smoking a few cigarettes per day. Living with her mom and states it is very stressful which causes her to smoke. Denies chest pain, shortness of breath, palpitations, lightheadedness, dizziness, headaches or BLE edema.  Medications refilled today include amlodipine 5 mg daily, carvedilol 12.5 mg twice daily and hydrochlorothiazide 25 mg daily.  She does not monitor her blood pressure at home.  She is also noncompliant with her CPAP.  We discussed potential long-term complications of untreated OSA and she verbalized understanding. Blood pressure monitor was ordered through her insurance today. BP Readings from Last 3 Encounters:  09/05/19 (!) 158/103  09/21/18 129/88  08/24/18 (!) 132/93    Review of Systems  Constitutional: Negative for fever, malaise/fatigue and weight loss.  HENT: Negative.  Negative for nosebleeds.   Eyes: Negative.  Negative for blurred vision, double vision and photophobia.  Respiratory: Negative.  Negative for  cough and shortness of breath.   Cardiovascular: Negative.  Negative for chest pain, palpitations and leg swelling.  Gastrointestinal: Negative.  Negative for heartburn, nausea and vomiting.  Musculoskeletal: Negative.  Negative for myalgias.  Neurological: Negative.  Negative for dizziness, focal weakness, seizures and headaches.  Psychiatric/Behavioral: Negative.  Negative for suicidal ideas.    Past Medical History:  Diagnosis Date  . Anemia    takes iron supplement  . Asthma    prn inhaler  . Hypertension    states BP normalized while pregnant and since delivery; is currently not on med.  . Lisfranc dislocation 11/30/2016   left  . Sleep apnea    uses cpap    Past Surgical History:  Procedure Laterality Date  . CHOLECYSTECTOMY  04/13/2012   Procedure: LAPAROSCOPIC CHOLECYSTECTOMY WITH INTRAOPERATIVE CHOLANGIOGRAM;  Surgeon: Todd J Rosenbower, MD;  Location: [facility] OR;  Service: General;  Laterality: N/A;  laparoscopic cholecystectomy with IOC  . INCISIONAL HERNIA REPAIR  08/13/2018   LAPROSCOPIC  . INCISIONAL HERNIA REPAIR N/A 08/13/2018   Procedure: LAPAROSCOPIC INCISIONAL HERNIA REPAIR;  Surgeon: Connor, Chelsea A, MD;  Location: [facility] OR;  Service: General;  Laterality: N/A;  . INSERTION OF MESH N/A 08/13/2018   Procedure: INSERTION OF MESH;  Surgeon: Connor, Chelsea A, MD;  Location: [facility] OR;  Service: General;  Laterality: N/A;  . OPEN REDUCTION INTERNAL FIXATION (ORIF) FOOT LISFRANC FRACTURE Left 12/18/2016   Procedure: OPEN REDUCTION INTERNAL FIXATION (ORIF) FOOT LISFRANC FRACTURE;  Surgeon: John Hewitt, MD;  Location:  SURGERY CENTER;  Service: Orthopedics;  Laterality: Left;    Family History  Problem Relation Age of Onset  . Hypertension Mother   . Stroke Mother   . Renal Disease Mother        due to blood pressure  . Heart disease Father        MI, no stents  . Depression Father   . Hypertension Father   . Stroke Father   . Cancer Paternal Grandmother         breast  . Diabetes Paternal Grandmother   . Cancer Paternal Grandfather   . Hypertension Maternal Grandmother   . Diabetes Other   . Healthy Sister   . Healthy Sister   . Healthy Sister   . Healthy Sister     Social History Reviewed with no changes to be made today.   Outpatient Medications Prior to Visit  Medication Sig Dispense Refill  . acetaminophen (TYLENOL) 500 MG tablet Take 1,000 mg by mouth every 6 (six) hours as needed for moderate pain or headache.    . albuterol (PROVENTIL HFA;VENTOLIN HFA) 108 (90 BASE) MCG/ACT inhaler Inhale 2 puffs into the lungs every 6 (six) hours as needed for wheezing. Reported on 04/11/2016    . ferrous sulfate 325 (65 FE) MG tablet Take 325 mg by mouth daily.    . Misc. Devices MISC Please provide patient with an insurance approved CPAP.  Settings should be as follows: DME autopap 5-15.  Medium Cushion size Philips Respironics Minimal Contact  Dreamwear Under Nose Frame (M) mask and heated humidification. 1 each 0  . amLODipine (NORVASC) 5 MG tablet Take 1 tablet (5 mg total) by   mouth daily. Must have office visit for refills 30 tablet 0  . carvedilol (COREG) 12.5 MG tablet Take 1 tablet (12.5 mg total) by mouth 2 (two) times daily with a meal. 60 tablet 3  . hydrochlorothiazide (HYDRODIURIL) 25 MG tablet Take 1 tablet (25 mg total) by mouth daily. Must have office visit for refills 30 tablet 0  . nicotine (NICODERM CQ - DOSED IN MG/24 HOURS) 14 mg/24hr patch Place 1 patch (14 mg total) onto the skin daily. (Patient not taking: Reported on 09/05/2019) 42 patch 0   No facility-administered medications prior to visit.     No Known Allergies     Objective:    BP (!) 158/103 (BP Location: Left Arm, Patient Position: Sitting, Cuff Size: Large)   Pulse 91   Temp 99.1 F (37.3 C) (Oral)   Ht 5' 3" (1.6 m)   Wt 240 lb (108.9 kg)   LMP 08/07/2019   SpO2 98%   BMI 42.51 kg/m  Wt Readings from Last 3 Encounters:  09/05/19 240 lb (108.9 kg)   09/21/18 226 lb (102.5 kg)  08/24/18 230 lb (104.3 kg)    Physical Exam Vitals signs and nursing note reviewed.  Constitutional:      Appearance: She is well-developed.  HENT:     Head: Normocephalic and atraumatic.  Neck:     Musculoskeletal: Normal range of motion.  Cardiovascular:     Rate and Rhythm: Normal rate and regular rhythm.     Heart sounds: Normal heart sounds. No murmur. No friction rub. No gallop.   Pulmonary:     Effort: Pulmonary effort is normal. No tachypnea or respiratory distress.     Breath sounds: Normal breath sounds. No decreased breath sounds, wheezing, rhonchi or rales.  Chest:     Chest wall: No tenderness.  Abdominal:     General: Bowel sounds are normal.     Palpations: Abdomen is soft.  Musculoskeletal: Normal range of motion.  Skin:    General: Skin is warm and dry.  Neurological:     Mental Status: She is alert and oriented to person, place, and time.     Coordination: Coordination normal.  Psychiatric:        Behavior: Behavior normal. Behavior is cooperative.        Thought Content: Thought content normal.        Judgment: Judgment normal.        Patient has been counseled extensively about nutrition and exercise as well as the importance of adherence with medications and regular follow-up. The patient was given clear instructions to go to ER or return to medical center if symptoms don't improve, worsen or new problems develop. The patient verbalized understanding.   Follow-up: Return in about 2 weeks (around 09/19/2019) for BP recheck with LUKE .   Zelda W Fleming, FNP-BC [facility] [place], [place] [phone]   09/05/2019, 1:12 PM 

## 2019-09-06 LAB — CMP14+EGFR
ALT: 14 IU/L (ref 0–32)
AST: 16 IU/L (ref 0–40)
Albumin/Globulin Ratio: 1.6 (ref 1.2–2.2)
Albumin: 4.4 g/dL (ref 3.8–4.8)
Alkaline Phosphatase: 132 IU/L — ABNORMAL HIGH (ref 39–117)
BUN/Creatinine Ratio: 14 (ref 9–23)
BUN: 11 mg/dL (ref 6–20)
Bilirubin Total: 0.4 mg/dL (ref 0.0–1.2)
CO2: 23 mmol/L (ref 20–29)
Calcium: 9.5 mg/dL (ref 8.7–10.2)
Chloride: 104 mmol/L (ref 96–106)
Creatinine, Ser: 0.77 mg/dL (ref 0.57–1.00)
GFR calc Af Amer: 119 mL/min/{1.73_m2} (ref >59)
GFR calc non Af Amer: 103 mL/min/{1.73_m2} (ref >59)
Globulin, Total: 2.7 g/dL (ref 1.5–4.5)
Glucose: 108 mg/dL — ABNORMAL HIGH (ref 65–99)
Potassium: 4.6 mmol/L (ref 3.5–5.2)
Sodium: 140 mmol/L (ref 134–144)
Total Protein: 7.1 g/dL (ref 6.0–8.5)

## 2019-09-06 LAB — CBC
Hematocrit: 40 % (ref 34.0–46.6)
Hemoglobin: 13.6 g/dL (ref 11.1–15.9)
MCH: 30.9 pg (ref 26.6–33.0)
MCHC: 34 g/dL (ref 31.5–35.7)
MCV: 91 fL (ref 79–97)
Platelets: 289 10*3/uL (ref 150–450)
RBC: 4.4 x10E6/uL (ref 3.77–5.28)
RDW: 11.4 % — ABNORMAL LOW (ref 11.7–15.4)
WBC: 10.3 10*3/uL (ref 3.4–10.8)

## 2019-09-06 LAB — HEMOGLOBIN A1C
Est. average glucose Bld gHb Est-mCnc: 111 mg/dL
Hgb A1c MFr Bld: 5.5 % (ref 4.8–5.6)

## 2019-09-06 LAB — LIPID PANEL
Chol/HDL Ratio: 3.3 ratio (ref 0.0–4.4)
Cholesterol, Total: 138 mg/dL (ref 100–199)
HDL: 42 mg/dL (ref >39)
LDL Chol Calc (NIH): 84 mg/dL (ref 0–99)
Triglycerides: 54 mg/dL (ref 0–149)
VLDL Cholesterol Cal: 12 mg/dL (ref 5–40)

## 2019-09-21 ENCOUNTER — Other Ambulatory Visit: Payer: Self-pay

## 2019-09-21 ENCOUNTER — Ambulatory Visit: Payer: Medicaid Other | Attending: Family Medicine | Admitting: Pharmacist

## 2019-09-21 ENCOUNTER — Encounter: Payer: Self-pay | Admitting: Pharmacist

## 2019-09-21 VITALS — BP 110/74 | HR 80

## 2019-09-21 DIAGNOSIS — Z8249 Family history of ischemic heart disease and other diseases of the circulatory system: Secondary | ICD-10-CM | POA: Diagnosis not present

## 2019-09-21 DIAGNOSIS — F17200 Nicotine dependence, unspecified, uncomplicated: Secondary | ICD-10-CM | POA: Diagnosis not present

## 2019-09-21 DIAGNOSIS — Z823 Family history of stroke: Secondary | ICD-10-CM | POA: Diagnosis not present

## 2019-09-21 DIAGNOSIS — Z79899 Other long term (current) drug therapy: Secondary | ICD-10-CM | POA: Insufficient documentation

## 2019-09-21 DIAGNOSIS — I10 Essential (primary) hypertension: Secondary | ICD-10-CM | POA: Diagnosis not present

## 2019-09-21 NOTE — Progress Notes (Signed)
   S:    PCP: Zelda   Patient arrives in good spirits. Presents to the clinic for BP check.  Patient was referred and last seen by Primary Care Provider on 09/05/19.  Patient reports adherence with medications.  Patient denies chest pain or palpitations. Denies HA, lightheadedness or dizziness.   Current BP Medications include:  Amlodipine 5 mg daily, carvedilol 12.5 mg BID, HCTZ 25 mg daily.   Dietary habits include: compliant with salt restriction; limits caffeine  Exercise habits include: walking to and from work, doing yoga daily  Family / Social history:  - FHx: heart disease (father); HTN, stroke (mother, father) - Tobacco: still continues to smoke  - Alcohol: denies   O:   Home BP readings: not checking   Last 3 Office BP readings: BP Readings from Last 3 Encounters:  09/21/19 110/74  09/05/19 (!) 158/103  09/21/18 129/88   BMET    Component Value Date/Time   NA 140 09/05/2019 1126   K 4.6 09/05/2019 1126   CL 104 09/05/2019 1126   CO2 23 09/05/2019 1126   GLUCOSE 108 (H) 09/05/2019 1126   GLUCOSE 107 (H) 08/13/2018 1048   BUN 11 09/05/2019 1126   CREATININE 0.77 09/05/2019 1126   CREATININE 0.55 02/07/2016 1446   CALCIUM 9.5 09/05/2019 1126   GFRNONAA 103 09/05/2019 1126   GFRAA 119 09/05/2019 1126   Renal function: CrCl cannot be calculated (Unknown ideal weight.).  Clinical ASCVD: No  The ASCVD Risk score Mikey Bussing DC Jr., et al., 2013) failed to calculate for the following reasons:   The 2013 ASCVD risk score is only valid for ages 67 to 7  A/P: Hypertension longstanding currently controlled on current medications. BP Goal = <130/80 mmHg. Patient is adherent to medications.  -Continued current regimen.  -Counseled on lifestyle modifications for blood pressure control including reduced dietary sodium, increased exercise, adequate sleep  Results reviewed and written information provided.   Total time in face-to-face counseling 15 minutes.   F/U Clinic  Visit w/ PCP.    Patient seen with:  Lesle Reek, PharmD Candidate [facility] [employer] 2022  Benard Halsted, PharmD, [place] [phone]

## 2019-09-21 NOTE — Patient Instructions (Signed)

## 2020-02-09 ENCOUNTER — Other Ambulatory Visit: Payer: Self-pay | Admitting: Nurse Practitioner

## 2020-02-09 DIAGNOSIS — I10 Essential (primary) hypertension: Secondary | ICD-10-CM

## 2020-02-10 ENCOUNTER — Other Ambulatory Visit: Payer: Self-pay | Admitting: Nurse Practitioner

## 2020-02-10 DIAGNOSIS — I10 Essential (primary) hypertension: Secondary | ICD-10-CM

## 2021-05-24 ENCOUNTER — Emergency Department (HOSPITAL_COMMUNITY): Payer: Medicaid Other

## 2021-05-24 ENCOUNTER — Encounter (HOSPITAL_COMMUNITY): Payer: Self-pay | Admitting: Emergency Medicine

## 2021-05-24 ENCOUNTER — Encounter (HOSPITAL_COMMUNITY): Payer: Self-pay

## 2021-05-24 ENCOUNTER — Ambulatory Visit (HOSPITAL_COMMUNITY)
Admission: EM | Admit: 2021-05-24 | Discharge: 2021-05-24 | Disposition: A | Discharge status: Home / Self Care | Payer: Medicaid Other | Attending: Internal Medicine | Admitting: Internal Medicine

## 2021-05-24 ENCOUNTER — Emergency Department (HOSPITAL_COMMUNITY)
Admission: EM | Admit: 2021-05-24 | Discharge: 2021-05-24 | Disposition: A | Discharge status: Home / Self Care | Payer: Medicaid Other | Attending: Emergency Medicine | Admitting: Emergency Medicine

## 2021-05-24 ENCOUNTER — Other Ambulatory Visit: Payer: Self-pay

## 2021-05-24 DIAGNOSIS — F17210 Nicotine dependence, cigarettes, uncomplicated: Secondary | ICD-10-CM | POA: Insufficient documentation

## 2021-05-24 DIAGNOSIS — N9489 Other specified conditions associated with female genital organs and menstrual cycle: Secondary | ICD-10-CM | POA: Insufficient documentation

## 2021-05-24 DIAGNOSIS — I10 Essential (primary) hypertension: Secondary | ICD-10-CM | POA: Diagnosis not present

## 2021-05-24 DIAGNOSIS — Z79899 Other long term (current) drug therapy: Secondary | ICD-10-CM | POA: Diagnosis not present

## 2021-05-24 DIAGNOSIS — R112 Nausea with vomiting, unspecified: Secondary | ICD-10-CM

## 2021-05-24 DIAGNOSIS — K5792 Diverticulitis of intestine, part unspecified, without perforation or abscess without bleeding: Secondary | ICD-10-CM | POA: Diagnosis not present

## 2021-05-24 DIAGNOSIS — R1013 Epigastric pain: Secondary | ICD-10-CM | POA: Diagnosis present

## 2021-05-24 DIAGNOSIS — J45909 Unspecified asthma, uncomplicated: Secondary | ICD-10-CM | POA: Diagnosis not present

## 2021-05-24 LAB — POCT URINALYSIS DIPSTICK, ED / UC
Glucose, UA: NEGATIVE mg/dL
Leukocytes,Ua: NEGATIVE
Nitrite: NEGATIVE
Protein, ur: 30 mg/dL — AB
Specific Gravity, Urine: 1.025 (ref 1.005–1.030)
Urobilinogen, UA: 0.2 mg/dL (ref 0.0–1.0)
pH: 7 (ref 5.0–8.0)

## 2021-05-24 LAB — CBC WITH DIFFERENTIAL/PLATELET
Abs Immature Granulocytes: 0.09 10*3/uL — ABNORMAL HIGH (ref 0.00–0.07)
Basophils Absolute: 0.1 10*3/uL (ref 0.0–0.1)
Basophils Relative: 0 %
Eosinophils Absolute: 0 10*3/uL (ref 0.0–0.5)
Eosinophils Relative: 0 %
HCT: 42.2 % (ref 36.0–46.0)
Hemoglobin: 13.7 g/dL (ref 12.0–15.0)
Immature Granulocytes: 0 %
Lymphocytes Relative: 5 %
Lymphs Abs: 1.1 10*3/uL (ref 0.7–4.0)
MCH: 30.7 pg (ref 26.0–34.0)
MCHC: 32.5 g/dL (ref 30.0–36.0)
MCV: 94.6 fL (ref 80.0–100.0)
Monocytes Absolute: 0.6 10*3/uL (ref 0.1–1.0)
Monocytes Relative: 3 %
Neutro Abs: 19.5 10*3/uL — ABNORMAL HIGH (ref 1.7–7.7)
Neutrophils Relative %: 92 %
Platelets: 318 10*3/uL (ref 150–400)
RBC: 4.46 MIL/uL (ref 3.87–5.11)
RDW: 11.9 % (ref 11.5–15.5)
WBC: 21.3 10*3/uL — ABNORMAL HIGH (ref 4.0–10.5)
nRBC: 0 % (ref 0.0–0.2)

## 2021-05-24 LAB — COMPREHENSIVE METABOLIC PANEL
ALT: 13 U/L (ref 0–44)
AST: 16 U/L (ref 15–41)
Albumin: 3.7 g/dL (ref 3.5–5.0)
Alkaline Phosphatase: 108 U/L (ref 38–126)
Anion gap: 9 (ref 5–15)
BUN: 7 mg/dL (ref 6–20)
CO2: 24 mmol/L (ref 22–32)
Calcium: 9.2 mg/dL (ref 8.9–10.3)
Chloride: 101 mmol/L (ref 98–111)
Creatinine, Ser: 0.65 mg/dL (ref 0.44–1.00)
GFR, Estimated: >60 mL/min (ref >60)
Glucose, Bld: 103 mg/dL — ABNORMAL HIGH (ref 70–99)
Potassium: 4.1 mmol/L (ref 3.5–5.1)
Sodium: 134 mmol/L — ABNORMAL LOW (ref 135–145)
Total Bilirubin: 1 mg/dL (ref 0.3–1.2)
Total Protein: 6.9 g/dL (ref 6.5–8.1)

## 2021-05-24 LAB — URINALYSIS, ROUTINE W REFLEX MICROSCOPIC
Bilirubin Urine: NEGATIVE
Glucose, UA: NEGATIVE mg/dL
Ketones, ur: 5 mg/dL — AB
Leukocytes,Ua: NEGATIVE
Nitrite: NEGATIVE
Protein, ur: 30 mg/dL — AB
Specific Gravity, Urine: 1.017 (ref 1.005–1.030)
pH: 6 (ref 5.0–8.0)

## 2021-05-24 LAB — LIPASE, BLOOD: Lipase: 22 U/L (ref 11–51)

## 2021-05-24 LAB — I-STAT BETA HCG BLOOD, ED (MC, WL, AP ONLY): I-stat hCG, quantitative: <5 m[IU]/mL (ref <5)

## 2021-05-24 LAB — POC URINE PREG, ED: Preg Test, Ur: NEGATIVE

## 2021-05-24 MED ORDER — ACETAMINOPHEN 325 MG PO TABS
ORAL_TABLET | ORAL | Status: AC
  Filled 2021-05-24: qty 2

## 2021-05-24 MED ORDER — HYDROCODONE-ACETAMINOPHEN 5-325 MG PO TABS: 1.0000 | ORAL_TABLET | ORAL | 0 refills | Status: AC | PRN

## 2021-05-24 MED ORDER — ONDANSETRON HCL 4 MG/2ML IJ SOLN
4.0000 mg | Freq: Once | INTRAMUSCULAR | Status: AC
  Administered 2021-05-24: 4 mg via INTRAVENOUS
  Filled 2021-05-24: qty 2

## 2021-05-24 MED ORDER — ACETAMINOPHEN 325 MG PO TABS
650.0000 mg | ORAL_TABLET | Freq: Once | ORAL | Status: AC
  Administered 2021-05-24: 650 mg via ORAL

## 2021-05-24 MED ORDER — METRONIDAZOLE 500 MG PO TABS: 500.0000 mg | ORAL_TABLET | Freq: Three times a day (TID) | ORAL | 0 refills | Status: AC

## 2021-05-24 MED ORDER — SENNOSIDES-DOCUSATE SODIUM 8.6-50 MG PO TABS: 1.0000 | ORAL_TABLET | Freq: Every day | ORAL | 0 refills | Status: AC

## 2021-05-24 MED ORDER — ONDANSETRON 4 MG PO TBDP
4.0000 mg | ORAL_TABLET | Freq: Once | ORAL | Status: AC
  Administered 2021-05-24: 4 mg via ORAL

## 2021-05-24 MED ORDER — SODIUM CHLORIDE 0.9 % IV BOLUS
1000.0000 mL | Freq: Once | INTRAVENOUS | Status: AC
  Administered 2021-05-24: 1000 mL via INTRAVENOUS

## 2021-05-24 MED ORDER — CIPROFLOXACIN HCL 500 MG PO TABS: 500.0000 mg | ORAL_TABLET | Freq: Two times a day (BID) | ORAL | 0 refills | Status: AC

## 2021-05-24 MED ORDER — ONDANSETRON 4 MG PO TBDP
ORAL_TABLET | ORAL | Status: AC
  Filled 2021-05-24: qty 1

## 2021-05-24 MED ORDER — FENTANYL CITRATE (PF) 100 MCG/2ML IJ SOLN
50.0000 ug | Freq: Once | INTRAMUSCULAR | Status: AC
  Administered 2021-05-24: 50 ug via INTRAVENOUS
  Filled 2021-05-24: qty 2

## 2021-05-24 MED ORDER — IOHEXOL 300 MG/ML  SOLN
100.0000 mL | Freq: Once | INTRAMUSCULAR | Status: AC | PRN
  Administered 2021-05-24: 100 mL via INTRAVENOUS

## 2021-05-24 MED ORDER — ONDANSETRON HCL 4 MG PO TABS: 4.0000 mg | ORAL_TABLET | Freq: Four times a day (QID) | ORAL | 0 refills | Status: AC

## 2021-05-24 NOTE — ED Triage Notes (Addendum)
Pt reports unable to keep any food or water down since yesterday about 0800, nausea/vomiting. Pt also endorses upper and lower abdominal pain. Pt endorses some low back pain but states this is more chronic in nature. Denies urinary complaints.  Pt reports being out of her medications including BP medication since January due to not being able to get in to see her doctor.  LMP 05/12/21

## 2021-05-24 NOTE — Discharge Instructions (Addendum)
Go to the Emergency department for evaluation  

## 2021-05-24 NOTE — ED Provider Notes (Signed)
[facility] EMERGENCY DEPARTMENT Provider Note   CSN: [id number] Arrival date & time: 05/24/21  1359     History Chief Complaint  Patient presents with   Abdominal Pain    Shirley Terrell is a 35 y.o. female.  35 y.o female with a past medical history of epigastric hernia, high blood pressure, asthma presents to the ED with a chief complaint of abdominal pain which began yesterday.  She describes this pain as sharp and stabbing nature along the periumbilical area with radiation through her whole abdomen.  Pain is exacerbated with eating, no alleviating factors.  She feels that pain has been constant and ongoing.  Her last meal was around 8 AM yesterday, states that every time she tries to have some food, she has had multiple episodes of nonbilious, nonbloody emesis.  She does have a prior history of hernia repair and cholecystectomy.  She has not tried taking any medications for improvement in her symptoms.  She denies any urinary symptoms, chest pain, shortness of breath, vaginal bleeding or discharge.  Last bowel movement was yesterday, without any blood in her stool.     The history is provided by the patient and medical records.  Abdominal Pain Pain location:  Generalized Pain quality: sharp and stabbing   Pain radiates to:  Does not radiate Pain severity:  Moderate Onset quality:  Sudden Duration:  1 day Timing:  Constant Progression:  Worsening Chronicity:  New Context: not alcohol use, not laxative use, not previous surgeries, not recent travel and not suspicious food intake   Relieved by:  Nothing Worsened by:  Eating Associated symptoms: nausea and vomiting   Associated symptoms: no chest pain, no chills, no constipation, no fever, no hematemesis, no hematochezia, no shortness of breath, no sore throat, no vaginal bleeding and no vaginal discharge       Past Medical History:  Diagnosis Date   Anemia    takes iron supplement   Asthma    prn  inhaler   Hypertension    states BP normalized while pregnant and since delivery; is currently not on med.   Lisfranc dislocation 11/30/2016   left   Sleep apnea    uses cpap    Patient Active Problem List   Diagnosis Date Noted   Epigastric hernia 08/13/2018   Obesity, Class III, BMI 40-49.9 (morbid obesity) (HCC) 04/04/2018   Obstructive sleep apnea syndrome, mild 04/04/2018   Tobacco abuse 04/04/2018   Essential hypertension 12/04/2017   Morbid obesity with BMI of 40.0-44.9, adult (HCC) 12/04/2017    Past Surgical History:  Procedure Laterality Date   CHOLECYSTECTOMY  04/13/2012   Procedure: LAPAROSCOPIC CHOLECYSTECTOMY WITH INTRAOPERATIVE CHOLANGIOGRAM;  Surgeon: Adolph Pollack, MD;  Location: [facility] OR;  Service: General;  Laterality: N/A;  laparoscopic cholecystectomy with IOC   INCISIONAL HERNIA REPAIR  08/13/2018   LAPROSCOPIC   INCISIONAL HERNIA REPAIR N/A 08/13/2018   Procedure: LAPAROSCOPIC INCISIONAL HERNIA REPAIR;  Surgeon: Berna Bue, MD;  Location: [facility] OR;  Service: General;  Laterality: N/A;   INSERTION OF MESH N/A 08/13/2018   Procedure: INSERTION OF MESH;  Surgeon: Berna Bue, MD;  Location: [facility] OR;  Service: General;  Laterality: N/A;   OPEN REDUCTION INTERNAL FIXATION (ORIF) FOOT LISFRANC FRACTURE Left 12/18/2016   Procedure: OPEN REDUCTION INTERNAL FIXATION (ORIF) FOOT LISFRANC FRACTURE;  Surgeon: Toni Arthurs, MD;  Location: [facility];  Service: Orthopedics;  Laterality: Left;     OB History  Gravida  2   Para  2   Term  2   Preterm      AB      Living  2      SAB      IAB      Ectopic      Multiple  0   Live Births  2           Family History  Problem Relation Age of Onset   Hypertension Mother    Stroke Mother    Renal Disease Mother        due to blood pressure   Heart disease Father        MI, no stents   Depression Father    Hypertension Father    Stroke Father    Cancer Paternal Grandmother         breast   Diabetes Paternal Grandmother    Cancer Paternal Grandfather    Hypertension Maternal Grandmother    Diabetes Other    Healthy Sister    Healthy Sister    Healthy Sister    Healthy Sister     Social History   Tobacco Use   Smoking status: Some Days    Packs/day: 0.00    Years: 3.00    Pack years: 0.00    Types: Cigarettes    Last attempt to quit: 12/14/2017    Years since quitting: 3.4   Smokeless tobacco: Never   Tobacco comments:    6 cigarettes/day  Vaping Use   Vaping Use: Never used  Substance Use Topics   Alcohol use: Yes    Alcohol/week: 0.0 standard drinks    Comment: occasionally   Drug use: No    Home Medications Prior to Admission medications   Medication Sig Start Date End Date Taking? Authorizing Provider  albuterol (PROVENTIL HFA;VENTOLIN HFA) 108 (90 BASE) MCG/ACT inhaler Inhale 2 puffs into the lungs every 6 (six) hours as needed for wheezing. Reported on 04/11/2016   Yes [provider]  amLODipine (NORVASC) 5 MG tablet TAKE 1 TABLET (5 MG TOTAL) BY MOUTH DAILY. MUST HAVE OFFICE VISIT FOR REFILLS 02/10/20 05/24/21 Yes Claiborne Rigg, NP  Blood Pressure Monitor DEVI Please provide patient with insurance approved blood pressure monitor 09/05/19  Yes Claiborne Rigg, NP  carvedilol (COREG) 12.5 MG tablet Take 1 tablet (12.5 mg total) by mouth 2 (two) times daily with a meal. 09/05/19  Yes Claiborne Rigg, NP  ciprofloxacin (CIPRO) 500 MG tablet Take 1 tablet (500 mg total) by mouth 2 (two) times daily for 7 days. 05/24/21 05/31/21 Yes Ciria Bernardini, Leonie Douglas, PA-C  ferrous sulfate 325 (65 FE) MG tablet Take 325 mg by mouth daily.   Yes [provider]  hydrochlorothiazide (HYDRODIURIL) 25 MG tablet TAKE 1 TABLET (25 MG TOTAL) BY MOUTH DAILY. MUST HAVE OFFICE VISIT FOR REFILLS 02/10/20 05/24/21 Yes Hoy Register, MD  HYDROcodone-acetaminophen (NORCO/VICODIN) 5-325 MG tablet Take 1 tablet by mouth every 4 (four) hours as needed for up to 3  days. 05/24/21 05/27/21 Yes Jaquana Geiger, PA-C  metroNIDAZOLE (FLAGYL) 500 MG tablet Take 1 tablet (500 mg total) by mouth 3 (three) times daily for 7 days. 05/24/21 05/31/21 Yes Claude Manges, PA-C  Misc. Devices MISC Please provide patient with an insurance approved CPAP.  Settings should be as follows: DME autopap 5-15.  Medium Cushion size [employer]ics Minimal Data processing manager Under Nose Frame (M) mask and heated humidification. 07/15/18  Yes Claiborne Rigg, NP  ondansetron (ZOFRAN) 4 MG tablet Take 1 tablet (4 mg total) by mouth every 6 (six) hours for 7 days. 05/24/21 05/31/21 Yes Talmadge Ganas, PA-C  senna-docusate (SENOKOT-S) 8.6-50 MG tablet Take 1 tablet by mouth daily for 7 days. 05/24/21 05/31/21 Yes Samuella Rasool, PA-C  nicotine (NICODERM CQ - DOSED IN MG/24 HOURS) 14 mg/24hr patch Place 1 patch (14 mg total) onto the skin daily. Patient not taking: No sig reported 03/22/18   Claiborne RiggFleming, Zelda W, NP    Allergies    Patient has no known allergies.  Review of Systems   Review of Systems  Constitutional:  Negative for chills and fever.  HENT:  Negative for sore throat.   Respiratory:  Negative for shortness of breath.   Cardiovascular:  Negative for chest pain.  Gastrointestinal:  Positive for abdominal pain, nausea and vomiting. Negative for constipation, hematemesis and hematochezia.  Genitourinary:  Negative for flank pain, vaginal bleeding and vaginal discharge.  Musculoskeletal:  Negative for back pain.  Skin:  Negative for pallor and wound.  Neurological:  Negative for light-headedness and headaches.  All other systems reviewed and are negative.  Physical Exam Updated Vital Signs BP (!) 172/108   Pulse 81   Temp 98.3 F (36.8 C) (Oral)   Resp (!) 23   LMP 05/08/2021   SpO2 100%   Physical Exam Vitals and nursing note reviewed.  Constitutional:      Appearance: She is well-developed.  HENT:     Head: Normocephalic and atraumatic.  Cardiovascular:     Rate and  Rhythm: Normal rate.  Pulmonary:     Effort: Pulmonary effort is normal.     Breath sounds: No wheezing or rales.  Abdominal:     General: Abdomen is flat. Bowel sounds are decreased.     Palpations: Abdomen is soft.     Tenderness: There is abdominal tenderness in the epigastric area and periumbilical area. There is no right CVA tenderness or left CVA tenderness.  Skin:    General: Skin is warm and dry.  Neurological:     Mental Status: She is alert and oriented to person, place, and time.    ED Results / Procedures / Treatments   Labs (all labs ordered are listed, but only abnormal results are displayed) Labs Reviewed  CBC WITH DIFFERENTIAL/PLATELET - Abnormal; Notable for the following components:      Result Value   WBC 21.3 (*)    Neutro Abs 19.5 (*)    Abs Immature Granulocytes 0.09 (*)    All other components within normal limits  COMPREHENSIVE METABOLIC PANEL - Abnormal; Notable for the following components:   Sodium 134 (*)    Glucose, Bld 103 (*)    All other components within normal limits  URINALYSIS, ROUTINE W REFLEX MICROSCOPIC - Abnormal; Notable for the following components:   APPearance HAZY (*)    Hgb urine dipstick MODERATE (*)    Ketones, ur 5 (*)    Protein, ur 30 (*)    Bacteria, UA RARE (*)    All other components within normal limits  LIPASE, BLOOD  I-STAT BETA HCG BLOOD, ED ([facility], [facility], [facility] ONLY)    EKG None  Radiology CT ABDOMEN PELVIS W CONTRAST  Result Date: 05/24/2021 CLINICAL DATA:  Abdominal pain, acute, nonlocalized EXAM: CT ABDOMEN AND PELVIS WITH CONTRAST TECHNIQUE: Multidetector CT imaging of the abdomen and pelvis was performed using the standard protocol following bolus administration of intravenous contrast. CONTRAST:  100mL OMNIPAQUE IOHEXOL 300 MG/ML  SOLN COMPARISON:  Abdominal ultrasound 03/09/2012 FINDINGS: Lower chest: Cardiac size top normal. No pericardial effusion. Some dependent atelectatic changes in the otherwise clear lung  bases. Hepatobiliary: No worrisome focal liver lesions. Smooth liver surface contour. Normal hepatic attenuation. Prior cholecystectomy. No significant biliary ductal dilatation or intraductal gallstones. Pancreas: No pancreatic ductal dilatation or surrounding inflammatory changes. Spleen: Normal in size. No concerning splenic lesions. Adrenals/Urinary Tract: Normal adrenals. Kidneys are normally located with symmetric enhancement and excretion. No suspicious renal lesion, urolithiasis or hydronephrosis. Urinary bladder is unremarkable for the degree of distention. Stomach/Bowel: Distal esophagus, stomach and duodenal sweep are unremarkable. No small bowel wall thickening or dilatation. No evidence of obstruction. Normal appendix in the right lower quadrant. Pancolonic diverticulosis. A more focally thickened segment is seen at the level of the splenic flexure extending through the proximal descending colon with inflammation centered upon a culprit diverticulum in the left upper quadrant (6/42). No extraluminal gas, organized collection or abscess is seen. Some surrounding adjacent phlegmonous change and likely reactive trace fluid in the left pericolic gutter superiorly.: Returns to a more normal appearance by the level of the sigmoid. No other sites of focal inflammation or focally inflamed diverticula. Vascular/Lymphatic: No significant vascular findings are present. No pathologically enlarged abdominal or pelvic lymph nodes. Reactive adenopathy seen in the upper abdomen. Reproductive: Normal anteverted uterus. Few simple appearing follicles in the ovaries. No concerning adnexal masses. Other: No abdominopelvic free fluid or free gas. Phlegmonous changes in the left upper quadrant, as detailed above. Ventral rectus diastasis with evidence of prior ventral hernia repair. No frank bowel containing hernia is seen at this time. Musculoskeletal: Rectus sheath diastasis, as above. Musculature is otherwise normal and  symmetric. No acute osseous abnormality or suspicious osseous lesion. IMPRESSION: 1. Pancolonic diverticulosis with features compatible with an acute uncomplicated diverticulitis centered upon a focal culprit diverticulum towards the splenic flexure. No extraluminal gas, organized collection or abscess is seen at this time. 2. Ventral rectus diastasis with evidence of prior ventral hernia repair. 3. Prior cholecystectomy. Electronically Signed   By: Kreg Shropshire M.D.   On: 05/24/2021 19:35    Procedures Procedures   Medications Ordered in ED Medications  sodium chloride 0.9 % bolus 1,000 mL (0 mLs Intravenous Stopped 05/24/21 2045)  fentaNYL (SUBLIMAZE) injection 50 mcg (50 mcg Intravenous Given 05/24/21 1852)  ondansetron (ZOFRAN) injection 4 mg (4 mg Intravenous Given 05/24/21 1852)  iohexol (OMNIPAQUE) 300 MG/ML solution 100 mL (100 mLs Intravenous Contrast Given 05/24/21 1920)    ED Course  I have reviewed the triage vital signs and the nursing notes.  Pertinent labs & imaging results that were available during my care of the patient were reviewed by me and considered in my medical decision making (see chart for details).    MDM Rules/Calculators/A&P    Patient with a past medical history of surgical interventions to her abdomen such as hernia repair, cholecystectomy presents to the ED with sudden onset of sharp stabbing generalized periumbilical pain since yesterday.  Multiple episodes of nonbilious, nonbloody emesis.  Has been able to tolerate any p.o. due to persistent episodes of emesis.  She reports the pain is severe in nature stabbing.  No alleviating factors.  She arrived in the ED with a slight elevated heart rate at 98 but afebrile.  During evaluation patient is resting comfortably, vitals remarkable for hypertension, does have a history of this but reports compliance with medication.  Lungs are clear to auscultation.  There is tenderness to palpation  along the periumbilical and  epigastric area, bowel sounds are diminished throughout.  Abdomen is otherwise soft, without distention.  Bilateral legs without any swelling or pitting edema.  Lungs without any decrease sounds.  Interpretation of her blood work remarkable for a leukocytosis of 21,000, hemoglobin is within her normal limits.  CMP with slight decrease in her sodium but within normal limits.  No other electrolyte derangement.  Creatinine levels within normal limits.  LFTs are unremarkable.  Lipase level is negative.  Beta hCG is also negative.  We discussed symptomatic treatment along with CT abdomen and pelvis to further evaluate for any intra-abdominal pathology.  CT Abdomen and pelvis showed:  1. Pancolonic diverticulosis with features compatible with an acute  uncomplicated diverticulitis centered upon a focal culprit  diverticulum towards the splenic flexure. No extraluminal gas,  organized collection or abscess is seen at this time.  2. Ventral rectus diastasis with evidence of prior ventral hernia  repair.  3. Prior cholecystectomy.      These results were discussed with patient at length, she was provided with a copy of her CT abdomen.  She is currently tolerating p.o. without any further episodes of vomiting.  We discussed going home on dual antibiotic therapy along with pain control and stool softeners and nausea medication.  She is agreeable to plan at this time.  She is not septic, afebrile, although white count is elevated, she is adequately tolerating p.o. intake we can trial outpatient therapy at this time.  She is agreeable of this at this time.  Patient is in the greater management, return precautions discussed at length.  Patient stable for discharge.  Portions of this note were generated with Scientist, clinical (histocompatibility and immunogenetics). Dictation errors may occur despite best attempts at proofreading.  Final Clinical Impression(s) / ED Diagnoses Final diagnoses:  Diverticulitis    Rx / DC Orders ED  Discharge Orders          Ordered    metroNIDAZOLE (FLAGYL) 500 MG tablet  3 times daily        05/24/21 2100    ciprofloxacin (CIPRO) 500 MG tablet  2 times daily        05/24/21 2100    HYDROcodone-acetaminophen (NORCO/VICODIN) 5-325 MG tablet  Every 4 hours PRN        05/24/21 2100    ondansetron (ZOFRAN) 4 MG tablet  Every 6 hours        05/24/21 2100    senna-docusate (SENOKOT-S) 8.6-50 MG tablet  Daily        05/24/21 2100             Claude Manges, PA-C 05/24/21 2101    Tegeler, Canary Brim, MD 05/25/21 [id number]

## 2021-05-24 NOTE — ED Triage Notes (Signed)
Patient complains of abdominal pain, nausea, and vomiting that started yesterday morning. Patient was seen at Urgent Care earlier today and given zofran which helped with nausea but abdominal pain continues. Denies diarrhea, shortness of breath. Patient alert, oriented, and in no apparent distress at this time.

## 2021-05-24 NOTE — ED Provider Notes (Signed)
[facility]    CSN: [id number] Arrival date & time: 05/24/21  5176      History   Chief Complaint Chief Complaint  Patient presents with   Abdominal Pain   Emesis    HPI Shirley Terrell is a 35 y.o. female.   The history is provided by the patient. No language interpreter was used.  Abdominal Pain Pain location:  Generalized Pain quality: aching   Pain radiates to:  Does not radiate Pain severity:  Moderate Onset quality:  Gradual Timing:  Constant Progression:  Worsening Chronicity:  New Relieved by:  Nothing Worsened by:  Nothing Ineffective treatments:  None tried Associated symptoms: vomiting   Emesis Recent urination:  Normal Relieved by:  Nothing Associated symptoms: abdominal pain   Pt complains of vomiting and abdominal pain full abdomen  Past Medical History:  Diagnosis Date   Anemia    takes iron supplement   Asthma    prn inhaler   Hypertension    states BP normalized while pregnant and since delivery; is currently not on med.   Lisfranc dislocation 11/30/2016   left   Sleep apnea    uses cpap    Patient Active Problem List   Diagnosis Date Noted   Epigastric hernia 08/13/2018   Obesity, Class III, BMI 40-49.9 (morbid obesity) (HCC) 04/04/2018   Obstructive sleep apnea syndrome, mild 04/04/2018   Tobacco abuse 04/04/2018   Essential hypertension 12/04/2017   Morbid obesity with BMI of 40.0-44.9, adult (HCC) 12/04/2017    Past Surgical History:  Procedure Laterality Date   CHOLECYSTECTOMY  04/13/2012   Procedure: LAPAROSCOPIC CHOLECYSTECTOMY WITH INTRAOPERATIVE CHOLANGIOGRAM;  Surgeon: Adolph Pollack, MD;  Location: [facility] OR;  Service: General;  Laterality: N/A;  laparoscopic cholecystectomy with IOC   INCISIONAL HERNIA REPAIR  08/13/2018   LAPROSCOPIC   INCISIONAL HERNIA REPAIR N/A 08/13/2018   Procedure: LAPAROSCOPIC INCISIONAL HERNIA REPAIR;  Surgeon: Berna Bue, MD;  Location: [facility];  Service: General;  Laterality:  N/A;   INSERTION OF MESH N/A 08/13/2018   Procedure: INSERTION OF MESH;  Surgeon: Berna Bue, MD;  Location: [facility] OR;  Service: General;  Laterality: N/A;   OPEN REDUCTION INTERNAL FIXATION (ORIF) FOOT LISFRANC FRACTURE Left 12/18/2016   Procedure: OPEN REDUCTION INTERNAL FIXATION (ORIF) FOOT LISFRANC FRACTURE;  Surgeon: Toni Arthurs, MD;  Location: [facility];  Service: Orthopedics;  Laterality: Left;    OB History     Gravida  2   Para  2   Term  2   Preterm      AB      Living  2      SAB      IAB      Ectopic      Multiple  0   Live Births  2            Home Medications    Prior to Admission medications   Medication Sig Start Date End Date Taking? Authorizing Provider  acetaminophen (TYLENOL) 500 MG tablet Take 1,000 mg by mouth every 6 (six) hours as needed for moderate pain or headache.   Yes [provider]  albuterol (PROVENTIL HFA;VENTOLIN HFA) 108 (90 BASE) MCG/ACT inhaler Inhale 2 puffs into the lungs every 6 (six) hours as needed for wheezing. Reported on 04/11/2016   Yes [provider]  ferrous sulfate 325 (65 FE) MG tablet Take 325 mg by mouth daily.   Yes [provider]  amLODipine (NORVASC) 5  MG tablet TAKE 1 TABLET (5 MG TOTAL) BY MOUTH DAILY. MUST HAVE OFFICE VISIT FOR REFILLS 02/10/20 05/10/20  Claiborne Rigg, NP  Blood Pressure Monitor DEVI Please provide patient with insurance approved blood pressure monitor 09/05/19   Claiborne Rigg, NP  carvedilol (COREG) 12.5 MG tablet Take 1 tablet (12.5 mg total) by mouth 2 (two) times daily with a meal. 09/05/19   Claiborne Rigg, NP  hydrochlorothiazide (HYDRODIURIL) 25 MG tablet TAKE 1 TABLET (25 MG TOTAL) BY MOUTH DAILY. MUST HAVE OFFICE VISIT FOR REFILLS 02/10/20 05/10/20  Hoy Register, MD  Misc. Devices MISC Please provide patient with an insurance approved CPAP.  Settings should be as follows: DME autopap 5-15.  Medium Cushion size Philips Respironics  Minimal Data processing manager Under Nose Frame (M) mask and heated humidification. 07/15/18   Claiborne Rigg, NP  nicotine (NICODERM CQ - DOSED IN MG/24 HOURS) 14 mg/24hr patch Place 1 patch (14 mg total) onto the skin daily. Patient not taking: Reported on 09/05/2019 03/22/18   Claiborne Rigg, NP    Family History Family History  Problem Relation Age of Onset   Hypertension Mother    Stroke Mother    Renal Disease Mother        due to blood pressure   Heart disease Father        MI, no stents   Depression Father    Hypertension Father    Stroke Father    Cancer Paternal Grandmother        breast   Diabetes Paternal Grandmother    Cancer Paternal Grandfather    Hypertension Maternal Grandmother    Diabetes Other    Healthy Sister    Healthy Sister    Healthy Sister    Healthy Sister     Social History Social History   Tobacco Use   Smoking status: Some Days    Packs/day: 0.00    Years: 3.00    Pack years: 0.00    Types: Cigarettes    Last attempt to quit: 12/14/2017    Years since quitting: 3.4   Smokeless tobacco: Never   Tobacco comments:    6 cigarettes/day  Vaping Use   Vaping Use: Never used  Substance Use Topics   Alcohol use: Yes    Alcohol/week: 0.0 standard drinks    Comment: occasionally   Drug use: No     Allergies   Patient has no known allergies.   Review of Systems Review of Systems  Gastrointestinal:  Positive for abdominal pain and vomiting.  All other systems reviewed and are negative.   Physical Exam Triage Vital Signs ED Triage Vitals  Enc Vitals Group     BP 05/24/21 1058 (!) 162/115     Pulse Rate 05/24/21 1058 92     Resp 05/24/21 1058 18     Temp 05/24/21 1058 98.6 F (37 C)     Temp Source 05/24/21 1058 Oral     SpO2 05/24/21 1058 100 %     Weight --      Height --      Head Circumference --      Peak Flow --      Pain Score 05/24/21 1055 7     Pain Loc --      Pain Edu? --      Excl. in GC? --    No data  found.  Updated Vital Signs BP (!) 162/115 (BP Location: Right Arm)   Pulse 92  Temp 98.6 F (37 C) (Oral)   Resp 18   LMP 05/08/2021   SpO2 100%   Visual Acuity Right Eye Distance:   Left Eye Distance:   Bilateral Distance:    Right Eye Near:   Left Eye Near:    Bilateral Near:     Physical Exam Vitals and nursing note reviewed.  Constitutional:      General: She is not in acute distress.    Appearance: She is well-developed.  HENT:     Head: Normocephalic and atraumatic.  Eyes:     Conjunctiva/sclera: Conjunctivae normal.  Cardiovascular:     Rate and Rhythm: Normal rate and regular rhythm.     Heart sounds: No murmur heard. Pulmonary:     Effort: Pulmonary effort is normal. No respiratory distress.     Breath sounds: Normal breath sounds.  Abdominal:     General: Abdomen is flat. Bowel sounds are normal.     Palpations: Abdomen is soft.     Tenderness: There is abdominal tenderness.  Musculoskeletal:     Cervical back: Neck supple.  Skin:    General: Skin is warm and dry.  Neurological:     General: No focal deficit present.     Mental Status: She is alert.  Psychiatric:        Mood and Affect: Mood normal.     UC Treatments / Results  Labs (all labs ordered are listed, but only abnormal results are displayed) Labs Reviewed  POCT URINALYSIS DIPSTICK, ED / UC - Abnormal; Notable for the following components:      Result Value   Bilirubin Urine SMALL (*)    Ketones, ur TRACE (*)    Hgb urine dipstick MODERATE (*)    Protein, ur 30 (*)    All other components within normal limits  POC URINE PREG, ED    EKG   Radiology No results found.  Procedures Procedures (including critical care time)  Medications Ordered in UC Medications  ondansetron (ZOFRAN-ODT) disintegrating tablet 4 mg (4 mg Oral Given 05/24/21 1137)  acetaminophen (TYLENOL) tablet 650 mg (650 mg Oral Given 05/24/21 1241)    Initial Impression / Assessment and Plan / UC Course  I  have reviewed the triage vital signs and the nursing notes.  Pertinent labs & imaging results that were available during my care of the patient were reviewed by me and considered in my medical decision making (see chart for details).     MDM:  Pt given odt zofran.  Pt reports nausea and vomiting resolved.  Pt given tylenol for pain.  Pt reports continued abdominal pain   Pt advised to go to ED for evaluation of pain  Final Clinical Impressions(s) / UC Diagnoses   Final diagnoses:  Non-intractable vomiting with nausea, unspecified vomiting type   Discharge Instructions   None    ED Prescriptions   None    PDMP not reviewed this encounter.   Elson Areas, [place] 05/24/21 1336

## 2021-05-24 NOTE — Discharge Instructions (Addendum)
We discussed that the shortness of your CT scan at length.  You were provided with a copy of this for your records.  I have prescribed 4 different medications for you.  The antibiotics Flagyl along with ciprofloxacin are to help treat your diverticulitis.  Norco, this narcotic medication is to be used for severe pain.  Zofran was also prescribed to you, this is to help with any nausea.  I have also prescribed senna, this is to help with any constipation caused by Norco.   If you experience any fever, worsening pain, worsening symptoms you will need to return to the emergency department.

## 2021-05-24 NOTE — ED Provider Notes (Signed)
Emergency Medicine Provider Triage Evaluation Note  Shirley Terrell , a 35 y.o. female  was evaluated in triage.  Pt complains of some generalized/central low back pain.  She states her abdominal pain began yesterday morning and she began to feel nauseous.  She states over the course of that she had 5 or 6 episodes of nonbloody nonbilious emesis.  Denies any diarrhea.  Denies any fevers or chills.  No chest pain or shortness of breath.  Was seen in urgent care today given Zofran which improved her nausea and vomiting patient states she continues abdominal pain.  Surgical history notable for cholecystectomy and abdominal hernia  Review of Systems  Positive: Nausea vomiting abdominal pain Negative: Fever  Physical Exam  BP (!) 159/116 (BP Location: Right Arm)   Pulse 98   Temp 98.3 F (36.8 C) (Oral)   Resp 20   LMP 05/08/2021   SpO2 98%  Gen:   Awake, no distress   Resp:  Normal effort  MSK:   Moves extremities without difficulty  Other:  Abdomen is soft.  No focal tenderness but there is some diffuse central abdominal tenderness.  No guarding or rebound.  Medical Decision Making  Medically screening exam initiated at 3:28 PM.  Appropriate orders placed.  SHYANA KULAKOWSKI was informed that the remainder of the evaluation will be completed by another provider, this initial triage assessment does not replace that evaluation, and the importance of remaining in the ED until their evaluation is complete.  Patient is 35 year old with abdominal pain. Overall reassuring evaluation.  Does have some generalized abdominal discomfort and discomfort with palpation central abdomen.  This may be related to his hernia.  She is not having any obstructive symptoms  She was counseled to wait in ER until she is evaluated in room.  She is agreeable to this plan.   Gailen Shelter, [place] 05/24/21 1532    Wynetta Fines, MD 05/26/21 2055

## 2021-05-24 NOTE — ED Notes (Signed)
Patient transported to CT 

## 2021-05-27 ENCOUNTER — Other Ambulatory Visit: Payer: Self-pay | Admitting: Nurse Practitioner

## 2021-05-27 DIAGNOSIS — I10 Essential (primary) hypertension: Secondary | ICD-10-CM

## 2021-12-05 ENCOUNTER — Encounter: Payer: Self-pay | Admitting: Nurse Practitioner

## 2021-12-05 ENCOUNTER — Ambulatory Visit: Payer: Medicaid Other | Admitting: Physician Assistant

## 2021-12-05 ENCOUNTER — Other Ambulatory Visit: Payer: Self-pay

## 2021-12-05 ENCOUNTER — Other Ambulatory Visit: Payer: Self-pay | Admitting: Nurse Practitioner

## 2021-12-05 ENCOUNTER — Ambulatory Visit: Payer: Self-pay | Admitting: *Deleted

## 2021-12-05 VITALS — BP 162/102 | HR 93 | Temp 98.2°F | Resp 18 | Ht 63.0 in | Wt 207.0 lb

## 2021-12-05 DIAGNOSIS — Z13220 Encounter for screening for lipoid disorders: Secondary | ICD-10-CM | POA: Insufficient documentation

## 2021-12-05 DIAGNOSIS — Z6836 Body mass index (BMI) 36.0-36.9, adult: Secondary | ICD-10-CM

## 2021-12-05 DIAGNOSIS — Z302 Encounter for sterilization: Secondary | ICD-10-CM | POA: Insufficient documentation

## 2021-12-05 DIAGNOSIS — I10 Essential (primary) hypertension: Secondary | ICD-10-CM

## 2021-12-05 DIAGNOSIS — D649 Anemia, unspecified: Secondary | ICD-10-CM | POA: Diagnosis not present

## 2021-12-05 DIAGNOSIS — F17200 Nicotine dependence, unspecified, uncomplicated: Secondary | ICD-10-CM | POA: Diagnosis not present

## 2021-12-05 DIAGNOSIS — Z720 Tobacco use: Secondary | ICD-10-CM

## 2021-12-05 DIAGNOSIS — E6609 Other obesity due to excess calories: Secondary | ICD-10-CM

## 2021-12-05 MED ORDER — HYDROCHLOROTHIAZIDE 25 MG PO TABS: 25.0000 mg | ORAL_TABLET | Freq: Every day | ORAL | 1 refills | Status: DC

## 2021-12-05 NOTE — Patient Instructions (Signed)
You are going to restart hydrochlorothiazide 25 mg once daily.  I do encourage you to make sure that you are drinking at least 64 to 80 ounces of water a day.  Please check your blood pressure on a daily basis, keep a written log and have available for [facility].  Please present to [facility] to have your fasting labs completed.  We will call you with the lab results when they become available.  I do encourage you to work on reducing your sodium intake, you can use a tracking app such as my fitness pal to monitor your sodium.  Roney Jaffe, PA-C Physician Assistant [facility] [email or web address]   Low-Sodium Eating Plan Sodium, which is an element that makes up salt, helps you maintain a healthy balance of fluids in your body. Too much sodium can increase your blood pressure and cause fluid and waste to be held in your body. Your health care provider or dietitian may recommend following this plan if you have high blood pressure (hypertension), kidney disease, liver disease, or heart failure. Eating less sodium can help lower your blood pressure, reduce swelling, and protect your heart, liver, and kidneys. What are tips for following this plan? Reading food labels The Nutrition Facts label lists the amount of sodium in one serving of the food. If you eat more than one serving, you must multiply the listed amount of sodium by the number of servings. Choose foods with less than 140 mg of sodium per serving. Avoid foods with 300 mg of sodium or more per serving. Shopping  Look for lower-sodium products, often labeled as "low-sodium" or "no salt added." Always check the sodium content, even if foods are labeled as "unsalted" or "no salt added." Buy fresh foods. Avoid canned foods and pre-made or frozen meals. Avoid canned, cured, or processed meats. Buy breads that have less than 80 mg  of sodium per slice. Cooking  Eat more home-cooked food and less restaurant, buffet, and fast food. Avoid adding salt when cooking. Use salt-free seasonings or herbs instead of table salt or sea salt. Check with your health care provider or pharmacist before using salt substitutes. Cook with plant-based oils, such as canola, sunflower, or olive oil. Meal planning When eating at a restaurant, ask that your food be prepared with less salt or no salt, if possible. Avoid dishes labeled as brined, pickled, cured, smoked, or made with soy sauce, miso, or teriyaki sauce. Avoid foods that contain MSG (monosodium glutamate). MSG is sometimes added to [place] food, bouillon, and some canned foods. Make meals that can be grilled, baked, poached, roasted, or steamed. These are generally made with less sodium. General information Most people on this plan should limit their sodium intake to 1,500-2,000 mg (milligrams) of sodium each day. What foods should I eat? Fruits Fresh, frozen, or canned fruit. Fruit juice. Vegetables Fresh or frozen vegetables. "No salt added" canned vegetables. "No salt added" tomato sauce and paste. Low-sodium or reduced-sodium tomato and vegetable juice. Grains Low-sodium cereals, including oats, puffed wheat and rice, and shredded wheat. Low-sodium crackers. Unsalted rice. Unsalted pasta. Low-sodium bread. Whole-grain breads and whole-grain pasta. Meats and other proteins Fresh or frozen (no salt added) meat, poultry, seafood, and fish. Low-sodium canned tuna and salmon. Unsalted nuts. Dried peas, beans, and lentils without added salt. Unsalted canned beans. Eggs. Unsalted nut butters. Dairy Milk. Soy milk. Cheese that is naturally low in sodium, such as ricotta cheese, fresh mozzarella,  or Swiss cheese. Low-sodium or reduced-sodium cheese. Cream cheese. Yogurt. Seasonings and condiments Fresh and dried herbs and spices. Salt-free seasonings. Low-sodium mustard and ketchup.  Sodium-free salad dressing. Sodium-free light mayonnaise. Fresh or refrigerated horseradish. Lemon juice. Vinegar. Other foods Homemade, reduced-sodium, or low-sodium soups. Unsalted popcorn and pretzels. Low-salt or salt-free chips. The items listed above may not be a complete list of foods and beverages you can eat. Contact a dietitian for more information. What foods should I avoid? Vegetables Sauerkraut, pickled vegetables, and relishes. Olives. [place] fries. Onion rings. Regular canned vegetables (not low-sodium or reduced-sodium). Regular canned tomato sauce and paste (not low-sodium or reduced-sodium). Regular tomato and vegetable juice (not low-sodium or reduced-sodium). Frozen vegetables in sauces. Grains Instant hot cereals. Bread stuffing, pancake, and biscuit mixes. Croutons. Seasoned rice or pasta mixes. Noodle soup cups. Boxed or frozen macaroni and cheese. Regular salted crackers. Self-rising flour. Meats and other proteins Meat or fish that is salted, canned, smoked, spiced, or pickled. Precooked or cured meat, such as sausages or meat loaves. Tomasa Blase. Ham. Pepperoni. Hot dogs. Corned beef. Chipped beef. Salt pork. Jerky. Pickled herring. Anchovies and sardines. Regular canned tuna. Salted nuts. Dairy Processed cheese and cheese spreads. Hard cheeses. Cheese curds. Blue cheese. Feta cheese. String cheese. Regular cottage cheese. Buttermilk. Canned milk. Fats and oils Salted butter. Regular margarine. Ghee. Bacon fat. Seasonings and condiments Onion salt, garlic salt, seasoned salt, table salt, and sea salt. Canned and packaged gravies. Worcestershire sauce. Tartar sauce. Barbecue sauce. Teriyaki sauce. Soy sauce, including reduced-sodium. Steak sauce. Fish sauce. Oyster sauce. Cocktail sauce. Horseradish that you find on the shelf. Regular ketchup and mustard. Meat flavorings and tenderizers. Bouillon cubes. Hot sauce. Pre-made or packaged marinades. Pre-made or packaged taco  seasonings. Relishes. Regular salad dressings. Salsa. Other foods Salted popcorn and pretzels. Corn chips and puffs. Potato and tortilla chips. Canned or dried soups. Pizza. Frozen entrees and pot pies. The items listed above may not be a complete list of foods and beverages you should avoid. Contact a dietitian for more information. Summary Eating less sodium can help lower your blood pressure, reduce swelling, and protect your heart, liver, and kidneys. Most people on this plan should limit their sodium intake to 1,500-2,000 mg (milligrams) of sodium each day. Canned, boxed, and frozen foods are high in sodium. Restaurant foods, fast foods, and pizza are also very high in sodium. You also get sodium by adding salt to food. Try to cook at home, eat more fresh fruits and vegetables, and eat less fast food and canned, processed, or prepared foods. This information is not intended to replace advice given to you by your health care provider. Make sure you discuss any questions you have with your health care provider. Document Revised: 12/09/2019 Document Reviewed: 10/05/2019 Elsevier Patient Education  2022 [employer].

## 2021-12-05 NOTE — Progress Notes (Deleted)
Established Patient Office Visit  Subjective:  Patient ID: Shirley Terrell, female    DOB: 1986/06/26  Age: 36 y.o. MRN: [id number]  CC: No chief complaint on file.   HPI Shirley Terrell presents for ***   Past Medical History:  Diagnosis Date   Anemia    takes iron supplement   Asthma    prn inhaler   Hypertension    states BP normalized while pregnant and since delivery; is currently not on med.   Lisfranc dislocation 11/30/2016   left   Sleep apnea    uses cpap    Past Surgical History:  Procedure Laterality Date   CHOLECYSTECTOMY  04/13/2012   Procedure: LAPAROSCOPIC CHOLECYSTECTOMY WITH INTRAOPERATIVE CHOLANGIOGRAM;  Surgeon: Odis Hollingshead, MD;  Location: [place];  Service: General;  Laterality: N/A;  laparoscopic cholecystectomy with IOC   INCISIONAL HERNIA REPAIR  08/13/2018   LAPROSCOPIC   INCISIONAL HERNIA REPAIR N/A 08/13/2018   Procedure: LAPAROSCOPIC INCISIONAL HERNIA REPAIR;  Surgeon: Clovis Riley, MD;  Location: [place];  Service: General;  Laterality: N/A;   INSERTION OF MESH N/A 08/13/2018   Procedure: INSERTION OF MESH;  Surgeon: Clovis Riley, MD;  Location: [place];  Service: General;  Laterality: N/A;   OPEN REDUCTION INTERNAL FIXATION (ORIF) FOOT LISFRANC FRACTURE Left 12/18/2016   Procedure: OPEN REDUCTION INTERNAL FIXATION (ORIF) FOOT LISFRANC FRACTURE;  Surgeon: Wylene Simmer, MD;  Location: [place];  Service: Orthopedics;  Laterality: Left;    Family History  Problem Relation Age of Onset   Hypertension Mother    Stroke Mother    Renal Disease Mother        due to blood pressure   Heart disease Father        MI, no stents   Depression Father    Hypertension Father    Stroke Father    Cancer Paternal Grandmother        breast   Diabetes Paternal Grandmother    Cancer Paternal Grandfather    Hypertension Maternal Grandmother    Diabetes Other    Healthy Sister    Healthy Sister    Healthy Sister    Healthy Sister      Social History   Socioeconomic History   Marital status: Single    Spouse name: Not on file   Number of children: Not on file   Years of education: Not on file   Highest education level: Not on file  Occupational History   Not on file  Tobacco Use   Smoking status: Some Days    Packs/day: 0.00    Years: 3.00    Pack years: 0.00    Types: Cigarettes    Last attempt to quit: 12/14/2017    Years since quitting: 3.9   Smokeless tobacco: Never   Tobacco comments:    6 cigarettes/day  Vaping Use   Vaping Use: Never used  Substance and Sexual Activity   Alcohol use: Yes    Alcohol/week: 0.0 standard drinks    Comment: occasionally   Drug use: No   Sexual activity: Yes    Partners: Male    Birth control/protection: Implant  Other Topics Concern   Not on file  Social History Narrative   She is single with 2 daughters, age 28 yrs and 1 yr in 2019.    Social Determinants of Health   Financial Resource Strain: Not on file  Food Insecurity: Not on file  Transportation Needs: Not on file  Physical Activity: Not on file  Stress: Not on file  Social Connections: Not on file  Intimate Partner Violence: Not on file    Outpatient Medications Prior to Visit  Medication Sig Dispense Refill   albuterol (PROVENTIL HFA;VENTOLIN HFA) 108 (90 BASE) MCG/ACT inhaler Inhale 2 puffs into the lungs every 6 (six) hours as needed for wheezing. Reported on 04/11/2016     amLODipine (NORVASC) 5 MG tablet TAKE 1 TABLET (5 MG TOTAL) BY MOUTH DAILY. MUST HAVE OFFICE VISIT FOR REFILLS 90 tablet 0   Blood Pressure Monitor DEVI Please provide patient with insurance approved blood pressure monitor 1 Device 0   carvedilol (COREG) 12.5 MG tablet Take 1 tablet (12.5 mg total) by mouth 2 (two) times daily with a meal. 60 tablet 3   ferrous sulfate 325 (65 FE) MG tablet Take 325 mg by mouth daily.     hydrochlorothiazide (HYDRODIURIL) 25 MG tablet TAKE 1 TABLET (25 MG TOTAL) BY MOUTH DAILY. MUST HAVE  OFFICE VISIT FOR REFILLS 90 tablet 0   Misc. Devices MISC Please provide patient with an insurance approved CPAP.  Settings should be as follows: DME autopap 5-15.  Medium Cushion size Philips Respironics Minimal Data processing manager Under Nose Frame (M) mask and heated humidification. 1 each 0   nicotine (NICODERM CQ - DOSED IN MG/24 HOURS) 14 mg/24hr patch Place 1 patch (14 mg total) onto the skin daily. (Patient not taking: No sig reported) 42 patch 0   No facility-administered medications prior to visit.    No Known Allergies  ROS Review of Systems    Objective:    Physical Exam  Ht 5\' 3"  (1.6 m)    Wt 207 lb (93.9 kg)    LMP 11/12/2021    BMI 36.67 kg/m  Wt Readings from Last 3 Encounters:  12/05/21 207 lb (93.9 kg)  09/05/19 240 lb (108.9 kg)  09/21/18 226 lb (102.5 kg)     Health Maintenance Due  Topic Date Due   COVID-19 Vaccine (1) Never done   Hepatitis C Screening  Never done   INFLUENZA VACCINE  06/17/2021   PAP SMEAR-Modifier  08/24/2021    There are no preventive care reminders to display for this patient.  No results found for: TSH Lab Results  Component Value Date   WBC 21.3 (H) 05/24/2021   HGB 13.7 05/24/2021   HCT 42.2 05/24/2021   MCV 94.6 05/24/2021   PLT 318 05/24/2021   Lab Results  Component Value Date   NA 134 (L) 05/24/2021   K 4.1 05/24/2021   CO2 24 05/24/2021   GLUCOSE 103 (H) 05/24/2021   BUN 7 05/24/2021   CREATININE 0.65 05/24/2021   BILITOT 1.0 05/24/2021   ALKPHOS 108 05/24/2021   AST 16 05/24/2021   ALT 13 05/24/2021   PROT 6.9 05/24/2021   ALBUMIN 3.7 05/24/2021   CALCIUM 9.2 05/24/2021   ANIONGAP 9 05/24/2021   Lab Results  Component Value Date   CHOL 138 09/05/2019   Lab Results  Component Value Date   HDL 42 09/05/2019   Lab Results  Component Value Date   LDLCALC 84 09/05/2019   Lab Results  Component Value Date   TRIG 54 09/05/2019   Lab Results  Component Value Date   CHOLHDL 3.3 09/05/2019    Lab Results  Component Value Date   HGBA1C 5.5 09/05/2019      Assessment & Plan:   Problem List Items Addressed This Visit   None  No orders of the defined types were placed in this encounter.   Follow-up: No follow-ups on file.    Loraine Grip Mayers, PA-C

## 2021-12-05 NOTE — Progress Notes (Signed)
Braxtyn Dorff S. Mayers, PA-C °Physician Assistant °[facility] °[email or web address] °

## 2021-12-05 NOTE — Progress Notes (Addendum)
Acute Office Visit  Subjective:    Patient ID: Shirley Terrell, female    DOB: [birth year], 36 y.o.   MRN: [id number]  Chief Complaint  Patient presents with   Medication Refill    HTN    HPI: Patient presents today for refill of blood pressure medications. She took Amlodipine, Hydrochlorothiazide, and Carvedilol in the past. She states that she would take these medications "sparingly" when her home blood pressure readings were high because she knew she was running out of medication, and she was having trouble getting in for an appointment. She estimates that she has been out of medication for at least 6 months, but possibly closer to a year.   She became worried about her blood pressure today because she had onset of a headache. 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 It measured 157/112 mmHg. She has also admitted some fatigue.   She has been making efforts to manage her blood pressure with lifestyle modifications. She has lost  30-40 lbs since October 2020. She says her work is very active, and she has two dogs that need frequent walks. She has been increasing her water intake, drinking about 5-6 bottles per day. She also drinks cranberry juice, and has stopped drinking soda. She denies other dietary changes, stating that her weight loss has mostly come from her increase in activity.   She is interested in smoking cessation. She says it has been very difficult for her to do this because of the stress she is under as a single parent, and trying to provide for her family. She has tried nicotine patches in the past, but did not like them because it was difficult to remember to use them. She is interested in lozenges or gum because she wants something with an oral component.  Past Medical History:  Diagnosis Date   Anemia    takes iron supplement   Asthma    prn inhaler   Hypertension    states BP normalized while pregnant and since delivery; is  currently not on med.   Lisfranc dislocation 11/30/2016   left   Sleep apnea    uses cpap    Past Surgical History:  Procedure Laterality Date   CHOLECYSTECTOMY  04/13/2012   Procedure: LAPAROSCOPIC CHOLECYSTECTOMY WITH INTRAOPERATIVE CHOLANGIOGRAM;  Surgeon: Odis Hollingshead, MD;  Location: [place];  Service: General;  Laterality: N/A;  laparoscopic cholecystectomy with IOC   INCISIONAL HERNIA REPAIR  08/13/2018   LAPROSCOPIC   INCISIONAL HERNIA REPAIR N/A 08/13/2018   Procedure: LAPAROSCOPIC INCISIONAL HERNIA REPAIR;  Surgeon: Clovis Riley, MD;  Location: [place];  Service: General;  Laterality: N/A;   INSERTION OF MESH N/A 08/13/2018   Procedure: INSERTION OF MESH;  Surgeon: Clovis Riley, MD;  Location: [place];  Service: General;  Laterality: N/A;   OPEN REDUCTION INTERNAL FIXATION (ORIF) FOOT LISFRANC FRACTURE Left 12/18/2016   Procedure: OPEN REDUCTION INTERNAL FIXATION (ORIF) FOOT LISFRANC FRACTURE;  Surgeon: Wylene Simmer, MD;  Location: [place];  Service: Orthopedics;  Laterality: Left;    Family History  Problem Relation Age of Onset   Hypertension Mother    Stroke Mother    Renal Disease Mother        due to blood pressure   Heart disease Father        MI, no stents   Depression Father    Hypertension Father    Stroke Father  Cancer Paternal Grandmother        breast   Diabetes Paternal Grandmother    Cancer Paternal Grandfather    Hypertension Maternal Grandmother    Diabetes Other    Healthy Sister    Healthy Sister    Healthy Sister    Healthy Sister     Social History   Socioeconomic History   Marital status: Single    Spouse name: Not on file   Number of children: Not on file   Years of education: Not on file   Highest education level: Not on file  Occupational History   Not on file  Tobacco Use   Smoking status: Some Days    Packs/day: 0.00    Years: 3.00    Pack years: 0.00    Types: Cigarettes    Last attempt to quit:  12/14/2017    Years since quitting: 3.9   Smokeless tobacco: Never   Tobacco comments:    6 cigarettes/day  Vaping Use   Vaping Use: Never used  Substance and Sexual Activity   Alcohol use: Yes    Alcohol/week: 0.0 standard drinks    Comment: occasionally   Drug use: No   Sexual activity: Not Currently    Partners: Male    Birth control/protection: Abstinence  Other Topics Concern   Not on file  Social History Narrative   She is single with 2 daughters, age 68 yrs and 1 yr in 2019.    Social Determinants of Health   Financial Resource Strain: Not on file  Food Insecurity: Not on file  Transportation Needs: Not on file  Physical Activity: Not on file  Stress: Not on file  Social Connections: Not on file  Intimate Partner Violence: Not on file    Outpatient Medications Prior to Visit  Medication Sig Dispense Refill   albuterol (PROVENTIL HFA;VENTOLIN HFA) 108 (90 BASE) MCG/ACT inhaler Inhale 2 puffs into the lungs every 6 (six) hours as needed for wheezing. Reported on 04/11/2016     amLODipine (NORVASC) 5 MG tablet TAKE 1 TABLET (5 MG TOTAL) BY MOUTH DAILY. MUST HAVE OFFICE VISIT FOR REFILLS 90 tablet 0   Blood Pressure Monitor DEVI Please provide patient with insurance approved blood pressure monitor 1 Device 0   carvedilol (COREG) 12.5 MG tablet Take 1 tablet (12.5 mg total) by mouth 2 (two) times daily with a meal. 60 tablet 3   ferrous sulfate 325 (65 FE) MG tablet Take 325 mg by mouth daily.     Misc. Devices MISC Please provide patient with an insurance approved CPAP.  Settings should be as follows: DME autopap 5-15.  Medium Cushion size [employer] Respironics Minimal Architectural technologist Under Nose Frame (M) mask and heated humidification. 1 each 0   hydrochlorothiazide (HYDRODIURIL) 25 MG tablet TAKE 1 TABLET (25 MG TOTAL) BY MOUTH DAILY. MUST HAVE OFFICE VISIT FOR REFILLS 90 tablet 0   nicotine (NICODERM CQ - DOSED IN MG/24 HOURS) 14 mg/24hr patch Place 1 patch (14 mg  total) onto the skin daily. (Patient not taking: Reported on 09/05/2019) 42 patch 0   No facility-administered medications prior to visit.    No Known Allergies  Review of Systems  Constitutional:  Positive for fatigue.  HENT:  Negative for congestion.   Eyes:  Negative for photophobia and visual disturbance.  Respiratory:  Positive for shortness of breath. Negative for cough.   Cardiovascular:  Negative for chest pain and palpitations.  Gastrointestinal:  Negative for nausea and vomiting.  Endocrine: Negative for polyuria.  Genitourinary:  Negative for dysuria and frequency.  Musculoskeletal:  Positive for myalgias.  Neurological:  Positive for headaches. Negative for dizziness.  Psychiatric/Behavioral:  The patient is nervous/anxious.       Objective:    Physical Exam Constitutional:      Appearance: Normal appearance. She is obese.  HENT:     Head: Normocephalic and atraumatic.     Right Ear: External ear normal.     Left Ear: External ear normal.     Nose: Nose normal.     Mouth/Throat:     Mouth: Mucous membranes are moist.     Pharynx: Oropharynx is clear.  Eyes:     General: No scleral icterus.    Extraocular Movements: Extraocular movements intact.     Conjunctiva/sclera: Conjunctivae normal.     Pupils: Pupils are equal, round, and reactive to light.  Cardiovascular:     Rate and Rhythm: Normal rate and regular rhythm.     Heart sounds: Normal heart sounds. No murmur heard.   No friction rub. No gallop.  Pulmonary:     Effort: Pulmonary effort is normal.     Breath sounds: Normal breath sounds. No wheezing, rhonchi or rales.  Musculoskeletal:        General: Normal range of motion.  Skin:    General: Skin is warm and dry.  Neurological:     General: No focal deficit present.     Mental Status: She is alert and oriented to person, place, and time.  Psychiatric:        Mood and Affect: Mood normal.        Behavior: Behavior normal.        Thought  Content: Thought content normal.        Judgment: Judgment normal.    BP (!) 162/102 (BP Location: Left Arm, Patient Position: Sitting, Cuff Size: Large)    Pulse 93    Temp 98.2 F (36.8 C) (Oral)    Resp 18    Ht 5\' 3"  (1.6 m)    Wt 207 lb (93.9 kg)    LMP 11/12/2021    SpO2 96%    BMI 36.67 kg/m  Wt Readings from Last 3 Encounters:  12/05/21 207 lb (93.9 kg)  09/05/19 240 lb (108.9 kg)  09/21/18 226 lb (102.5 kg)    Health Maintenance Due  Topic Date Due   COVID-19 Vaccine (1) Never done   Hepatitis C Screening  Never done   INFLUENZA VACCINE  06/17/2021   PAP SMEAR-Modifier  08/24/2021    There are no preventive care reminders to display for this patient.   No results found for: TSH Lab Results  Component Value Date   WBC 21.3 (H) 05/24/2021   HGB 13.7 05/24/2021   HCT 42.2 05/24/2021   MCV 94.6 05/24/2021   PLT 318 05/24/2021   Lab Results  Component Value Date   NA 134 (L) 05/24/2021   K 4.1 05/24/2021   CO2 24 05/24/2021   GLUCOSE 103 (H) 05/24/2021   BUN 7 05/24/2021   CREATININE 0.65 05/24/2021   BILITOT 1.0 05/24/2021   ALKPHOS 108 05/24/2021   AST 16 05/24/2021   ALT 13 05/24/2021   PROT 6.9 05/24/2021   ALBUMIN 3.7 05/24/2021   CALCIUM 9.2 05/24/2021   ANIONGAP 9 05/24/2021   Lab Results  Component Value Date   CHOL 138 09/05/2019   Lab Results  Component Value Date   HDL 42 09/05/2019  Lab Results  Component Value Date   LDLCALC 84 09/05/2019   Lab Results  Component Value Date   TRIG 54 09/05/2019   Lab Results  Component Value Date   CHOLHDL 3.3 09/05/2019   Lab Results  Component Value Date   HGBA1C 5.5 09/05/2019       Assessment & Plan:   Problem List Items Addressed This Visit       Cardiovascular and Mediastinum   Essential hypertension - Primary   Relevant Medications   hydrochlorothiazide (HYDRODIURIL) 25 MG tablet   Other Relevant Orders   Comp. Metabolic Panel (12)   CBC with Differential/Platelet   TSH      Other   Tobacco abuse   Anemia   Relevant Orders   Iron, TIBC and Ferritin Panel   Screening, lipid   Relevant Orders   Lipid panel  1. Essential hypertension Restart hydrochlorothiazide 25 mg. Sent to pharmacy. CMP ordered today to check kidney function. Because she has lost a significant amount of weight and she has not had medication for many months, it seems reasonable to restart with only hydrochlorothiazide and then check BP response and reassess at appointment scheduled in 3 weeks at PCP. Discussed lifestyle modifications to manage blood pressure, including dietary alterations such as decreasing dietary sodium, and ensuring adequate hydration.  - Comp. Metabolic Panel (12) - CBC with Differential/Platelet; Future - TSH; Future - hydrochlorothiazide (HYDRODIURIL) 25 MG tablet; Take 1 tablet (25 mg total) by mouth daily.  Dispense: 30 tablet; Refill: 1 - TSH  2. Tobacco abuse Discussed smoking cessation today. Patient is interested and motivated to quit. She is interested in using nicotine lozenges. These can be sent to pharmacy or purchased OTC.  3. Screening, lipid Routine screening for lipids when fasting. - Lipid panel; Future  4. Anemia, unspecified type Patient has a history of anemia and endorses fatigue today. Iron, TIBC, and Ferritin ordered to check for anemias. TSH also ordered to rule out hypothyroidism as cause of fatigue.  - Iron, TIBC and Ferritin Panel   I have reviewed the patient's medical history (PMH, PSH, Social History, Family History, Medications, and allergies) , and have been updated if relevant. I spent 30 minutes reviewing chart and  face to face time with patient.    Meds ordered this encounter  Medications   hydrochlorothiazide (HYDRODIURIL) 25 MG tablet    Sig: Take 1 tablet (25 mg total) by mouth daily.    Dispense:  30 tablet    Refill:  1    Order Specific Question:   Supervising Provider    Answer:   Asencion Noble E [[id number]]      Cari Chauncey Cruel Mayers, PA-C

## 2021-12-05 NOTE — Telephone Encounter (Signed)
Reason for Disposition  Ran out of BP medications  Answer Assessment - Initial Assessment Questions 1. BLOOD PRESSURE: "What is the blood pressure?" "Did you take at least two measurements 5 minutes apart?"     Pt BP 157/111 2. ONSET: "When did you take your blood pressure?"     An hour ago.  I'm at work.   Co worker took my BP. 3. HOW: "How did you obtain the blood pressure?" (e.g., visiting nurse, automatic home BP monitor)     Automatic machine 4. HISTORY: "Do you have a history of high blood pressure?"     Yes 5. MEDICATIONS: "Are you taking any medications for blood pressure?" "Have you missed any doses recently?"     I ran out of medications.  I'm trying to get an appt.    Pharmacy won't give me the medicine because I need an appt.    I've been off my meds 6 months. 6. OTHER SYMPTOMS: "Do you have any symptoms?" (e.g., headache, chest pain, blurred vision, difficulty breathing, weakness)     Headache.   I'm a little short of breath but I have asthma. 7. PREGNANCY: "Is there any chance you are pregnant?" "When was your last menstrual period?"     *No Answer*  Protocols used: Blood Pressure - High-A-AH

## 2021-12-05 NOTE — Telephone Encounter (Signed)
°  Chief Complaint: Headache from running out of BP meds 6 mo. Ago  157/111 now Symptoms: headache Frequency: daily Pertinent Negatives: Patient denies dizziness, blurred vision, speech is clear and appropriate Disposition: [] ED /[] Urgent Care (no appt availability in office) / [] Appointment(In office/virtual)/ []  [facility]/ [] Home Care/ [] Refused Recommended Disposition /[x] [facility]/ []  Follow-up with PCP Additional Notes: I gave her the location for the [facility]    She is going there after she gets off work at [employer] today.    I went over s/s with her to go on to the ED.   I made her an appt too.

## 2021-12-06 ENCOUNTER — Other Ambulatory Visit: Payer: Self-pay | Admitting: Nurse Practitioner

## 2021-12-06 ENCOUNTER — Encounter: Payer: Self-pay | Admitting: Nurse Practitioner

## 2021-12-06 DIAGNOSIS — I10 Essential (primary) hypertension: Secondary | ICD-10-CM

## 2021-12-06 LAB — COMP. METABOLIC PANEL (12)
AST: 14 IU/L (ref 0–40)
Albumin/Globulin Ratio: 1.7 (ref 1.2–2.2)
Albumin: 4.1 g/dL (ref 3.8–4.8)
Alkaline Phosphatase: 127 IU/L — ABNORMAL HIGH (ref 44–121)
BUN/Creatinine Ratio: 22 (ref 9–23)
BUN: 16 mg/dL (ref 6–20)
Bilirubin Total: 0.3 mg/dL (ref 0.0–1.2)
Calcium: 9.1 mg/dL (ref 8.7–10.2)
Chloride: 104 mmol/L (ref 96–106)
Creatinine, Ser: 0.74 mg/dL (ref 0.57–1.00)
Globulin, Total: 2.4 g/dL (ref 1.5–4.5)
Glucose: 92 mg/dL (ref 70–99)
Potassium: 3.8 mmol/L (ref 3.5–5.2)
Sodium: 140 mmol/L (ref 134–144)
Total Protein: 6.5 g/dL (ref 6.0–8.5)
eGFR: 109 mL/min/{1.73_m2} (ref >59)

## 2021-12-06 LAB — TSH: TSH: 2.2 u[IU]/mL (ref 0.450–4.500)

## 2021-12-06 LAB — IRON,TIBC AND FERRITIN PANEL
Ferritin: 23 ng/mL (ref 15–150)
Iron Saturation: 12 % — ABNORMAL LOW (ref 15–55)
Iron: 37 ug/dL (ref 27–159)
Total Iron Binding Capacity: 309 ug/dL (ref 250–450)
UIBC: 272 ug/dL (ref 131–425)

## 2021-12-06 MED ORDER — AMLODIPINE BESYLATE 5 MG PO TABS: 5.0000 mg | ORAL_TABLET | Freq: Every day | ORAL | 0 refills | Status: DC

## 2021-12-06 MED ORDER — ALBUTEROL SULFATE HFA 108 (90 BASE) MCG/ACT IN AERS: 2.0000 | INHALATION_SPRAY | Freq: Four times a day (QID) | RESPIRATORY_TRACT | 1 refills | Status: DC | PRN

## 2021-12-06 MED ORDER — CARVEDILOL 12.5 MG PO TABS: 12.5000 mg | ORAL_TABLET | Freq: Two times a day (BID) | ORAL | 0 refills | Status: DC

## 2021-12-12 ENCOUNTER — Telehealth: Payer: Self-pay | Admitting: *Deleted

## 2021-12-12 NOTE — Telephone Encounter (Signed)
-----   Message from Roney Jaffe, [place] sent at 12/09/2021  2:16 PM EST ----- Please call patient and let her know that her thyroid function, kidney and liver function are within normal limits, she does have 1 liver enzyme that is very slightly elevated but not of concern at this time.  Her iron levels are within normal limits, still waiting for her to have her cholesterol and CBC completed

## 2021-12-12 NOTE — Telephone Encounter (Signed)
Patient verified DOB Patient is aware of results and needing to complete cholesterol and CBC at her next visit.

## 2021-12-25 ENCOUNTER — Encounter: Payer: Self-pay | Admitting: Physician Assistant

## 2021-12-25 ENCOUNTER — Ambulatory Visit: Payer: Medicaid Other | Attending: Physician Assistant | Admitting: Physician Assistant

## 2021-12-25 ENCOUNTER — Other Ambulatory Visit: Payer: Self-pay

## 2021-12-25 VITALS — BP 165/112 | HR 86 | Resp 16 | Wt 207.0 lb

## 2021-12-25 DIAGNOSIS — F17200 Nicotine dependence, unspecified, uncomplicated: Secondary | ICD-10-CM | POA: Diagnosis not present

## 2021-12-25 DIAGNOSIS — Z13220 Encounter for screening for lipoid disorders: Secondary | ICD-10-CM | POA: Diagnosis not present

## 2021-12-25 DIAGNOSIS — Z6841 Body Mass Index (BMI) 40.0 and over, adult: Secondary | ICD-10-CM

## 2021-12-25 DIAGNOSIS — I10 Essential (primary) hypertension: Secondary | ICD-10-CM | POA: Diagnosis not present

## 2021-12-25 MED ORDER — HYDROCHLOROTHIAZIDE 25 MG PO TABS: 25.0000 mg | ORAL_TABLET | Freq: Every day | ORAL | 1 refills | Status: DC

## 2021-12-25 MED ORDER — CARVEDILOL 12.5 MG PO TABS: 12.5000 mg | ORAL_TABLET | Freq: Two times a day (BID) | ORAL | 1 refills | Status: DC

## 2021-12-25 MED ORDER — AMLODIPINE BESYLATE 5 MG PO TABS: 5.0000 mg | ORAL_TABLET | Freq: Every day | ORAL | 1 refills | Status: DC

## 2021-12-25 NOTE — Patient Instructions (Addendum)
1-800-quit-now   Managing the Challenge of Quitting Smoking Quitting smoking is a physical and mental challenge. You will face cravings, withdrawal symptoms, and temptation. Before quitting, work with your health care provider to make a plan that can help you manage quitting. Preparation can help you quit and keep you from giving in. How to manage lifestyle changes Managing stress Stress can make you want to smoke, and wanting to smoke may cause stress. It is important to find ways to manage your stress. You might try some of the following: Practice relaxation techniques. Breathe slowly and deeply, in through your nose and out through your mouth. Listen to music. Soak in a bath or take a shower. Imagine a peaceful place or vacation. Get some support. Talk with family or friends about your stress. Join a support group. Talk with a counselor or therapist. Get some physical activity. Go for a walk, run, or bike ride. Play a favorite sport. Practice yoga.  Medicines Talk with your health care provider about medicines that might help you deal with cravings and make quitting easier for you. Relationships Social situations can be difficult when you are quitting smoking. To manage this, you can: Avoid parties and other social situations where people might be smoking. Avoid alcohol. Leave right away if you have the urge to smoke. Explain to your family and friends that you are quitting smoking. Ask for support and let them know you might be a bit grumpy. Plan activities where smoking is not an option. General instructions Be aware that many people gain weight after they quit smoking. However, not everyone does. To keep from gaining weight, have a plan in place before you quit and stick to the plan after you quit. Your plan should include: Having healthy snacks. When you have a craving, it may help to: Eat popcorn, carrots, celery, or other cut vegetables. Chew sugar-free gum. Changing how  you eat. Eat small portion sizes at meals. Eat 4-6 small meals throughout the day instead of 1-2 large meals a day. Be mindful when you eat. Do not watch television or do other things that might distract you as you eat. Exercising regularly. Make time to exercise each day. If you do not have time for a long workout, do short bouts of exercise for 5-10 minutes several times a day. Do some form of strengthening exercise, such as weight lifting. Do some exercise that gets your heart beating and causes you to breathe deeply, such as walking fast, running, swimming, or biking. This is very important. Drinking plenty of water or other low-calorie or no-calorie drinks. Drink 6-8 glasses of water daily.  How to recognize withdrawal symptoms Your body and mind may experience discomfort as you try to get used to not having nicotine in your system. These effects are called withdrawal symptoms. They may include: Feeling hungrier than normal. Having trouble concentrating. Feeling irritable or restless. Having trouble sleeping. Feeling depressed. Craving a cigarette. To manage withdrawal symptoms: Avoid places, people, and activities that trigger your cravings. Remember why you want to quit. Get plenty of sleep. Avoid coffee and other caffeinated drinks. These may worsen some of your symptoms. These symptoms may surprise you. But be assured that they are normal to have when quitting smoking. How to manage cravings Come up with a plan for how to deal with your cravings. The plan should include the following: A definition of the specific situation you want to deal with. An alternative action you will take. A clear idea for  how this action will help. The name of someone who might help you with this. Cravings usually last for 5-10 minutes. Consider taking the following actions to help you with your plan to deal with cravings: Keep your mouth busy. Chew sugar-free gum. Suck on hard candies or a  straw. Brush your teeth. Keep your hands and body busy. Change to a different activity right away. Squeeze or play with a ball. Do an activity or a hobby, such as making bead jewelry, practicing needlepoint, or working with wood. Mix up your normal routine. Take a short exercise break. Go for a quick walk or run up and down stairs. Focus on doing something kind or helpful for someone else. Call a friend or family member to talk during a craving. Join a support group. Contact a quitline. Where to find support To get help or find a support group: Call the National Cancer Institute's Smoking Quitline: 1-800-QUIT NOW (419)852-9983) Visit the website of the Substance Abuse and Mental Health Services Administration: SkateOasis.com.pt Text QUIT to SmokefreeTXT: 174944 Where to find more information Visit these websites to find more information on quitting smoking: National Cancer Institute: www.smokefree.gov American Lung Association: www.lung.org American Cancer Society: www.cancer.org Centers for Disease Control and Prevention: FootballExhibition.com.br American Heart Association: www.heart.org Contact a health care provider if: You want to change your plan for quitting. The medicines you are taking are not helping. Your eating feels out of control or you cannot sleep. Get help right away if: You feel depressed or become very anxious. Summary Quitting smoking is a physical and mental challenge. You will face cravings, withdrawal symptoms, and temptation to smoke again. Preparation can help you as you go through these challenges. Try different techniques to manage stress, handle social situations, and prevent weight gain. You can deal with cravings by keeping your mouth busy (such as by chewing gum), keeping your hands and body busy, calling family or friends, or contacting a quitline for people who want to quit smoking. You can deal with withdrawal symptoms by avoiding places where people smoke, getting  plenty of rest, and avoiding drinks with caffeine. This information is not intended to replace advice given to you by your health care provider. Make sure you discuss any questions you have with your health care provider. Document Revised: 07/12/2021 Document Reviewed: 08/23/2019 Elsevier Patient Education  2022 [employer].

## 2021-12-25 NOTE — Progress Notes (Signed)
Patient ID: Shirley Terrell, female   DOB: 22-Apr-1986, 36 y.o.   MRN: [id number]   Shirley Terrell, is a 36 y.o. female  [id number]  [id number]  DOB - Dec 15, 1985  Chief Complaint  Patient presents with   Hypertension       Subjective:   Shirley Terrell is a 36 y.o. female here today for a follow up visit -Seen by Carrolyn Meiers 12/05/2021 and resumed on HCTZ.  BP not going down.  She has historically required 3 medications.  She does c/o HA when BP is not high.  She is interested in seeing a nutritionist and is seeking out methods to quit smoking.  She also desires more exercise.     Patient has occasional headache, No chest pain, No abdominal pain - No Nausea, No new weakness tingling or numbness, No Cough - SOB.  No problems updated.  ALLERGIES: No Known Allergies  PAST MEDICAL HISTORY: Past Medical History:  Diagnosis Date   Anemia    takes iron supplement   Asthma    prn inhaler   Hypertension    states BP normalized while pregnant and since delivery; is currently not on med.   Lisfranc dislocation 11/30/2016   left   Sleep apnea    uses cpap    MEDICATIONS AT HOME: Prior to Admission medications   Medication Sig Start Date End Date Taking? Authorizing Provider  albuterol (VENTOLIN HFA) 108 (90 Base) MCG/ACT inhaler Inhale 2 puffs into the lungs every 6 (six) hours as needed for wheezing. Reported on 04/11/2016 12/06/21   Gildardo Pounds, NP  amLODipine (NORVASC) 5 MG tablet Take 1 tablet (5 mg total) by mouth daily for 20 days. Must have office visit for refills 12/25/21 01/14/22  Argentina Donovan, PA-C  Blood Pressure Monitor DEVI Please provide patient with insurance approved blood pressure monitor 09/05/19   Gildardo Pounds, NP  carvedilol (COREG) 12.5 MG tablet Take 1 tablet (12.5 mg total) by mouth 2 (two) times daily with a meal for 20 days. 12/25/21 01/14/22  Argentina Donovan, PA-C  ferrous sulfate 325 (65 FE) MG tablet Take 325 mg by mouth daily.    [provider]  hydrochlorothiazide (HYDRODIURIL) 25 MG tablet Take 1 tablet (25 mg total) by mouth daily. 12/25/21 03/25/22  Argentina Donovan, PA-C  Misc. Devices MISC Please provide patient with an insurance approved CPAP.  Settings should be as follows: DME autopap 5-15.  Medium Cushion size [employer] Minimal Architectural technologist Under Nose Frame (M) mask and heated humidification. 07/15/18   Gildardo Pounds, NP  nicotine (NICODERM CQ - DOSED IN MG/24 HOURS) 14 mg/24hr patch Place 1 patch (14 mg total) onto the skin daily. Patient not taking: Reported on 09/05/2019 03/22/18   Gildardo Pounds, NP    ROS: Neg HEENT Neg resp Neg cardiac Neg GI Neg GU Neg MS Neg psych  Objective:   Vitals:   12/25/21 1648  BP: (!) 165/112  Pulse: 86  Resp: 16  SpO2: 99%  Weight: 207 lb (93.9 kg)   Exam General appearance : Awake, alert, not in any distress. Speech Clear. Not toxic looking HEENT: Atraumatic and Normocephalic  Chest: Good air entry bilaterally, CTAB.  No rales/rhonchi/wheezing CVS: S1 S2 regular, no murmurs.  Extremities: B/L Lower Ext shows no edema, both legs are warm to touch Neurology: Awake alert, and oriented X 3, CN II-XII intact, Non focal Skin: No Rash  Data Review Lab Results  Component Value Date  HGBA1C 5.5 09/05/2019    Assessment & Plan   1. Essential hypertension Uncontrolled. She will check BP OOO at least 3-4 times weekly and record. We have discussed target BP range and blood pressure goal. I have advised patient to check BP regularly and to call us back or report to clinic if the numbers are consistently higher than 140/90. We discussed the importance of compliance with medical therapy and DASH diet recommended, consequences of uncontrolled hypertension discussed.   - Basic metabolic panel - CBC with Differential/Platelet - amLODipine (NORVASC) 5 MG tablet; Take 1 tablet (5 mg total) by mouth daily for 20 days. Must have office visit for  refills  Dispense: 90 tablet; Refill: 1 - carvedilol (COREG) 12.5 MG tablet; Take 1 tablet (12.5 mg total) by mouth 2 (two) times daily with a meal for 20 days.  Dispense: 180 tablet; Refill: 1 - hydrochlorothiazide (HYDRODIURIL) 25 MG tablet; Take 1 tablet (25 mg total) by mouth daily.  Dispense: 90 tablet; Refill: 1  2. Screening, lipid - Lipid panel  3. Morbid obesity with BMI of 40.0-44.9, adult [facility]) - Referral to Nutrition and Diabetes Services  4. Smoking Smoking and dangers of nicotine have been discussed at length. Long term health consequences of smoking reviewed in detail.  Methods for helping with cessation have been reviewed.  Patient expresses understanding. 1-800-quit-now  I have had a lengthy discussion and provided education about insulin resistance and the intake of too much sugar/refined carbohydrates.  I have advised the patient to work at a goal of eliminating sugary drinks, candy, desserts, sweets, refined sugars, processed foods, and white carbohydrates.  The patient expresses understanding.     Patient have been counseled extensively about nutrition and exercise. Other issues discussed during this visit include: low cholesterol diet, weight control and daily exercise, foot care, annual eye examinations at Ophthalmology, importance of adherence with medications and regular follow-up. We also discussed long term complications of uncontrolled diabetes and hypertension.   Return for recheck BP with Luke in 3 weeks and see PCP in 4 months.  The patient was given clear instructions to go to ER or return to medical center if symptoms don't improve, worsen or new problems develop. The patient verbalized understanding. The patient was told to call to get lab results if they haven't heard anything in the next week.      Freeman Caldron, PA-C [facility] and [facility] [place], [place]   12/26/2021, 10:52 AM

## 2021-12-26 LAB — LIPID PANEL

## 2021-12-27 LAB — CBC WITH DIFFERENTIAL/PLATELET
Basophils Absolute: 0.1 10*3/uL (ref 0.0–0.2)
Basos: 0 %
EOS (ABSOLUTE): 0.2 10*3/uL (ref 0.0–0.4)
Eos: 2 %
Hematocrit: 38 % (ref 34.0–46.6)
Hemoglobin: 12.8 g/dL (ref 11.1–15.9)
Immature Grans (Abs): 0 10*3/uL (ref 0.0–0.1)
Immature Granulocytes: 0 %
Lymphocytes Absolute: 2.3 10*3/uL (ref 0.7–3.1)
Lymphs: 20 %
MCH: 31.1 pg (ref 26.6–33.0)
MCHC: 33.7 g/dL (ref 31.5–35.7)
MCV: 93 fL (ref 79–97)
Monocytes Absolute: 0.7 10*3/uL (ref 0.1–0.9)
Monocytes: 6 %
Neutrophils Absolute: 7.9 10*3/uL — ABNORMAL HIGH (ref 1.4–7.0)
Neutrophils: 72 %
Platelets: 272 10*3/uL (ref 150–450)
RBC: 4.11 x10E6/uL (ref 3.77–5.28)
RDW: 11.3 % — ABNORMAL LOW (ref 11.7–15.4)
WBC: 11.2 10*3/uL — ABNORMAL HIGH (ref 3.4–10.8)

## 2021-12-27 LAB — LIPID PANEL
Chol/HDL Ratio: 2.9 ratio (ref 0.0–4.4)
Cholesterol, Total: 154 mg/dL (ref 100–199)
HDL: 53 mg/dL (ref >39)
LDL Chol Calc (NIH): 91 mg/dL (ref 0–99)
Triglycerides: 47 mg/dL (ref 0–149)
VLDL Cholesterol Cal: 10 mg/dL (ref 5–40)

## 2021-12-27 LAB — BASIC METABOLIC PANEL
BUN/Creatinine Ratio: 24 — ABNORMAL HIGH (ref 9–23)
BUN: 16 mg/dL (ref 6–20)
CO2: 21 mmol/L (ref 20–29)
Calcium: 9.6 mg/dL (ref 8.7–10.2)
Chloride: 107 mmol/L — ABNORMAL HIGH (ref 96–106)
Creatinine, Ser: 0.68 mg/dL (ref 0.57–1.00)
Glucose: 93 mg/dL (ref 70–99)
Potassium: 4 mmol/L (ref 3.5–5.2)
Sodium: 145 mmol/L — ABNORMAL HIGH (ref 134–144)
eGFR: 118 mL/min/{1.73_m2} (ref >59)

## 2021-12-30 ENCOUNTER — Telehealth: Payer: Self-pay | Admitting: *Deleted

## 2021-12-30 NOTE — Telephone Encounter (Signed)
Patient verified DOB Patient is aware of labs being normal. No concerns at this time.

## 2021-12-30 NOTE — Telephone Encounter (Signed)
-----   Message from Roney Jaffe, [place] sent at 12/30/2021  9:28 AM EST ----- Please call patient and let her know that her cholesterol level is within normal limits, she does not show signs of anemia.

## 2022-01-14 ENCOUNTER — Telehealth (HOSPITAL_BASED_OUTPATIENT_CLINIC_OR_DEPARTMENT_OTHER): Payer: Medicaid Other | Admitting: Nurse Practitioner

## 2022-01-14 ENCOUNTER — Encounter: Payer: Self-pay | Admitting: Nurse Practitioner

## 2022-01-14 DIAGNOSIS — Z789 Other specified health status: Secondary | ICD-10-CM

## 2022-01-14 DIAGNOSIS — G4733 Obstructive sleep apnea (adult) (pediatric): Secondary | ICD-10-CM | POA: Diagnosis not present

## 2022-01-14 NOTE — Progress Notes (Signed)
Virtual Visit Note Due to national recommendations of social distancing due to COVID 19, virtual visit is felt to be most appropriate for this patient at this time.  I discussed the limitations, risks, security and privacy concerns of performing an evaluation and management service by video and the availability of in person appointments. I also discussed with the patient that there may be a patient responsible charge related to this service. The patient expressed understanding and agreed to proceed.    I connected with [name] on 01/14/22  at   3:50 PM EST  EDT by VIDEO and verified that I am speaking with the correct person using two identifiers.   Location of Patient: Private Residence   [facility] Provider[facility]    Persons participating in VIRTUAL visit: Bertram Denver FNP-BC [name]    History of Present Illness: VIRTUAL visit for: OSA with CPAP equipment replacement  Needs replacement parts for her CPAP including tubing, mask, and cords. She has been without her CPAP for a few weeks due to the cord falling off the back of the CPAP machine. She was diagnosed with OSA 01-13-2018. Prior to obtaining her CPAP she endorsed symptoms of HTN, morbid obesity, snoring, choking, excessive daytime sleepiness, awakening in the middle of the night because of SOB and apneic episodes, increase in weight, difficulty falling asleep once awakened. Once she started using her CPAP the majority of her symptoms significantly improved and or resolved.    Past Medical History:  Diagnosis Date   Anemia    takes iron supplement   Asthma    prn inhaler   Hypertension    states BP normalized while pregnant and since delivery; is currently not on med.   Lisfranc dislocation 11/30/2016   left   Sleep apnea    uses cpap    Past Surgical History:  Procedure Laterality Date   CHOLECYSTECTOMY  04/13/2012   Procedure: LAPAROSCOPIC CHOLECYSTECTOMY WITH  INTRAOPERATIVE CHOLANGIOGRAM;  Surgeon: Adolph Pollack, MD;  Location: [facility] OR;  Service: General;  Laterality: N/A;  laparoscopic cholecystectomy with IOC   INCISIONAL HERNIA REPAIR  08/13/2018   LAPROSCOPIC   INCISIONAL HERNIA REPAIR N/A 08/13/2018   Procedure: LAPAROSCOPIC INCISIONAL HERNIA REPAIR;  Surgeon: Berna Bue, MD;  Location: [facility] OR;  Service: General;  Laterality: N/A;   INSERTION OF MESH N/A 08/13/2018   Procedure: INSERTION OF MESH;  Surgeon: Berna Bue, MD;  Location: [facility] OR;  Service: General;  Laterality: N/A;   OPEN REDUCTION INTERNAL FIXATION (ORIF) FOOT LISFRANC FRACTURE Left 12/18/2016   Procedure: OPEN REDUCTION INTERNAL FIXATION (ORIF) FOOT LISFRANC FRACTURE;  Surgeon: Toni Arthurs, MD;  Location: [facility];  Service: Orthopedics;  Laterality: Left;    Family History  Problem Relation Age of Onset   Hypertension Mother    Stroke Mother    Renal Disease Mother        due to blood pressure   Heart disease Father        MI, no stents   Depression Father    Hypertension Father    Stroke Father    Cancer Paternal Grandmother        breast   Diabetes Paternal Grandmother    Cancer Paternal Grandfather    Hypertension Maternal Grandmother    Diabetes Other    Healthy Sister    Healthy Sister    Healthy Sister    Healthy Sister     Social History  Socioeconomic History   Marital status: Single    Spouse name: Not on file   Number of children: Not on file   Years of education: Not on file   Highest education level: Not on file  Occupational History   Not on file  Tobacco Use   Smoking status: Some Days    Packs/day: 0.00    Years: 3.00    Pack years: 0.00    Types: Cigarettes    Last attempt to quit: 12/14/2017    Years since quitting: 4.0   Smokeless tobacco: Never   Tobacco comments:    6 cigarettes/day  Vaping Use   Vaping Use: Never used  Substance and Sexual Activity   Alcohol use: Yes    Alcohol/week: 0.0 standard  drinks    Comment: occasionally   Drug use: No   Sexual activity: Not Currently    Partners: Male    Birth control/protection: Abstinence  Other Topics Concern   Not on file  Social History Narrative   She is single with 2 daughters, age 1 yrs and 1 yr in 2019.    Social Determinants of Health   Financial Resource Strain: Not on file  Food Insecurity: Not on file  Transportation Needs: Not on file  Physical Activity: Not on file  Stress: Not on file  Social Connections: Not on file     Observations/Objective: Awake, alert and oriented x 3   Review of Systems  Constitutional:  Negative for fever, malaise/fatigue and weight loss.  HENT: Negative.  Negative for nosebleeds.   Eyes: Negative.  Negative for blurred vision, double vision and photophobia.  Respiratory: Negative.  Negative for cough and shortness of breath.   Cardiovascular: Negative.  Negative for chest pain, palpitations and leg swelling.  Gastrointestinal: Negative.  Negative for heartburn, nausea and vomiting.  Musculoskeletal: Negative.  Negative for myalgias.  Neurological: Negative.  Negative for dizziness, focal weakness, seizures and headaches.  Psychiatric/Behavioral: Negative.  Negative for suicidal ideas.    Assessment and Plan: Diagnoses and all orders for this visit:  Difficulty with CPAP use  Obstructive sleep apnea syndrome, mild     Follow Up Instructions No follow-ups on file.     I discussed the assessment and treatment plan with the patient. The patient was provided an opportunity to ask questions and all were answered. The patient agreed with the plan and demonstrated an understanding of the instructions.   The patient was advised to call back or seek an in-person evaluation if the symptoms worsen or if the condition fails to improve as anticipated.  I provided 10 minutes of face-to-face time during this encounter including median intraservice time, reviewing previous notes, labs,  imaging, medications and explaining diagnosis and management.  Claiborne Rigg, FNP-BC

## 2022-01-31 NOTE — Progress Notes (Signed)
? ?  S:    ?Shirley Terrell is a 36 y.o. female who presents for hypertension evaluation, education, and management. PMH is significant for HTN, tobacco abuse, OSA, obesity. Patient was referred and last seen by Primary Care Provider, Bertram Denver, NP, on 01/14/22 by video visit. Patient was referred on 12/25/21 after visit with Georgian Co, PA-C, when BP was 165/112.  ? ?Today, she arrives in good spirits and presents without assistance. Denies dizziness, headache, blurred vision, chest pain. Will occasionally get a HA at home. Endorses L LE pain in her calf and lower back pain. Denies any LE edema different from her baseline and this can be attributed to her intense work schedule. She is on her feet most of that day at work. No lower extremity erythema or acute pain.  ? ?Patient reports hypertension is longstanding. Was out of her medication for a prolonged period of time before seeing Cari at our mobile unit on 12/05/2021. HCTZ was restarted. She was then seen by Marylene Land on 12/25/2021. BP was still elevated at that appt, so Angela added amlodipine.  ? ?Family/Social history:  ?-HTN in mother and father; stroke in mother and father, MI in father ?-Current smoker ? ?Medication adherence reported. She denies any missed doses in the past week Patient has taken BP medications today.  ? ?Current antihypertensives include:  amlodipine 5 mg daily, HCTZ 25 mg daily, carvedilol 12.5 mg BID ? ?Patient reported dietary habits: Has been working on losing weight, has lost 40 lbs so far ? ?Patient-reported exercise habits: on her feet all day at work, incorporating more exercise into her day ? ?ASCVD risk factors include: strong Fhx ASCVD ? ?O:  ?Vitals:  ? 02/03/22 0926  ?BP: 111/75  ?Pulse: 84  ? ? ?Last 3 Office BP readings: ?BP Readings from Last 3 Encounters:  ?02/03/22 111/75  ?12/25/21 (!) 165/112  ?12/05/21 (!) 162/102  ? ? ?BMET ?   ?Component Value Date/Time  ? NA 145 (H) 12/25/2021 1648  ? K 4.0 12/25/2021 1648  ? CL  107 (H) 12/25/2021 1648  ? CO2 21 12/25/2021 1648  ? GLUCOSE 93 12/25/2021 1648  ? GLUCOSE 103 (H) 05/24/2021 1531  ? BUN 16 12/25/2021 1648  ? CREATININE 0.68 12/25/2021 1648  ? CREATININE 0.55 02/07/2016 1446  ? CALCIUM 9.6 12/25/2021 1648  ? GFRNONAA >60 05/24/2021 1531  ? GFRAA 119 09/05/2019 1126  ? ? ?Renal function: ?CrCl cannot be calculated (Patient's most recent lab result is older than the maximum 21 days allowed.). ? ?Clinical ASCVD: No  ?The ASCVD Risk score (Arnett DK, et al., 2019) failed to calculate for the following reasons: ?  The 2019 ASCVD risk score is only valid for ages 37 to 28 ? ?A/P: ?Hypertension diagnosed currently at goal on current medications. BP goal < 130/80 mmHg. Medication adherence appears optimal.  ?-Continue current medications: amlodipine 5 mg daily, HCTZ 25 mg daily, carvedilol 12.5 mg BID ?-Counseled on lifestyle modifications for blood pressure control including reduced dietary sodium, increased exercise, adequate sleep. ?-Encouraged patient to check BP at home and bring log of readings to next visit. Counseled on proper use of home BP cuff.  ? ?Results reviewed and written information provided. Patient verbalized understanding of treatment plan. Total time in face-to-face counseling 28 minutes.  ? ?F/u clinic visit with Shirley Terrell. ? ?Pervis Hocking, PharmD ?PGY2 Ambulatory Care Pharmacy Resident ?02/03/2022 11:17 AM ?  ?

## 2022-02-03 ENCOUNTER — Other Ambulatory Visit: Payer: Self-pay

## 2022-02-03 ENCOUNTER — Ambulatory Visit: Payer: Medicaid Other | Attending: Nurse Practitioner | Admitting: Pharmacist

## 2022-02-03 ENCOUNTER — Encounter: Payer: Self-pay | Admitting: Pharmacist

## 2022-02-03 DIAGNOSIS — I10 Essential (primary) hypertension: Secondary | ICD-10-CM | POA: Diagnosis not present

## 2022-02-03 MED ORDER — AMLODIPINE BESYLATE 5 MG PO TABS: 5.0000 mg | ORAL_TABLET | Freq: Every day | ORAL | 1 refills | Status: DC

## 2022-02-03 MED ORDER — CARVEDILOL 12.5 MG PO TABS: 12.5000 mg | ORAL_TABLET | Freq: Two times a day (BID) | ORAL | 1 refills | Status: DC

## 2022-02-17 ENCOUNTER — Encounter: Payer: Medicaid Other | Attending: Physician Assistant | Admitting: Dietician

## 2022-02-17 ENCOUNTER — Encounter: Payer: Self-pay | Admitting: Dietician

## 2022-02-17 DIAGNOSIS — Z6841 Body Mass Index (BMI) 40.0 and over, adult: Secondary | ICD-10-CM | POA: Diagnosis not present

## 2022-02-17 NOTE — Patient Instructions (Addendum)
Look into switching to a prenatal daily multivitamin. ? ?Set an alarm on your phone to eat three times a day. ?Breakfast - 7:00 - 7:30 am ?Lunch - 12:30 - 1:30 pm ?Dinner - 7:00 - 7:30 pm ? ?Use a combination of these three strategies to effectively lower your consumption of high-fat foods ?1) Consume a smaller portion when having a high-fat food ?2) Consume that high-fat food less frequently ?3) Find a reduced, low, or fat-free version of that food ? ?Begin to recognize carbohydrates, proteins, and non-starchy vegetables in your food choices! ? ?Begin to build your meals using the proportions of the Balanced Plate. ?First, select your carb choice(s) for the meal. Make this 25% of your meal. ?Next, select your source of protein to pair with your carb choice(s). Make this another 25% of your meal. ?Finally, complete your meal with a variety of non-starchy vegetables. Make this the remaining 50% of your meal. ? ?Choose fat-free greek yogurt like Danon "Light n' Fit" or Oikos "Triple Zero" ?
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
## 2022-04-02 ENCOUNTER — Other Ambulatory Visit: Payer: Self-pay | Admitting: Pharmacist

## 2022-04-02 MED ORDER — ALBUTEROL SULFATE HFA 108 (90 BASE) MCG/ACT IN AERS: 2.0000 | INHALATION_SPRAY | Freq: Four times a day (QID) | RESPIRATORY_TRACT | 0 refills | Status: AC | PRN

## 2022-04-09 ENCOUNTER — Ambulatory Visit: Payer: Medicaid Other | Admitting: Nurse Practitioner

## 2022-04-30 ENCOUNTER — Ambulatory Visit: Payer: Medicaid Other | Admitting: Dietician

## 2022-05-15 ENCOUNTER — Ambulatory Visit: Payer: Medicaid Other | Admitting: Registered"

## 2022-06-26 ENCOUNTER — Encounter: Payer: Self-pay | Admitting: *Deleted

## 2022-07-01 ENCOUNTER — Telehealth (INDEPENDENT_AMBULATORY_CARE_PROVIDER_SITE_OTHER): Payer: Medicaid Other

## 2022-07-01 DIAGNOSIS — O0990 Supervision of high risk pregnancy, unspecified, unspecified trimester: Secondary | ICD-10-CM

## 2022-07-01 DIAGNOSIS — Z3A00 Weeks of gestation of pregnancy not specified: Secondary | ICD-10-CM

## 2022-07-01 DIAGNOSIS — I10 Essential (primary) hypertension: Secondary | ICD-10-CM

## 2022-07-01 DIAGNOSIS — O10019 Pre-existing essential hypertension complicating pregnancy, unspecified trimester: Secondary | ICD-10-CM

## 2022-07-01 MED ORDER — BLOOD PRESSURE KIT DEVI: 1.0000 | 0 refills | Status: AC | PRN

## 2022-07-01 MED ORDER — GOJJI WEIGHT SCALE MISC: 1.0000 | 0 refills | Status: DC | PRN

## 2022-07-01 MED ORDER — AMLODIPINE BESYLATE 10 MG PO TABS: 10.0000 mg | ORAL_TABLET | Freq: Every day | ORAL | 3 refills | Status: DC

## 2022-07-01 NOTE — Progress Notes (Signed)
New OB Intake  I connected with  Shirley Terrell on 07/01/22 at  8:15 AM EDT by MyChart Video Visit and verified that I am speaking with the correct person using two identifiers. Nurse is located at [facility] and pt is located at home.  I discussed the limitations, risks, security and privacy concerns of performing an evaluation and management service by telephone and the availability of in person appointments. I also discussed with the patient that there may be a patient responsible charge related to this service. The patient expressed understanding and agreed to proceed.  I explained I am completing New OB Intake today. We discussed her EDD of 01/30/2023 that is based on LMP of 04/25/2022. Pt is G4/P2. I reviewed her allergies, medications, Medical/Surgical/OB history, and appropriate screenings. I informed her of [facility] services. [facility] information placed in AVS. Based on history, this is a/an  pregnancy complicated by hypertension .   Patient Active Problem List   Diagnosis Date Noted   Supervision of high risk pregnancy, antepartum 07/01/2022   Anemia 12/05/2021   Screening, lipid 12/05/2021   Epigastric hernia 08/13/2018   Obesity, Class III, BMI 40-49.9 (morbid obesity) (HCC) 04/04/2018   Obstructive sleep apnea syndrome, mild 04/04/2018   Tobacco abuse 04/04/2018   Essential hypertension 12/04/2017   Morbid obesity with BMI of 40.0-44.9, adult (HCC) 12/04/2017    Concerns addressed today: -Patient was on Carvedilol 12.5 mg, hydrochlorothiazide 25 mg, and amlodipine 5 mg for hypertension. Spoke to Dr. Crissie Reese, who suggested to take her off hydrochlorothiazide and go up on amlodipine to 10 mg. Patient is also referred to Cardio OB. Patient is aware they will reach out to her.   Delivery Plans Plans to deliver at [facility]. Patient given information for [facility] website for more information about [facility]. Patient is interested in water birth. Offered upcoming OB  visit with CNM to discuss further.  MyChart/Babyscripts MyChart access verified. I explained pt will have some visits in office and some virtually. Babyscripts instructions given and order placed. Patient verifies receipt of registration text/e-mail. Account successfully created and app downloaded.  Blood Pressure Cuff/Weight Scale Blood pressure cuff ordered for patient to pick-up from [employer]. Explained after first prenatal appt pt will check weekly and document in Babyscripts. Patient does not  have weight scale. Weight scale ordered for patient to pick up from [employer].   Anatomy US Explained first scheduled [place] will be around 19 weeks. Anatomy US scheduled for 09/09/2022 at 10:45 AM. Pt notified to arrive at 10:30 AM.  Labs Discussed Natera genetic screening with patient. Would like both Panorama and Horizon drawn at new OB visit. Routine prenatal labs needed.  Covid Vaccine Patient has covid vaccine.   Is patient a CenteringPregnancy candidate?  Declined Declined due to Schedule/Times    Is patient a Mom+Baby Combined Care candidate?  Not a candidate     Social Determinants of Health Food Insecurity: Patient denies food insecurity. WIC Referral: Patient is interested in referral to [facility].  Transportation: Patient denies transportation needs. Childcare: Discussed no children allowed at ultrasound appointments. Offered childcare services; patient declines childcare services at this time.  First visit review I reviewed new OB appt with pt. I explained she will have a provider visit that includes initial OB labs, genetic screening, pap smear, and pelvic exam. Explained pt will be seen by Emmet Bing MD at first visit; encounter routed to appropriate provider. Explained that patient will be seen by pregnancy  navigator following visit with provider.   Vidal Schwalbe, [place] 07/01/2022  8:39 AM
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
## 2022-07-11 ENCOUNTER — Ambulatory Visit (INDEPENDENT_AMBULATORY_CARE_PROVIDER_SITE_OTHER): Payer: Medicaid Other | Admitting: Cardiology

## 2022-07-11 ENCOUNTER — Encounter: Payer: Self-pay | Admitting: Cardiology

## 2022-07-11 VITALS — BP 113/70 | HR 89 | Ht 63.0 in | Wt 212.7 lb

## 2022-07-11 DIAGNOSIS — I10 Essential (primary) hypertension: Secondary | ICD-10-CM

## 2022-07-11 DIAGNOSIS — G4733 Obstructive sleep apnea (adult) (pediatric): Secondary | ICD-10-CM

## 2022-07-11 DIAGNOSIS — O0990 Supervision of high risk pregnancy, unspecified, unspecified trimester: Secondary | ICD-10-CM | POA: Diagnosis not present

## 2022-07-11 MED ORDER — LABETALOL HCL 100 MG PO TABS: 100.0000 mg | ORAL_TABLET | Freq: Two times a day (BID) | ORAL | 3 refills | Status: DC

## 2022-07-11 NOTE — Patient Instructions (Addendum)
Medication Instructions:  Your physician has recommended you make the following change in your medication:  1.Stop carvedilol 2.Start labetalol 100 mg twice a day *If you need a refill on your cardiac medications before your next appointment, please call your pharmacy*   Lab Work: None If you have labs (blood work) drawn today and your tests are completely normal, you will receive your results only by: MyChart Message (if you have MyChart) OR A paper copy in the mail If you have any lab test that is abnormal or we need to change your treatment, we will call you to review the results.  Follow-Up: At [facility], you and your health needs are our priority.  As part of our continuing mission to provide you with exceptional heart care, we have created designated Provider Care Teams.  These Care Teams include your primary Cardiologist (physician) and Advanced Practice Providers (APPs -  Physician Assistants and Nurse Practitioners) who all work together to provide you with the care you need, when you need it.  Your next appointment:   3 month(s)  The format for your next appointment:   In Person  Provider:   Laurance Flatten, MD {  Other Instructions You have been referred to see our pharmacist Laural Golden in 3 weeks.  Important Information About Sugar

## 2022-07-14 ENCOUNTER — Telehealth: Payer: Self-pay | Admitting: *Deleted
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
## 2022-07-14 NOTE — Telephone Encounter (Signed)
Pt aware that she will need an appt with Dr. Roxy Cedar office to get her CPAP device and equipment replaced.    Advised the pt to call their office today or tomorrow to get an appt with Dr. Maple Hudson, so that all supplies can be replaced appropriately.   Looks like Dr. Maple Hudson saw the pt back in 2019 for sleep study results.  She was referred at that time for a CPAP titration to treat sleep apnea. See note from 06/29/18 in regards to this.   She is aware to call them to get an appt with their office to re-establish and to further manage sleep supplies.    Pt verbalized understanding and agrees with this plan.  Pt states she will give their office a call to get this appt.

## 2022-07-16 ENCOUNTER — Telehealth: Payer: Self-pay | Admitting: *Deleted
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
## 2022-07-24 ENCOUNTER — Encounter (HOSPITAL_COMMUNITY): Payer: Self-pay | Admitting: Obstetrics and Gynecology

## 2022-07-24 ENCOUNTER — Other Ambulatory Visit: Payer: Self-pay

## 2022-07-24 ENCOUNTER — Emergency Department (HOSPITAL_COMMUNITY): Payer: Medicaid Other

## 2022-07-24 ENCOUNTER — Inpatient Hospital Stay (HOSPITAL_COMMUNITY)
Admission: EM | Admit: 2022-07-24 | Discharge: 2022-07-24 | Disposition: A | Discharge status: Home / Self Care | Payer: Medicaid Other | Attending: Obstetrics and Gynecology | Admitting: Obstetrics and Gynecology

## 2022-07-24 DIAGNOSIS — O26891 Other specified pregnancy related conditions, first trimester: Secondary | ICD-10-CM

## 2022-07-24 DIAGNOSIS — H538 Other visual disturbances: Secondary | ICD-10-CM

## 2022-07-24 DIAGNOSIS — Z3A12 12 weeks gestation of pregnancy: Secondary | ICD-10-CM

## 2022-07-24 DIAGNOSIS — O10911 Unspecified pre-existing hypertension complicating pregnancy, first trimester: Secondary | ICD-10-CM | POA: Diagnosis not present

## 2022-07-24 DIAGNOSIS — R519 Headache, unspecified: Secondary | ICD-10-CM | POA: Diagnosis not present

## 2022-07-24 DIAGNOSIS — R102 Pelvic and perineal pain: Secondary | ICD-10-CM | POA: Diagnosis not present

## 2022-07-24 DIAGNOSIS — O10919 Unspecified pre-existing hypertension complicating pregnancy, unspecified trimester: Secondary | ICD-10-CM

## 2022-07-24 HISTORY — DX: Gestational (pregnancy-induced) hypertension without significant proteinuria, unspecified trimester: O13.9

## 2022-07-24 LAB — COMPREHENSIVE METABOLIC PANEL
ALT: 23 U/L (ref 0–44)
AST: 25 U/L (ref 15–41)
Albumin: 3.1 g/dL — ABNORMAL LOW (ref 3.5–5.0)
Alkaline Phosphatase: 63 U/L (ref 38–126)
Anion gap: 4 — ABNORMAL LOW (ref 5–15)
BUN: 7 mg/dL (ref 6–20)
CO2: 23 mmol/L (ref 22–32)
Calcium: 8.9 mg/dL (ref 8.9–10.3)
Chloride: 108 mmol/L (ref 98–111)
Creatinine, Ser: 0.56 mg/dL (ref 0.44–1.00)
GFR, Estimated: >60 mL/min (ref >60)
Glucose, Bld: 95 mg/dL (ref 70–99)
Potassium: 4.2 mmol/L (ref 3.5–5.1)
Sodium: 135 mmol/L (ref 135–145)
Total Bilirubin: 0.8 mg/dL (ref 0.3–1.2)
Total Protein: 6.1 g/dL — ABNORMAL LOW (ref 6.5–8.1)

## 2022-07-24 LAB — URINALYSIS, ROUTINE W REFLEX MICROSCOPIC
Bilirubin Urine: NEGATIVE
Glucose, UA: NEGATIVE mg/dL
Hgb urine dipstick: NEGATIVE
Ketones, ur: NEGATIVE mg/dL
Nitrite: NEGATIVE
Protein, ur: NEGATIVE mg/dL
Specific Gravity, Urine: 1.005 (ref 1.005–1.030)
pH: 7 (ref 5.0–8.0)

## 2022-07-24 LAB — CBC
HCT: 32.1 % — ABNORMAL LOW (ref 36.0–46.0)
Hemoglobin: 10.9 g/dL — ABNORMAL LOW (ref 12.0–15.0)
MCH: 33 pg (ref 26.0–34.0)
MCHC: 34 g/dL (ref 30.0–36.0)
MCV: 97.3 fL (ref 80.0–100.0)
Platelets: 229 10*3/uL (ref 150–400)
RBC: 3.3 MIL/uL — ABNORMAL LOW (ref 3.87–5.11)
RDW: 11.9 % (ref 11.5–15.5)
WBC: 10 10*3/uL (ref 4.0–10.5)
nRBC: 0 % (ref 0.0–0.2)

## 2022-07-24 LAB — WET PREP, GENITAL
Sperm: NONE SEEN
Trich, Wet Prep: NONE SEEN
WBC, Wet Prep HPF POC: >=10 — AB (ref <10)
Yeast Wet Prep HPF POC: NONE SEEN

## 2022-07-24 LAB — ABO/RH: ABO/RH(D): O POS

## 2022-07-24 LAB — PREGNANCY, URINE: Preg Test, Ur: POSITIVE — AB

## 2022-07-24 LAB — HCG, QUANTITATIVE, PREGNANCY: hCG, Beta Chain, Quant, S: 58192 m[IU]/mL — ABNORMAL HIGH (ref <5)

## 2022-07-24 NOTE — MAU Provider Note (Signed)
History     CSN: [id number]  Arrival date and time: 07/24/22 1042   Event Date/Time   First Provider Initiated Contact with Patient 07/24/22 1518      Chief Complaint  Patient presents with   Blurred Vision   Abdominal Pain   Routine Prenatal Visit   36 y.o. [id number] @12 .6 wks transferred from ED for pelvic pain. She was seen there for HA and blurry vision and had negative workup today. 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 Pain started 2 weeks ago. Rates pain 4/10. Has not treated it. Denies VB or discharge. Denies urinary sx other than frequency. Hx of CHTN on 2 medications.    OB History     Gravida  4   Para  2   Term  2   Preterm      AB  1   Living  2      SAB  1   IAB      Ectopic      Multiple  0   Live Births  2           Past Medical History:  Diagnosis Date   Anemia    takes iron supplement   Asthma    prn inhaler   Hypertension    states BP normalized while pregnant and since delivery; is currently not on med.   Lisfranc dislocation 11/30/2016   left   Pregnancy induced hypertension    with 2nd preg   Sleep apnea    uses cpap    Past Surgical History:  Procedure Laterality Date   CHOLECYSTECTOMY  04/13/2012   Procedure: LAPAROSCOPIC CHOLECYSTECTOMY WITH INTRAOPERATIVE CHOLANGIOGRAM;  Surgeon: Odis Hollingshead, MD;  Location: [place];  Service: General;  Laterality: N/A;  laparoscopic cholecystectomy with IOC   INCISIONAL HERNIA REPAIR  08/13/2018   LAPROSCOPIC   INCISIONAL HERNIA REPAIR N/A 08/13/2018   Procedure: LAPAROSCOPIC INCISIONAL HERNIA REPAIR;  Surgeon: Clovis Riley, MD;  Location: [place];  Service: General;  Laterality: N/A;   INSERTION OF MESH N/A 08/13/2018   Procedure: INSERTION OF MESH;  Surgeon: Clovis Riley, MD;  Location: [place];  Service: General;  Laterality: N/A;   OPEN REDUCTION INTERNAL FIXATION (ORIF) FOOT LISFRANC FRACTURE Left 12/18/2016   Procedure: OPEN REDUCTION INTERNAL  FIXATION (ORIF) FOOT LISFRANC FRACTURE;  Surgeon: Wylene Simmer, MD;  Location: [place];  Service: Orthopedics;  Laterality: Left;    Family History  Problem Relation Age of Onset   Hypertension Mother    Stroke Mother    Renal Disease Mother        due to blood pressure   Heart disease Father        MI, no stents   Depression Father    Hypertension Father    Stroke Father    Cancer Paternal Grandmother        breast   Diabetes Paternal Grandmother    Cancer Paternal Grandfather    Hypertension Maternal Grandmother    Diabetes Other    Healthy Sister    Healthy Sister    Healthy Sister    Healthy Sister     Social History   Tobacco Use   Smoking status: Former    Packs/day: 0.00    Years: 3.00    Total pack years: 0.00    Types: Cigarettes    Quit date: 12/14/2017    Years since quitting: 4.6   Smokeless tobacco: Never   Tobacco comments:  6 cigarettes/day  Vaping Use   Vaping Use: Never used  Substance Use Topics   Alcohol use: Not Currently    Comment: occasionally   Drug use: No    Allergies: No Known Allergies  Medications Prior to Admission  Medication Sig Dispense Refill Last Dose   amLODipine (NORVASC) 10 MG tablet Take 1 tablet (10 mg total) by mouth daily. 30 tablet 3 07/24/2022   labetalol (NORMODYNE) 100 MG tablet Take 1 tablet (100 mg total) by mouth 2 (two) times daily. 180 tablet 3 07/24/2022   Prenatal Vit-Fe Fumarate-FA (PRENATAL VITAMINS PO) Take 1 tablet by mouth daily.   07/24/2022   albuterol (VENTOLIN HFA) 108 (90 Base) MCG/ACT inhaler Inhale 2 puffs into the lungs every 6 (six) hours as needed for wheezing. 18 g 0 More than a month   Blood Pressure Monitor DEVI Please provide patient with insurance approved blood pressure monitor 1 Device 0    Blood Pressure Monitoring (BLOOD PRESSURE KIT) DEVI 1 Device by Does not apply route as needed. 1 each 0    Misc. Devices (GOJJI WEIGHT SCALE) MISC 1 Device by Does not apply route as  needed. 1 each 0     Review of Systems  Eyes:  Positive for visual disturbance.  Gastrointestinal:  Negative for abdominal pain.  Genitourinary:  Positive for frequency and pelvic pain. Negative for dysuria, hematuria, urgency, vaginal bleeding and vaginal discharge.  Neurological:  Positive for headaches.   Physical Exam   Blood pressure 116/60, pulse 84, temperature 98.3 F (36.8 C), temperature source Oral, resp. rate 18, height 5' 3"  (1.6 m), weight 96.2 kg, last menstrual period 04/25/2022, SpO2 100 %, unknown if currently breastfeeding.  Physical Exam Vitals and nursing note reviewed.  Constitutional:      General: She is not in acute distress.    Appearance: Normal appearance.  HENT:     Head: Normocephalic and atraumatic.  Cardiovascular:     Rate and Rhythm: Normal rate.  Pulmonary:     Effort: Pulmonary effort is normal. No respiratory distress.  Musculoskeletal:        General: Normal range of motion.     Cervical back: Normal range of motion.  Neurological:     General: No focal deficit present.     Mental Status: She is alert and oriented to person, place, and time.  Psychiatric:        Mood and Affect: Mood normal.        Behavior: Behavior normal.    Results for orders placed or performed during the hospital encounter of 07/24/22 (from the past 24 hour(s))  CBC     Status: Abnormal   Collection Time: 07/24/22 11:40 AM  Result Value Ref Range   WBC 10.0 4.0 - 10.5 K/uL   RBC 3.30 (L) 3.87 - 5.11 MIL/uL   Hemoglobin 10.9 (L) 12.0 - 15.0 g/dL   HCT 32.1 (L) 36.0 - 46.0 %   MCV 97.3 80.0 - 100.0 fL   MCH 33.0 26.0 - 34.0 pg   MCHC 34.0 30.0 - 36.0 g/dL   RDW 11.9 11.5 - 15.5 %   Platelets 229 150 - 400 K/uL   nRBC 0.0 0.0 - 0.2 %  Comprehensive metabolic panel     Status: Abnormal   Collection Time: 07/24/22 11:40 AM  Result Value Ref Range   Sodium 135 135 - 145 mmol/L   Potassium 4.2 3.5 - 5.1 mmol/L   Chloride 108 98 - 111 mmol/L   CO2  23 22 - 32  mmol/L   Glucose, Bld 95 70 - 99 mg/dL   BUN 7 6 - 20 mg/dL   Creatinine, Ser 0.56 0.44 - 1.00 mg/dL   Calcium 8.9 8.9 - 10.3 mg/dL   Total Protein 6.1 (L) 6.5 - 8.1 g/dL   Albumin 3.1 (L) 3.5 - 5.0 g/dL   AST 25 15 - 41 U/L   ALT 23 0 - 44 U/L   Alkaline Phosphatase 63 38 - 126 U/L   Total Bilirubin 0.8 0.3 - 1.2 mg/dL   GFR, Estimated >60 >60 mL/min   Anion gap 4 (L) 5 - 15  Urinalysis, Routine w reflex microscopic     Status: Abnormal   Collection Time: 07/24/22 11:45 AM  Result Value Ref Range   Color, Urine YELLOW YELLOW   APPearance CLOUDY (A) CLEAR   Specific Gravity, Urine 1.005 1.005 - 1.030   pH 7.0 5.0 - 8.0   Glucose, UA NEGATIVE NEGATIVE mg/dL   Hgb urine dipstick NEGATIVE NEGATIVE   Bilirubin Urine NEGATIVE NEGATIVE   Ketones, ur NEGATIVE NEGATIVE mg/dL   Protein, ur NEGATIVE NEGATIVE mg/dL   Nitrite NEGATIVE NEGATIVE   Leukocytes,Ua SMALL (A) NEGATIVE   RBC / HPF 0-5 0 - 5 RBC/hpf   WBC, UA 6-10 0 - 5 WBC/hpf   Bacteria, UA MANY (A) NONE SEEN   Squamous Epithelial / LPF 21-50 0 - 5  Pregnancy, urine     Status: Abnormal   Collection Time: 07/24/22 11:45 AM  Result Value Ref Range   Preg Test, Ur POSITIVE (A) NEGATIVE  ABO/Rh     Status: None   Collection Time: 07/24/22  2:10 PM  Result Value Ref Range   ABO/RH(D) O POS    No rh immune globuloin      NOT A RH IMMUNE GLOBULIN CANDIDATE, PT RH POSITIVE Performed at [facility], [place] [address] [place], [place] [address]   Wet prep, genital     Status: Abnormal   Collection Time: 07/24/22  2:52 PM   Specimen: Vaginal  Result Value Ref Range   Yeast Wet Prep HPF POC NONE SEEN NONE SEEN   Trich, Wet Prep NONE SEEN NONE SEEN   Clue Cells Wet Prep HPF POC PRESENT (A) NONE SEEN   WBC, Wet Prep HPF POC >=10 (A) <10   Sperm NONE SEEN    US OB Comp < 14 Wks  Result Date: 07/24/2022 CLINICAL DATA:  Pelvic pain. LMP was 04/25/2022. By LMP EDC is 01/30/2023. Patient is 12 weeks 6 days by LMP. EXAM:  OBSTETRIC <14 WK ULTRASOUND TECHNIQUE: Transabdominal ultrasound was performed for evaluation of the gestation as well as the maternal uterus and adnexal regions. COMPARISON:  None Available. FINDINGS: Intrauterine gestational sac: Single Yolk sac:  Not Visualized. Embryo:  Visualized. Cardiac Activity: Visualized. Heart Rate: 171 bpm CRL:   63.14 mm   12 w 5 d                  [place] EDC: 01/31/2023 Subchorionic hemorrhage:  None visualized. Maternal uterus/adnexae: Normal appearance of both ovaries. Possible fibroid noted in the RIGHT mid uterus, measuring 1.9 x 1.2 x 1.9 centimeters. No free pelvic fluid. IMPRESSION: 1. Single living intrauterine embryo measuring 12 weeks 5 days and correlating well with clinical EDC of 01/30/2023. 2. Possible small fibroid measuring 1.9 centimeters in the RIGHT mid uterus. Electronically Signed   By: Nolon Nations M.D.   On: 07/24/2022 13:44   CT  Head Wo Contrast  Result Date: 07/24/2022 CLINICAL DATA:  Headaches, blurred vision EXAM: CT HEAD WITHOUT CONTRAST TECHNIQUE: Contiguous axial images were obtained from the base of the skull through the vertex without intravenous contrast. RADIATION DOSE REDUCTION: This exam was performed according to the departmental dose-optimization program which includes automated exposure control, adjustment of the mA and/or kV according to patient size and/or use of iterative reconstruction technique. COMPARISON:  None Available. FINDINGS: Brain: No acute intracranial findings are seen. There are no signs of bleeding within the cranium. Ventricles are not dilated. There is no focal edema or mass effect. Vascular: Unremarkable. Skull: Unremarkable. Sinuses/Orbits: There is mucosal thickening in ethmoid sinus. 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 Other: None. IMPRESSION: No acute intracranial findings are seen in noncontrast CT brain. Electronically Signed   By: Elmer Picker M.D.   On: 07/24/2022 13:39    MAU Course  Procedures  MDM Labs and  [place] reviewed. UA with many bacteria although likely contaminated, pt asymptomatic, culture ordered. No signs of pelvic infection, GC pending. Normal viable IUP on [place] consistent with dates. Pain likely physiologic to pregnancy, recommend comfort measures. Stable for discharge home.  Assessment and Plan   1. [redacted] weeks gestation of pregnancy   2. Blurred vision   3. Chronic hypertension affecting pregnancy   4. Pelvic pain affecting pregnancy in first trimester, antepartum    Discharge home Follow up at [facility] as scheduled Return precautions  Allergies as of 07/24/2022   No Known Allergies      Medication List     TAKE these medications    albuterol 108 (90 Base) MCG/ACT inhaler Commonly known as: VENTOLIN HFA Inhale 2 puffs into the lungs every 6 (six) hours as needed for wheezing.   amLODipine 10 MG tablet Commonly known as: NORVASC Take 1 tablet (10 mg total) by mouth daily.   Blood Pressure Monitor Devi Please provide patient with insurance approved blood pressure monitor   Blood Pressure Kit Devi 1 Device by Does not apply route as needed.   Gojji Weight Scale Misc 1 Device by Does not apply route as needed.   labetalol 100 MG tablet Commonly known as: NORMODYNE Take 1 tablet (100 mg total) by mouth 2 (two) times daily.   PRENATAL VITAMINS PO Take 1 tablet by mouth daily.         Julianne Handler, CNM 07/24/2022, 3:24 PM

## 2022-07-24 NOTE — ED Triage Notes (Addendum)
Pt. Stated, things are spotted and blurry with my eyes this morning. New onset Pregnant and ive had pelvic pain for 2 weeks. No prenatal care.

## 2022-07-24 NOTE — MAU Note (Addendum)
Shirley Terrell is a 36 y.o. 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  Still having some blurred vision, states comes and goes, not as bad as it was earlier. Wears glasses, has not been to an eye dr in a couple of years. Blurring is causing a headache. No bleeding or d/c.  Onset of complaint: vision started this morning. Pain score: pelvic 4, HA 7 Vitals:   07/24/22 1049  BP: 105/69  Pulse: 91  Resp: 16  Temp: 98.3 F (36.8 C)  SpO2: 95%      Lab orders placed from triage:  per APP  Also has not eaten since 1800 last night.  Crackers, PB and juice given in triage

## 2022-07-24 NOTE — ED Provider Triage Note (Addendum)
Emergency Medicine Provider Triage Evaluation Note  Shirley Terrell , a 36 y.o. female  was evaluated in triage.  Pt complains of blurred vision and headache. She states that she is [redacted] weeks pregnant with her 3rd pregnancy and has not seen her OB/GYN yet. She states that she went to sleep at 7 pm last night and was asymptomatic and when she woke up this morning at 5:30 am she was having blurred vision and a headache. She states that her headache is in her frontal region and is consistent with headaches she gets when her blood pressure is high. However, she states that when she checked her bp it was normal which was concerning for her. States she has never had blurred vision before. Endorses photophobia as well.   Additionally, she states that she has had pelvic pain for the past 2 weeks which has been unchanged. Denies n/v/d, vaginal bleeding, or vaginal discharge  Review of Systems  Positive:  Negative:   Physical Exam  BP 105/69 (BP Location: Right Arm)   Pulse 91   Temp 98.3 F (36.8 C) (Oral)   Resp 16   Ht 5\' 3"  (1.6 m)   Wt 96.2 kg   LMP 04/25/2022   SpO2 95%   BMI 37.55 kg/m  Gen:   Awake, no distress   Resp:  Normal effort  MSK:   Moves extremities without difficulty  Other:  Alert and oriented and neurologically intact without focal deficits. PERRLA  Medical Decision Making  Medically screening exam initiated at 11:30 AM.  Appropriate orders placed.  JHANA GIARRATANO was informed that the remainder of the evaluation will be completed by another provider, this initial triage assessment does not replace that evaluation, and the importance of remaining in the ED until their evaluation is complete.     [name] 07/24/22 1134    Gaia Gullikson, 09/23/22, PA-C 07/24/22 1135

## 2022-07-26 LAB — CULTURE, OB URINE

## 2022-07-28 ENCOUNTER — Other Ambulatory Visit (HOSPITAL_COMMUNITY)
Admission: RE | Admit: 2022-07-28 | Discharge: 2022-07-28 | Disposition: A | Discharge status: Home / Self Care | Payer: Medicaid Other | Source: Ambulatory Visit | Attending: Obstetrics and Gynecology | Admitting: Obstetrics and Gynecology

## 2022-07-28 ENCOUNTER — Other Ambulatory Visit: Payer: Self-pay

## 2022-07-28 ENCOUNTER — Ambulatory Visit (INDEPENDENT_AMBULATORY_CARE_PROVIDER_SITE_OTHER): Payer: Medicaid Other | Admitting: Obstetrics and Gynecology

## 2022-07-28 ENCOUNTER — Encounter: Payer: Self-pay | Admitting: Obstetrics and Gynecology

## 2022-07-28 VITALS — BP 103/70 | HR 89 | Wt 212.9 lb

## 2022-07-28 DIAGNOSIS — O0990 Supervision of high risk pregnancy, unspecified, unspecified trimester: Secondary | ICD-10-CM | POA: Diagnosis present

## 2022-07-28 DIAGNOSIS — O10919 Unspecified pre-existing hypertension complicating pregnancy, unspecified trimester: Secondary | ICD-10-CM

## 2022-07-28 DIAGNOSIS — G4733 Obstructive sleep apnea (adult) (pediatric): Secondary | ICD-10-CM

## 2022-07-28 DIAGNOSIS — O99211 Obesity complicating pregnancy, first trimester: Secondary | ICD-10-CM

## 2022-07-28 DIAGNOSIS — Z3A13 13 weeks gestation of pregnancy: Secondary | ICD-10-CM

## 2022-07-28 DIAGNOSIS — K439 Ventral hernia without obstruction or gangrene: Secondary | ICD-10-CM

## 2022-07-28 LAB — GC/CHLAMYDIA PROBE AMP (~~LOC~~) NOT AT ARMC
Chlamydia: NEGATIVE
Comment: NEGATIVE
Comment: NORMAL
Neisseria Gonorrhea: NEGATIVE

## 2022-07-28 MED ORDER — ASPIRIN 81 MG PO TBEC: 81.0000 mg | DELAYED_RELEASE_TABLET | Freq: Every day | ORAL | 1 refills | Status: DC

## 2022-07-28 NOTE — Progress Notes (Signed)
New OB Note  07/28/2022   Clinic: [facility]  Chief Complaint: new OB  Transfer of Care Patient: no  History of Present Illness: [name] is a 36 y.o. [id number] @ 13/3 weeks (EDC 3/15, based on Patient's last menstrual period was 04/25/2022.=12wk u/s).  Preg complicated by has Chronic hypertension during pregnancy, antepartum; Essential hypertension; Obesity affecting pregnancy in first trimester; Obesity, Class III, BMI 40-49.9 (morbid obesity) (HCC); Obstructive sleep apnea syndrome, mild; Tobacco abuse; S/p umbilical hernia mesh repair; Anemia; and Supervision of high risk pregnancy, antepartum on their problem list.  No OB s/s today  ROS: A 12-point review of systems was performed and negative, except as stated in the above HPI.  OBGYN History: As per HPI. OB History  Gravida Para Term Preterm AB Living  4 2 2   1 2   SAB IAB Ectopic Multiple Live Births  1     0 2    # Outcome Date GA Lbr Len/2nd Weight Sex Delivery Anes PTL Lv  4 Current           3 Term 07/22/16 [redacted]w[redacted]d 08:32 / 00:13 7 lb 3.7 oz (3.28 kg) F Vag-Spont EPI  LIV  2 Term 01/14/09 [redacted]w[redacted]d  7 lb 9 oz (3.43 kg) F Vag-Spont EPI N LIV  1 SAB  [redacted]w[redacted]d            Past Medical History: Past Medical History:  Diagnosis Date   Anemia    takes iron supplement   Asthma    prn inhaler   Hypertension    states BP normalized while pregnant and since delivery; is currently not on med.   Lisfranc dislocation 11/30/2016   left   Pregnancy induced hypertension    with 2nd preg   Sleep apnea    uses cpap    Past Surgical History: Past Surgical History:  Procedure Laterality Date   CHOLECYSTECTOMY  04/13/2012   Procedure: LAPAROSCOPIC CHOLECYSTECTOMY WITH INTRAOPERATIVE CHOLANGIOGRAM;  Surgeon: 04/15/2012, MD;  Location: [facility] OR;  Service: General;  Laterality: N/A;  laparoscopic cholecystectomy with IOC   INCISIONAL HERNIA REPAIR  08/13/2018   LAPROSCOPIC   INCISIONAL HERNIA  REPAIR N/A 08/13/2018   Procedure: LAPAROSCOPIC INCISIONAL HERNIA REPAIR;  Surgeon: 08/15/2018, MD;  Location: [facility] OR;  Service: General;  Laterality: N/A;   INSERTION OF MESH N/A 08/13/2018   Procedure: INSERTION OF MESH;  Surgeon: 08/15/2018, MD;  Location: [facility] OR;  Service: General;  Laterality: N/A;   OPEN REDUCTION INTERNAL FIXATION (ORIF) FOOT LISFRANC FRACTURE Left 12/18/2016   Procedure: OPEN REDUCTION INTERNAL FIXATION (ORIF) FOOT LISFRANC FRACTURE;  Surgeon: 02/15/2017, MD;  Location: [facility];  Service: Orthopedics;  Laterality: Left;    Family History:  Family History  Problem Relation Age of Onset   Hypertension Mother    Stroke Mother    Renal Disease Mother        due to blood pressure   Heart disease Father        MI, no stents   Depression Father    Hypertension Father    Stroke Father    Cancer Paternal Grandmother        breast   Diabetes Paternal Grandmother    Cancer Paternal Grandfather    Hypertension Maternal Grandmother    Diabetes Other    Healthy Sister    Healthy Sister    Healthy Sister    Healthy Sister  Social History:  Social History   Socioeconomic History   Marital status: Single    Spouse name: Not on file   Number of children: Not on file   Years of education: Not on file   Highest education level: Not on file  Occupational History   Not on file  Tobacco Use   Smoking status: Former    Packs/day: 0.00    Years: 3.00    Total pack years: 0.00    Types: Cigarettes    Quit date: 12/14/2017    Years since quitting: 4.6   Smokeless tobacco: Never   Tobacco comments:    6 cigarettes/day  Vaping Use   Vaping Use: Never used  Substance and Sexual Activity   Alcohol use: Not Currently    Comment: occasionally   Drug use: No   Sexual activity: Yes    Partners: Male    Birth control/protection: None  Other Topics Concern   Not on file  Social History Narrative   She is single with 2 daughters, age 50  yrs and 1 yr in 2019.    Social Determinants of Health   Financial Resource Strain: Not on file  Food Insecurity: Not on file  Transportation Needs: Not on file  Physical Activity: Not on file  Stress: Not on file  Social Connections: Not on file  Intimate Partner Violence: Not on file    Allergy: No Known Allergies   Current Outpatient Medications: Labetalol 100 bid Norvasc 10 qday Prenatal vitamin  Physical Exam:   BP 103/70   Pulse 89   Wt 212 lb 14.4 oz (96.6 kg)   LMP 04/25/2022   BMI 37.71 kg/m  Body mass index is 37.71 kg/m. Contractions: Not present Vag. Bleeding: None. Fundal height: not applicable FHTs: 140s on bedside u/s  General appearance: Well nourished, well developed female in no acute distress.  Neck:  Supple, normal appearance, and no thyromegaly  Cardiovascular: S1, S2 normal, no murmur, rub or gallop, regular rate and rhythm Respiratory:  Clear to auscultation bilateral. Normal respiratory effort Abdomen: positive bowel sounds and no masses, hernias; diffusely non tender to palpation, non distended Breasts: patient denies any s/s. Neuro/Psych:  Normal mood and affect.  Skin:  Warm and dry.  Lymphatic:  No inguinal lymphadenopathy.   Pelvic exam: is not limited by body habitus EGBUS: within normal limits, Vagina: within normal limits and with no blood in the vault, Cervix: normal appearing cervix without discharge or lesions, closed/long/high, Uterus:  c/w 12-14wk sized, and Adnexa:  normal adnexa and no mass, fullness, tenderness  Laboratory: No new labs  Imaging:  No new imaging  Assessment: patient doing well  Plan: 1. Supervision of high risk pregnancy, antepartum Offer afp next visit Already scheduled for anatomy u/s Pt amenable to starting low dose ASA - Culture, OB Urine - Comprehensive metabolic panel - CBC/D/Plt+RPR+Rh+ABO+RubIgG... - Hemoglobin A1c - Cytology - PAP( So-Hi) - Panorama Prenatal Test Full Panel -  [place] Custom - TSH Rfx on Abnormal to Free T4 - Protein / creatinine ratio, urine  2. [redacted] weeks gestation of pregnancy  3. Chronic hypertension during pregnancy, antepartum Seen by cardiology and meds adjusted last month. Continue on current regimen Qwk testing at 32wks, q4wk growth u/s after anatomy u/s, delivery at 37-39wks  4. Obstructive sleep apnea syndrome, mild Patient has appt on 9/21 with Dr. Maple Hudson for another cpap machine. Importance of using this stressed to her  5. S/p umbilical hernia mesh repair Patient wants BTL. I  told her that will only be able to do it if she needs a c-section for some reason or as an interval BTL since postpartum BTL is typically done where her mesh in 2019 was placed. Sign BTL papers at 28wks  6. Obesity, Class III, BMI 40-49.9 (morbid obesity) (HCC)  7. Obesity affecting pregnancy in first trimester  Problem list reviewed and updated.  Follow up in 3 weeks.  The nature of [facility] with multiple MDs and other Advanced Practice Providers was explained to patient; also emphasized that residents, students are part of our team.  >50% of 35 min visit spent on counseling and coordination of care.     Cornelia Copa MD Attending Center for [facility])

## 2022-07-29 LAB — CBC/D/PLT+RPR+RH+ABO+RUBIGG...
Antibody Screen: NEGATIVE
Basophils Absolute: 0.1 10*3/uL (ref 0.0–0.2)
Basos: 1 %
EOS (ABSOLUTE): 0.3 10*3/uL (ref 0.0–0.4)
Eos: 3 %
HCV Ab: NONREACTIVE
HIV Screen 4th Generation wRfx: NONREACTIVE
Hematocrit: 33.1 % — ABNORMAL LOW (ref 34.0–46.6)
Hemoglobin: 11.1 g/dL (ref 11.1–15.9)
Hepatitis B Surface Ag: NEGATIVE
Immature Grans (Abs): 0 10*3/uL (ref 0.0–0.1)
Immature Granulocytes: 0 %
Lymphocytes Absolute: 1.3 10*3/uL (ref 0.7–3.1)
Lymphs: 12 %
MCH: 31.9 pg (ref 26.6–33.0)
MCHC: 33.5 g/dL (ref 31.5–35.7)
MCV: 95 fL (ref 79–97)
Monocytes Absolute: 0.5 10*3/uL (ref 0.1–0.9)
Monocytes: 5 %
Neutrophils Absolute: 8.6 10*3/uL — ABNORMAL HIGH (ref 1.4–7.0)
Neutrophils: 79 %
Platelets: 233 10*3/uL (ref 150–450)
RBC: 3.48 x10E6/uL — ABNORMAL LOW (ref 3.77–5.28)
RDW: 11.7 % (ref 11.7–15.4)
RPR Ser Ql: NONREACTIVE
Rh Factor: POSITIVE
Rubella Antibodies, IGG: 1.2 index (ref >0.99)
WBC: 10.8 10*3/uL (ref 3.4–10.8)

## 2022-07-29 LAB — PROTEIN / CREATININE RATIO, URINE
Creatinine, Urine: 46.8 mg/dL
Protein, Ur: 5.4 mg/dL
Protein/Creat Ratio: 115 mg/g creat (ref 0–200)

## 2022-07-29 LAB — HCV INTERPRETATION

## 2022-07-29 LAB — COMPREHENSIVE METABOLIC PANEL
ALT: 42 IU/L — ABNORMAL HIGH (ref 0–32)
AST: 28 IU/L (ref 0–40)
Albumin/Globulin Ratio: 1.7 (ref 1.2–2.2)
Albumin: 3.8 g/dL — ABNORMAL LOW (ref 3.9–4.9)
Alkaline Phosphatase: 79 IU/L (ref 44–121)
BUN/Creatinine Ratio: 8 — ABNORMAL LOW (ref 9–23)
BUN: 4 mg/dL — ABNORMAL LOW (ref 6–20)
Bilirubin Total: 0.4 mg/dL (ref 0.0–1.2)
CO2: 21 mmol/L (ref 20–29)
Calcium: 9.2 mg/dL (ref 8.7–10.2)
Chloride: 108 mmol/L — ABNORMAL HIGH (ref 96–106)
Creatinine, Ser: 0.53 mg/dL — ABNORMAL LOW (ref 0.57–1.00)
Globulin, Total: 2.3 g/dL (ref 1.5–4.5)
Glucose: 84 mg/dL (ref 70–99)
Potassium: 4 mmol/L (ref 3.5–5.2)
Sodium: 141 mmol/L (ref 134–144)
Total Protein: 6.1 g/dL (ref 6.0–8.5)
eGFR: 124 mL/min/{1.73_m2} (ref >59)

## 2022-07-29 LAB — HEMOGLOBIN A1C
Est. average glucose Bld gHb Est-mCnc: 97 mg/dL
Hgb A1c MFr Bld: 5 % (ref 4.8–5.6)

## 2022-07-29 LAB — TSH RFX ON ABNORMAL TO FREE T4: TSH: 2.03 u[IU]/mL (ref 0.450–4.500)

## 2022-07-30 LAB — CULTURE, OB URINE

## 2022-07-30 LAB — URINE CULTURE, OB REFLEX

## 2022-07-31 ENCOUNTER — Encounter: Payer: Self-pay | Admitting: Obstetrics and Gynecology

## 2022-07-31 DIAGNOSIS — R7401 Elevation of levels of liver transaminase levels: Secondary | ICD-10-CM | POA: Insufficient documentation

## 2022-08-01 ENCOUNTER — Encounter: Payer: Self-pay | Admitting: Pharmacist

## 2022-08-01 ENCOUNTER — Telehealth: Payer: Self-pay

## 2022-08-01 ENCOUNTER — Ambulatory Visit: Payer: Medicaid Other | Attending: Internal Medicine | Admitting: Pharmacist

## 2022-08-01 VITALS — BP 104/72 | HR 87 | Wt 215.4 lb

## 2022-08-01 DIAGNOSIS — O10919 Unspecified pre-existing hypertension complicating pregnancy, unspecified trimester: Secondary | ICD-10-CM | POA: Diagnosis not present

## 2022-08-01 NOTE — Patient Instructions (Addendum)
It was nice meeting you today  Your blood pressure is well controlled today at 104/72  Please continue your amlodipine 10mg  daily and labetalol 100mg  twice a day  I will give you some aspirin samples today.  You will take 1 tablet once a day  Please call or message with any questions or if your blood pressure begins to trend low or high  , PharmD, BCACP, CDCES, CPP [address], [address] [place], [address], [place] Phone: [phone], Fax: [phone]

## 2022-08-01 NOTE — Telephone Encounter (Signed)
Pt left voicemail on nurse line that she would like explanation of results from prenatal testing. RN called pt and reviewed testing with her. Pt was concerned d/t some of her levels on her CBC showed they were out of range. RN explained that even though levels were below range criteria they were OK. Pt verbalized understanding and denied any further questions.

## 2022-08-01 NOTE — Progress Notes (Signed)
Patient ID: Shirley Terrell                 DOB: [birth year]                      MRN: [id number]     HPI: Shirley Terrell is a 36 y.o. female referred by Dr. Johney Frame for cardio/obstetrics HTN management. PMH is significant for obesity, OSA, and hypertension during pregnancy.  Patient is 14w 0d pregnant based on LMP of 04/25/22.   Patient presents today with daughter and friend. Currently taking amlodipine 74m daily and labetalol 1020mBID. Reports no adverse effects. Has been checking BP twice daily at home and reports it has been well controlled.  Uses upper arm cuff and understands proper technique.  Denies chest pain and dizziness. Has occasional headaches. Was seen in ER for blurry vision on 9/7 but has since resolved. Has occasional swelling in left lower extremity which she relates to being busy at work. Reports she has not been following a diet.  Has not started aspirin yet, forgot to pick up.  Current HTN meds:   Labetalol 10011mID Amlodiine 80m40mily   Wt Readings from Last 3 Encounters:  07/28/22 212 lb 14.4 oz (96.6 kg)  07/24/22 212 lb (96.2 kg)  07/11/22 212 lb 11.2 oz (96.5 kg)   BP Readings from Last 3 Encounters:  07/28/22 103/70  07/24/22 116/60  07/11/22 113/70   Pulse Readings from Last 3 Encounters:  07/28/22 89  07/24/22 84  07/11/22 89    Renal function: Estimated Creatinine Clearance: 109.7 mL/min (A) (by C-G formula based on SCr of 0.53 mg/dL (L)).  Past Medical History:  Diagnosis Date   Anemia    takes iron supplement   Asthma    prn inhaler   Hypertension    states BP normalized while pregnant and since delivery; is currently not on med.   Lisfranc dislocation 11/30/2016   left   Pregnancy induced hypertension    with 2nd preg   Sleep apnea    uses cpap    Current Outpatient Medications on File Prior to Visit  Medication Sig Dispense Refill   albuterol (VENTOLIN HFA) 108 (90 Base) MCG/ACT inhaler Inhale 2 puffs into the lungs  every 6 (six) hours as needed for wheezing. 18 g 0   amLODipine (NORVASC) 10 MG tablet Take 1 tablet (10 mg total) by mouth daily. 30 tablet 3   aspirin EC 81 MG tablet Take 1 tablet (81 mg total) by mouth daily. 60 tablet 1   Blood Pressure Monitor DEVI Please provide patient with insurance approved blood pressure monitor 1 Device 0   Blood Pressure Monitoring (BLOOD PRESSURE KIT) DEVI 1 Device by Does not apply route as needed. 1 each 0   labetalol (NORMODYNE) 100 MG tablet Take 1 tablet (100 mg total) by mouth 2 (two) times daily. 180 tablet 3   Misc. Devices (GOJJI WEIGHT SCALE) MISC 1 Device by Does not apply route as needed. (Patient not taking: Reported on 07/28/2022) 1 each 0   Prenatal Vit-Fe Fumarate-FA (PRENATAL VITAMINS PO) Take 1 tablet by mouth daily.     No current facility-administered medications on file prior to visit.    No Known Allergies   Assessment/Plan:  1. Hypertension -  Patient BP 94/69 on right arm and rechecked at 104/72 on left arm. Recommended patient continue to check twice daily at home and contact clinic if it begins to trend low. May need  dose reduction. She reports no hypotensive symptoms and reports she feels well.  Gave patient aspirin samples and educated on why it is used. Patient appreciative. Has follow up scheduled for December with Dr Johney Frame.    No medication changes needed at this time. Patient knows to contact clinic if BP begins to trend low or high. Patient does not think she needs to schedule follow up at this time.  Continue labetalol 156m BID Continue amlodipine 163mdaily Recheck as needed  ChKarren CobblePharmD, BCWood VillageCDHarcourtCPNorth St. Paul[id number]: [phone]: [phone]

## 2022-08-02 LAB — PANORAMA PRENATAL TEST FULL PANEL:PANORAMA TEST PLUS 5 ADDITIONAL MICRODELETIONS: FETAL FRACTION: 5.5

## 2022-08-03 LAB — HORIZON CUSTOM: REPORT SUMMARY: NEGATIVE

## 2022-08-04 LAB — CYTOLOGY - PAP
Chlamydia: NEGATIVE
Comment: NEGATIVE
Comment: NEGATIVE
Comment: NEGATIVE
Comment: NEGATIVE
Comment: NORMAL
Diagnosis: UNDETERMINED — AB
HPV 16: NEGATIVE
HPV 18 / 45: NEGATIVE
High risk HPV: POSITIVE — AB
Neisseria Gonorrhea: NEGATIVE

## 2022-08-05 ENCOUNTER — Encounter: Payer: Medicaid Other | Attending: Physician Assistant | Admitting: Registered"

## 2022-08-05 ENCOUNTER — Encounter: Payer: Self-pay | Admitting: Registered"

## 2022-08-05 DIAGNOSIS — E669 Obesity, unspecified: Secondary | ICD-10-CM | POA: Insufficient documentation

## 2022-08-05 NOTE — Patient Instructions (Signed)
Instructions/Goals:   Goal #1: Eat every 3-4 hours while awake:  Set timer to stop to eat within 1 hour of waking and then 4-5 hours for meals.  If meal is spaced 5 or more hours apart recommend a snack   Water Goal: 80 oz daily or more-Doing great!   Make physical activity a part of your week. Try to include at least 30 minutes of physical activity 5 days each week or at least 150 minutes per week. Regular physical activity promotes overall health-including helping to reduce risk for heart disease and diabetes, promoting mental health, and helping [place] sleep better.   Doing great! Continue!    Continue with prenatal multivitamin. Recommend asking your doctor about your most recent hemoglobin level.

## 2022-08-05 NOTE — Progress Notes (Signed)
Medical Nutrition Therapy  Appointment Start time:  9678  Appointment End time:  61  Primary concerns today: Pt wants to ensure she is eating well for self during pregnancy and also to be a good role model for her children.  Referral diagnosis: Obesity. Pt is currently [redacted] weeks pregnant.  Preferred learning style: no preference indicated Learning readiness: ready   NUTRITION ASSESSMENT   Anthropometrics  Weight: 213 lb 9.6 oz Height: 5\' 3"   Clinical Medical Hx: Obesity Medications: See list.  Labs: See chart.  Notable Signs/Symptoms: None reported.   Lifestyle & Dietary Hx Pt last saw Billey Gosling, RDN, LDN in April 2023. Reports since that time she has become pregnant and is 14 weeks currently. Reports her blood pressure medication as been adjusted but everything has been going well. Would like to make sure she is doing what is best for everyone nutritionally. Pt reports before she became pregnant struggled with eating consistently. Reports she tries to eat when hungry rather than at certain times but is unsure if that is best. Reports craving more vegetables lately. Reports no morning sickness or other GI issues. Feels this baby is a boy due to pregnancy being different than with other children who were girls. Reports her boss put her on leave and she has been trying to find something to do. Reports walking her dogs a lot-pitbull and dachshund yorkie mix.   Estimated daily fluid intake: 112 oz water (reports noticing different water brands taste different) Supplements: Prenatal vitamin.  Sleep: Reports trouble sleeping. Reports sleeps in weird increments. Reports getting around 5 hours but with breaks between.  Stress / self-care: Reports stress at first since being put on leave but now has been dealing well.  GI: No issues reported.  Current average weekly physical activity: 1 hour walks dogs multiple times daily.   24-Hr Dietary Recall First Meal: None reported.  Snack 11 AM:  popcorn Second Meal: None reported.  Snack: None reported.  Third Meal 6 PM: grilled chicken, broccoli, mashed potatoes  Snack 1130 PM: watermelon Beverages: water x 7  bottles daily  NUTRITION DIAGNOSIS  [id number] Predicted suboptimal nutrient intake As related to skipping meals.  As evidenced by reported dietary recall and habits.  NUTRITION INTERVENTION  Nutrition education (E-1) on the following topics:  Balanced nutrition to promote health Nutrition during pregnancy  Importance of consistent meals  Handouts Provided Include  Balanced plate and food list Balanced snack sheet  Learning Style & Readiness for Change Teaching method utilized: Visual & Auditory  Demonstrated degree of understanding via: Teach Back  Barriers to learning/adherence to lifestyle change: None reported.   Goals Established by Pt   Goal #1: Eat every 3-4 hours while awake:  Set timer to stop to eat within 1 hour of waking and then 4-5 hours for meals.  If meal is spaced 5 or more hours apart recommend a snack   Water Goal: 80 oz daily or more-Doing great!   Make physical activity a part of your week. Try to include at least 30 minutes of physical activity 5 days each week or at least 150 minutes per week. Regular physical activity promotes overall health-including helping to reduce risk for heart disease and diabetes, promoting mental health, and helping [place] sleep better.   Doing great! Continue!    Continue with prenatal multivitamin. Recommend asking your doctor about your most recent hemoglobin level.   MONITORING & EVALUATION Dietary intake, weekly physical activity in 3 months.  Next Steps  Patient is to see goals above.
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
## 2022-08-07 ENCOUNTER — Encounter: Payer: Self-pay | Admitting: Internal Medicine

## 2022-08-07 ENCOUNTER — Ambulatory Visit (INDEPENDENT_AMBULATORY_CARE_PROVIDER_SITE_OTHER): Payer: Medicaid Other | Admitting: Internal Medicine

## 2022-08-07 VITALS — BP 102/66 | HR 85 | Temp 98.7°F | Ht 63.0 in | Wt 214.0 lb

## 2022-08-07 DIAGNOSIS — O99211 Obesity complicating pregnancy, first trimester: Secondary | ICD-10-CM

## 2022-08-07 DIAGNOSIS — G4733 Obstructive sleep apnea (adult) (pediatric): Secondary | ICD-10-CM | POA: Diagnosis not present
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
## 2022-08-08 ENCOUNTER — Encounter: Payer: Self-pay | Admitting: Internal Medicine

## 2022-08-08 NOTE — Assessment & Plan Note (Signed)
Discussed these issues in terms of optimal management of obstructive sleep apnea and emphasized good sleep habits.

## 2022-08-08 NOTE — Assessment & Plan Note (Signed)
Her need for CPAP will go up with weight gain during her pregnancy.  We need to push to get this machine replaced.

## 2022-08-11 ENCOUNTER — Encounter: Payer: Self-pay | Admitting: Obstetrics and Gynecology

## 2022-08-11 DIAGNOSIS — N879 Dysplasia of cervix uteri, unspecified: Secondary | ICD-10-CM | POA: Insufficient documentation

## 2022-08-14 ENCOUNTER — Telehealth: Payer: Self-pay

## 2022-08-14 NOTE — Telephone Encounter (Signed)
Called pt to discuss PAP smear results. Pt verbalized understanding with plan of care.

## 2022-08-18 ENCOUNTER — Other Ambulatory Visit: Payer: Self-pay

## 2022-08-18 ENCOUNTER — Ambulatory Visit (INDEPENDENT_AMBULATORY_CARE_PROVIDER_SITE_OTHER): Payer: Medicaid Other | Admitting: Family Medicine

## 2022-08-18 VITALS — BP 96/69 | HR 95 | Wt 213.1 lb

## 2022-08-18 DIAGNOSIS — O10919 Unspecified pre-existing hypertension complicating pregnancy, unspecified trimester: Secondary | ICD-10-CM

## 2022-08-18 DIAGNOSIS — Z720 Tobacco use: Secondary | ICD-10-CM

## 2022-08-18 DIAGNOSIS — G4733 Obstructive sleep apnea (adult) (pediatric): Secondary | ICD-10-CM

## 2022-08-18 DIAGNOSIS — O0990 Supervision of high risk pregnancy, unspecified, unspecified trimester: Secondary | ICD-10-CM

## 2022-08-18 DIAGNOSIS — O99211 Obesity complicating pregnancy, first trimester: Secondary | ICD-10-CM

## 2022-08-18 MED ORDER — COMFORT FIT MATERNITY SUPP MED MISC: 1.0000 [IU] | Freq: Once | 0 refills | Status: AC

## 2022-08-18 NOTE — Progress Notes (Signed)
   PRENATAL VISIT NOTE  Subjective:  Shirley Terrell is a 36 y.o. [phone] at [redacted]w[redacted]d being seen today for ongoing prenatal care.  She is currently monitored for the following issues for this high-risk pregnancy and has Chronic hypertension during pregnancy, antepartum; Essential hypertension; Obesity affecting pregnancy in first trimester; Obesity, Class III, BMI 40-49.9 (morbid obesity) ([place]); OSA (obstructive sleep apnea); Tobacco abuse; S/p umbilical hernia mesh repair; Anemia; Supervision of high risk pregnancy, antepartum; Elevated ALT measurement; and Cervical dysplasia on their problem list.  Patient reports  lower abdominal pressure, feels different from other pregnancies .  Contractions: Not present. Vag. Bleeding: None.  Movement: Present. Denies leaking of fluid.   The following portions of the patient's history were reviewed and updated as appropriate: allergies, current medications, past family history, past medical history, past social history, past surgical history and problem list.   Objective:   Vitals:   08/18/22 1017  BP: 96/69  Pulse: 95  Weight: 213 lb 1.6 oz (96.7 kg)    Fetal Status: Fetal Heart Rate (bpm): 155   Movement: Present     General:  Alert, oriented and cooperative. Patient is in no acute distress.  Skin: Skin is warm and dry. No rash noted.   Cardiovascular: Normal heart rate noted  Respiratory: Normal respiratory effort, no problems with respiration noted  Abdomen: Soft, gravid, appropriate for gestational age.  Pain/Pressure: Absent     Pelvic: Cervical exam deferred        Extremities: Normal range of motion.  Edema: None  Mental Status: Normal mood and affect. Normal behavior. Normal judgment and thought content.   Assessment and Plan:  Pregnancy: [id number] at [redacted]w[redacted]d 1. Supervision of high risk pregnancy, antepartum Up to date AFP today Reviewed pelvic pressure-- given rx for belly band and also  instructed on pelvic flood exercises.   2.  Obesity affecting pregnancy in first trimester, unspecified obesity type TWG=8 lb 1.6 oz (3.674 kg)   3. Chronic hypertension during pregnancy, antepartum BP WNL today Continue amlodipine/labetalol  4. Tobacco abuse Encouraged cessation  5. OSA (obstructive sleep apnea) HAd OSA visit on 9/21 Pulm is working to get CPAP covered  Preterm labor symptoms and general obstetric precautions including but not limited to vaginal bleeding, contractions, leaking of fluid and fetal movement were reviewed in detail with the patient. Please refer to After Visit Summary for other counseling recommendations.   Return in about 4 weeks (around 09/15/2022) for Routine prenatal care, MD or APP.  Future Appointments  Date Time Provider [place]  09/09/2022 12:45 PM [facility] NURSE [facility]  09/09/2022  1:00 PM [facility] US1 [facility]  10/14/2022 11:45 AM Leota Sauers, RD [place] [facility]  10/24/2022  3:40 PM Johney Frame, Greer Ee, MD [facility] None    Caren Macadam, MD

## 2022-08-20 ENCOUNTER — Encounter: Payer: Self-pay | Admitting: Family Medicine

## 2022-08-20 LAB — AFP, SERUM, OPEN SPINA BIFIDA
AFP MoM: 1.62
AFP Value: 48.7 ng/mL
Gest. Age on Collection Date: 16 weeks
Maternal Age At EDD: 34.5 yr
OSBR Risk 1 IN: 3989
Test Results:: NEGATIVE
Weight: 213 [lb_av]

## 2022-09-09 ENCOUNTER — Ambulatory Visit: Payer: Medicaid Other | Attending: Obstetrics and Gynecology

## 2022-09-09 ENCOUNTER — Ambulatory Visit: Payer: Medicaid Other

## 2022-09-09 ENCOUNTER — Other Ambulatory Visit: Payer: Self-pay

## 2022-09-09 VITALS — BP 101/60 | HR 89

## 2022-09-09 DIAGNOSIS — O10912 Unspecified pre-existing hypertension complicating pregnancy, second trimester: Secondary | ICD-10-CM

## 2022-09-09 DIAGNOSIS — Z362 Encounter for other antenatal screening follow-up: Secondary | ICD-10-CM

## 2022-09-09 DIAGNOSIS — Z3689 Encounter for other specified antenatal screening: Secondary | ICD-10-CM

## 2022-09-09 DIAGNOSIS — O0990 Supervision of high risk pregnancy, unspecified, unspecified trimester: Secondary | ICD-10-CM

## 2022-09-11 ENCOUNTER — Encounter: Payer: Self-pay | Admitting: Obstetrics and Gynecology

## 2022-09-11 DIAGNOSIS — O4402 Placenta previa specified as without hemorrhage, second trimester: Secondary | ICD-10-CM | POA: Insufficient documentation

## 2022-09-14 ENCOUNTER — Inpatient Hospital Stay (HOSPITAL_COMMUNITY)
Admission: AD | Admit: 2022-09-14 | Discharge: 2022-09-14 | Disposition: A | Discharge status: Home / Self Care | Payer: Medicaid Other | Attending: Obstetrics & Gynecology | Admitting: Obstetrics & Gynecology

## 2022-09-14 ENCOUNTER — Encounter (HOSPITAL_COMMUNITY): Payer: Self-pay | Admitting: Obstetrics & Gynecology

## 2022-09-14 ENCOUNTER — Inpatient Hospital Stay (HOSPITAL_COMMUNITY): Payer: Medicaid Other

## 2022-09-14 ENCOUNTER — Inpatient Hospital Stay (HOSPITAL_BASED_OUTPATIENT_CLINIC_OR_DEPARTMENT_OTHER): Payer: Medicaid Other

## 2022-09-14 DIAGNOSIS — O99891 Other specified diseases and conditions complicating pregnancy: Secondary | ICD-10-CM | POA: Diagnosis not present

## 2022-09-14 DIAGNOSIS — R339 Retention of urine, unspecified: Secondary | ICD-10-CM | POA: Diagnosis not present

## 2022-09-14 DIAGNOSIS — R319 Hematuria, unspecified: Secondary | ICD-10-CM | POA: Diagnosis present

## 2022-09-14 DIAGNOSIS — O26892 Other specified pregnancy related conditions, second trimester: Secondary | ICD-10-CM | POA: Insufficient documentation

## 2022-09-14 DIAGNOSIS — Z3A20 20 weeks gestation of pregnancy: Secondary | ICD-10-CM | POA: Diagnosis not present

## 2022-09-14 DIAGNOSIS — R109 Unspecified abdominal pain: Secondary | ICD-10-CM | POA: Diagnosis not present

## 2022-09-14 LAB — URINALYSIS, ROUTINE W REFLEX MICROSCOPIC
Bilirubin Urine: NEGATIVE
Glucose, UA: NEGATIVE mg/dL
Ketones, ur: NEGATIVE mg/dL
Nitrite: NEGATIVE
Protein, ur: NEGATIVE mg/dL
Specific Gravity, Urine: 1.004 — ABNORMAL LOW (ref 1.005–1.030)
pH: 7 (ref 5.0–8.0)

## 2022-09-14 MED ORDER — LACTATED RINGERS IV BOLUS
1000.0000 mL | Freq: Once | INTRAVENOUS | Status: AC
  Administered 2022-09-14: 1000 mL via INTRAVENOUS

## 2022-09-14 NOTE — MAU Note (Signed)
.  Shirley Terrell is a 36 y.o. at [redacted]w[redacted]d here in MAU reporting: unable to urinate, had the urge with "feeling of full bladder" but nothing all day. Pt states she thought she was not drinking enough but went to the bathroom in the lobby, report some dysuria. Pt report some ABD pressure and pulling bilaterally. Pt reports a intermittent HA that started yesterday, not taking any pain medications. Pt denies DFM, VB, LOF, and complications in the pregnancy. CHTN on Norvasc, Labetalol. Also taking aspirin and prenatal vitamins.    Onset of complaint: yesterday  Pain score: ABD 7/10, Ha 5/10. Vitals:   09/14/22 0433  BP: 126/68  Pulse: 92  Resp: 18  Temp: 98.3 F (36.8 C)  SpO2: 96%     FHT:158 Lab orders placed from triage:  Bladder Scan 642mL

## 2022-09-14 NOTE — MAU Provider Note (Addendum)
Chief Complaint: Urinary Retention   Event Date/Time   First Provider Initiated Contact with Patient 09/14/22 0500      SUBJECTIVE HPI: Shirley Terrell is a 36 y.o. [id number] at 36w2dby [facility] who [facility] to [facility] reporting she has been unable to urinate more than a few drops for about 24 hours.  She was able to leave a small amount of urine for labs here in MAU but feels like her bladder is very full but she cannot go. She has tried different positions on the toilet, trying to go in the shower and spraying herself with water, and has increased her water intake all day to try to make herself go.  She now feels pressure in her low abdomen and up into her ribs since her bladder feels so full.  She is feeling fetal movement. There are no other associated symptoms.  HPI  Past Medical History:  Diagnosis Date   Anemia    takes iron supplement   Asthma    prn inhaler   Hypertension    states BP normalized while pregnant and since delivery; is currently not on med.   Lisfranc dislocation 11/30/2016   left   Pregnancy induced hypertension    with 2nd preg   Sleep apnea    uses cpap   Past Surgical History:  Procedure Laterality Date   CHOLECYSTECTOMY  04/13/2012   Procedure: LAPAROSCOPIC CHOLECYSTECTOMY WITH INTRAOPERATIVE CHOLANGIOGRAM;  Surgeon: TOdis Hollingshead MD;  [facility]  Service: General;  Laterality: N/A;  laparoscopic cholecystectomy with IOC   INCISIONAL HERNIA REPAIR  08/13/2018   LAPROSCOPIC   INCISIONAL HERNIA REPAIR N/A 08/13/2018   Procedure: LAPAROSCOPIC INCISIONAL HERNIA REPAIR;  Surgeon: CClovis Riley MD;  [place] [facility]  Service: General;  Laterality: N/A;   INSERTION OF MESH N/A 08/13/2018   Procedure: INSERTION OF MESH;  Surgeon: CClovis Riley MD;  [place] [facility]  Service: General;  Laterality: N/A;   OPEN REDUCTION INTERNAL FIXATION (ORIF) FOOT LISFRANC FRACTURE Left 12/18/2016   Procedure: OPEN REDUCTION  INTERNAL FIXATION (ORIF) FOOT LISFRANC FRACTURE;  Surgeon: JWylene Simmer MD;  [place] [facility]  Service: Orthopedics;  Laterality: Left;   Social History   Socioeconomic History   Marital status: Single    Spouse name: Not on file   Number of children: Not on file   Years of education: Not on file   Highest education level: Not on file  Occupational History   Not on file  Tobacco Use   Smoking status: Former    Packs/day: 0.00    Years: 3.00    Total pack years: 0.00    Types: Cigarettes    Quit date: 12/14/2017    Years since quitting: 4.7   Smokeless tobacco: Never   Tobacco comments:    6 cigarettes/day  Vaping Use   Vaping Use: Never used  Substance and Sexual Activity   Alcohol use: Not Currently    Comment: occasionally   Drug use: No   Sexual activity: Yes    Partners: Male    Birth control/protection: None  Other Topics Concern   Not on file  Social History Narrative   She is single with 2 daughters, age 6828yrs and 1 yr in 2019.    Social Determinants of Health   Financial Resource Strain: Not on file  Food Insecurity: Not on file  Transportation Needs: Not on file  Physical Activity: Not on file  Stress: Not on file  Social Connections: Not on file  Intimate Partner Violence: Not on file   No current facility-administered medications on file prior to encounter.   Current Outpatient Medications on File Prior to Encounter  Medication Sig Dispense Refill   amLODipine (NORVASC) 10 MG tablet Take 1 tablet (10 mg total) by mouth daily. 30 tablet 3   aspirin EC 81 MG tablet Take 1 tablet (81 mg total) by mouth daily. 60 tablet 1   labetalol (NORMODYNE) 100 MG tablet Take 1 tablet (100 mg total) by mouth 2 (two) times daily. 180 tablet 3   Prenatal Vit-Fe Fumarate-FA (PRENATAL VITAMINS PO) Take 1 tablet by mouth daily.     albuterol (VENTOLIN HFA) 108 (90 Base) MCG/ACT inhaler Inhale 2 puffs into the lungs every 6 (six) hours as needed for  wheezing. 18 g 0   Blood Pressure Monitor DEVI Please provide patient with insurance approved blood pressure monitor 1 Device 0   Blood Pressure Monitoring (BLOOD PRESSURE KIT) DEVI 1 Device by Does not apply route as needed. 1 each 0   Misc. Devices (GOJJI WEIGHT SCALE) MISC 1 Device by Does not apply route as needed. (Patient not taking: Reported on 09/09/2022) 1 each 0   No Known Allergies  ROS:  Review of Systems  Constitutional:  Negative for chills, fatigue and fever.  HENT:  Negative for sinus pressure.   Eyes:  Negative for photophobia.  Respiratory:  Negative for shortness of breath.   Cardiovascular:  Negative for chest pain.  Gastrointestinal:  Positive for abdominal pain. Negative for nausea and vomiting.  Genitourinary:  Positive for difficulty urinating. Negative for dysuria, flank pain, frequency, pelvic pain, vaginal bleeding, vaginal discharge and vaginal pain.  Musculoskeletal:  Negative for neck pain.  Neurological:  Negative for dizziness, weakness and headaches.  Psychiatric/Behavioral: Negative.       I have reviewed patient's Past Medical Hx, Surgical Hx, Family Hx, Social Hx, medications and allergies.   Physical Exam  Patient Vitals for the past 24 hrs:  BP Temp Temp src Pulse Resp SpO2 Height Weight  09/14/22 0632 124/74 -- -- 87 17 100 % -- --  09/14/22 0445 117/65 -- -- 90 -- 97 % -- --  09/14/22 0433 126/68 98.3 F (36.8 C) Oral 92 18 96 % _0  (1.6 m) 98.9 kg   Constitutional: Well-developed, well-nourished female in mild distress.  Cardiovascular: normal rate Respiratory: normal effort GI: Abd soft, non-tender. Pos BS x 4 MS: Extremities nontender, no edema, normal ROM Neurologic: Alert and oriented x 4.  GU: Neg CVAT.  PELVIC EXAM: Deferred  FHT 158 by doppler  Bladder scan by RN initially inconclusive, getting 200 ml vs 600 ml.   I&O cath with 50 ml only Rescan of bladder by RN and CNM with 160 ml  LAB RESULTS Results for orders  placed or performed during the hospital encounter of 09/14/22 (from the past 24 hour(s))  Urinalysis, Routine w reflex microscopic     Status: Abnormal   Collection Time: 09/14/22  4:41 AM  Result Value Ref Range   Color, Urine YELLOW YELLOW   APPearance HAZY (A) CLEAR   Specific Gravity, Urine 1.004 (L) 1.005 - 1.030   pH 7.0 5.0 - 8.0   Glucose, UA NEGATIVE NEGATIVE mg/dL   Hgb urine dipstick SMALL (A) NEGATIVE   Bilirubin Urine NEGATIVE NEGATIVE   Ketones, ur NEGATIVE NEGATIVE mg/dL   Protein, ur NEGATIVE NEGATIVE mg/dL   Nitrite NEGATIVE NEGATIVE  Leukocytes,Ua SMALL (A) NEGATIVE   RBC / HPF 0-5 0 - 5 RBC/hpf   WBC, UA 0-5 0 - 5 WBC/hpf   Bacteria, UA RARE (A) NONE SEEN   Squamous Epithelial / LPF 6-10 0 - 5    O/Positive/-- (09/11 1010)  IMAGING Limited OB US without abnormalities on initial worksheet, see final read from MFM  MAU Management/MDM: Orders Placed This Encounter  Procedures   [place] MFM OB Limited   US RENAL   Urinalysis, Routine w reflex microscopic   In and Out Cath   Insert foley catheter   Discharge patient    Meds ordered this encounter  Medications   lactated ringers bolus 1,000 mL    No evidence of uterine abnormality causing urinary retention.  Less than 200 ml seen on bladder scan.  Pt has strong urge to urinate and is uncomfortable.   UA with small hgb, small leukocytes, nitrite negative, no dysuria. Will send for culture.  IV fluids given and Renal US ordered  Renal US wnl, after 1000 ml IV fluids, pt voided 50 ml and repeat bladder scan showed 280-290 ml Consult Dr Harolyn Rutherford.  Insert Foley catheter and D/C home Pt has [place] appt with Dr Damita Dunnings on 10/30 Return to MAU as needed for emergencies  [place] [facility] [place] at [facility] Follow up.   Specialty: Obstetrics and Gynecology Why: As scheduled on 09/15/22 Contact information: [address] [place]  [id number] [place] [facility] Follow up.   Specialty: Obstetrics and Gynecology Why: As needed for emergencies Contact information: [address] [id number] [place]                Fatima Blank Certified Nurse-Midwife 09/14/2022  9:26 AM   Consulted Dr. Harolyn Rutherford. Per MD sed patient home with outpatient foley.

## 2022-09-15 ENCOUNTER — Other Ambulatory Visit: Payer: Self-pay

## 2022-09-15 ENCOUNTER — Ambulatory Visit (INDEPENDENT_AMBULATORY_CARE_PROVIDER_SITE_OTHER): Payer: Medicaid Other | Admitting: Obstetrics and Gynecology

## 2022-09-15 ENCOUNTER — Encounter: Payer: Self-pay | Admitting: Family Medicine

## 2022-09-15 VITALS — BP 89/53 | HR 94 | Wt 213.0 lb

## 2022-09-15 DIAGNOSIS — O4402 Placenta previa specified as without hemorrhage, second trimester: Secondary | ICD-10-CM

## 2022-09-15 DIAGNOSIS — O0990 Supervision of high risk pregnancy, unspecified, unspecified trimester: Secondary | ICD-10-CM

## 2022-09-15 DIAGNOSIS — R7401 Elevation of levels of liver transaminase levels: Secondary | ICD-10-CM

## 2022-09-15 DIAGNOSIS — O99211 Obesity complicating pregnancy, first trimester: Secondary | ICD-10-CM

## 2022-09-15 DIAGNOSIS — O10919 Unspecified pre-existing hypertension complicating pregnancy, unspecified trimester: Secondary | ICD-10-CM

## 2022-09-15 DIAGNOSIS — Z23 Encounter for immunization: Secondary | ICD-10-CM

## 2022-09-15 DIAGNOSIS — R339 Retention of urine, unspecified: Secondary | ICD-10-CM

## 2022-09-15 DIAGNOSIS — K439 Ventral hernia without obstruction or gangrene: Secondary | ICD-10-CM

## 2022-09-15 DIAGNOSIS — Z3A20 20 weeks gestation of pregnancy: Secondary | ICD-10-CM

## 2022-09-15 DIAGNOSIS — Z720 Tobacco use: Secondary | ICD-10-CM

## 2022-09-15 DIAGNOSIS — G4733 Obstructive sleep apnea (adult) (pediatric): Secondary | ICD-10-CM

## 2022-09-15 NOTE — Progress Notes (Signed)
   PRENATAL VISIT NOTE  Subjective:  Shirley Terrell is a 36 y.o. [phone] at [redacted]w[redacted]d being seen today for ongoing prenatal care.  She is currently monitored for the following issues for this high-risk pregnancy and has Chronic hypertension during pregnancy, antepartum; Essential hypertension; Obesity affecting pregnancy in first trimester; Obesity, Class III, BMI 40-49.9 (morbid obesity) ([place] OSA (obstructive sleep apnea); Tobacco abuse; S/p umbilical hernia mesh repair; Supervision of high risk pregnancy, antepartum; Elevated ALT measurement; Cervical dysplasia; and Placenta previa antepartum in second trimester on their problem list.  Patient reports no complaints.  Contractions: Not present. Vag. Bleeding: None.  Movement: Present. Denies leaking of fluid.   The following portions of the patient's history were reviewed and updated as appropriate: allergies, current medications, past family history, past medical history, past social history, past surgical history and problem list.   Objective:   Vitals:   09/15/22 1352  BP: (!) 89/53  Pulse: 94  Weight: 213 lb (96.6 kg)    Fetal Status: Fetal Heart Rate (bpm): 152   Movement: Present     General:  Alert, oriented and cooperative. Patient is in no acute distress.  Skin: Skin is warm and dry. No rash noted.   Cardiovascular: Normal heart rate noted  Respiratory: Normal respiratory effort, no problems with respiration noted  Abdomen: Soft, gravid, appropriate for gestational age.  Pain/Pressure: Present     Pelvic: Cervical exam deferred        Extremities: Normal range of motion.  Edema: Trace  Mental Status: Normal mood and affect. Normal behavior. Normal judgment and thought content.   Assessment and Plan:  Pregnancy: [id number] at [redacted]w[redacted]d 1. Chronic hypertension during pregnancy, antepartum BP today completely normal.  Continue amlodipine. Baseline labs wnl except ALT elevated.   2. OSA (obstructive sleep apnea) Backorder of CPAP  machine  3. Obesity affecting pregnancy in first trimester, unspecified obesity type Will have multiple reasons to continue serial growth ([facility], [facility], and obesity in pregnancy)  4. Tobacco abuse Quit - several weeks ago!  5. S/p umbilical hernia mesh repair Op note reviewed: It was epigastric in location and was SUPERIOR to the umbilicus ("several cm"). Defect was 3 cm and mesh was a 6x8" mesh. Done laparoscopically  6. Supervision of high risk pregnancy, antepartum AFP wnl Flu shot offered and recommended - pt accepts Panorama wnl Anatomy incomplete on 10/24 - f/u scheduled for November 22 Work note given for high risk pregnancy  7. Elevated ALT measurement ALT was 42 at new OB. Recheck at 28w.   8. Placenta previa antepartum in second trimester Continue pelvic rest - improved to LLP on 10/24.  Will continue to follow with [place].   9. Urinary Retention - Voiding trial today to remove catheter - inserted 150cc, voided 200 cc no issue. Catheter removed.   Preterm labor symptoms and general obstetric precautions including but not limited to vaginal bleeding, contractions, leaking of fluid and fetal movement were reviewed in detail with the patient. Please refer to After Visit Summary for other counseling recommendations.   No follow-ups on file.  Future Appointments  Date Time Provider [employer]  10/08/2022  9:30 AM [facility] NURSE [facility]  10/08/2022  9:45 AM [facility] US6 [facility]  10/13/2022  8:55 AM Anyanwu, Sallyanne Havers, MD [facility]  10/14/2022 11:45 AM Alric Quan D, RD [place] [facility]  10/24/2022  3:40 PM Johney Frame, Greer Ee, MD [facility] None    Radene Gunning, MD

## 2022-10-08 ENCOUNTER — Other Ambulatory Visit: Payer: Self-pay | Admitting: *Deleted

## 2022-10-08 ENCOUNTER — Ambulatory Visit: Payer: Medicaid Other | Attending: Obstetrics

## 2022-10-08 ENCOUNTER — Ambulatory Visit: Payer: Medicaid Other | Admitting: *Deleted

## 2022-10-08 VITALS — BP 113/53 | HR 92

## 2022-10-08 DIAGNOSIS — O10912 Unspecified pre-existing hypertension complicating pregnancy, second trimester: Secondary | ICD-10-CM | POA: Insufficient documentation

## 2022-10-08 DIAGNOSIS — O0990 Supervision of high risk pregnancy, unspecified, unspecified trimester: Secondary | ICD-10-CM

## 2022-10-08 DIAGNOSIS — O99212 Obesity complicating pregnancy, second trimester: Secondary | ICD-10-CM | POA: Diagnosis not present

## 2022-10-08 DIAGNOSIS — O99332 Smoking (tobacco) complicating pregnancy, second trimester: Secondary | ICD-10-CM

## 2022-10-08 DIAGNOSIS — Z362 Encounter for other antenatal screening follow-up: Secondary | ICD-10-CM | POA: Insufficient documentation

## 2022-10-08 DIAGNOSIS — O10012 Pre-existing essential hypertension complicating pregnancy, second trimester: Secondary | ICD-10-CM | POA: Diagnosis not present

## 2022-10-08 DIAGNOSIS — E669 Obesity, unspecified: Secondary | ICD-10-CM

## 2022-10-08 DIAGNOSIS — Z3689 Encounter for other specified antenatal screening: Secondary | ICD-10-CM | POA: Insufficient documentation

## 2022-10-08 DIAGNOSIS — Z3A23 23 weeks gestation of pregnancy: Secondary | ICD-10-CM

## 2022-10-08 DIAGNOSIS — R638 Other symptoms and signs concerning food and fluid intake: Secondary | ICD-10-CM

## 2022-10-13 ENCOUNTER — Other Ambulatory Visit: Payer: Self-pay

## 2022-10-13 ENCOUNTER — Ambulatory Visit (INDEPENDENT_AMBULATORY_CARE_PROVIDER_SITE_OTHER): Payer: Medicaid Other | Admitting: Obstetrics & Gynecology

## 2022-10-13 VITALS — BP 113/78 | HR 92 | Wt 222.1 lb

## 2022-10-13 DIAGNOSIS — O99212 Obesity complicating pregnancy, second trimester: Secondary | ICD-10-CM

## 2022-10-13 DIAGNOSIS — Z3A24 24 weeks gestation of pregnancy: Secondary | ICD-10-CM

## 2022-10-13 DIAGNOSIS — O0990 Supervision of high risk pregnancy, unspecified, unspecified trimester: Secondary | ICD-10-CM

## 2022-10-13 DIAGNOSIS — O10919 Unspecified pre-existing hypertension complicating pregnancy, unspecified trimester: Secondary | ICD-10-CM

## 2022-10-13 NOTE — Patient Instructions (Addendum)
Oral Glucose Tolerance Test During Pregnancy Why am I having this test? The oral glucose tolerance test (GTT) is done to check how your body processes blood sugar (glucose). This is one of several tests used to diagnose diabetes that develops during pregnancy (gestational diabetes mellitus). Gestational diabetes is a short-term form of diabetes that some women develop while they are pregnant. It usually occurs during the second or third trimester of pregnancy and goes away after delivery. Testing, or screening, for gestational diabetes usually occurs around 28 of pregnancy. This test may also be needed earlier if: You have a history of gestational diabetes. There is a history of giving birth to very large babies or of losing pregnancies (having stillbirths). You have signs and symptoms of diabetes, such as: Changes in your eyesight. Tingling or numbness in your hands or feet. Changes in hunger, thirst, and urination, and these are not explained by your pregnancy. What is being tested? This test measures the amount of glucose in your blood at different times during a period of 2 hours. This shows how well your body can process glucose.  You will have three separate blood draws. What kind of sample is taken?  Blood samples are required for this test. They are usually collected by inserting a needle into a blood vessel. How do I prepare for this test? For 3 days before your test, eat normally. Have plenty of carbohydrate-rich foods. You will be asked not to eat or drink anything other than water (to fast) starting 8-10 hours before the test. Tell a health care provider about: All medicines you are taking, including vitamins, herbs, eye drops, creams, and over-the-counter medicines. Any blood disorders you have. Any surgeries you have had. Any medical conditions you have. What happens during the test? First, your blood glucose will be measured. This is referred to as your fasting blood glucose  because you fasted before the test. Then, you will drink a glucose solution that contains a certain amount of glucose. Your blood glucose will be measured again 1 and 2 hours after you drink the solution. This test takes about 2 hours to complete. You will need to stay at the testing location during this time. During the testing period: Do not eat or drink anything other than the glucose solution. Do not exercise. Do not use any products that contain nicotine or tobacco, such as cigarettes, e-cigarettes, and chewing tobacco. These can affect your test results. If you need help quitting, ask your health care provider. The testing procedure may vary among health care providers and hospitals. How are the results reported? Your results will be reported as milligrams of glucose per deciliter of blood (mg/dL) or millimoles per liter (mmol/L). There is more than one source for screening and diagnosis reference values used to diagnose gestational diabetes. Your health care provider will compare your results to normal values that were established after testing a large group of people (reference values). Reference values may vary among labs and hospitals. For this test, reference values are: Fasting: 92 mg/dL 1 hour: 180 mg/dL  2 hour: 153 mg/dL   What do the results mean? Results below the reference values are considered normal. If one or more of your blood glucose levels are at or above the reference values, you will be diagnosed with gestational diabetes.  Talk with your health care provider about what your results mean. Questions to ask your health care provider Ask your health care provider, or the department that is doing the test: When   will my results be ready? How will I get my results? What are my treatment options? What other tests do I need? What are my next steps? Summary The oral glucose tolerance test (GTT) is one of several tests used to diagnose diabetes that develops during pregnancy  (gestational diabetes mellitus). Gestational diabetes is a short-term form of diabetes that some women develop while they are pregnant. You may also have this test if you have any symptoms or risk factors for this type of diabetes. Talk with your health care provider about what your results mean. This information is not intended to replace advice given to you by your health care provider. Make sure you discuss any questions you have with your health care provider.  TDaP Vaccine Pregnancy Get the Whooping Cough Vaccine While You Are Pregnant (CDC)  It is important for women to get the whooping cough vaccine in the third trimester of each pregnancy. Vaccines are the best way to prevent this disease. There are 2 different whooping cough vaccines. Both vaccines combine protection against whooping cough, tetanus and diphtheria, but they are for different age groups: Tdap: for everyone 11 years or older, including pregnant women  DTaP: for children 2 months through 6 years of age  You need the whooping cough vaccine during each of your pregnancies The recommended time to get the shot is during your 27th through 36th week of pregnancy, preferably during the earlier part of this time period. The Centers for Disease Control and Prevention (CDC) recommends that pregnant women receive the whooping cough vaccine for adolescents and adults (called Tdap vaccine) during the third trimester of each pregnancy. The recommended time to get the shot is during your 27th through 36th week of pregnancy, preferably during the earlier part of this time period. This replaces the original recommendation that pregnant women get the vaccine only if they had not previously received it. The American College of Obstetricians and Gynecologists and the American College of Nurse-Midwives support this recommendation.  You should get the whooping cough vaccine while pregnant to pass protection to your baby frame support disabled  and/or not supported in this browser  Learn why Shirley Terrell decided to get the whooping cough vaccine in her 3rd trimester of pregnancy and how her baby girl was born with some protection against the disease. Also available on YouTube. After receiving the whooping cough vaccine, your body will create protective antibodies (proteins produced by the body to fight off diseases) and pass some of them to your baby before birth. These antibodies provide your baby some short-term protection against whooping cough in early life. These antibodies can also protect your baby from some of the more serious complications that come along with whooping cough. Your protective antibodies are at their highest about 2 weeks after getting the vaccine, but it takes time to pass them to your baby. So the preferred time to get the whooping cough vaccine is early in your third trimester. The amount of whooping cough antibodies in your body decreases over time. That is why CDC recommends you get a whooping cough vaccine during each pregnancy. Doing so allows each of your babies to get the greatest number of protective antibodies from you. This means each of your babies will get the best protection possible against this disease.  Getting the whooping cough vaccine while pregnant is better than getting the vaccine after you give birth Whooping cough vaccination during pregnancy is ideal so your baby will have short-term protection as soon as he   is born. This early protection is important because your baby will not start getting his whooping cough vaccines until he is 2 months old. These first few months of life are when your baby is at greatest risk for catching whooping cough. This is also when he's at greatest risk for having severe, potentially life-threating complications from the infection. To avoid that gap in protection, it is best to get a whooping cough vaccine during pregnancy. You will then pass protection to your baby before he  is born. To continue protecting your baby, he should get whooping cough vaccines starting at 2 months old. You may never have gotten the Tdap vaccine before and did not get it during this pregnancy. If so, you should make sure to get the vaccine immediately after you give birth, before leaving the hospital or birthing center. It will take about 2 weeks before your body develops protection (antibodies) in response to the vaccine. Once you have protection from the vaccine, you are less likely to give whooping cough to your newborn while caring for him. But remember, your baby will still be at risk for catching whooping cough from others. A recent study looked to see how effective Tdap was at preventing whooping cough in babies whose mothers got the vaccine while pregnant or in the hospital after giving birth. The study found that getting Tdap between 27 through 36 weeks of pregnancy is 85% more effective at preventing whooping cough in babies younger than 2 months old. Blood tests cannot tell if you need a whooping cough vaccine There are no blood tests that can tell you if you have enough antibodies in your body to protect yourself or your baby against whooping cough. Even if you have been sick with whooping cough in the past or previously received the vaccine, you still should get the vaccine during each pregnancy. Breastfeeding may pass some protective antibodies onto your baby By breastfeeding, you may pass some antibodies you have made in response to the vaccine to your baby. When you get a whooping cough vaccine during your pregnancy, you will have antibodies in your breast milk that you can share with your baby as soon as your milk comes in. However, your baby will not get protective antibodies immediately if you wait to get the whooping cough vaccine until after delivering your baby. This is because it takes about 2 weeks for your body to create antibodies. Learn more about the health benefits of  breastfeeding.  

## 2022-10-13 NOTE — Progress Notes (Signed)
PRENATAL VISIT NOTE  Subjective:  Shirley Terrell is a 36 y.o. [phone] at [redacted]w[redacted]d being seen today for ongoing prenatal care.  She is currently monitored for the following issues for this high-risk pregnancy and has Chronic hypertension during pregnancy, antepartum; Essential hypertension; Obesity affecting pregnancy; Obesity, Class III, BMI 40-49.9 (morbid obesity) ([place]); OSA (obstructive sleep apnea); Tobacco abuse; S/p umbilical hernia mesh repair; Supervision of high risk pregnancy, antepartum; Elevated ALT measurement; and Cervical dysplasia on their problem list.  Patient reports no complaints.  Contractions: Irritability.  .  Movement: Present. Denies leaking of fluid.   The following portions of the patient's history were reviewed and updated as appropriate: allergies, current medications, past family history, past medical history, past social history, past surgical history and problem list.   Objective:   Vitals:   10/13/22 0854  BP: 113/78  Pulse: 92  Weight: 222 lb 1.6 oz (100.7 kg)    Fetal Status: Fetal Heart Rate (bpm): 152   Movement: Present     General:  Alert, oriented and cooperative. Patient is in no acute distress.  Skin: Skin is warm and dry. No rash noted.   Cardiovascular: Normal heart rate noted  Respiratory: Normal respiratory effort, no problems with respiration noted  Abdomen: Soft, gravid, appropriate for gestational age.  Pain/Pressure: Present     Pelvic: Cervical exam deferred        Extremities: Normal range of motion.     Mental Status: Normal mood and affect. Normal behavior. Normal judgment and thought content.   [place] MFM OB FOLLOW UP  Result Date: 10/08/2022 ----------------------------------------------------------------------  OBSTETRICS REPORT                       (Signed Final 10/08/2022 10:12 am) ---------------------------------------------------------------------- Patient Info  ID #:       [id number]                          D.O.B.:   02/06/1986 (36 yrs)  Name:       Shirley Terrell                Visit Date: 10/08/2022 09:53 am ---------------------------------------------------------------------- Performed By  Attending:        Tama High MD        Secondary Phy.:   [facility] MAU/Triage  Performed By:     Eveline Keto         [facility]                                                             [place] [facility]  Referred By:      Elvera Maria ---------------------------------------------------------------------- Orders  #  Description                           Code        Ordered By  1  [place] MFM OB FOLLOW UP                   [phone]    Peterson Ao ----------------------------------------------------------------------  #  Order #                     Accession #                Episode #  1  [id number]                   [id number]                 [id number] ---------------------------------------------------------------------- Indications  Hypertension - Chronic/Pre-existing            O10.019  (labetalol, Norvasc)  Obesity complicating pregnancy, second         O99.212  trimester (pregravid BMI 36)  [redacted] weeks gestation of pregnancy                Z3A.23  Tobacco use complicating pregnancy,            O99.332  second trimester  Low risk NIPS, neg AFP/[place]izon  Antenatal follow-up for nonvisualized fetal    Z36.2  anatomy ---------------------------------------------------------------------- Vital Signs  BP:          113/53 ---------------------------------------------------------------------- Fetal Evaluation  Num Of Fetuses:         1  Fetal Heart Rate(bpm):  155  Cardiac Activity:       Observed  Presentation:           Cephalic  Placenta:               Posterior Left  P. Cord Insertion:      Previously Visualized  Amniotic Fluid  AFI FV:      Within normal limits   AFI Sum(cm)     %Tile       Largest Pocket(cm)  8.8             < 3         8.8  RUQ(cm)  8.8 ---------------------------------------------------------------------- Biometry  BPD:      55.9  mm     G. Age:  23w 0d         21  %    CI:         73.6   %    70 - 86                                                          FL/HC:      20.0   %    18.7 - 20.9  HC:       207   mm     G. Age:  22w 6d          8  %  HC/AC:      1.06        1.05 - 1.21  AC:      195.2  mm     G. Age:  24w 1d         57  %    FL/BPD:     74.2   %    71 - 87  FL:       41.5  mm     G. Age:  23w 3d         30  %    FL/AC:      21.3   %    20 - 24  CER:      25.9  mm     G. Age:  23w 3d         43  %  LV:        5.3  mm  CM:        3.5  mm  Est. FW:     625  gm      1 lb 6 oz     43  % ---------------------------------------------------------------------- OB History  Gravidity:    4         Term:   2        Prem:   0        SAB:   1  TOP:          0       Ectopic:  0        Living: 2 ---------------------------------------------------------------------- Gestational Age  LMP:           23w 5d        Date:  04/25/22                   EDD:   01/30/23  U/S Today:     23w 3d                                        EDD:   02/01/23  Best:          23w 5d     Det. By:  LMP  (04/25/22)          EDD:   01/30/23 ---------------------------------------------------------------------- Anatomy  Cranium:               Appears normal         Diaphragm:              Appears normal  Cavum:                 Appears normal         Stomach:                Appears normal, left                                                                        sided  Ventricles:            Appears normal         Abdomen:  Appears normal  Choroid Plexus:        Appears normal         Abdominal Wall:         Appears nml (cord                                                                        insert, abd wall)  Cerebellum:            Appears normal         Cord Vessels:            Appears normal (3                                                                        vessel cord)  Posterior Fossa:       Appears normal         Kidneys:                Appear normal  Nuchal Fold:           Not applicable (>20    Bladder:                Appears normal                         wks GA)  Lips:                  Appears normal         Spine:                  Appears normal  Heart:                 Appears normal                         (4CH, axis, and                         situs)  Other:  Left foot visualized. All anatomy has now been visualized. ---------------------------------------------------------------------- Cervix Uterus Adnexa  Cervix  Length:            3.2  cm.  Not visualized (advanced GA >24wks)  Uterus  No abnormality visualized.  Right Ovary  Not visualized.  Left Ovary  Not visualized. ---------------------------------------------------------------------- Impression  Patient returned for completion of fetal anatomy .Amniotic  fluid is normal and good fetal activity is seen .Fetal biometry  is consistent with her previously-established dates .Fetal  anatomical survey was completed and appears normal.  Placenta is not low lying.  Patient has chronic hypertension and takes labetalol 100 mg  twice daily and amlodipine 10 mg daily.  Blood pressure today at her office is 113/53 mmHg.  Patient  does not have symptoms of hypotension.  Blood pressures  tend to fall in the second trimester and adjustment of  antihypertensive dosage may  be necessary (norvasc can be  dicontinued). ---------------------------------------------------------------------- Recommendations  -An appointment was made for her to return in 4 weeks for  fetal growth assessment. ----------------------------------------------------------------------                  Noralee Space, MD Electronically Signed Final Report   10/08/2022 10:12 am ----------------------------------------------------------------------  [place] MFM  OB Limited  Result Date: 09/14/2022 ----------------------------------------------------------------------  OBSTETRICS REPORT                       (Signed Final 09/14/2022 12:22 pm) ---------------------------------------------------------------------- Patient Info  ID #:       [id number]                          D.O.B.:  1986/09/22 (36 yrs)  Name:       ALLYSSON RINEHIMER Lavine                Visit Date: 09/14/2022 05:51 am ---------------------------------------------------------------------- Performed By  Attending:        Ma Rings MD         Secondary Phy.:    [facility] MAU/Triage  Performed By:     Earley Brooke     [facility]  Referred By:      Hurshel Party ---------------------------------------------------------------------- Orders  #  Description                           Code        Ordered By  1  [place] MFM OB LIMITED                     15400.86    LISA LEFTWICH-                                                       KIRBY ----------------------------------------------------------------------  #  Order #                     Accession #                Episode #  1  [id number]                   [id number]                 [id number] ---------------------------------------------------------------------- Indications  Abdominal pain in pregnancy                     O99.89  [redacted] weeks gestation of pregnancy                 Z3A.20 ---------------------------------------------------------------------- Fetal Evaluation  Num Of Fetuses:          1  Fetal  Heart Rate(bpm):   155  Cardiac Activity:        Observed  Presentation:            Cephalic  Placenta:                Posterior  P. Cord Insertion:       Previously Visualized  Amniotic Fluid  AFI FV:      Within normal limits                              Largest Pocket(cm)                              6.7  Comment:    No placental abruption or previa  identified. ---------------------------------------------------------------------- OB History  Gravidity:    4         Term:   2        Prem:   0        SAB:   1  TOP:          0       Ectopic:  0        Living: 2 ---------------------------------------------------------------------- Gestational Age  LMP:           20w 2d        Date:  04/25/22                   EDD:   01/30/23  Best:          Hyacinth Meeker 2d     Det. By:  LMP  (04/25/22)          EDD:   01/30/23 ---------------------------------------------------------------------- Cervix Uterus Adnexa  Cervix  Length:            3.3  cm.  Normal appearance by transabdominal scan.  Right Ovary  Size(cm)     4.33   x   2.22   x  1.58      Vol(ml): 7.95  Within normal limits.  Left Ovary  Size(cm)     2.87   x   1.72   x  1.15      Vol(ml): 2.97  Within normal limits.  Adnexa  No abnormality visualized. ---------------------------------------------------------------------- Comments  This patient presented to the MAU as she was unable to  urinate.  A limited ultrasound performed today shows that the fetus is  in the vertex presentation.  There was normal amniotic fluid noted.  The patient's bladder appeared to be empty. ----------------------------------------------------------------------                   Johnell Comings, MD Electronically Signed Final Report   09/14/2022 12:22 pm ----------------------------------------------------------------------  US RENAL  Result Date: 09/14/2022 CLINICAL DATA:  36 year old female with history of back pain. Hematuria. Second trimester pregnancy. EXAM: RENAL / URINARY TRACT ULTRASOUND COMPLETE COMPARISON:  No priors. FINDINGS: Right Kidney: Renal measurements: 11.1 x 4.3 x 5.9 = volume: 150.6 mL. Echogenicity within normal limits. No mass or hydronephrosis visualized. Left Kidney: Renal measurements: 12.6 x 6.9 x 5.6 = volume: 253.1 mL. Echogenicity within normal limits. No mass or hydronephrosis visualized. Bladder: Appears normal for  degree of bladder distention. Other: None. IMPRESSION: 1. No acute findings.  Specifically, no hydronephrosis. Electronically Signed   By: Vinnie Langton M.D.   On: 09/14/2022 08:25    Assessment and Plan:  Pregnancy: [id number] at [redacted]w[redacted]d 1. Chronic hypertension during pregnancy, antepartum Stable BP on Labetalol  and Amlodipine.  Continue antenatal testing and ultrasounds as per MFM and follow MFM recommendations.   2. Obesity affecting pregnancy in second trimester, unspecified obesity type TWG 17 lbs, doing well.   3. [redacted] weeks gestation of pregnancy 4. Supervision of high risk pregnancy, antepartum Resolved previa on scan. Third trimester labs next visit. Preterm labor symptoms and general obstetric precautions including but not limited to vaginal bleeding, contractions, leaking of fluid and fetal movement were reviewed in detail with the patient. Please refer to After Visit Summary for other counseling recommendations.   Return in about 4 weeks (around 11/10/2022) for 2 hr GTT, 3rd trimester labs, TDap, OFFICE OB VISIT (MD only).  Future Appointments  Date Time Provider [place]  10/14/2022 11:45 AM Leota Sauers, RD [place] NDM  10/24/2022  3:40 PM Freada Bergeron, MD [facility] None  11/06/2022  8:30 AM [facility] NURSE [facility]  11/06/2022  8:45 AM [facility] US4 [facility] [place]    Verita Schneiders, MD

## 2022-10-14 ENCOUNTER — Ambulatory Visit: Payer: Medicaid Other | Admitting: Registered"

## 2022-10-22 NOTE — Progress Notes (Signed)
Cardio-Obstetrics Clinic  New Evaluation  Date:  10/24/2022   ID:  Shirley Terrell, DOB Apr 29, 1986, MRN [id number]  PCP:  Aletha Halim, MD   [facility] Providers Cardiologist:  Candee Furbish, MD  Electrophysiologist:  None     Referring MD: Gildardo Pounds, NP   Chief Complaint: HTN, high risk pregnancy  History of Present Illness:    Shirley Terrell is a 36 y.o. female [G4P2012] who is being seen today for the evaluation of HTN, OSA and high risk pregnancy at the request of Gildardo Pounds, NP.   Was last seen in clinic on 07/11/22 for hypertension management. We continued her amlodipine 70m daily and changed her coreg to labetalol 1017mBID.  Today, the patient is currently 2662w0degnant. Shirley Terrell is feeling well overall. BP is running mainly 110s/60-70s  at home. No orthostatic symptoms. Has mild LE edema. No chest pain, SOB, dizziness, lightheadedness, palpitations. Tolerating medications as prescribed.   Prior pregnancies complicated by HTN but no pre-eclampsia, gestational HTN, or IUGR.   Prior CV Studies Reviewed: The following studies were reviewed today: No CV studies  Past Medical History:  Diagnosis Date   Anemia    takes iron supplement   Asthma    prn inhaler   Hypertension    states BP normalized while pregnant and since delivery; is currently not on med.   Lisfranc dislocation 11/30/2016   left   Pregnancy induced hypertension    with 2nd preg   Sleep apnea    uses cpap    Past Surgical History:  Procedure Laterality Date   CHOLECYSTECTOMY  04/13/2012   Procedure: LAPAROSCOPIC CHOLECYSTECTOMY WITH INTRAOPERATIVE CHOLANGIOGRAM;  Surgeon: TodOdis HollingsheadD;  [facility] [place] [place];  Laterality: N/A;  laparoscopic cholecystectomy with IOC   INCISIONAL HERNIA REPAIR  08/13/2018   LAPROSCOPIC   INCISIONAL HERNIA REPAIR N/A 08/13/2018   Procedure: LAPAROSCOPIC INCISIONAL HERNIA REPAIR;  Surgeon: ConClovis RileyD;  [facility] [place];  Laterality: N/A;   INSERTION OF MESH N/A 08/13/2018   Procedure: INSERTION OF MESH;  Surgeon: ConClovis RileyD;  [facility];  Laterality: N/A;   OPEN REDUCTION INTERNAL FIXATION (ORIF) FOOT LISFRANC FRACTURE Left 12/18/2016   Procedure: OPEN REDUCTION INTERNAL FIXATION (ORIF) FOOT LISFRANC FRACTURE;  Surgeon: JohWylene SimmerD;  [facility];  Laterality: Left;      OB History     Gravida  4   Para  2   Term  2   Preterm      AB  1   Living  2      SAB  1   IAB      Ectopic      Multiple  0   Live Births  2               Current Medications: Current Meds  Medication Sig   albuterol (VENTOLIN HFA) 108 (90 Base) MCG/ACT inhaler Inhale 2 puffs into the lungs every 6 (six) hours as needed for wheezing.   amLODipine (NORVASC) 10 MG tablet Take 1 tablet (10 mg total) by mouth daily.   aspirin EC 81 MG tablet Take 1 tablet (81 mg total) by mouth daily.   Blood Pressure Monitor DEVI Please provide patient with insurance approved blood pressure monitor   Blood Pressure Monitoring (BLOOD PRESSURE KIT) DEVI 1 Device by Does not apply route as needed.  labetalol (NORMODYNE) 100 MG tablet Take 1 tablet (100 mg total) by mouth 2 (two) times daily.   Prenatal Vit-Fe Fumarate-FA (PRENATAL VITAMINS PO) Take 1 tablet by mouth daily.     Allergies:   Patient has no known allergies.   Social History   Socioeconomic History   Marital status: Single    Spouse name: Not on file   Number of children: Not on file   Years of education: Not on file   Highest education level: Not on file  Occupational History   Not on file  Tobacco Use   Smoking status: Former    Packs/day: 0.00    Years: 3.00    Total pack years: 0.00    Types: Cigarettes    Quit date: 12/14/2017    Years since quitting: 4.8   Smokeless tobacco: Never   Tobacco comments:    6 cigarettes/day  Vaping Use    Vaping Use: Never used  Substance and Sexual Activity   Alcohol use: Not Currently    Comment: occasionally   Drug use: No   Sexual activity: Yes    Partners: Male    Birth control/protection: None  Other Topics Concern   Not on file  Social History Narrative   Shirley Terrell is single with 2 daughters, age 29 yrs and 1 yr in 2019.    Social Determinants of Health   Financial Resource Strain: Not on file  Food Insecurity: Not on file  Transportation Needs: Not on file  Physical Activity: Not on file  Stress: Not on file  Social Connections: Not on file      Family History  Problem Relation Age of Onset   Hypertension Mother    Stroke Mother    Renal Disease Mother        due to blood pressure   Heart disease Father        MI, no stents   Depression Father    Hypertension Father    Stroke Father    Healthy Sister    Healthy Sister    Healthy Sister    Healthy Sister    Asthma Daughter    Hypertension Maternal Grandmother    Asthma Paternal Grandmother    Cancer Paternal Grandmother        breast   Diabetes Paternal Grandmother    Cancer Paternal Grandfather    Diabetes Other       ROS:   Please see the history of present illness.    All other systems reviewed and are negative.   Labs/EKG Reviewed:    EKG:   EKG isordered today.  The ekg ordered today demonstrates NSR with HR 92  Recent Labs: 07/28/2022: ALT 42; BUN 4; Creatinine, Ser 0.53; Hemoglobin 11.1; Platelets 233; Potassium 4.0; Sodium 141; TSH 2.030   Recent Lipid Panel Lab Results  Component Value Date/Time   CHOL 154 12/25/2021 04:48 PM   TRIG 47 12/25/2021 04:48 PM   HDL 53 12/25/2021 04:48 PM   CHOLHDL 2.9 12/25/2021 04:48 PM   LDLCALC 91 12/25/2021 04:48 PM    Physical Exam:    VS:  BP 119/68   Pulse 90   Ht _0  (1.6 m)   Wt 220 lb (99.8 kg)   LMP 04/25/2022   SpO2 100%   BMI 38.97 kg/m     Wt Readings from Last 3 Encounters:  10/24/22 220 lb (99.8 kg)  10/13/22 222 lb 1.6 oz  (100.7 kg)  09/15/22 213 lb (96.6 kg)  GEN:  Well nourished, well developed in no acute distress HEENT: Normal NECK: No JVD; No carotid bruits CARDIAC: RRR, 1/6 systolic murmur RESPIRATORY:  Clear to auscultation without rales, wheezing or rhonchi  ABDOMEN: Gravid MUSCULOSKELETAL:  No edema; No deformity  SKIN: Warm and dry NEUROLOGIC:  Alert and oriented x 3 PSYCHIATRIC:  Normal affect    Risk Assessment/Risk Calculators:     ASSESSMENT & PLAN:    #Chronic HTN: #High Risk Pregnancy: Patient with chronic hypertension that is now complicating current pregnancy. Currently, Shirley Terrell is doing well with BP well controlled. Denies orthostatic symptoms. Discussed if her SBP<100, we can always back down on her antihypertensives but Shirley Terrell would like to continues as is for now as Shirley Terrell has no orthostatic symptoms.  -Continue amlodipine 49m daily -Continue labetalol 1023mBID -Continue ASA 8180maily -Will monitor throughout pregnancy  #OSA: -Plan to fax in her letter to get her CPAP  Patient Instructions  Medication Instructions:   Your physician recommends that you continue on your current medications as directed. Please refer to the Current Medication list given to you today.  *If you need a refill on your cardiac medications before your next appointment, please call your pharmacy*    [place]  [place]         Dispo:  No follow-ups on file.   Medication Adjustments/Labs and Tests Ordered: Current medicines are reviewed at length with the patient today.  Concerns regarding medicines are outlined above.  Tests Ordered: Orders Placed This Encounter  Procedures   EKG 12-Lead   Medication Changes: No orders of the defined types were placed in this encounter.

## 2022-10-24 ENCOUNTER — Encounter: Payer: Self-pay | Admitting: Cardiology

## 2022-10-24 ENCOUNTER — Ambulatory Visit (INDEPENDENT_AMBULATORY_CARE_PROVIDER_SITE_OTHER): Payer: Medicaid Other | Admitting: Cardiology

## 2022-10-24 VITALS — BP 119/68 | HR 90 | Ht 63.0 in | Wt 220.0 lb

## 2022-10-24 DIAGNOSIS — G4733 Obstructive sleep apnea (adult) (pediatric): Secondary | ICD-10-CM

## 2022-10-24 DIAGNOSIS — O10919 Unspecified pre-existing hypertension complicating pregnancy, unspecified trimester: Secondary | ICD-10-CM

## 2022-10-24 DIAGNOSIS — Z3A26 26 weeks gestation of pregnancy: Secondary | ICD-10-CM

## 2022-10-24 DIAGNOSIS — O0990 Supervision of high risk pregnancy, unspecified, unspecified trimester: Secondary | ICD-10-CM | POA: Diagnosis not present

## 2022-10-24 NOTE — Patient Instructions (Signed)
Medication Instructions:   Your physician recommends that you continue on your current medications as directed. Please refer to the Current Medication list given to you today.  *If you need a refill on your cardiac medications before your next appointment, please call your pharmacy*    Follow-Up:  8 WEEKS WITH DR. Shari Prows AT [facility] OR [address]    Important Information About [place]

## 2022-11-03 ENCOUNTER — Other Ambulatory Visit: Payer: Self-pay

## 2022-11-03 DIAGNOSIS — O0990 Supervision of high risk pregnancy, unspecified, unspecified trimester: Secondary | ICD-10-CM

## 2022-11-06 ENCOUNTER — Ambulatory Visit: Payer: Medicaid Other | Attending: Obstetrics and Gynecology

## 2022-11-06 ENCOUNTER — Ambulatory Visit: Payer: Medicaid Other | Admitting: *Deleted

## 2022-11-06 VITALS — BP 122/68 | HR 84

## 2022-11-06 DIAGNOSIS — R638 Other symptoms and signs concerning food and fluid intake: Secondary | ICD-10-CM | POA: Insufficient documentation

## 2022-11-06 DIAGNOSIS — O99212 Obesity complicating pregnancy, second trimester: Secondary | ICD-10-CM | POA: Diagnosis not present

## 2022-11-06 DIAGNOSIS — Z3A27 27 weeks gestation of pregnancy: Secondary | ICD-10-CM

## 2022-11-06 DIAGNOSIS — O10012 Pre-existing essential hypertension complicating pregnancy, second trimester: Secondary | ICD-10-CM

## 2022-11-06 DIAGNOSIS — O0990 Supervision of high risk pregnancy, unspecified, unspecified trimester: Secondary | ICD-10-CM

## 2022-11-06 DIAGNOSIS — O10912 Unspecified pre-existing hypertension complicating pregnancy, second trimester: Secondary | ICD-10-CM | POA: Insufficient documentation

## 2022-11-06 DIAGNOSIS — O99332 Smoking (tobacco) complicating pregnancy, second trimester: Secondary | ICD-10-CM | POA: Diagnosis not present

## 2022-11-06 DIAGNOSIS — E669 Obesity, unspecified: Secondary | ICD-10-CM

## 2022-11-07 ENCOUNTER — Other Ambulatory Visit: Payer: Medicaid Other

## 2022-11-07 ENCOUNTER — Ambulatory Visit (INDEPENDENT_AMBULATORY_CARE_PROVIDER_SITE_OTHER): Payer: Medicaid Other | Admitting: Obstetrics and Gynecology

## 2022-11-07 ENCOUNTER — Other Ambulatory Visit: Payer: Self-pay | Admitting: *Deleted

## 2022-11-07 ENCOUNTER — Other Ambulatory Visit: Payer: Self-pay

## 2022-11-07 VITALS — BP 134/92 | HR 92 | Wt 224.0 lb

## 2022-11-07 DIAGNOSIS — O99212 Obesity complicating pregnancy, second trimester: Secondary | ICD-10-CM

## 2022-11-07 DIAGNOSIS — Z3A28 28 weeks gestation of pregnancy: Secondary | ICD-10-CM

## 2022-11-07 DIAGNOSIS — R638 Other symptoms and signs concerning food and fluid intake: Secondary | ICD-10-CM

## 2022-11-07 DIAGNOSIS — O0990 Supervision of high risk pregnancy, unspecified, unspecified trimester: Secondary | ICD-10-CM

## 2022-11-07 DIAGNOSIS — Z23 Encounter for immunization: Secondary | ICD-10-CM

## 2022-11-07 DIAGNOSIS — R7401 Elevation of levels of liver transaminase levels: Secondary | ICD-10-CM

## 2022-11-07 DIAGNOSIS — O0993 Supervision of high risk pregnancy, unspecified, third trimester: Secondary | ICD-10-CM

## 2022-11-07 DIAGNOSIS — O99332 Smoking (tobacco) complicating pregnancy, second trimester: Secondary | ICD-10-CM

## 2022-11-07 DIAGNOSIS — O10919 Unspecified pre-existing hypertension complicating pregnancy, unspecified trimester: Secondary | ICD-10-CM

## 2022-11-07 DIAGNOSIS — O10913 Unspecified pre-existing hypertension complicating pregnancy, third trimester: Secondary | ICD-10-CM

## 2022-11-07 DIAGNOSIS — O10912 Unspecified pre-existing hypertension complicating pregnancy, second trimester: Secondary | ICD-10-CM

## 2022-11-07 MED ORDER — AMLODIPINE BESYLATE 5 MG PO TABS: 5.0000 mg | ORAL_TABLET | Freq: Every day | ORAL | 3 refills | Status: DC

## 2022-11-07 NOTE — Progress Notes (Signed)
   PRENATAL VISIT NOTE  Subjective:  Shirley Terrell is a 36 y.o. [phone] at [redacted]w[redacted]d being seen today for ongoing prenatal care.  She is currently monitored for the following issues for this high-risk pregnancy and has Chronic hypertension during pregnancy, antepartum; Essential hypertension; Obesity affecting pregnancy; Obesity, Class III, BMI 40-49.9 (morbid obesity) ([facility]); OSA (obstructive sleep apnea); Tobacco abuse; S/p umbilical hernia mesh repair; Supervision of high risk pregnancy, antepartum; Elevated ALT measurement; and Cervical dysplasia on their problem list.  Patient reports no complaints.  Contractions: Not present. Vag. Bleeding: None.  Movement: Present. Denies leaking of fluid.   The following portions of the patient's history were reviewed and updated as appropriate: allergies, current medications, past family history, past medical history, past social history, past surgical history and problem list.   Objective:   Vitals:   11/07/22 1010  BP: (!) 134/92  Pulse: 92  Weight: 224 lb (101.6 kg)    Fetal Status: Fetal Heart Rate (bpm): 146 Fundal Height: 30 cm Movement: Present     General:  Alert, oriented and cooperative. Patient is in no acute distress.  Skin: Skin is warm and dry. No rash noted.   Cardiovascular: Normal heart rate noted  Respiratory: Normal respiratory effort, no problems with respiration noted  Abdomen: Soft, gravid, appropriate for gestational age.  Pain/Pressure: Present     Pelvic: Cervical exam deferred        Extremities: Normal range of motion.  Edema: Trace  Mental Status: Normal mood and affect. Normal behavior. Normal judgment and thought content.   Assessment and Plan:  Pregnancy: [id number] at [redacted]w[redacted]d 1. Supervision of high risk pregnancy, antepartum Tdap today Doing 28w labs today Rh pos  2. Chronic hypertension during pregnancy, antepartum She had ran out of amlodipine so she stopped it. BP mild range today but not severe. She had been  on 10 mg but will drop down to 5 mg in light of this. New Rx sent. Reviewed Dr. Devin Going note. Last growth was normal yesterday, normal AC and AFI. LLP resolved.   3. Elevated ALT measurement Recheck today with 3rd lab draw.  - Comprehensive metabolic panel  Preterm labor symptoms and general obstetric precautions including but not limited to vaginal bleeding, contractions, leaking of fluid and fetal movement were reviewed in detail with the patient. Please refer to After Visit Summary for other counseling recommendations.   No follow-ups on file.  Future Appointments  Date Time Provider Department Center  11/28/2022 11:15 AM [place] Bing, MD [facility]  12/03/2022  3:15 PM Larey Seat, RD [facility] NDM  12/05/2022  9:30 AM [facility] NURSE [facility]  12/05/2022  9:45 AM [facility] US7 [facility]  12/22/2022 10:40 AM Shari Prows, Kathlynn Grate, MD [facility]    Milas Hock, MD

## 2022-11-08 LAB — COMPREHENSIVE METABOLIC PANEL
ALT: 7 IU/L (ref 0–32)
AST: 12 IU/L (ref 0–40)
Albumin/Globulin Ratio: 1.5 (ref 1.2–2.2)
Albumin: 3.2 g/dL — ABNORMAL LOW (ref 3.9–4.9)
Alkaline Phosphatase: 111 IU/L (ref 44–121)
BUN/Creatinine Ratio: 7 — ABNORMAL LOW (ref 9–23)
BUN: 3 mg/dL — ABNORMAL LOW (ref 6–20)
Bilirubin Total: 0.2 mg/dL (ref 0.0–1.2)
CO2: 21 mmol/L (ref 20–29)
Calcium: 8.4 mg/dL — ABNORMAL LOW (ref 8.7–10.2)
Chloride: 107 mmol/L — ABNORMAL HIGH (ref 96–106)
Creatinine, Ser: 0.44 mg/dL — ABNORMAL LOW (ref 0.57–1.00)
Globulin, Total: 2.2 g/dL (ref 1.5–4.5)
Glucose: 87 mg/dL (ref 70–99)
Potassium: 4.3 mmol/L (ref 3.5–5.2)
Sodium: 140 mmol/L (ref 134–144)
Total Protein: 5.4 g/dL — ABNORMAL LOW (ref 6.0–8.5)
eGFR: 130 mL/min/{1.73_m2} (ref >59)

## 2022-11-08 LAB — RPR: RPR Ser Ql: NONREACTIVE

## 2022-11-08 LAB — CBC
Hematocrit: 29.3 % — ABNORMAL LOW (ref 34.0–46.6)
Hemoglobin: 9.8 g/dL — ABNORMAL LOW (ref 11.1–15.9)
MCH: 31.8 pg (ref 26.6–33.0)
MCHC: 33.4 g/dL (ref 31.5–35.7)
MCV: 95 fL (ref 79–97)
Platelets: 197 10*3/uL (ref 150–450)
RBC: 3.08 x10E6/uL — ABNORMAL LOW (ref 3.77–5.28)
RDW: 11.4 % — ABNORMAL LOW (ref 11.7–15.4)
WBC: 11.1 10*3/uL — ABNORMAL HIGH (ref 3.4–10.8)

## 2022-11-08 LAB — GLUCOSE TOLERANCE, 2 HOURS W/ 1HR
Glucose, 1 hour: 149 mg/dL (ref 70–179)
Glucose, 2 hour: 121 mg/dL (ref 70–152)
Glucose, Fasting: 82 mg/dL (ref 70–91)

## 2022-11-08 LAB — HIV ANTIBODY (ROUTINE TESTING W REFLEX): HIV Screen 4th Generation wRfx: NONREACTIVE

## 2022-11-17 NOTE — L&D Delivery Note (Signed)
OB/GYN Faculty Practice Delivery Note  Shirley Terrell is a 37 y.o. [id number] s/p SVD at [redacted]w[redacted]d She was admitted for IOL for cHTN.   ROM: 2h 151mith clear fluid GBS Status:  Negative/-- (02/16 0942) Maximum Maternal Temperature:  Temp (24hrs), Avg:98.1 F (36.7 C), Min:98 F (36.7 C), Max:98.2 F (36.8 C)    Labor Progress: Patient arrived at 0.5 cm dilation and was induced with cytotec, pitocin, AROM.   Delivery Date/Time: 01/16/2023 at 0137 Delivery: Called to room and patient was complete and pushing. Head delivered in direct OA position. No nuchal cord present. Shoulder and body delivered in usual fashion. Infant with spontaneous cry, placed on mother's abdomen, dried and stimulated. Cord clamped x 2 immediately, and cut by provider. Cord pH collected. Cord blood drawn. Placenta delivered spontaneously with gentle cord traction. Fundus firm with massage and Pitocin. Labia, perineum, vagina, and cervix inspected with no lacerations.   Placenta: spontaneous, intact, 3 vessel cord Complications: None Lacerations: None EBL: 303 Analgesia: None   Infant: APGAR (1 MIN): 7   APGAR (5 MINS): 9   APGAR (10 MINS):    Weight: pending  ViGifford ShaveMD  OB Fellow  01/16/2023 1:54 AM

## 2022-11-19 NOTE — Progress Notes (Signed)
Alert received through NCNotify system as follows: Patient identified as having high BP reading (134/92) on 11/07/2022  Chart review: History of chronic hypertension. Taking Norvasc 5 mg daily and labetalol 100 mg BID. Labs WNL on 11/07/22. Will return for routine OB on 11/28/21.  No action needed.  Annabell Howells, RN 11/19/2022  9:38 AM

## 2022-11-28 ENCOUNTER — Ambulatory Visit (INDEPENDENT_AMBULATORY_CARE_PROVIDER_SITE_OTHER): Payer: Medicaid Other | Admitting: Obstetrics and Gynecology

## 2022-11-28 ENCOUNTER — Other Ambulatory Visit: Payer: Self-pay

## 2022-11-28 VITALS — BP 112/68 | HR 105 | Wt 220.3 lb

## 2022-11-28 DIAGNOSIS — Z3A31 31 weeks gestation of pregnancy: Secondary | ICD-10-CM

## 2022-11-28 MED ORDER — FAMOTIDINE 20 MG PO TABS: 20.0000 mg | ORAL_TABLET | Freq: Two times a day (BID) | ORAL | 3 refills | Status: DC

## 2022-11-28 NOTE — Progress Notes (Signed)
    PRENATAL VISIT NOTE  Subjective:  Shirley Terrell is a 37 y.o. [phone] at [redacted]w[redacted]d being seen today for ongoing prenatal care.  She is currently monitored for the following issues for this high-risk pregnancy and has Chronic hypertension during pregnancy, antepartum; Essential hypertension; Obesity affecting pregnancy; Obesity, Class III, BMI 40-49.9 (morbid obesity) ([place]); OSA (obstructive sleep apnea); Tobacco abuse; S/p umbilical hernia mesh repair; Supervision of high risk pregnancy, antepartum; and Cervical dysplasia on their problem list.  Patient reports  gerd .  Contractions: Irregular. Vag. Bleeding: None.  Movement: Present. Denies leaking of fluid.   The following portions of the patient's history were reviewed and updated as appropriate: allergies, current medications, past family history, past medical history, past social history, past surgical history and problem list.   Objective:   Vitals:   11/28/22 1132  BP: 112/68  Pulse: (!) 105  Weight: 220 lb 4.8 oz (99.9 kg)    Fetal Status: Fetal Heart Rate (bpm): 144   Movement: Present     General:  Alert, oriented and cooperative. Patient is in no acute distress.  Skin: Skin is warm and dry. No rash noted.   Cardiovascular: Normal heart rate noted  Respiratory: Normal respiratory effort, no problems with respiration noted  Abdomen: Soft, gravid, appropriate for gestational age.  Pain/Pressure: Present     Pelvic: Cervical exam deferred        Extremities: Normal range of motion.  Edema: Trace  Mental Status: Normal mood and affect. Normal behavior. Normal judgment and thought content.   Assessment and Plan:  Pregnancy: [id number] at [redacted]w[redacted]d  1. Supervision of high risk pregnancy, antepartum   2. [redacted] weeks gestation of pregnancy Pepcid sent in. Normal 28wk labs BTL papers signed 12/27; patient aware only would be done during a c-section given h/o umbilical hernia repair with mesh. Other options d/w pt   3. Chronic  hypertension during pregnancy, antepartum On amlodipine 5 qday and labetalol 100 bid. Followed by [facility] cardiology. To start ap testing in a few w12/21: 1202g, 54%, ac 69%, afi wnl eeks.    4. Obstructive sleep apnea syndrome, mild Continue cpap usage   5. S/p umbilical hernia mesh repair See above   6. Obesity, Class III, BMI 40-49.9 (morbid obesity) (HCC)   7. Obesity affecting pregnancy in first trimester  Preterm labor symptoms and general obstetric precautions including but not limited to vaginal bleeding, contractions, leaking of fluid and fetal movement were reviewed in detail with the patient. Please refer to After Visit Summary for other counseling recommendations.   Return in about 2 weeks (around 12/12/2022) for in person, high risk ob, md or app.  Future Appointments  Date Time Provider [place]  12/03/2022  3:15 PM Leota Sauers, RD [place] NDM  12/05/2022  9:30 AM [facility] NURSE [facility]  12/05/2022  9:45 AM [facility] US7 [facility]  12/15/2022  8:15 AM Aletha Halim, MD [facility]  12/22/2022 10:40 AM Freada Bergeron, MD [facility]    Aletha Halim, MD

## 2022-12-01 NOTE — Progress Notes (Signed)
Alert received through NCNotify system as follows: Patient identified as having high BP reading (134/92) on 11/07/2022 with no recent follow-up  Chart review: Blood pressure was rechecked on 11/28/2022 BP was 112/68.  No action needed.  Martina Sinner, RN 12/01/2022  12:09 PM

## 2022-12-03 ENCOUNTER — Ambulatory Visit: Payer: Medicaid Other | Admitting: Registered"

## 2022-12-05 ENCOUNTER — Ambulatory Visit: Payer: Medicaid Other | Admitting: *Deleted

## 2022-12-05 ENCOUNTER — Ambulatory Visit: Payer: Medicaid Other | Attending: Maternal & Fetal Medicine

## 2022-12-05 VITALS — BP 120/68 | HR 97

## 2022-12-05 DIAGNOSIS — O10013 Pre-existing essential hypertension complicating pregnancy, third trimester: Secondary | ICD-10-CM

## 2022-12-05 DIAGNOSIS — O0990 Supervision of high risk pregnancy, unspecified, unspecified trimester: Secondary | ICD-10-CM | POA: Insufficient documentation

## 2022-12-05 DIAGNOSIS — E669 Obesity, unspecified: Secondary | ICD-10-CM | POA: Diagnosis not present

## 2022-12-05 DIAGNOSIS — O99212 Obesity complicating pregnancy, second trimester: Secondary | ICD-10-CM

## 2022-12-05 DIAGNOSIS — R638 Other symptoms and signs concerning food and fluid intake: Secondary | ICD-10-CM | POA: Insufficient documentation

## 2022-12-05 DIAGNOSIS — O10912 Unspecified pre-existing hypertension complicating pregnancy, second trimester: Secondary | ICD-10-CM | POA: Insufficient documentation

## 2022-12-05 DIAGNOSIS — O99332 Smoking (tobacco) complicating pregnancy, second trimester: Secondary | ICD-10-CM | POA: Diagnosis present

## 2022-12-05 DIAGNOSIS — Z3A32 32 weeks gestation of pregnancy: Secondary | ICD-10-CM

## 2022-12-05 DIAGNOSIS — O99213 Obesity complicating pregnancy, third trimester: Secondary | ICD-10-CM

## 2022-12-08 ENCOUNTER — Other Ambulatory Visit: Payer: Self-pay | Admitting: *Deleted

## 2022-12-08 DIAGNOSIS — O99213 Obesity complicating pregnancy, third trimester: Secondary | ICD-10-CM

## 2022-12-08 DIAGNOSIS — O10913 Unspecified pre-existing hypertension complicating pregnancy, third trimester: Secondary | ICD-10-CM

## 2022-12-12 ENCOUNTER — Ambulatory Visit: Payer: Medicaid Other | Attending: Maternal & Fetal Medicine

## 2022-12-12 ENCOUNTER — Ambulatory Visit: Payer: Medicaid Other | Admitting: *Deleted

## 2022-12-12 VITALS — BP 121/76 | HR 105

## 2022-12-12 DIAGNOSIS — O10913 Unspecified pre-existing hypertension complicating pregnancy, third trimester: Secondary | ICD-10-CM | POA: Diagnosis present

## 2022-12-12 DIAGNOSIS — O10013 Pre-existing essential hypertension complicating pregnancy, third trimester: Secondary | ICD-10-CM | POA: Diagnosis not present

## 2022-12-12 DIAGNOSIS — O99213 Obesity complicating pregnancy, third trimester: Secondary | ICD-10-CM | POA: Diagnosis not present

## 2022-12-12 DIAGNOSIS — Z3A33 33 weeks gestation of pregnancy: Secondary | ICD-10-CM

## 2022-12-12 DIAGNOSIS — O0990 Supervision of high risk pregnancy, unspecified, unspecified trimester: Secondary | ICD-10-CM | POA: Insufficient documentation

## 2022-12-12 DIAGNOSIS — E669 Obesity, unspecified: Secondary | ICD-10-CM

## 2022-12-15 ENCOUNTER — Ambulatory Visit (INDEPENDENT_AMBULATORY_CARE_PROVIDER_SITE_OTHER): Payer: Medicaid Other | Admitting: Obstetrics and Gynecology

## 2022-12-15 ENCOUNTER — Other Ambulatory Visit: Payer: Self-pay

## 2022-12-15 VITALS — BP 131/86 | HR 116 | Wt 220.0 lb

## 2022-12-15 DIAGNOSIS — Z3A33 33 weeks gestation of pregnancy: Secondary | ICD-10-CM

## 2022-12-15 DIAGNOSIS — O0993 Supervision of high risk pregnancy, unspecified, third trimester: Secondary | ICD-10-CM

## 2022-12-15 NOTE — Progress Notes (Signed)
   PRENATAL VISIT NOTE  Subjective:  Shirley Terrell is a 37 y.o. [phone] at [redacted]w[redacted]d being seen today for ongoing prenatal care.  She is currently monitored for the following issues for this high-risk pregnancy and has Chronic hypertension during pregnancy, antepartum; Essential hypertension; Obesity affecting pregnancy; Obesity, Class III, BMI 40-49.9 (morbid obesity) ([place]); OSA (obstructive sleep apnea); Tobacco abuse; S/p umbilical hernia mesh repair; Supervision of high risk pregnancy, antepartum; and Cervical dysplasia on their problem list.  Patient reports no complaints.  Contractions: Irritability. Vag. Bleeding: None.  Movement: Present. Denies leaking of fluid.   The following portions of the patient's history were reviewed and updated as appropriate: allergies, current medications, past family history, past medical history, past social history, past surgical history and problem list.   Objective:   Vitals:   12/15/22 0827  BP: 131/86  Pulse: (!) 116  Weight: 220 lb (99.8 kg)    Fetal Status: Fetal Heart Rate (bpm): 135   Movement: Present     General:  Alert, oriented and cooperative. Patient is in no acute distress.  Skin: Skin is warm and dry. No rash noted.   Cardiovascular: Normal heart rate noted  Respiratory: Normal respiratory effort, no problems with respiration noted  Abdomen: Soft, gravid, appropriate for gestational age.  Pain/Pressure: Present     Pelvic: Cervical exam deferred        Extremities: Normal range of motion.     Mental Status: Normal mood and affect. Normal behavior. Normal judgment and thought content.   Assessment and Plan:  Pregnancy: [id number] at [redacted]w[redacted]d 1. Supervision of high risk pregnancy, antepartum   2. [redacted] weeks gestation of pregnancy Pepcid sent in. Normal 28wk labs BTL papers signed 12/27; patient aware only would be done during a c-section given h/o umbilical hernia repair with mesh. Other options d/w pt   3. Chronic hypertension during  pregnancy, antepartum On amlodipine 5 qday and labetalol 100 bid. Followed by [facility] cardiology. I d/w her likely delivery timing to be 37/0-38/6 but will decided around 35wks Continue qwk testing 1/26: 8/8, ceph, 22; 1/10: 40%, 1889g, ac 68%   4. Obstructive sleep apnea syndrome, mild D/w her importance of getting machine and cpap usage   5. S/p umbilical hernia mesh repair See above   6. Obesity, Class III, BMI 40-49.9 (morbid obesity) (HCC)   7. Obesity affecting pregnancy in first trimester    Preterm labor symptoms and general obstetric precautions including but not limited to vaginal bleeding, contractions, leaking of fluid and fetal movement were reviewed in detail with the patient. Please refer to After Visit Summary for other counseling recommendations.   Return in 11 days (on 12/26/2022) for in person, md or app, high risk ob.  Future Appointments  Date Time Provider [place]  12/19/2022 10:30 AM [facility] NURSE [facility]  12/19/2022 10:45 AM [facility] US4 [facility]  12/22/2022 10:40 AM Freada Bergeron, MD [facility]  12/26/2022  9:30 AM [facility] NURSE [facility]  12/26/2022  9:45 AM [facility] US7 [facility]  01/02/2023 11:15 AM [facility] NURSE [facility]  01/02/2023 11:30 AM [facility] US3 [facility]  01/09/2023  9:30 AM [facility] NURSE [facility]  01/09/2023  9:45 AM [facility] US7 [facility]    Aletha Halim, MD

## 2022-12-16 ENCOUNTER — Inpatient Hospital Stay (HOSPITAL_COMMUNITY)
Admission: AD | Admit: 2022-12-16 | Discharge: 2022-12-21 | DRG: 832 | Disposition: A | Discharge status: Home / Self Care | Payer: Medicaid Other | Attending: Obstetrics and Gynecology | Admitting: Obstetrics and Gynecology

## 2022-12-16 ENCOUNTER — Inpatient Hospital Stay (HOSPITAL_COMMUNITY)
Admission: AD | Admit: 2022-12-16 | Discharge: 2022-12-16 | Disposition: A | Discharge status: Home / Self Care | Payer: Medicaid Other | Source: Home / Self Care | Attending: Obstetrics and Gynecology | Admitting: Obstetrics and Gynecology

## 2022-12-16 ENCOUNTER — Inpatient Hospital Stay (HOSPITAL_BASED_OUTPATIENT_CLINIC_OR_DEPARTMENT_OTHER): Payer: Medicaid Other

## 2022-12-16 ENCOUNTER — Encounter (HOSPITAL_COMMUNITY): Payer: Self-pay | Admitting: Obstetrics and Gynecology

## 2022-12-16 ENCOUNTER — Other Ambulatory Visit: Payer: Self-pay

## 2022-12-16 DIAGNOSIS — O4693 Antepartum hemorrhage, unspecified, third trimester: Secondary | ICD-10-CM

## 2022-12-16 DIAGNOSIS — J45909 Unspecified asthma, uncomplicated: Secondary | ICD-10-CM | POA: Diagnosis present

## 2022-12-16 DIAGNOSIS — Z7982 Long term (current) use of aspirin: Secondary | ICD-10-CM

## 2022-12-16 DIAGNOSIS — Z3A33 33 weeks gestation of pregnancy: Secondary | ICD-10-CM

## 2022-12-16 DIAGNOSIS — O4593 Premature separation of placenta, unspecified, third trimester: Principal | ICD-10-CM | POA: Diagnosis present

## 2022-12-16 DIAGNOSIS — O10013 Pre-existing essential hypertension complicating pregnancy, third trimester: Secondary | ICD-10-CM | POA: Diagnosis present

## 2022-12-16 DIAGNOSIS — O10913 Unspecified pre-existing hypertension complicating pregnancy, third trimester: Secondary | ICD-10-CM | POA: Insufficient documentation

## 2022-12-16 DIAGNOSIS — Z3A34 34 weeks gestation of pregnancy: Secondary | ICD-10-CM | POA: Diagnosis not present

## 2022-12-16 DIAGNOSIS — Z87891 Personal history of nicotine dependence: Secondary | ICD-10-CM

## 2022-12-16 DIAGNOSIS — O0990 Supervision of high risk pregnancy, unspecified, unspecified trimester: Secondary | ICD-10-CM

## 2022-12-16 DIAGNOSIS — O99513 Diseases of the respiratory system complicating pregnancy, third trimester: Secondary | ICD-10-CM | POA: Diagnosis present

## 2022-12-16 DIAGNOSIS — O4590 Premature separation of placenta, unspecified, unspecified trimester: Secondary | ICD-10-CM | POA: Diagnosis not present

## 2022-12-16 LAB — CBC
HCT: 27.1 % — ABNORMAL LOW (ref 36.0–46.0)
Hemoglobin: 9.3 g/dL — ABNORMAL LOW (ref 12.0–15.0)
MCH: 32.4 pg (ref 26.0–34.0)
MCHC: 34.3 g/dL (ref 30.0–36.0)
MCV: 94.4 fL (ref 80.0–100.0)
Platelets: 209 10*3/uL (ref 150–400)
RBC: 2.87 MIL/uL — ABNORMAL LOW (ref 3.87–5.11)
RDW: 12.6 % (ref 11.5–15.5)
WBC: 12.5 10*3/uL — ABNORMAL HIGH (ref 4.0–10.5)
nRBC: 0 % (ref 0.0–0.2)

## 2022-12-16 LAB — URINALYSIS, ROUTINE W REFLEX MICROSCOPIC
Bilirubin Urine: NEGATIVE
Glucose, UA: NEGATIVE mg/dL
Ketones, ur: 5 mg/dL — AB
Nitrite: NEGATIVE
Protein, ur: NEGATIVE mg/dL
Specific Gravity, Urine: 1.004 — ABNORMAL LOW (ref 1.005–1.030)
pH: 8 (ref 5.0–8.0)

## 2022-12-16 LAB — TYPE AND SCREEN
ABO/RH(D): O POS
Antibody Screen: NEGATIVE

## 2022-12-16 LAB — AMNISURE RUPTURE OF MEMBRANE (ROM) NOT AT ARMC: Amnisure ROM: NEGATIVE

## 2022-12-16 MED ORDER — LACTATED RINGERS IV SOLN: 125.0000 mL/h | INTRAVENOUS | Status: AC

## 2022-12-16 MED ORDER — ACETAMINOPHEN 325 MG PO TABS: 650.0000 mg | ORAL_TABLET | ORAL | Status: DC | PRN

## 2022-12-16 MED ORDER — LABETALOL HCL 100 MG PO TABS
100.0000 mg | ORAL_TABLET | Freq: Two times a day (BID) | ORAL | Status: DC
  Administered 2022-12-16 – 2022-12-21 (×10): 100 mg via ORAL
  Filled 2022-12-16 (×10): qty 1

## 2022-12-16 MED ORDER — LABETALOL HCL 100 MG PO TABS
100.0000 mg | ORAL_TABLET | Freq: Once | ORAL | Status: AC
  Administered 2022-12-16: 100 mg via ORAL
  Filled 2022-12-16: qty 1

## 2022-12-16 MED ORDER — AMLODIPINE BESYLATE 5 MG PO TABS
5.0000 mg | ORAL_TABLET | Freq: Every day | ORAL | Status: DC
  Administered 2022-12-16: 5 mg via ORAL
  Filled 2022-12-16: qty 1

## 2022-12-16 MED ORDER — DOCUSATE SODIUM 100 MG PO CAPS
100.0000 mg | ORAL_CAPSULE | Freq: Every day | ORAL | Status: DC
  Administered 2022-12-17 – 2022-12-21 (×3): 100 mg via ORAL
  Filled 2022-12-16 (×6): qty 1

## 2022-12-16 MED ORDER — AMLODIPINE BESYLATE 5 MG PO TABS
5.0000 mg | ORAL_TABLET | Freq: Every day | ORAL | Status: DC
  Administered 2022-12-17 – 2022-12-21 (×5): 5 mg via ORAL
  Filled 2022-12-16 (×5): qty 1

## 2022-12-16 MED ORDER — DOCUSATE SODIUM 100 MG PO CAPS: 100.0000 mg | ORAL_CAPSULE | Freq: Every day | ORAL | Status: DC

## 2022-12-16 MED ORDER — BETAMETHASONE SOD PHOS & ACET 6 (3-3) MG/ML IJ SUSP
12.0000 mg | INTRAMUSCULAR | Status: AC
  Administered 2022-12-16 – 2022-12-17 (×2): 12 mg via INTRAMUSCULAR
  Filled 2022-12-16: qty 5

## 2022-12-16 MED ORDER — LACTATED RINGERS IV SOLN: 125.0000 mL/h | INTRAVENOUS | Status: DC

## 2022-12-16 MED ORDER — BETAMETHASONE SOD PHOS & ACET 6 (3-3) MG/ML IJ SUSP: 12.0000 mg | INTRAMUSCULAR | Status: DC

## 2022-12-16 MED ORDER — LABETALOL HCL 100 MG PO TABS: 100.0000 mg | ORAL_TABLET | Freq: Two times a day (BID) | ORAL | Status: DC

## 2022-12-16 MED ORDER — CALCIUM CARBONATE ANTACID 500 MG PO CHEW: 2.0000 | CHEWABLE_TABLET | ORAL | Status: DC | PRN

## 2022-12-16 MED ORDER — PRENATAL MULTIVITAMIN CH
1.0000 | ORAL_TABLET | Freq: Every day | ORAL | Status: DC
  Administered 2022-12-16 – 2022-12-21 (×6): 1 via ORAL
  Filled 2022-12-16 (×6): qty 1

## 2022-12-16 MED ORDER — CALCIUM CARBONATE ANTACID 500 MG PO CHEW
2.0000 | CHEWABLE_TABLET | ORAL | Status: DC | PRN
  Administered 2022-12-17 – 2022-12-20 (×6): 400 mg via ORAL
  Filled 2022-12-16 (×6): qty 2

## 2022-12-16 MED ORDER — AMLODIPINE BESYLATE 5 MG PO TABS: 5.0000 mg | ORAL_TABLET | Freq: Every day | ORAL | Status: DC

## 2022-12-16 MED ORDER — ACETAMINOPHEN 325 MG PO TABS
650.0000 mg | ORAL_TABLET | ORAL | Status: DC | PRN
  Administered 2022-12-19: 650 mg via ORAL
  Filled 2022-12-16: qty 2

## 2022-12-16 MED ORDER — PRENATAL MULTIVITAMIN CH: 1.0000 | ORAL_TABLET | Freq: Every day | ORAL | Status: DC

## 2022-12-16 NOTE — H&P (Incomplete Revision)
History     CSN: [id number]  Arrival date and time: 12/16/22 0903   None     Chief Complaint  Patient presents with   Vaginal Bleeding   HPI  37 y.o. [id number] @ [redacted]w[redacted]d gestation presents for vaginal bleeding that started this morning around 5AM. States she felt a gush of blood similar to a period when she went to urinate. Since that time she has had spotting described as pink tinged urine and blood noted when wiping. +FM. Denies cramping, abdominal pain, dysuria or vaginal discharge. Denies increased activity or sexual intercourse in the last couple days.  States she did not taking her BP meds this AM due to bleeding concerns but otherwise compliant on Amlodipine 5mg  and Labetalol 100mg  BID with avg BP 120/80s at home. Denies HA, vision change or RUQ pain.  OB History     Gravida  4   Para  2   Term  2   Preterm      AB  1   Living  2      SAB  1   IAB      Ectopic      Multiple  0   Live Births  2           Past Medical History:  Diagnosis Date   Anemia    takes iron supplement   Asthma    prn inhaler   Hypertension    states BP normalized while pregnant and since delivery; is currently not on med.   Lisfranc dislocation 11/30/2016   left   Pregnancy induced hypertension    with 2nd preg   Sleep apnea    uses cpap    Past Surgical History:  Procedure Laterality Date   CHOLECYSTECTOMY  04/13/2012   Procedure: LAPAROSCOPIC CHOLECYSTECTOMY WITH INTRAOPERATIVE CHOLANGIOGRAM;  Surgeon: Odis Hollingshead, MD;  Location: [place];  Service: General;  Laterality: N/A;  laparoscopic cholecystectomy with IOC   INCISIONAL HERNIA REPAIR  08/13/2018   LAPROSCOPIC   INCISIONAL HERNIA REPAIR N/A 08/13/2018   Procedure: LAPAROSCOPIC INCISIONAL HERNIA REPAIR;  Surgeon: Clovis Riley, MD;  Location: [place];  Service: General;  Laterality: N/A;   INSERTION OF MESH N/A 08/13/2018   Procedure: INSERTION OF MESH;  Surgeon: Clovis Riley, MD;  Location: [place];   Service: General;  Laterality: N/A;   OPEN REDUCTION INTERNAL FIXATION (ORIF) FOOT LISFRANC FRACTURE Left 12/18/2016   Procedure: OPEN REDUCTION INTERNAL FIXATION (ORIF) FOOT LISFRANC FRACTURE;  Surgeon: Wylene Simmer, MD;  Location: [place];  Service: Orthopedics;  Laterality: Left;    Family History  Problem Relation Age of Onset   Hypertension Mother    Stroke Mother    Renal Disease Mother        due to blood pressure   Heart disease Father        MI, no stents   Depression Father    Hypertension Father    Stroke Father    Healthy Sister    Healthy Sister    Healthy Sister    Healthy Sister    Asthma Daughter    Hypertension Maternal Grandmother    Asthma Paternal Grandmother    Cancer Paternal Grandmother        breast   Diabetes Paternal Grandmother    Cancer Paternal Grandfather    Diabetes Other     Social History   Tobacco Use   Smoking status: Former    Packs/day: 0.00    Years:  3.00    Total pack years: 0.00    Types: Cigarettes    Quit date: 12/14/2017    Years since quitting: 5.0   Smokeless tobacco: Never   Tobacco comments:    6 cigarettes/day  Vaping Use   Vaping Use: Never used  Substance Use Topics   Alcohol use: Not Currently    Comment: occasionally   Drug use: No    Allergies: No Known Allergies  Medications Prior to Admission  Medication Sig Dispense Refill Last Dose   amLODipine (NORVASC) 5 MG tablet Take 1 tablet (5 mg total) by mouth daily. 90 tablet 3 12/15/2022   aspirin EC 81 MG tablet Take 1 tablet (81 mg total) by mouth daily. 60 tablet 1 12/15/2022   calcium carbonate (TUMS - DOSED IN MG ELEMENTAL CALCIUM) 500 MG chewable tablet Chew 1 tablet by mouth daily.   12/15/2022   famotidine (PEPCID) 20 MG tablet Take 1 tablet (20 mg total) by mouth 2 (two) times daily. 60 tablet 3 12/15/2022   labetalol (NORMODYNE) 100 MG tablet Take 1 tablet (100 mg total) by mouth 2 (two) times daily. 180 tablet 3 12/15/2022   Prenatal  Vit-Fe Fumarate-FA (PRENATAL VITAMINS PO) Take 1 tablet by mouth daily.   12/15/2022   albuterol (VENTOLIN HFA) 108 (90 Base) MCG/ACT inhaler Inhale 2 puffs into the lungs every 6 (six) hours as needed for wheezing. 18 g 0    Blood Pressure Monitor DEVI Please provide patient with insurance approved blood pressure monitor 1 Device 0    Blood Pressure Monitoring (BLOOD PRESSURE KIT) DEVI 1 Device by Does not apply route as needed. 1 each 0     Review of Systems GYN: Positive for vaginal bleeding  Physical Exam   Blood pressure (!) 142/97, pulse (!) 115, temperature 98.2 F (36.8 C), temperature source Oral, resp. rate 14, last menstrual period 04/25/2022, SpO2 99 %, unknown if currently breastfeeding.  Physical Exam GENERAL: Well-developed, well-nourished female in no acute distress.  HEAD: Normocephalic, atraumatic.  CV: Tachycardia. Regular rhythm. No murmurs Resp: CTAB. Normal WOB on RA ABDOMEN: Soft, nontender, gravid PELVIC: Normal external female genitalia. Vagina is pink and rugated. Cervix with normal contour, no lesions or active bleeding. Mild amount of dark red blood in vaginal vault.  Fetal Monitoring: Baseline: 135 Variability: Mild Accelerations: 10x10 Decelerations: None Contractions: None  MAU Course  Procedures  MDM Reviewed community prenatal records. 3559: Dark red blood in the vagina upon speculum exam. AmniSure performed. Ordered limited OB US.  1040: AmniSure negative. [place] reassuring and does not indicate any signs of placental abruption or previa.  1050: Discussed case with attending Dr. Elly Modena, recommended 23 hours obs to monitor FWB and additional episodes of bleeding. Admission and BMZ orders placed.  Assessment and Plan   [redacted] weeks gestation of pregnancy Vaginal bleeding 3.   Patient to return for obs, [place] course once she has settled her children with friends and family  Mallie Snooks, [place], MSN, CNM Certified Nurse Midwife, [employer] for [employer], [place]

## 2022-12-16 NOTE — MAU Provider Note (Signed)
History     CSN: [id number]  Arrival date and time: 12/16/22 4709   Event Date/Time   First Provider Initiated Contact with Patient 12/16/22 [phone]      Chief Complaint  Patient presents with   Vaginal Bleeding   HPI 37 y.o. [id number] @ [redacted]w[redacted]d gestation presents for vaginal bleeding that started this morning around 5AM. States she felt a gush of blood similar to a period when she went to urinate. Since that time she has had spotting described as pink tinged urine and blood noted when wiping. +FM. Denies cramping, abdominal pain, dysuria or vaginal discharge. Denies increased activity or sexual intercourse in the last couple days.   States she did not taking her BP meds this AM due to bleeding concerns but otherwise compliant on Amlodipine 5mg  and Labetalol 100mg  BID with avg BP 120/80s at home. Denies HA, vision change or RUQ pain.   OB History     Gravida  4   Para  2   Term  2   Preterm      AB  1   Living  2      SAB  1   IAB      Ectopic      Multiple  0   Live Births  2           Past Medical History:  Diagnosis Date   Anemia    takes iron supplement   Asthma    prn inhaler   Hypertension    states BP normalized while pregnant and since delivery; is currently not on med.   Lisfranc dislocation 11/30/2016   left   Pregnancy induced hypertension    with 2nd preg   Sleep apnea    uses cpap    Past Surgical History:  Procedure Laterality Date   CHOLECYSTECTOMY  04/13/2012   Procedure: LAPAROSCOPIC CHOLECYSTECTOMY WITH INTRAOPERATIVE CHOLANGIOGRAM;  Surgeon: Odis Hollingshead, MD;  Location: [place];  Service: General;  Laterality: N/A;  laparoscopic cholecystectomy with IOC   INCISIONAL HERNIA REPAIR  08/13/2018   LAPROSCOPIC   INCISIONAL HERNIA REPAIR N/A 08/13/2018   Procedure: LAPAROSCOPIC INCISIONAL HERNIA REPAIR;  Surgeon: Clovis Riley, MD;  Location: [place];  Service: General;  Laterality: N/A;   INSERTION OF MESH N/A 08/13/2018    Procedure: INSERTION OF MESH;  Surgeon: Clovis Riley, MD;  Location: [place];  Service: General;  Laterality: N/A;   OPEN REDUCTION INTERNAL FIXATION (ORIF) FOOT LISFRANC FRACTURE Left 12/18/2016   Procedure: OPEN REDUCTION INTERNAL FIXATION (ORIF) FOOT LISFRANC FRACTURE;  Surgeon: Wylene Simmer, MD;  Location: [place];  Service: Orthopedics;  Laterality: Left;    Family History  Problem Relation Age of Onset   Hypertension Mother    Stroke Mother    Renal Disease Mother        due to blood pressure   Heart disease Father        MI, no stents   Depression Father    Hypertension Father    Stroke Father    Healthy Sister    Healthy Sister    Healthy Sister    Healthy Sister    Asthma Daughter    Hypertension Maternal Grandmother    Asthma Paternal Grandmother    Cancer Paternal Grandmother        breast   Diabetes Paternal Grandmother    Cancer Paternal Grandfather    Diabetes Other     Social History   Tobacco Use  Smoking status: Former    Packs/day: 0.00    Years: 3.00    Total pack years: 0.00    Types: Cigarettes    Quit date: 12/14/2017    Years since quitting: 5.0   Smokeless tobacco: Never   Tobacco comments:    6 cigarettes/day  Vaping Use   Vaping Use: Never used  Substance Use Topics   Alcohol use: Not Currently    Comment: occasionally   Drug use: No    Allergies: No Known Allergies  Medications Prior to Admission  Medication Sig Dispense Refill Last Dose   amLODipine (NORVASC) 5 MG tablet Take 1 tablet (5 mg total) by mouth daily. 90 tablet 3 12/15/2022   aspirin EC 81 MG tablet Take 1 tablet (81 mg total) by mouth daily. 60 tablet 1 12/15/2022   calcium carbonate (TUMS - DOSED IN MG ELEMENTAL CALCIUM) 500 MG chewable tablet Chew 1 tablet by mouth daily.   12/15/2022   famotidine (PEPCID) 20 MG tablet Take 1 tablet (20 mg total) by mouth 2 (two) times daily. 60 tablet 3 12/15/2022   labetalol (NORMODYNE) 100 MG tablet Take 1 tablet  (100 mg total) by mouth 2 (two) times daily. 180 tablet 3 12/15/2022   Prenatal Vit-Fe Fumarate-FA (PRENATAL VITAMINS PO) Take 1 tablet by mouth daily.   12/15/2022   albuterol (VENTOLIN HFA) 108 (90 Base) MCG/ACT inhaler Inhale 2 puffs into the lungs every 6 (six) hours as needed for wheezing. 18 g 0    Blood Pressure Monitor DEVI Please provide patient with insurance approved blood pressure monitor 1 Device 0    Blood Pressure Monitoring (BLOOD PRESSURE KIT) DEVI 1 Device by Does not apply route as needed. 1 each 0     Review of Systems GYN: Positive for vaginal bleeding Physical Exam   Blood pressure (!) 152/102, pulse 98, temperature 98.2 F (36.8 C), temperature source Oral, resp. rate 14, last menstrual period 04/25/2022, SpO2 99 %, unknown if currently breastfeeding.   Physical Exam GENERAL: Well-developed, well-nourished female in no acute distress.  HEAD: Normocephalic, atraumatic.  CV: Tachycardia. Regular rhythm. No murmurs Resp: CTAB. Normal WOB on RA ABDOMEN: Soft, nontender, gravid PELVIC: Normal external female genitalia. Vagina is pink and rugated. Cervix with normal contour, no lesions or active bleeding. Mild amount of dark red blood in vaginal vault.   Fetal Monitoring: Baseline: 135 Variability: Mild Accelerations: 10x10 Decelerations: None Contractions: None MAU Course  Procedures  MDM Orders Placed This Encounter  Procedures   [place] MFM OB Limited   Urinalysis, Routine w reflex microscopic -Urine, Clean Catch   Amnisure rupture of membrane (rom)not at [facility]   RPR   CBC   Diet regular Room service appropriate? Yes; Fluid consistency: Thin   Notify physician (specify)   Vital signs   Defer vaginal exam for vaginal bleeding or PROM <37 weeks   Apply Antepartum Care Plan   Initiate Oral Care Protocol   Initiate Carrier Fluid Protocol   Place TED hose   SCDs   Fetal monitoring   Bed rest with bathroom privileges   Full code   Type and screen [place] peripheral IV   Discharge patient   Patient Vitals for the past 24 hrs:  BP Temp Temp src Pulse Resp SpO2  12/16/22 1046 (!) 143/92 -- -- 100 -- --  12/16/22 1031 (!) 152/102 -- -- 98 -- --  12/16/22 1018 (!) 153/94 -- -- 95 -- --  12/16/22 0920 Marland Kitchen)  142/97 98.2 F (36.8 C) Oral (!) 115 14 99 %    Meds ordered this encounter  Medications   amLODipine (NORVASC) tablet 5 mg   labetalol (NORMODYNE) tablet 100 mg   DISCONTD: amLODipine (NORVASC) tablet 5 mg   labetalol (NORMODYNE) tablet 100 mg   lactated ringers infusion   acetaminophen (TYLENOL) tablet 650 mg   docusate sodium (COLACE) capsule 100 mg   calcium carbonate (TUMS - dosed in mg elemental calcium) chewable tablet 400 mg of elemental calcium   prenatal multivitamin tablet 1 tablet   betamethasone acetate-betamethasone sodium phosphate (CELESTONE) injection 12 mg    Assessment and Plan  --37 y.o. [id number] at [redacted]w[redacted]d  --Bleeding in third trimester --Reactive tracing --No acute findings on MFM OB Limited --CHTN on medication, had not taken prior to arrival --Per Dr. Elly Modena, admit to [facility] --Patient agreeable to admission, needs time to settle her children with relatives. She will return in about 2 hours --Discharge home in stable condition  Darlina Rumpf, [place], MSN, CNM 12/16/2022, 11:19 AM

## 2022-12-16 NOTE — H&P (Signed)
History     CSN: [id number]  Arrival date and time: 12/16/22 0903   None     Chief Complaint  Patient presents with   Vaginal Bleeding   HPI  37 y.o. [id number] @ [redacted]w[redacted]d gestation presents for vaginal bleeding that started this morning around 5AM. States she felt a gush of blood similar to a period when she went to urinate. Since that time she has had spotting described as pink tinged urine and blood noted when wiping. +FM. Denies cramping, abdominal pain, dysuria or vaginal discharge. Denies increased activity or sexual intercourse in the last couple days.  States she did not taking her BP meds this AM due to bleeding concerns but otherwise compliant on Amlodipine 5mg  and Labetalol 100mg  BID with avg BP 120/80s at home. Denies HA, vision change or RUQ pain.  OB History     Gravida  4   Para  2   Term  2   Preterm      AB  1   Living  2      SAB  1   IAB      Ectopic      Multiple  0   Live Births  2           Past Medical History:  Diagnosis Date   Anemia    takes iron supplement   Asthma    prn inhaler   Hypertension    states BP normalized while pregnant and since delivery; is currently not on med.   Lisfranc dislocation 11/30/2016   left   Pregnancy induced hypertension    with 2nd preg   Sleep apnea    uses cpap    Past Surgical History:  Procedure Laterality Date   CHOLECYSTECTOMY  04/13/2012   Procedure: LAPAROSCOPIC CHOLECYSTECTOMY WITH INTRAOPERATIVE CHOLANGIOGRAM;  Surgeon: Odis Hollingshead, MD;  Location: [facility];  Service: General;  Laterality: N/A;  laparoscopic cholecystectomy with IOC   INCISIONAL HERNIA REPAIR  08/13/2018   LAPROSCOPIC   INCISIONAL HERNIA REPAIR N/A 08/13/2018   Procedure: LAPAROSCOPIC INCISIONAL HERNIA REPAIR;  Surgeon: Clovis Riley, MD;  Location: [facility];  Service: General;  Laterality: N/A;   INSERTION OF MESH N/A 08/13/2018   Procedure: INSERTION OF MESH;  Surgeon: Clovis Riley, MD;  Location: [facility];   Service: General;  Laterality: N/A;   OPEN REDUCTION INTERNAL FIXATION (ORIF) FOOT LISFRANC FRACTURE Left 12/18/2016   Procedure: OPEN REDUCTION INTERNAL FIXATION (ORIF) FOOT LISFRANC FRACTURE;  Surgeon: Wylene Simmer, MD;  Location: [facility];  Service: Orthopedics;  Laterality: Left;    Family History  Problem Relation Age of Onset   Hypertension Mother    Stroke Mother    Renal Disease Mother        due to blood pressure   Heart disease Father        MI, no stents   Depression Father    Hypertension Father    Stroke Father    Healthy Sister    Healthy Sister    Healthy Sister    Healthy Sister    Asthma Daughter    Hypertension Maternal Grandmother    Asthma Paternal Grandmother    Cancer Paternal Grandmother        breast   Diabetes Paternal Grandmother    Cancer Paternal Grandfather    Diabetes Other     Social History   Tobacco Use   Smoking status: Former    Packs/day: 0.00    Years:  3.00    Total pack years: 0.00    Types: Cigarettes    Quit date: 12/14/2017    Years since quitting: 5.0   Smokeless tobacco: Never   Tobacco comments:    6 cigarettes/day  Vaping Use   Vaping Use: Never used  Substance Use Topics   Alcohol use: Not Currently    Comment: occasionally   Drug use: No    Allergies: No Known Allergies  Medications Prior to Admission  Medication Sig Dispense Refill Last Dose   amLODipine (NORVASC) 5 MG tablet Take 1 tablet (5 mg total) by mouth daily. 90 tablet 3 12/15/2022   aspirin EC 81 MG tablet Take 1 tablet (81 mg total) by mouth daily. 60 tablet 1 12/15/2022   calcium carbonate (TUMS - DOSED IN MG ELEMENTAL CALCIUM) 500 MG chewable tablet Chew 1 tablet by mouth daily.   12/15/2022   famotidine (PEPCID) 20 MG tablet Take 1 tablet (20 mg total) by mouth 2 (two) times daily. 60 tablet 3 12/15/2022   labetalol (NORMODYNE) 100 MG tablet Take 1 tablet (100 mg total) by mouth 2 (two) times daily. 180 tablet 3 12/15/2022   Prenatal  Vit-Fe Fumarate-FA (PRENATAL VITAMINS PO) Take 1 tablet by mouth daily.   12/15/2022   albuterol (VENTOLIN HFA) 108 (90 Base) MCG/ACT inhaler Inhale 2 puffs into the lungs every 6 (six) hours as needed for wheezing. 18 g 0    Blood Pressure Monitor DEVI Please provide patient with insurance approved blood pressure monitor 1 Device 0    Blood Pressure Monitoring (BLOOD PRESSURE KIT) DEVI 1 Device by Does not apply route as needed. 1 each 0     Review of Systems GYN: Positive for vaginal bleeding  Physical Exam   Blood pressure (!) 142/97, pulse (!) 115, temperature 98.2 F (36.8 C), temperature source Oral, resp. rate 14, last menstrual period 04/25/2022, SpO2 99 %, unknown if currently breastfeeding.  Physical Exam GENERAL: Well-developed, well-nourished female in no acute distress.  HEAD: Normocephalic, atraumatic.  CV: Tachycardia. Regular rhythm. No murmurs Resp: CTAB. Normal WOB on RA ABDOMEN: Soft, nontender, gravid PELVIC: Normal external female genitalia. Vagina is pink and rugated. Cervix with normal contour, no lesions or active bleeding. Mild amount of dark red blood in vaginal vault.  Fetal Monitoring: Baseline: 135 Variability: Mild Accelerations: 10x10 Decelerations: None Contractions: None    Assessment and Plan   [redacted] weeks gestation of pregnancy Vaginal bleeding 3.   Will admit for observation and BMZ

## 2022-12-16 NOTE — H&P (Addendum)
History     CSN: [id number]  Arrival date and time: 12/16/22 0903   None     Chief Complaint  Patient presents with   Vaginal Bleeding   HPI  37 y.o. [id number] @ [redacted]w[redacted]d gestation presents for vaginal bleeding that started this morning around 5AM. States she felt a gush of blood similar to a period when she went to urinate. Since that time she has had spotting described as pink tinged urine and blood noted when wiping. +FM. Denies cramping, abdominal pain, dysuria or vaginal discharge. Denies increased activity or sexual intercourse in the last couple days.  States she did not taking her BP meds this AM due to bleeding concerns but otherwise compliant on Amlodipine 5mg  and Labetalol 100mg  BID with avg BP 120/80s at home. Denies HA, vision change or RUQ pain.  OB History     Gravida  4   Para  2   Term  2   Preterm      AB  1   Living  2      SAB  1   IAB      Ectopic      Multiple  0   Live Births  2           Past Medical History:  Diagnosis Date   Anemia    takes iron supplement   Asthma    prn inhaler   Hypertension    states BP normalized while pregnant and since delivery; is currently not on med.   Lisfranc dislocation 11/30/2016   left   Pregnancy induced hypertension    with 2nd preg   Sleep apnea    uses cpap    Past Surgical History:  Procedure Laterality Date   CHOLECYSTECTOMY  04/13/2012   Procedure: LAPAROSCOPIC CHOLECYSTECTOMY WITH INTRAOPERATIVE CHOLANGIOGRAM;  Surgeon: Odis Hollingshead, MD;  Location: [facility];  Service: General;  Laterality: N/A;  laparoscopic cholecystectomy with IOC   INCISIONAL HERNIA REPAIR  08/13/2018   LAPROSCOPIC   INCISIONAL HERNIA REPAIR N/A 08/13/2018   Procedure: LAPAROSCOPIC INCISIONAL HERNIA REPAIR;  Surgeon: Clovis Riley, MD;  Location: [facility];  Service: General;  Laterality: N/A;   INSERTION OF MESH N/A 08/13/2018   Procedure: INSERTION OF MESH;  Surgeon: Clovis Riley, MD;  Location: [facility];   Service: General;  Laterality: N/A;   OPEN REDUCTION INTERNAL FIXATION (ORIF) FOOT LISFRANC FRACTURE Left 12/18/2016   Procedure: OPEN REDUCTION INTERNAL FIXATION (ORIF) FOOT LISFRANC FRACTURE;  Surgeon: Wylene Simmer, MD;  Location: [facility];  Service: Orthopedics;  Laterality: Left;    Family History  Problem Relation Age of Onset   Hypertension Mother    Stroke Mother    Renal Disease Mother        due to blood pressure   Heart disease Father        MI, no stents   Depression Father    Hypertension Father    Stroke Father    Healthy Sister    Healthy Sister    Healthy Sister    Healthy Sister    Asthma Daughter    Hypertension Maternal Grandmother    Asthma Paternal Grandmother    Cancer Paternal Grandmother        breast   Diabetes Paternal Grandmother    Cancer Paternal Grandfather    Diabetes Other     Social History   Tobacco Use   Smoking status: Former    Packs/day: 0.00    Years:  3.00    Total pack years: 0.00    Types: Cigarettes    Quit date: 12/14/2017    Years since quitting: 5.0   Smokeless tobacco: Never   Tobacco comments:    6 cigarettes/day  Vaping Use   Vaping Use: Never used  Substance Use Topics   Alcohol use: Not Currently    Comment: occasionally   Drug use: No    Allergies: No Known Allergies  Medications Prior to Admission  Medication Sig Dispense Refill Last Dose   amLODipine (NORVASC) 5 MG tablet Take 1 tablet (5 mg total) by mouth daily. 90 tablet 3 12/15/2022   aspirin EC 81 MG tablet Take 1 tablet (81 mg total) by mouth daily. 60 tablet 1 12/15/2022   calcium carbonate (TUMS - DOSED IN MG ELEMENTAL CALCIUM) 500 MG chewable tablet Chew 1 tablet by mouth daily.   12/15/2022   famotidine (PEPCID) 20 MG tablet Take 1 tablet (20 mg total) by mouth 2 (two) times daily. 60 tablet 3 12/15/2022   labetalol (NORMODYNE) 100 MG tablet Take 1 tablet (100 mg total) by mouth 2 (two) times daily. 180 tablet 3 12/15/2022   Prenatal  Vit-Fe Fumarate-FA (PRENATAL VITAMINS PO) Take 1 tablet by mouth daily.   12/15/2022   albuterol (VENTOLIN HFA) 108 (90 Base) MCG/ACT inhaler Inhale 2 puffs into the lungs every 6 (six) hours as needed for wheezing. 18 g 0    Blood Pressure Monitor DEVI Please provide patient with insurance approved blood pressure monitor 1 Device 0    Blood Pressure Monitoring (BLOOD PRESSURE KIT) DEVI 1 Device by Does not apply route as needed. 1 each 0     Review of Systems GYN: Positive for vaginal bleeding  Physical Exam   Blood pressure (!) 142/97, pulse (!) 115, temperature 98.2 F (36.8 C), temperature source Oral, resp. rate 14, last menstrual period 04/25/2022, SpO2 99 %, unknown if currently breastfeeding.  Physical Exam GENERAL: Well-developed, well-nourished female in no acute distress.  HEAD: Normocephalic, atraumatic.  CV: Tachycardia. Regular rhythm. No murmurs Resp: CTAB. Normal WOB on RA ABDOMEN: Soft, nontender, gravid PELVIC: Normal external female genitalia. Vagina is pink and rugated. Cervix with normal contour, no lesions or active bleeding. Mild amount of dark red blood in vaginal vault.  Fetal Monitoring: Baseline: 135 Variability: Mild Accelerations: 10x10 Decelerations: None Contractions: None  MAU Course  Procedures  MDM Reviewed community prenatal records. 0626: Dark red blood in the vagina upon speculum exam. AmniSure performed. Ordered limited OB US.  1040: AmniSure negative. [place] reassuring and does not indicate any signs of placental abruption or previa.  1050: Discussed case with attending Dr. Elly Modena, recommended 23 hours obs to monitor FWB and additional episodes of bleeding. Admission and BMZ orders placed.  Assessment and Plan   [redacted] weeks gestation of pregnancy Vaginal bleeding  Patient to be admitted for obs. May discharge to care for other children and then return for overnight stay.   Shirley Terrell 12/16/2022, 9:22 AM

## 2022-12-16 NOTE — MAU Note (Addendum)
.  Shirley Terrell is a 37 y.o. at [redacted]w[redacted]d here in MAU reporting: around 0530 this morning she had a gush of pink/red blood like the beginning of a period and has been spotting since then, has not passed any clots. She was seen yesterday in the office but did not have an SVE and denies recent intercourse. No LOF. +FM. Was having some cramping earlier but denies pain currently. Also has not taken her BP meds today, denies PIH s/sx.  Onset of complaint: 0530 Pain score: 0 FHT:+FM Lab orders placed from triage:  UA

## 2022-12-17 ENCOUNTER — Encounter (HOSPITAL_COMMUNITY): Payer: Self-pay | Admitting: Obstetrics and Gynecology

## 2022-12-17 DIAGNOSIS — Z3A33 33 weeks gestation of pregnancy: Secondary | ICD-10-CM

## 2022-12-17 DIAGNOSIS — O4590 Premature separation of placenta, unspecified, unspecified trimester: Principal | ICD-10-CM | POA: Diagnosis present

## 2022-12-17 DIAGNOSIS — O10913 Unspecified pre-existing hypertension complicating pregnancy, third trimester: Secondary | ICD-10-CM

## 2022-12-17 DIAGNOSIS — O4593 Premature separation of placenta, unspecified, third trimester: Secondary | ICD-10-CM

## 2022-12-17 LAB — RPR: RPR Ser Ql: NONREACTIVE

## 2022-12-17 MED ORDER — SODIUM CHLORIDE 0.9 % IV SOLN
500.0000 mg | Freq: Once | INTRAVENOUS | Status: AC
  Administered 2022-12-17: 500 mg via INTRAVENOUS
  Filled 2022-12-17: qty 460

## 2022-12-17 NOTE — Progress Notes (Signed)
[facility] COMPREHENSIVE PROGRESS NOTE  NYLANI MICHETTI is a 37 y.o. [phone] at [redacted]w[redacted]d who is admitted for vaginal bleeding in third trimester.  Estimated Date of Delivery: 01/30/23 Fetal presentation is cephalic.(1/30)  Length of Stay:  1 Days. Admitted 12/16/2022  Subjective: Bleeding has decreased, now just present with wiping.  Patient reports good fetal movement.  She reports no uterine contractions and no loss of fluid per vagina.  Vitals:  Blood pressure 123/66, pulse 96, temperature 98.1 F (36.7 C), temperature source Oral, resp. rate 17, height 5\' 3"  (1.6 m), weight 99.8 kg, last menstrual period 04/25/2022, SpO2 98 %, unknown if currently breastfeeding. Physical Examination: CONSTITUTIONAL: Well-developed, well-nourished female in no acute distress.  NEUROLOGIC: Alert and oriented to person, place, and time. No cranial nerve deficit noted. PSYCHIATRIC: Normal mood and affect. Normal behavior. Normal judgment and thought content. CARDIOVASCULAR: Normal heart rate noted, regular rhythm RESPIRATORY: Effort normal, no problems with respiration noted MUSCULOSKELETAL: Normal range of motion. No edema and no tenderness. 2+ distal pulses. ABDOMEN: Soft, nontender, nondistended, gravid. CERVIX:  Deferred  Fetal monitoring: FHR: 140 bpm, Variability: moderate, Accelerations: Present, Decelerations: Absent  Uterine activity: 0 contractions per hour  Results for orders placed or performed during the hospital encounter of 12/16/22 (from the past 48 hour(s))  CBC     Status: Abnormal   Collection Time: 12/16/22  1:59 PM  Result Value Ref Range   WBC 12.5 (H) 4.0 - 10.5 K/uL   RBC 2.87 (L) 3.87 - 5.11 MIL/uL   Hemoglobin 9.3 (L) 12.0 - 15.0 g/dL   HCT 27.1 (L) 36.0 - 46.0 %   MCV 94.4 80.0 - 100.0 fL   MCH 32.4 26.0 - 34.0 pg   MCHC 34.3 30.0 - 36.0 g/dL   RDW 12.6 11.5 - 15.5 %   Platelets 209 150 - 400 K/uL   nRBC 0.0 0.0 - 0.2 %    Comment: Performed at [facility], [place] [address], [place], [place] [address]   [place] MFM OB Limited  Result Date: 12/16/2022 ----------------------------------------------------------------------  OBSTETRICS REPORT                       (Signed Final 12/16/2022 11:31 am) ---------------------------------------------------------------------- Patient Info  ID #:       [id number]                          D.O.B.:  [birth year] (36 yrs)  Name:       ADDALYN SPEEDY Libman                Visit Date: 12/16/2022 10:08 am ---------------------------------------------------------------------- Performed By  Attending:        Valeda Malm DO       Secondary Phy.:   [facility] MAU/Triage  Performed By:     Berlinda Last          [facility]                    [place]  Referred By:      Crissie Figures  WEINHOLD ---------------------------------------------------------------------- Orders  #  Description                           Code        Ordered By  1  [place] MFM OB LIMITED                     31540.08    Mallie Snooks ----------------------------------------------------------------------  #  Order #                     Accession #                Episode #  1  [id number]                   [id number]                 [id number] ---------------------------------------------------------------------- Indications  Vaginal bleeding in pregnancy, third trimester O46.93  [redacted] weeks gestation of pregnancy                Z3A.33 ---------------------------------------------------------------------- Fetal Evaluation  Num Of Fetuses:         1  Fetal Heart Rate(bpm):  144  Cardiac Activity:       Observed  Presentation:           Cephalic  Placenta:               Right lateral  P. Cord Insertion:      Visualized  Amniotic Fluid  AFI FV:      Within normal limits  AFI Sum(cm)     %Tile       Largest Pocket(cm)  16.5            60          5.5   RUQ(cm)       RLQ(cm)       LUQ(cm)        LLQ(cm)  2.8           5.4           2.8            5.5  Comment:    No placental abruption or previa identified. ---------------------------------------------------------------------- OB History  Gravidity:    4         Term:   2        Prem:   0        SAB:   1  TOP:          0       Ectopic:  0        Living: 2 ---------------------------------------------------------------------- Gestational Age  LMP:           33w 4d        Date:  04/25/22                   EDD:   01/30/23  Best:          33w 4d     Det. By:  LMP  (04/25/22)  EDD:   01/30/23 ---------------------------------------------------------------------- Anatomy  Cranium:               Appears normal         Stomach:                Appears normal, left                                                                        sided  Ventricles:            Appears normal         Kidneys:                Appear normal  Diaphragm:             Appears normal         Bladder:                Appears normal ---------------------------------------------------------------------- Cervix Uterus Adnexa  Cervix  Not visualized (advanced GA >24wks)  Uterus  No abnormality visualized.  Right Ovary  Within normal limits.  Left Ovary  Not visualized. No adnexal mass visualized.  Adnexa  No abnormality visualized ---------------------------------------------------------------------- Comments  Hospital Ultrasound  33w 4d who presents to the MAU for vaginal spotting. EDD:  01/30/2023 by LMP  (04/25/22).  Sonographic findings  Single intrauterine pregnancy.  Fetal cardiac activity: Observed and appears normal.  Presentation: Cephalic.  Limited fetal anatomy appears normal.  Amniotic fluid volume: Within normal limits. AFI: 16.5 cm.  MVP: 5.5 cm.  Placenta: Right lateral. There is no sonographic evidence of  bleeding.  Appropriate movement and tone for gestational age.  Recommendations  - While there is no sonogrpahic evidence of  placental  bleeding, placental abruption is a clinical diagnosis and  ultrasound findings of placental beeding are seen in less than  25% of cases  - The patient was admitted for observation. Continue clinical  management per OB provider  This was a limited ultrasound with a remote read. If an official  MFM consult is requested for any reason please call/place an  order in Epic. ----------------------------------------------------------------------                  Valeda Malm, DO Electronically Signed Final Report   12/16/2022 11:31 am ----------------------------------------------------------------------   Current scheduled medications  amLODipine  5 mg Oral Daily   betamethasone acetate-betamethasone sodium phosphate  12 mg Intramuscular Q24H   docusate sodium  100 mg Oral Daily   labetalol  100 mg Oral BID   prenatal multivitamin  1 tablet Oral Q1200    I have reviewed the patient's current medications.  ASSESSMENT: Principal Problem:   Antepartum placental abruption   PLAN: Vaginal bleeding, improving - S/p BMZ#1 1/30, 2nd dose today - Hgb 9.3, IV iron x1 ordered - FHT reactive & reassuring  2. cHTN - Normotensive - Continue labetalol 100mg  BID & amolodipine 5mg  daily  Continue routine antenatal care.  Gale Journey, MD [place], [facility] for [employer], [place]

## 2022-12-18 DIAGNOSIS — O4590 Premature separation of placenta, unspecified, unspecified trimester: Secondary | ICD-10-CM

## 2022-12-18 MED ORDER — DIPHENHYDRAMINE HCL 25 MG PO CAPS
25.0000 mg | ORAL_CAPSULE | Freq: Four times a day (QID) | ORAL | Status: DC | PRN
  Administered 2022-12-18: 25 mg via ORAL
  Filled 2022-12-18: qty 1

## 2022-12-18 MED ORDER — ONDANSETRON 4 MG PO TBDP
4.0000 mg | ORAL_TABLET | Freq: Three times a day (TID) | ORAL | Status: DC | PRN
  Administered 2022-12-18: 4 mg via ORAL
  Filled 2022-12-18: qty 1

## 2022-12-18 NOTE — Progress Notes (Signed)
[facility] COMPREHENSIVE PROGRESS NOTE  DESIRE FULP is a 37 y.o. [id number] at [redacted]w[redacted]d  who is admitted for vaginal bleeding in the third trimester.   Fetal presentation is cephalic. Length of Stay:  2  Days  Subjective: Pt reports no bleeding today. Spotting yesterday only when up to rest room. Patient reports good fetal movement.  She reports no uterine contractions and no loss of fluid per vagina.  Vitals:  Blood pressure 136/73, pulse (!) 103, temperature 97.8 F (36.6 C), temperature source Oral, resp. rate 18, height 5\' 3"  (1.6 m), weight 99.8 kg, last menstrual period 04/25/2022, SpO2 98 %, unknown if currently breastfeeding.  Physical Examination: Lungs clear Heart RRR Abd soft + BS gravid non tdner GU deferred Ext non tender  Fetal Monitoring:  Baseline: 130's bpm, Variability: Good {> 6 bpm), Accelerations: Reactive, and Decelerations: Absent  Labs:  No results found for this or any previous visit (from the past 24 hour(s)).  Imaging Studies:    NA   Medications:  Scheduled  amLODipine  5 mg Oral Daily   docusate sodium  100 mg Oral Daily   labetalol  100 mg Oral BID   prenatal multivitamin  1 tablet Oral Q1200   I have reviewed the patient's current medications.  ASSESSMENT: IUP 33 6/7 weeks Vaginal bleeding in third trimester CHTN   PLAN: Stable. Fetal well being reassuring. S/P iron infusion. BP stable on current meds. Continue routine antenatal care. If remains stable with not further vaginal bleeding, hopeful discharge on Sunday   Chancy Milroy 12/18/2022,4:08 PM

## 2022-12-19 ENCOUNTER — Ambulatory Visit: Payer: Medicaid Other

## 2022-12-19 DIAGNOSIS — Z3A34 34 weeks gestation of pregnancy: Secondary | ICD-10-CM

## 2022-12-19 MED ORDER — LIDOCAINE HCL (PF) 1 % IJ SOLN: 20.0000 mL | Freq: Once | INTRAMUSCULAR | Status: AC

## 2022-12-19 MED ORDER — LIDOCAINE HCL 1 % IJ SOLN
INTRAMUSCULAR | Status: AC
  Administered 2022-12-19: 2 mL
  Filled 2022-12-19: qty 20

## 2022-12-19 MED ORDER — CEPHALEXIN 500 MG PO CAPS
500.0000 mg | ORAL_CAPSULE | Freq: Three times a day (TID) | ORAL | Status: DC
  Administered 2022-12-19 – 2022-12-21 (×6): 500 mg via ORAL
  Filled 2022-12-19 (×6): qty 1

## 2022-12-19 NOTE — Progress Notes (Signed)
[facility] COMPREHENSIVE PROGRESS NOTE  Shirley Terrell is a 37 y.o. [id number] at [redacted]w[redacted]d  who is admitted for vaginal bleeding in the third trimester.   Fetal presentation is cephalic. Length of Stay:  3  Days  Subjective: Continues with no bleeding She does c/o a sore in her mons pubis Patient reports good fetal movement.  She reports no uterine contractions, no bleeding and no loss of fluid per vagina.  Vitals:  Blood pressure 137/78, pulse (!) 110, temperature 98.4 F (36.9 C), temperature source Oral, resp. rate 17, height 5\' 3"  (1.6 m), weight 99.8 kg, last menstrual period 04/25/2022, SpO2 98 %, unknown if currently breastfeeding.  Physical Examination: Lungs clear Heart RRR Abd soft + BS gravid GU heridantitis noted, area, prepped and anesthized with lidocaine, needle aspiration of purulent material,  Pt tolerated well  Fetal Monitoring:  Baseline: 130's bpm, Variability: Good {> 6 bpm), Accelerations: Reactive, and Decelerations: Absent  Labs:  No results found for this or any previous visit (from the past 24 hour(s)).  Imaging Studies:    NA   Medications:  Scheduled  amLODipine  5 mg Oral Daily   cephALEXin  500 mg Oral Q8H   docusate sodium  100 mg Oral Daily   labetalol  100 mg Oral BID   prenatal multivitamin  1 tablet Oral Q1200   I have reviewed the patient's current medications.  ASSESSMENT: IUP 34 0/7 weeks Vaginal bleeding in third trimester [facility] heridanetitis PLAN: Stable. Start Keflex heridantitis. BP stable with current regiment Fetal well being reassuring. If remains stable plan discharge home on Sunday Continue routine antenatal care.   Chancy Milroy 12/19/2022,12:28 PM

## 2022-12-19 NOTE — Plan of Care (Signed)
  Problem: Education: Goal: Knowledge of General Education information will improve Description: Including pain rating scale, medication(s)/side effects and non-pharmacologic comfort measures Outcome: Progressing   Problem: Health Behavior/Discharge Planning: Goal: Ability to manage health-related needs will improve Outcome: Progressing   Problem: Clinical Measurements: Goal: Ability to maintain clinical measurements within normal limits will improve Outcome: Progressing Goal: Will remain free from infection Outcome: Progressing Goal: Diagnostic test results will improve Outcome: Progressing Goal: Respiratory complications will improve Outcome: Progressing Goal: Cardiovascular complication will be avoided Outcome: Progressing   Problem: Activity: Goal: Risk for activity intolerance will decrease Outcome: Progressing   Problem: Nutrition: Goal: Adequate nutrition will be maintained Outcome: Progressing   Problem: Coping: Goal: Level of anxiety will decrease Outcome: Progressing   Problem: Elimination: Goal: Will not experience complications related to bowel motility Outcome: Progressing Goal: Will not experience complications related to urinary retention Outcome: Progressing   Problem: Pain Managment: Goal: General experience of comfort will improve Outcome: Progressing   Problem: Safety: Goal: Ability to remain free from injury will improve Outcome: Progressing   Problem: Skin Integrity: Goal: Risk for impaired skin integrity will decrease Outcome: Progressing   Problem: Education: Goal: Knowledge of General Education information will improve Description: Including pain rating scale, medication(s)/side effects and non-pharmacologic comfort measures Outcome: Progressing   Problem: Health Behavior/Discharge Planning: Goal: Ability to manage health-related needs will improve Outcome: Progressing   Problem: Clinical Measurements: Goal: Ability to maintain  clinical measurements within normal limits will improve Outcome: Progressing Goal: Will remain free from infection Outcome: Progressing Goal: Diagnostic test results will improve Outcome: Progressing Goal: Respiratory complications will improve Outcome: Progressing Goal: Cardiovascular complication will be avoided Outcome: Progressing   Problem: Activity: Goal: Risk for activity intolerance will decrease Outcome: Progressing   Problem: Nutrition: Goal: Adequate nutrition will be maintained Outcome: Progressing   Problem: Coping: Goal: Level of anxiety will decrease Outcome: Progressing   Problem: Elimination: Goal: Will not experience complications related to bowel motility Outcome: Progressing Goal: Will not experience complications related to urinary retention Outcome: Progressing   Problem: Pain Managment: Goal: General experience of comfort will improve Outcome: Progressing   Problem: Safety: Goal: Ability to remain free from injury will improve Outcome: Progressing   Problem: Skin Integrity: Goal: Risk for impaired skin integrity will decrease Outcome: Progressing   Problem: Education: Goal: Knowledge of disease or condition will improve Outcome: Progressing Goal: Knowledge of the prescribed therapeutic regimen will improve Outcome: Progressing Goal: Individualized Educational Video(s) Outcome: Progressing   Problem: Clinical Measurements: Goal: Complications related to the disease process, condition or treatment will be avoided or minimized Outcome: Progressing

## 2022-12-20 DIAGNOSIS — O4593 Premature separation of placenta, unspecified, third trimester: Principal | ICD-10-CM

## 2022-12-20 DIAGNOSIS — Z3A34 34 weeks gestation of pregnancy: Secondary | ICD-10-CM

## 2022-12-20 LAB — TYPE AND SCREEN
ABO/RH(D): O POS
Antibody Screen: NEGATIVE

## 2022-12-20 NOTE — Progress Notes (Unsigned)
Cardio-Obstetrics Clinic  New Evaluation  Date:  10/24/2022   ID:  Shirley Terrell, DOB Apr 29, 1986, MRN [id number]  PCP:  Aletha Halim, MD   [facility] Providers Cardiologist:  Candee Furbish, MD  Electrophysiologist:  None     Referring MD: Gildardo Pounds, NP   Chief Complaint: HTN, high risk pregnancy  History of Present Illness:    Shirley Terrell is a 37 y.o. female [G4P2012] who is being seen today for the evaluation of HTN, OSA and high risk pregnancy at the request of Gildardo Pounds, NP.   Was last seen in clinic on 07/11/22 for hypertension management. We continued her amlodipine 70m daily and changed her coreg to labetalol 1017mBID.  Today, the patient is currently 2662w0degnant. She is feeling well overall. BP is running mainly 110s/60-70s  at home. No orthostatic symptoms. Has mild LE edema. No chest pain, SOB, dizziness, lightheadedness, palpitations. Tolerating medications as prescribed.   Prior pregnancies complicated by HTN but no pre-eclampsia, gestational HTN, or IUGR.   Prior CV Studies Reviewed: The following studies were reviewed today: No CV studies  Past Medical History:  Diagnosis Date   Anemia    takes iron supplement   Asthma    prn inhaler   Hypertension    states BP normalized while pregnant and since delivery; is currently not on med.   Lisfranc dislocation 11/30/2016   left   Pregnancy induced hypertension    with 2nd preg   Sleep apnea    uses cpap    Past Surgical History:  Procedure Laterality Date   CHOLECYSTECTOMY  04/13/2012   Procedure: LAPAROSCOPIC CHOLECYSTECTOMY WITH INTRAOPERATIVE CHOLANGIOGRAM;  Surgeon: TodOdis HollingsheadD;  [facility];  Laterality: N/A;  laparoscopic cholecystectomy with IOC   INCISIONAL HERNIA REPAIR  08/13/2018   LAPROSCOPIC   INCISIONAL HERNIA REPAIR N/A 08/13/2018   Procedure: LAPAROSCOPIC INCISIONAL HERNIA REPAIR;  Surgeon: ConClovis RileyD;  [facility] [place];  Laterality: N/A;   INSERTION OF MESH N/A 08/13/2018   Procedure: INSERTION OF MESH;  Surgeon: ConClovis RileyD;  [facility];  Laterality: N/A;   OPEN REDUCTION INTERNAL FIXATION (ORIF) FOOT LISFRANC FRACTURE Left 12/18/2016   Procedure: OPEN REDUCTION INTERNAL FIXATION (ORIF) FOOT LISFRANC FRACTURE;  Surgeon: JohWylene SimmerD;  [facility];  Laterality: Left;      OB History     Gravida  4   Para  2   Term  2   Preterm      AB  1   Living  2      SAB  1   IAB      Ectopic      Multiple  0   Live Births  2               Current Medications: Current Meds  Medication Sig   albuterol (VENTOLIN HFA) 108 (90 Base) MCG/ACT inhaler Inhale 2 puffs into the lungs every 6 (six) hours as needed for wheezing.   amLODipine (NORVASC) 10 MG tablet Take 1 tablet (10 mg total) by mouth daily.   aspirin EC 81 MG tablet Take 1 tablet (81 mg total) by mouth daily.   Blood Pressure Monitor DEVI Please provide patient with insurance approved blood pressure monitor   Blood Pressure Monitoring (BLOOD PRESSURE KIT) DEVI 1 Device by Does not apply route as needed.  labetalol (NORMODYNE) 100 MG tablet Take 1 tablet (100 mg total) by mouth 2 (two) times daily.   Prenatal Vit-Fe Fumarate-FA (PRENATAL VITAMINS PO) Take 1 tablet by mouth daily.     Allergies:   Patient has no known allergies.   Social History   Socioeconomic History   Marital status: Single    Spouse name: Not on file   Number of children: Not on file   Years of education: Not on file   Highest education level: Not on file  Occupational History   Not on file  Tobacco Use   Smoking status: Former    Packs/day: 0.00    Years: 3.00    Total pack years: 0.00    Types: Cigarettes    Quit date: 12/14/2017    Years since quitting: 4.8   Smokeless tobacco: Never   Tobacco comments:    6 cigarettes/day  Vaping Use    Vaping Use: Never used  Substance and Sexual Activity   Alcohol use: Not Currently    Comment: occasionally   Drug use: No   Sexual activity: Yes    Partners: Male    Birth control/protection: None  Other Topics Concern   Not on file  Social History Narrative   She is single with 2 daughters, age 29 yrs and 1 yr in 2019.    Social Determinants of Health   Financial Resource Strain: Not on file  Food Insecurity: Not on file  Transportation Needs: Not on file  Physical Activity: Not on file  Stress: Not on file  Social Connections: Not on file      Family History  Problem Relation Age of Onset   Hypertension Mother    Stroke Mother    Renal Disease Mother        due to blood pressure   Heart disease Father        MI, no stents   Depression Father    Hypertension Father    Stroke Father    Healthy Sister    Healthy Sister    Healthy Sister    Healthy Sister    Asthma Daughter    Hypertension Maternal Grandmother    Asthma Paternal Grandmother    Cancer Paternal Grandmother        breast   Diabetes Paternal Grandmother    Cancer Paternal Grandfather    Diabetes Other       ROS:   Please see the history of present illness.    All other systems reviewed and are negative.   Labs/EKG Reviewed:    EKG:   EKG isordered today.  The ekg ordered today demonstrates NSR with HR 92  Recent Labs: 07/28/2022: ALT 42; BUN 4; Creatinine, Ser 0.53; Hemoglobin 11.1; Platelets 233; Potassium 4.0; Sodium 141; TSH 2.030   Recent Lipid Panel Lab Results  Component Value Date/Time   CHOL 154 12/25/2021 04:48 PM   TRIG 47 12/25/2021 04:48 PM   HDL 53 12/25/2021 04:48 PM   CHOLHDL 2.9 12/25/2021 04:48 PM   LDLCALC 91 12/25/2021 04:48 PM    Physical Exam:    VS:  BP 119/68   Pulse 90   Ht _0  (1.6 m)   Wt 220 lb (99.8 kg)   LMP 04/25/2022   SpO2 100%   BMI 38.97 kg/m     Wt Readings from Last 3 Encounters:  10/24/22 220 lb (99.8 kg)  10/13/22 222 lb 1.6 oz  (100.7 kg)  09/15/22 213 lb (96.6 kg)  GEN:  Well nourished, well developed in no acute distress HEENT: Normal NECK: No JVD; No carotid bruits CARDIAC: RRR, 1/6 systolic murmur RESPIRATORY:  Clear to auscultation without rales, wheezing or rhonchi  ABDOMEN: Gravid MUSCULOSKELETAL:  No edema; No deformity  SKIN: Warm and dry NEUROLOGIC:  Alert and oriented x 3 PSYCHIATRIC:  Normal affect    Risk Assessment/Risk Calculators:     ASSESSMENT & PLAN:    #Chronic HTN: #High Risk Pregnancy: Patient with chronic hypertension that is now complicating current pregnancy. Currently, she is doing well with BP well controlled. Denies orthostatic symptoms. Discussed if her SBP<100, we can always back down on her antihypertensives but she would like to continues as is for now as she has no orthostatic symptoms.  -Continue amlodipine 49m daily -Continue labetalol 1023mBID -Continue ASA 8180maily -Will monitor throughout pregnancy  #OSA: -Plan to fax in her letter to get her CPAP  Patient Instructions  Medication Instructions:   Your physician recommends that you continue on your current medications as directed. Please refer to the Current Medication list given to you today.  *If you need a refill on your cardiac medications before your next appointment, please call your pharmacy*    [place]  [place]         Dispo:  No follow-ups on file.   Medication Adjustments/Labs and Tests Ordered: Current medicines are reviewed at length with the patient today.  Concerns regarding medicines are outlined above.  Tests Ordered: Orders Placed This Encounter  Procedures   EKG 12-Lead   Medication Changes: No orders of the defined types were placed in this encounter.

## 2022-12-20 NOTE — Progress Notes (Signed)
Patient ID: Shirley Terrell, female   DOB: 09-11-1986, 37 y.o.   MRN: [id number] [place]) NOTE  Shirley Terrell is a 37 y.o. [id number] at [redacted]w[redacted]d by best clinical estimate who is admitted for 3rd trimester bleeding.   Fetal presentation is cephalic. Length of Stay:  4  Days  ASSESSMENT: Principal Problem:   Antepartum placental abruption CHTN on meds  PLAN: Continue inpt. Monitoring until 5-7 post bleeding. S/p BMZ Delivery with any worsening fetal indications. On Labetalol and Norvasc with good control   Subjective: No further bleeding Patient reports the fetal movement as active. Patient reports uterine contraction  activity as none. Patient reports  vaginal bleeding as none. Patient describes fluid per vagina as None.  Vitals:  Blood pressure 122/72, pulse 96, temperature 97.6 F (36.4 C), temperature source Oral, resp. rate 18, height 5\' 3"  (1.6 m), weight 99.8 kg, last menstrual period 04/25/2022, SpO2 99 %, unknown if currently breastfeeding. Physical Examination:  General appearance - alert, well appearing, and in no distress Chest - normal effort Abdomen - gravid, non-tender Fundal Height:  size equals dates Extremities: Homans sign is negative, no sign of DVT  Membranes:intact  Fetal Monitoring:  Baseline: 135 bpm, Variability: Good {> 6 bpm), Accelerations: Reactive, and Decelerations: Absent  Labs:  Results for orders placed or performed during the hospital encounter of 12/16/22 (from the past 24 hour(s))  Type and screen [place]   Collection Time: 12/20/22  2:39 AM  Result Value Ref Range   ABO/RH(D) O POS    Antibody Screen NEG    Sample Expiration      12/23/2022,2359 Performed at [facility], [place] [address] [place], [place] [address]     Imaging Studies:      Medications:  Scheduled  amLODipine  5 mg Oral Daily   cephALEXin  500 mg Oral Q8H   docusate sodium  100 mg Oral Daily    labetalol  100 mg Oral BID   prenatal multivitamin  1 tablet Oral Q1200   I have reviewed the patient's current medications.   Donnamae Jude, MD 12/20/2022,8:03 AM

## 2022-12-21 MED ORDER — CEPHALEXIN 500 MG PO CAPS: 500.0000 mg | ORAL_CAPSULE | Freq: Three times a day (TID) | ORAL | 0 refills | Status: DC

## 2022-12-21 NOTE — Discharge Summary (Signed)
Patient ID: Shirley Terrell MRN: [id number] DOB/AGE: 1986-11-17 37 y.o.  Admit date: 12/16/2022 Discharge date: 12/21/2022  Admission Diagnoses: IUP 34 weeks, vaginal bleeding  Discharge Diagnoses: SAA, undelivered  Prenatal Procedures: NST and ultrasound  Consults: NA  Hospital Course:  This is a 37 y.o. [id number] with IUP at [redacted]w[redacted]d admitted for above Dx. See H & P for additional information. She was observed for 5 days without further bleeding. She received BMZ x 2 for fetal lung maturity. She also had a flare up of her hereditists and underwent needle aspiration, placed on antibiotics.    She was observed, fetal heart rate monitoring remained reassuring, and she had no signs/symptoms of progressing preterm labor or other maternal-fetal concerns.  Her cervical exam was unchanged from admission.  She was deemed stable for discharge to home with outpatient follow up.  Discharge Exam: Temp:  [98 F (36.7 C)-98.6 F (37 C)] 98.6 F (37 C) (02/04 0825) Pulse Rate:  [94-103] 97 (02/04 0825) Resp:  [16-20] 18 (02/04 0825) BP: (115-129)/(63-77) 128/77 (02/04 0825) SpO2:  [95 %-100 %] 95 % (02/04 0825) Physical Examination: CONSTITUTIONAL: Well-developed, well-nourished female in no acute distress.  HENT:  Normocephalic, atraumatic, External right and left ear normal. Oropharynx is clear and moist EYES: Conjunctivae and EOM are normal. Pupils are equal, round, and reactive to light. No scleral icterus.  NECK: Normal range of motion, supple, no masses SKIN: Skin is warm and dry. No rash noted. Not diaphoretic. No erythema. No pallor. [place]: Alert and oriented to person, place, and time. Normal reflexes, muscle tone coordination. No cranial nerve deficit noted. PSYCHIATRIC: Normal mood and affect. Normal behavior. Normal judgment and thought content. CARDIOVASCULAR: Normal heart rate noted, regular rhythm RESPIRATORY: Effort and breath sounds normal, no problems with respiration  noted MUSCULOSKELETAL: Normal range of motion. No edema and no tenderness. 2+ distal pulses. ABDOMEN: Soft, nontender, nondistended, gravid. CERVIX:  deferred  Fetal monitoring: FHR: 130 bpm, Variability: moderate, Accelerations: Present, Decelerations: Absent  Uterine activity: no contractions per hour  Significant Diagnostic Studies:  Results for orders placed or performed during the hospital encounter of 12/16/22 (from the past 168 hour(s))  RPR   Collection Time: 12/16/22  1:59 PM  Result Value Ref Range   RPR Ser Ql NON REACTIVE NON REACTIVE  CBC   Collection Time: 12/16/22  1:59 PM  Result Value Ref Range   WBC 12.5 (H) 4.0 - 10.5 K/uL   RBC 2.87 (L) 3.87 - 5.11 MIL/uL   Hemoglobin 9.3 (L) 12.0 - 15.0 g/dL   HCT 27.1 (L) 36.0 - 46.0 %   MCV 94.4 80.0 - 100.0 fL   MCH 32.4 26.0 - 34.0 pg   MCHC 34.3 30.0 - 36.0 g/dL   RDW 12.6 11.5 - 15.5 %   Platelets 209 150 - 400 K/uL   nRBC 0.0 0.0 - 0.2 %  Type and screen [place]   Collection Time: 12/20/22  2:39 AM  Result Value Ref Range   ABO/RH(D) O POS    Antibody Screen NEG    Sample Expiration      12/23/2022,2359 Performed at [facility], [place] [address] [place], [place] [address]   Results for orders placed or performed during the hospital encounter of 12/16/22 (from the past 168 hour(s))  Urinalysis, Routine w reflex microscopic -Urine, Clean Catch   Collection Time: 12/16/22  9:37 AM  Result Value Ref Range   Color, Urine STRAW (A) YELLOW   APPearance CLEAR  CLEAR   Specific Gravity, Urine 1.004 (L) 1.005 - 1.030   pH 8.0 5.0 - 8.0   Glucose, UA NEGATIVE NEGATIVE mg/dL   Hgb urine dipstick MODERATE (A) NEGATIVE   Bilirubin Urine NEGATIVE NEGATIVE   Ketones, ur 5 (A) NEGATIVE mg/dL   Protein, ur NEGATIVE NEGATIVE mg/dL   Nitrite NEGATIVE NEGATIVE   Leukocytes,Ua TRACE (A) NEGATIVE   RBC / HPF 0-5 0 - 5 RBC/hpf   WBC, UA 0-5 0 - 5 WBC/hpf   Bacteria, UA RARE (A) NONE SEEN   Squamous  Epithelial / HPF 0-5 0 - 5 /HPF  Amnisure rupture of membrane (rom)not at [facility]   Collection Time: 12/16/22  9:37 AM  Result Value Ref Range   Amnisure ROM NEGATIVE   Type and screen [place]   Collection Time: 12/16/22  1:59 PM  Result Value Ref Range   ABO/RH(D) O POS    Antibody Screen NEG    Sample Expiration      12/19/2022,2359 Performed at [facility], [place] [address], [place], [place] [address]     Discharge Condition: Stable  Disposition: Discharge disposition: 01-Home or Self Care        Discharge Instructions     Discharge activity:  No Restrictions   Complete by: As directed    Do not have sex or do anything that might make you have an orgasm   Complete by: As directed    Fetal Kick Count:  Lie on our left side for one hour after a meal, and count the number of times your baby kicks.  If it is less than 5 times, get up, move around and drink some juice.  Repeat the test 30 minutes later.  If it is still less than 5 kicks in an hour, notify your doctor.   Complete by: As directed    Notify physician for a general feeling that "something is not right"   Complete by: As directed    Notify physician for increase or change in vaginal discharge   Complete by: As directed    Notify physician for intestinal cramps, with or without diarrhea, sometimes described as "gas pain"   Complete by: As directed    Notify physician for leaking of fluid   Complete by: As directed    Notify physician for low, dull backache, unrelieved by heat or Tylenol   Complete by: As directed    Notify physician for menstrual like cramps   Complete by: As directed    Notify physician for pelvic pressure   Complete by: As directed    Notify physician for uterine contractions.  These may be painless and feel like the uterus is tightening or the baby is  "balling up"   Complete by: As directed    Notify physician for vaginal bleeding   Complete by: As directed     PRETERM LABOR:  Includes any of the follwing symptoms that occur between 20 - [redacted] weeks gestation.  If these symptoms are not stopped, preterm labor can result in preterm delivery, placing your baby at risk   Complete by: As directed       Allergies as of 12/21/2022   No Known Allergies      Medication List     TAKE these medications    albuterol 108 (90 Base) MCG/ACT inhaler Commonly known as: VENTOLIN HFA Inhale 2 puffs into the lungs every 6 (six) hours as needed for wheezing.   amLODipine 5 MG  tablet Commonly known as: NORVASC Take 1 tablet (5 mg total) by mouth daily.   aspirin EC 81 MG tablet Take 1 tablet (81 mg total) by mouth daily.   Blood Pressure Monitor Devi Please provide patient with insurance approved blood pressure monitor   Blood Pressure Kit Devi 1 Device by Does not apply route as needed.   calcium carbonate 500 MG chewable tablet Commonly known as: TUMS - dosed in mg elemental calcium Chew 1 tablet by mouth daily.   cephALEXin 500 MG capsule Commonly known as: Keflex Take 1 capsule (500 mg total) by mouth 3 (three) times daily.   famotidine 20 MG tablet Commonly known as: PEPCID Take 1 tablet (20 mg total) by mouth 2 (two) times daily.   labetalol 100 MG tablet Commonly known as: NORMODYNE Take 1 tablet (100 mg total) by mouth 2 (two) times daily.   PRENATAL VITAMINS PO Take 1 tablet by mouth daily.        Follow-up [place] for [facility] at [facility] Follow up.   Specialty: Obstetrics and Gynecology Why: Pt has appt this week Contact information: [address] [place] [address] [phone]                Signed: Chancy Milroy M.D. 12/21/2022, 11:31 AM

## 2022-12-22 ENCOUNTER — Ambulatory Visit: Payer: Medicaid Other | Attending: Cardiology | Admitting: Cardiology

## 2022-12-22 ENCOUNTER — Encounter: Payer: Self-pay | Admitting: Cardiology

## 2022-12-22 VITALS — BP 124/76 | HR 108 | Ht 63.0 in | Wt 219.8 lb

## 2022-12-22 DIAGNOSIS — O10919 Unspecified pre-existing hypertension complicating pregnancy, unspecified trimester: Secondary | ICD-10-CM

## 2022-12-22 DIAGNOSIS — Z3A34 34 weeks gestation of pregnancy: Secondary | ICD-10-CM

## 2022-12-22 DIAGNOSIS — O0990 Supervision of high risk pregnancy, unspecified, unspecified trimester: Secondary | ICD-10-CM

## 2022-12-22 DIAGNOSIS — G4733 Obstructive sleep apnea (adult) (pediatric): Secondary | ICD-10-CM

## 2022-12-22 NOTE — Patient Instructions (Signed)
Medication Instructions:   Your physician recommends that you continue on your current medications as directed. Please refer to the Current Medication list given to you today.  *If you need a refill on your cardiac medications before your next appointment, please call your pharmacy*    Follow-Up:  4 MONTHS WITH DR. Johney Frame EITHER HERE AT [place]

## 2022-12-26 ENCOUNTER — Ambulatory Visit: Payer: Medicaid Other | Attending: Maternal & Fetal Medicine

## 2022-12-26 ENCOUNTER — Ambulatory Visit (INDEPENDENT_AMBULATORY_CARE_PROVIDER_SITE_OTHER): Payer: Medicaid Other | Admitting: Obstetrics and Gynecology

## 2022-12-26 ENCOUNTER — Other Ambulatory Visit: Payer: Self-pay

## 2022-12-26 ENCOUNTER — Ambulatory Visit: Payer: Medicaid Other

## 2022-12-26 VITALS — BP 116/72 | HR 99 | Wt 221.2 lb

## 2022-12-26 VITALS — BP 115/70 | HR 95

## 2022-12-26 DIAGNOSIS — O0990 Supervision of high risk pregnancy, unspecified, unspecified trimester: Secondary | ICD-10-CM | POA: Diagnosis present

## 2022-12-26 DIAGNOSIS — O0993 Supervision of high risk pregnancy, unspecified, third trimester: Secondary | ICD-10-CM

## 2022-12-26 DIAGNOSIS — Z3A35 35 weeks gestation of pregnancy: Secondary | ICD-10-CM | POA: Diagnosis not present

## 2022-12-26 DIAGNOSIS — O99213 Obesity complicating pregnancy, third trimester: Secondary | ICD-10-CM

## 2022-12-26 DIAGNOSIS — O10013 Pre-existing essential hypertension complicating pregnancy, third trimester: Secondary | ICD-10-CM | POA: Diagnosis not present

## 2022-12-26 DIAGNOSIS — O10919 Unspecified pre-existing hypertension complicating pregnancy, unspecified trimester: Secondary | ICD-10-CM

## 2022-12-26 DIAGNOSIS — E669 Obesity, unspecified: Secondary | ICD-10-CM | POA: Diagnosis not present

## 2022-12-26 DIAGNOSIS — O99212 Obesity complicating pregnancy, second trimester: Secondary | ICD-10-CM

## 2022-12-26 DIAGNOSIS — O10913 Unspecified pre-existing hypertension complicating pregnancy, third trimester: Secondary | ICD-10-CM

## 2022-12-26 DIAGNOSIS — K439 Ventral hernia without obstruction or gangrene: Secondary | ICD-10-CM

## 2022-12-26 NOTE — Progress Notes (Signed)
   PRENATAL VISIT NOTE  Subjective:  Shirley Terrell is a 37 y.o. [phone] at [redacted]w[redacted]d being seen today for ongoing prenatal care.  She is currently monitored for the following issues for this high-risk pregnancy and has Chronic hypertension during pregnancy, antepartum; Essential hypertension; Obesity affecting pregnancy; Obesity, Class III, BMI 40-49.9 (morbid obesity) ([place]); OSA (obstructive sleep apnea); Tobacco abuse; S/p umbilical hernia mesh repair; Supervision of high risk pregnancy, antepartum; Cervical dysplasia; and Antepartum placental abruption on their problem list.  Patient doing well with no acute concerns today. She reports no complaints.  Contractions: Irritability. Vag. Bleeding: None.  Movement: Present. Denies leaking of fluid.   The following portions of the patient's history were reviewed and updated as appropriate: allergies, current medications, past family history, past medical history, past social history, past surgical history and problem list. Problem list updated.  Objective:   Vitals:   12/26/22 0824  BP: 116/72  Pulse: 99  Weight: 221 lb 3.2 oz (100.3 kg)    Fetal Status: Fetal Heart Rate (bpm): 139 Fundal Height: 35 cm Movement: Present     General:  Alert, oriented and cooperative. Patient is in no acute distress.  Skin: Skin is warm and dry. No rash noted.   Cardiovascular: Normal heart rate noted  Respiratory: Normal respiratory effort, no problems with respiration noted  Abdomen: Soft, gravid, appropriate for gestational age.  Pain/Pressure: Present     Pelvic: Cervical exam deferred        Extremities: Normal range of motion.  Edema: Trace  Mental Status:  Normal mood and affect. Normal behavior. Normal judgment and thought content.   Assessment and Plan:  Pregnancy: [id number] at [redacted]w[redacted]d  1. [redacted] weeks gestation of pregnancy   2. Chronic hypertension during pregnancy, antepartum Good control with current medications Pt has fetal testing today and  growth scan next week With current grwoth and BP control would estimate time of delivery at 39 weeks  3. S/p umbilical hernia mesh repair May be an issue in regards to [place], pt may need [place] entry  4. Supervision of high risk pregnancy, antepartum Continue routine prenatal care  5. Obesity affecting pregnancy in second trimester, unspecified obesity type   Preterm labor symptoms and general obstetric precautions including but not limited to vaginal bleeding, contractions, leaking of fluid and fetal movement were reviewed in detail with the patient.  Please refer to After Visit Summary for other counseling recommendations.   Return in about 1 week (around 01/02/2023) for [facility], in person, 36 weeks swabs.   Lynnda Shields, MD Faculty Attending Center for [facility]

## 2022-12-26 NOTE — Progress Notes (Signed)
Pt reports no more bleeding

## 2023-01-02 ENCOUNTER — Other Ambulatory Visit: Payer: Self-pay

## 2023-01-02 ENCOUNTER — Other Ambulatory Visit (HOSPITAL_COMMUNITY)
Admission: RE | Admit: 2023-01-02 | Discharge: 2023-01-02 | Disposition: A | Discharge status: Home / Self Care | Payer: Medicaid Other | Source: Ambulatory Visit | Attending: Obstetrics and Gynecology | Admitting: Obstetrics and Gynecology

## 2023-01-02 ENCOUNTER — Ambulatory Visit (INDEPENDENT_AMBULATORY_CARE_PROVIDER_SITE_OTHER): Payer: Medicaid Other | Admitting: Obstetrics & Gynecology

## 2023-01-02 ENCOUNTER — Other Ambulatory Visit: Payer: Self-pay | Admitting: Obstetrics & Gynecology

## 2023-01-02 ENCOUNTER — Ambulatory Visit: Payer: Medicaid Other | Attending: Maternal & Fetal Medicine

## 2023-01-02 ENCOUNTER — Ambulatory Visit: Payer: Medicaid Other | Admitting: *Deleted

## 2023-01-02 VITALS — BP 145/80 | HR 109

## 2023-01-02 VITALS — BP 116/66 | HR 120 | Wt 221.0 lb

## 2023-01-02 DIAGNOSIS — O10013 Pre-existing essential hypertension complicating pregnancy, third trimester: Secondary | ICD-10-CM | POA: Diagnosis not present

## 2023-01-02 DIAGNOSIS — O10919 Unspecified pre-existing hypertension complicating pregnancy, unspecified trimester: Secondary | ICD-10-CM

## 2023-01-02 DIAGNOSIS — O0990 Supervision of high risk pregnancy, unspecified, unspecified trimester: Secondary | ICD-10-CM | POA: Insufficient documentation

## 2023-01-02 DIAGNOSIS — O99213 Obesity complicating pregnancy, third trimester: Secondary | ICD-10-CM

## 2023-01-02 DIAGNOSIS — Z3A36 36 weeks gestation of pregnancy: Secondary | ICD-10-CM | POA: Diagnosis not present

## 2023-01-02 DIAGNOSIS — O10913 Unspecified pre-existing hypertension complicating pregnancy, third trimester: Secondary | ICD-10-CM | POA: Diagnosis present

## 2023-01-02 DIAGNOSIS — O0993 Supervision of high risk pregnancy, unspecified, third trimester: Secondary | ICD-10-CM

## 2023-01-02 DIAGNOSIS — E669 Obesity, unspecified: Secondary | ICD-10-CM | POA: Diagnosis not present

## 2023-01-02 NOTE — Progress Notes (Signed)
   PRENATAL VISIT NOTE  Subjective:  Shirley Terrell is a 37 y.o. [phone] at 67w0dbeing seen today for ongoing prenatal care.  She is currently monitored for the following issues for this high-risk pregnancy and has Chronic hypertension during pregnancy, antepartum; Essential hypertension; Obesity affecting pregnancy; Obesity, Class III, BMI 40-49.9 (morbid obesity) ([place]; OSA (obstructive sleep apnea); Tobacco abuse; S/p umbilical hernia mesh repair; Supervision of high risk pregnancy, antepartum; Cervical dysplasia; and Antepartum placental abruption on their problem list.  Patient reports headache.  Contractions: Irregular. Vag. Bleeding: None.  Movement: Present. Denies leaking of fluid.   The following portions of the patient's history were reviewed and updated as appropriate: allergies, current medications, past family history, past medical history, past social history, past surgical history and problem list.   Objective:   Vitals:   01/02/23 0937  BP: 116/66  Pulse: (!) 120  Weight: 100.2 kg    Fetal Status: Fetal Heart Rate (bpm): 146   Movement: Present     General:  Alert, oriented and cooperative. Patient is in no acute distress.  Skin: Skin is warm and dry. No rash noted.   Cardiovascular: Normal heart rate noted  Respiratory: Normal respiratory effort, no problems with respiration noted  Abdomen: Soft, gravid, appropriate for gestational age.  Pain/Pressure: Present     Pelvic: Cervical exam deferred        Extremities: Normal range of motion.  Edema: Trace  Mental Status: Normal mood and affect. Normal behavior. Normal judgment and thought content.   Assessment and Plan:  Pregnancy: [id number] 363w0d. Supervision of high risk pregnancy, antepartum 36 week labs - Culture, beta strep (group b only) - GC/Chlamydia probe amp ([place]. Chronic hypertension during pregnancy, antepartum Delivery rec 37-38 weeks, IOL scheduled  Preterm labor  symptoms and general obstetric precautions including but not limited to vaginal bleeding, contractions, leaking of fluid and fetal movement were reviewed in detail with the patient. Please refer to After Visit Summary for other counseling recommendations.   Return in about 1 week (around 01/09/2023).  Future Appointments  Date Time Provider [place]2/16/2024 11:15 AM WMC-MFC NURSE [facility]2/16/2024 11:30 AM WMC-MFC US3 [facility]MFCUS [facility]2/22/2024  1:15 PM PiAletha HalimMD [facility]2/23/2024  9:30 AM WMC-MFC NURSE [facility]MC-MFC WMA[facility]2/23/2024  9:45 AM WMC-MFC US7 WMC-MFCUS [facility]2/29/2024 10:15 AM PrDonnamae JudeMD [facility]3/04/2023 10:15 AM DuRadene GunningMD [facility]3/14/2024 11:15 AM PiAletha HalimMD [facility] Augustine[facility]3/21/2024  8:15 AM BaGriffin BasilMD WM[facility]3/21/2024  9:15 AM WMC-WOCA NST WMC-CWH WME[facility]6/14/2024  2:00 PM PeFreada BergeronMD CVD-WMC None    JaEmeterio ReeveMD

## 2023-01-02 NOTE — Progress Notes (Signed)
Patient reports increase in headaches at home as well as elevated BPs around 140s/90s

## 2023-01-05 LAB — GC/CHLAMYDIA PROBE AMP (~~LOC~~) NOT AT ARMC
Chlamydia: NEGATIVE
Comment: NEGATIVE
Comment: NORMAL
Neisseria Gonorrhea: NEGATIVE

## 2023-01-05 LAB — CULTURE, BETA STREP (GROUP B ONLY): Strep Gp B Culture: NEGATIVE

## 2023-01-08 ENCOUNTER — Ambulatory Visit: Payer: Medicaid Other | Admitting: Obstetrics and Gynecology

## 2023-01-08 ENCOUNTER — Other Ambulatory Visit: Payer: Self-pay

## 2023-01-08 VITALS — BP 133/89 | HR 114 | Wt 223.0 lb

## 2023-01-08 DIAGNOSIS — O0990 Supervision of high risk pregnancy, unspecified, unspecified trimester: Secondary | ICD-10-CM

## 2023-01-08 DIAGNOSIS — Z3A36 36 weeks gestation of pregnancy: Secondary | ICD-10-CM

## 2023-01-08 DIAGNOSIS — O0993 Supervision of high risk pregnancy, unspecified, third trimester: Secondary | ICD-10-CM

## 2023-01-08 NOTE — Progress Notes (Signed)
   PRENATAL VISIT NOTE  Subjective:  Shirley Terrell is a 37 y.o. [phone] at 54w6dbeing seen today for ongoing prenatal care.  She is currently monitored for the following issues for this high-risk pregnancy and has Chronic hypertension during pregnancy, antepartum; Obesity affecting pregnancy; OSA (obstructive sleep apnea); Tobacco abuse; S/p umbilical hernia mesh repair; Supervision of high risk pregnancy, antepartum; Cervical dysplasia; and Antepartum placental abruption on their problem list.  Patient reports no complaints.  Contractions: Irritability. Vag. Bleeding: None.  Movement: Present. Denies leaking of fluid.   The following portions of the patient's history were reviewed and updated as appropriate: allergies, current medications, past family history, past medical history, past social history, past surgical history and problem list.   Objective:   Vitals:   01/08/23 1320  BP: 133/89  Pulse: (!) 114  Weight: 223 lb (101.2 kg)    Fetal Status: Fetal Heart Rate (bpm): 143   Movement: Present     General:  Alert, oriented and cooperative. Patient is in no acute distress.  Skin: Skin is warm and dry. No rash noted.   Cardiovascular: Normal heart rate noted  Respiratory: Normal respiratory effort, no problems with respiration noted  Abdomen: Soft, gravid, appropriate for gestational age.  Pain/Pressure: Present     Pelvic: Cervical exam deferred        Extremities: Normal range of motion.  Edema: None  Mental Status: Normal mood and affect. Normal behavior. Normal judgment and thought content.   Assessment and Plan:  Pregnancy: [id number] [redacted]w[redacted]d. Supervision of high risk pregnancy, antepartum GBS neg  2. [redacted] weeks gestation of pregnancy BTL papers signed 12/27; patient aware only would be done during a c-section given h/o umbilical hernia repair with mesh. Other options d/w pt   3. Chronic hypertension during pregnancy, antepartum On amlodipine 5 qday and labetalol 100  bid. [facility] Pt set up for 2/29 day time IOL. Follow up weekly testing tomorrow 2/16: 76%, 3069g, ac 87%, afi 25, 8/8, ceph   4. Obstructive sleep apnea syndrome, mild D/w her importance of getting machine and cpap usage   5. S/p umbilical hernia mesh repair See above   6. Obesity, Class III, BMI 40-49.9 (morbid obesity) (HCC)   7. Obesity affecting pregnancy in first trimester  Preterm labor symptoms and general obstetric precautions including but not limited to vaginal bleeding, contractions, leaking of fluid and fetal movement were reviewed in detail with the patient. Please refer to After Visit Summary for other counseling recommendations.   No follow-ups on file.  Future Appointments  Date Time Provider [place]2/23/2024  9:30 AM WMDo[facility]burgURSE WMN[facility] CenterMC[facility]2/23/2024  9:45 AM WMC-MFC US7 WMC-MFCUS WMT[facility]2/29/2024 10:15 AM PrDonnamae JudeMD WMEastern [facility] CenterM[facility]3/04/2023 10:15 AM DuRadene GunningMD WM[facility] HospitalM[facility]3/14/2024 11:15 AM PiAletha HalimMD WMRec[facility]MS[facility]3/21/2024  8:15 AM BaGriffin BasilMD WMChan [facility]3/21/2024  9:15 AM WMC-WOCA NST WMSyos[facility] HospitalMPoud[facility]6/14/2024  2:00 PM PeJohney FrameHeGreer EeMD CVD-WMC None    ChAletha HalimMD

## 2023-01-09 ENCOUNTER — Ambulatory Visit: Payer: Medicaid Other

## 2023-01-09 ENCOUNTER — Ambulatory Visit: Payer: Medicaid Other | Attending: Maternal & Fetal Medicine

## 2023-01-12 ENCOUNTER — Encounter (HOSPITAL_COMMUNITY): Payer: Self-pay | Admitting: *Deleted

## 2023-01-12 ENCOUNTER — Telehealth (HOSPITAL_COMMUNITY): Payer: Self-pay | Admitting: *Deleted

## 2023-01-12 NOTE — Telephone Encounter (Signed)
Preadmission screen  

## 2023-01-12 NOTE — Progress Notes (Signed)
Alert received through NCNotify system as follows: Patient identified as not having a recommended prenatal visit. Patient is 37 weeks and 1 days pregnant. Most recent prenatal visit was on 12/26/2022 at [place].  Chart review: Patient's last visit was on 01/08/2023.  No action needed.  Martina Sinner, RN 01/12/2023  9:46 AM

## 2023-01-14 ENCOUNTER — Other Ambulatory Visit: Payer: Self-pay | Admitting: Advanced Practice Midwife

## 2023-01-15 ENCOUNTER — Encounter (HOSPITAL_COMMUNITY): Payer: Self-pay | Admitting: Obstetrics & Gynecology

## 2023-01-15 ENCOUNTER — Inpatient Hospital Stay (HOSPITAL_COMMUNITY)
Admission: AD | Admit: 2023-01-15 | Discharge: 2023-01-18 | DRG: 806 | Disposition: A | Discharge status: Home / Self Care | Payer: Medicaid Other | Attending: Obstetrics & Gynecology | Admitting: Obstetrics & Gynecology

## 2023-01-15 ENCOUNTER — Inpatient Hospital Stay (HOSPITAL_COMMUNITY): Payer: Medicaid Other

## 2023-01-15 ENCOUNTER — Encounter: Payer: Self-pay | Admitting: Family Medicine

## 2023-01-15 ENCOUNTER — Other Ambulatory Visit: Payer: Self-pay

## 2023-01-15 DIAGNOSIS — G4733 Obstructive sleep apnea (adult) (pediatric): Secondary | ICD-10-CM | POA: Diagnosis present

## 2023-01-15 DIAGNOSIS — O9952 Diseases of the respiratory system complicating childbirth: Secondary | ICD-10-CM | POA: Diagnosis present

## 2023-01-15 DIAGNOSIS — O10919 Unspecified pre-existing hypertension complicating pregnancy, unspecified trimester: Principal | ICD-10-CM | POA: Diagnosis present

## 2023-01-15 DIAGNOSIS — I10 Essential (primary) hypertension: Secondary | ICD-10-CM | POA: Diagnosis present

## 2023-01-15 DIAGNOSIS — O99210 Obesity complicating pregnancy, unspecified trimester: Secondary | ICD-10-CM | POA: Diagnosis present

## 2023-01-15 DIAGNOSIS — K439 Ventral hernia without obstruction or gangrene: Secondary | ICD-10-CM | POA: Diagnosis present

## 2023-01-15 DIAGNOSIS — O1002 Pre-existing essential hypertension complicating childbirth: Principal | ICD-10-CM | POA: Diagnosis present

## 2023-01-15 DIAGNOSIS — Z87891 Personal history of nicotine dependence: Secondary | ICD-10-CM | POA: Diagnosis not present

## 2023-01-15 DIAGNOSIS — J45909 Unspecified asthma, uncomplicated: Secondary | ICD-10-CM | POA: Diagnosis present

## 2023-01-15 DIAGNOSIS — Z3A38 38 weeks gestation of pregnancy: Secondary | ICD-10-CM | POA: Diagnosis not present

## 2023-01-15 DIAGNOSIS — Z87410 Personal history of cervical dysplasia: Secondary | ICD-10-CM

## 2023-01-15 DIAGNOSIS — O99214 Obesity complicating childbirth: Secondary | ICD-10-CM | POA: Diagnosis present

## 2023-01-15 DIAGNOSIS — O99334 Smoking (tobacco) complicating childbirth: Secondary | ICD-10-CM | POA: Diagnosis not present

## 2023-01-15 DIAGNOSIS — O99354 Diseases of the nervous system complicating childbirth: Secondary | ICD-10-CM | POA: Diagnosis present

## 2023-01-15 DIAGNOSIS — O0990 Supervision of high risk pregnancy, unspecified, unspecified trimester: Secondary | ICD-10-CM

## 2023-01-15 DIAGNOSIS — Z720 Tobacco use: Secondary | ICD-10-CM | POA: Diagnosis present

## 2023-01-15 LAB — RPR: RPR Ser Ql: NONREACTIVE

## 2023-01-15 LAB — CBC
HCT: 33 % — ABNORMAL LOW (ref 36.0–46.0)
Hemoglobin: 10.2 g/dL — ABNORMAL LOW (ref 12.0–15.0)
MCH: 31.4 pg (ref 26.0–34.0)
MCHC: 30.9 g/dL (ref 30.0–36.0)
MCV: 101.5 fL — ABNORMAL HIGH (ref 80.0–100.0)
Platelets: 179 10*3/uL (ref 150–400)
RBC: 3.25 MIL/uL — ABNORMAL LOW (ref 3.87–5.11)
RDW: 12.7 % (ref 11.5–15.5)
WBC: 10.7 10*3/uL — ABNORMAL HIGH (ref 4.0–10.5)
nRBC: 0 % (ref 0.0–0.2)

## 2023-01-15 LAB — TYPE AND SCREEN
ABO/RH(D): O POS
Antibody Screen: NEGATIVE

## 2023-01-15 MED ORDER — OXYCODONE-ACETAMINOPHEN 5-325 MG PO TABS: 1.0000 | ORAL_TABLET | ORAL | Status: DC | PRN

## 2023-01-15 MED ORDER — LACTATED RINGERS IV SOLN: 500.0000 mL | INTRAVENOUS | Status: DC | PRN

## 2023-01-15 MED ORDER — TERBUTALINE SULFATE 1 MG/ML IJ SOLN: 0.2500 mg | Freq: Once | INTRAMUSCULAR | Status: DC | PRN

## 2023-01-15 MED ORDER — LABETALOL HCL 100 MG PO TABS
100.0000 mg | ORAL_TABLET | Freq: Two times a day (BID) | ORAL | Status: DC
  Administered 2023-01-15 (×2): 100 mg via ORAL
  Filled 2023-01-15 (×2): qty 1

## 2023-01-15 MED ORDER — ACETAMINOPHEN 325 MG PO TABS: 650.0000 mg | ORAL_TABLET | ORAL | Status: DC | PRN

## 2023-01-15 MED ORDER — ONDANSETRON HCL 4 MG/2ML IJ SOLN: 4.0000 mg | Freq: Four times a day (QID) | INTRAMUSCULAR | Status: DC | PRN

## 2023-01-15 MED ORDER — OXYTOCIN BOLUS FROM INFUSION
333.0000 mL | Freq: Once | INTRAVENOUS | Status: AC
  Administered 2023-01-16: 333 mL via INTRAVENOUS

## 2023-01-15 MED ORDER — OXYTOCIN-SODIUM CHLORIDE 30-0.9 UT/500ML-% IV SOLN
2.5000 [IU]/h | INTRAVENOUS | Status: DC
  Administered 2023-01-16: 2.5 [IU]/h via INTRAVENOUS
  Filled 2023-01-15: qty 500

## 2023-01-15 MED ORDER — MISOPROSTOL 25 MCG QUARTER TABLET
25.0000 ug | ORAL_TABLET | Freq: Once | ORAL | Status: AC
  Administered 2023-01-15: 25 ug via VAGINAL
  Filled 2023-01-15: qty 1

## 2023-01-15 MED ORDER — AMLODIPINE BESYLATE 5 MG PO TABS
5.0000 mg | ORAL_TABLET | Freq: Every day | ORAL | Status: DC
  Administered 2023-01-15 – 2023-01-17 (×3): 5 mg via ORAL
  Filled 2023-01-15 (×3): qty 1

## 2023-01-15 MED ORDER — SOD CITRATE-CITRIC ACID 500-334 MG/5ML PO SOLN: 30.0000 mL | ORAL | Status: DC | PRN

## 2023-01-15 MED ORDER — MISOPROSTOL 50MCG HALF TABLET
50.0000 ug | ORAL_TABLET | Freq: Once | ORAL | Status: AC
  Administered 2023-01-15: 50 ug via ORAL
  Filled 2023-01-15: qty 1

## 2023-01-15 MED ORDER — OXYCODONE-ACETAMINOPHEN 5-325 MG PO TABS: 2.0000 | ORAL_TABLET | ORAL | Status: DC | PRN

## 2023-01-15 MED ORDER — LACTATED RINGERS IV SOLN: INTRAVENOUS | Status: DC

## 2023-01-15 MED ORDER — LIDOCAINE HCL (PF) 1 % IJ SOLN
30.0000 mL | INTRAMUSCULAR | Status: DC | PRN
  Filled 2023-01-15: qty 30

## 2023-01-15 MED ORDER — MISOPROSTOL 50MCG HALF TABLET
50.0000 ug | ORAL_TABLET | Freq: Once | ORAL | Status: AC
  Administered 2023-01-15: 50 ug via BUCCAL
  Filled 2023-01-15: qty 1

## 2023-01-15 MED ORDER — OXYTOCIN-SODIUM CHLORIDE 30-0.9 UT/500ML-% IV SOLN
1.0000 m[IU]/min | INTRAVENOUS | Status: DC
  Administered 2023-01-15: 6 m[IU]/min via INTRAVENOUS
  Administered 2023-01-15: 8 m[IU]/min via INTRAVENOUS
  Administered 2023-01-15: 2 m[IU]/min via INTRAVENOUS

## 2023-01-15 NOTE — Progress Notes (Signed)
Patient ID: ROMEKA BLACKWOOD, female   DOB: August 17, 1986, 37 y.o.   MRN: [id number]  Labor Progress Note MALAK BEANER is a 37 y.o. [id number] at 35w6dpresented for IOL for cHTN  S:  Pt comfortable, has been ambulating and resting.  O:  BP 131/78   Pulse 99   Temp 98.2 F (36.8 C) (Oral)   Resp 18   Ht '5\' 3"'$  (1.6 m)   Wt 224 lb (101.6 kg)   LMP 04/25/2022   BMI 39.68 kg/m  EFM: baseline 140 bpm/ moderate variability/ 15x15 accels/ no decels  Toco/IUPC: q3-3.532m SVE: Dilation: 2 Effacement (%): 50, 60 Cervical Position: Posterior Station: -3 Presentation: Vertex Exam by:: J Heike Pounds CNM Pitocin: None   A/P: [age].o. [id number]. Labor: Latent, progressed nicely with first dose of cytotec, will give another dose of 85m48muccal cytotec and encouraged ambulation. 2. FWB: Cat 1 3. Pain: Well tolerated currently 4. cHTN: BP now well controlled post daily HTN meds  Encouraged position changes and ambulation with cytotec, will assess for AROM at next cervical exam.  JamiGaylan GeroldM, MSN, IBCLEvermantified Nurse Midwife, [facility]

## 2023-01-15 NOTE — H&P (Signed)
OBSTETRIC ADMISSION HISTORY AND PHYSICAL  Shirley Terrell is a 37 y.o. female [phone] with IUP at 72w6dby LMP c/w 12wk ultrasound presenting for IOL for cHTN (on '5mg'$  norvasc daily + '100mg'$  labetalol BID). She reports +FMs, No LOF, no VB, no blurry vision, headaches or peripheral edema, and RUQ pain.  She plans on breasat feeding. She request BTL (consent signed 11/13/23) for birth control but will need bridge method if vaginal delivery due to previous hx of umbilical hernia mesh repair.  [facility]  Dating: By LMP c/w 12wk U/S --->  Estimated Date of Delivery: 01/30/23  Sono:  '@[redacted]w[redacted]d'$ , CWD, normal anatomy, cephalic presentation, posterior fundal placenta, 3069g, 76%ile, EFW 6lb 12oz  Prenatal History/Complications: had a previa that resolved, cHTN, and an antepartum placental abruption that resolved from 1/30-12/18/22  Nursing Staff Provider  Office Location  [facility] Dating  lmp=12  PMer[facility] HospitalModel [Valu.Nieves] Traditional '[ ]'$  Centering '[ ]'$  Mom-Baby Dyad    Language  English  Anatomy [place] Normal, previa resolved  Flu Vaccine  09/15/2022 Genetic/Carrier Screen  NIPS: LR female AFP: Negative Horizon: Negative x4  TDaP Vaccine   11/07/2022 Hgb A1C or  GTT Early neg Third trimester: Normal  COVID Vaccine    LAB RESULTS   Rhogam   Blood Type --/--/O POS (02/29 0725) O POS   Baby Feeding Plan Breast  Antibody NEG (02/29 0725)  Contraception BTL Rubella 1.20 (09/11 1010)  Circumcision If boy, Yes  RPR NON REACTIVE (01/30 1359)  Pediatrician  [facility]  HBsAg Negative (09/11 1010)  Support Person Greg FOB  HCVAb Negative  Prenatal Classes  HIV Non Reactive (12/22 0855)  BTL Consent Signed 11/13/23 GBS Negative/-- (02/16 [id number] (For PCN allergy, check sensitivities)   VBAC Consent  Pap '[ ]'$  rpt pap and hpv sept 2024       DME Rx '[ ]'$  BP cuff '[ ]'$  Weight Scale Waterbirth  '[ ]'$  Class '[ ]'$  Consent '[ ]'$  CNM visit  PHQ9 & GAD7 [  ] new OB [  ] 28 weeks  [  ] 36 weeks Induction  [  ] Orders Entered '[ ]'$ Foley Y/N   Past Medical History: Past Medical History:  Diagnosis Date   Anemia    takes iron supplement   Asthma    prn inhaler   Hypertension    states BP normalized while pregnant and since delivery; is currently not on med.   Lisfranc dislocation 11/30/2016   left   Pregnancy induced hypertension    with 2nd preg   Sleep apnea    uses cpap   Past Surgical History: Past Surgical History:  Procedure Laterality Date   CHOLECYSTECTOMY  04/13/2012   Procedure: LAPAROSCOPIC CHOLECYSTECTOMY WITH INTRAOPERATIVE CHOLANGIOGRAM;  Surgeon: TOdis Hollingshead MD;  [place]  Service: General;  Laterality: N/A;  laparoscopic cholecystectomy with IMosinee 08/13/2018   LAPROSCOPIC   INCISIONAL HERNIA REPAIR N/A 08/13/2018   Procedure: LAPAROSCOPIC INCISIONAL HERNIA REPAIR;  Surgeon: CClovis Riley MD;  [place]  Service: General;  Laterality: N/A;   INSERTION OF MESH N/A 08/13/2018   Procedure: INSERTION OF MESH;  Surgeon: CClovis Riley MD;  [place]  Service: General;  Laterality: N/A;   OPEN REDUCTION INTERNAL FIXATION (ORIF) FOOT LISFRANC FRACTURE Left 12/18/2016   Procedure: OPEN REDUCTION INTERNAL FIXATION (ORIF) FOOT LISFRANC FRACTURE;  Surgeon: JWylene Simmer MD;  [place]  Service: Orthopedics;  Laterality: Left;   Obstetrical History: OB History     Gravida  4   Para  2   Term  2   Preterm      AB  1   Living  2      SAB  1   IAB      Ectopic      Multiple  0   Live Births  2          Social History Social History   Socioeconomic History   Marital status: Single    Spouse name: Not on file   Number of children: Not on file   Years of education: Not on file   Highest education level: Some college, no degree  Occupational History   Not on file  Tobacco Use   Smoking status: Former    Packs/day: 0.00    Years: 3.00    Total pack years: 0.00     Types: Cigarettes    Quit date: 01/14/2023   Smokeless tobacco: Never   Tobacco comments:    occasionally  Vaping Use   Vaping Use: Never used  Substance and Sexual Activity   Alcohol use: Not Currently    Comment: occasionally   Drug use: No   Sexual activity: Yes    Partners: Male    Birth control/protection: None  Other Topics Concern   Not on file  Social History Narrative   She is single with 2 daughters, age 42 yrs and 1 yr in 2019.    Social Determinants of Health   Financial Resource Strain: Medium Risk (11/28/2022)   Overall Financial Resource Strain (CARDIA)    Difficulty of Paying Living Expenses: Somewhat hard  Food Insecurity: No Food Insecurity (12/16/2022)   Hunger Vital Sign    Worried About Running Out of Food in the Last Year: Never true    Ran Out of Food in the Last Year: Never true  Recent Concern: Food Insecurity - Food Insecurity Present (11/28/2022)   Hunger Vital Sign    Worried About Running Out of Food in the Last Year: Sometimes true    Ran Out of Food in the Last Year: Sometimes true  Transportation Needs: No Transportation Needs (12/16/2022)   PRAPARE - Hydrologist (Medical): No    Lack of Transportation (Non-Medical): No  Physical Activity: Insufficiently Active (11/28/2022)   Exercise Vital Sign    Days of Exercise per Week: 3 days    Minutes of Exercise per Session: 20 min  Stress: Stress Concern Present (11/28/2022)   [place]    Feeling of Stress : To some extent  Social Connections: Socially Isolated (11/28/2022)   Social Connection and Isolation Panel [NHANES]    Frequency of Communication with Friends and Family: More than three times a week    Frequency of Social Gatherings with Friends and Family: Never    Attends Religious Services: Never    Marine scientist or Organizations: No    Attends Music therapist: Not on file     Marital Status: Never married   Family History: Family History  Problem Relation Age of Onset   Hypertension Mother    Stroke Mother    Renal Disease Mother        due to blood pressure   Heart disease Father        MI, no stents   Depression Father  Hypertension Father    Stroke Father    Healthy Sister    Healthy Sister    Healthy Sister    Healthy Sister    Asthma Daughter    Hypertension Maternal Grandmother    Asthma Paternal Grandmother    Cancer Paternal Grandmother        breast   Diabetes Paternal Grandmother    Cancer Paternal Grandfather    Diabetes Other    Allergies: Allergies  Allergen Reactions   Pork-Derived Products     Pt unable to eat    Medications Prior to Admission  Medication Sig Dispense Refill Last Dose   amLODipine (NORVASC) 5 MG tablet Take 1 tablet (5 mg total) by mouth daily. 90 tablet 3 01/14/2023   aspirin EC 81 MG tablet Take 1 tablet (81 mg total) by mouth daily. 60 tablet 1 01/14/2023   labetalol (NORMODYNE) 100 MG tablet Take 1 tablet (100 mg total) by mouth 2 (two) times daily. 180 tablet 3 01/14/2023   Prenatal Vit-Fe Fumarate-FA (PRENATAL VITAMINS PO) Take 1 tablet by mouth daily.   01/14/2023   albuterol (VENTOLIN HFA) 108 (90 Base) MCG/ACT inhaler Inhale 2 puffs into the lungs every 6 (six) hours as needed for wheezing. 18 g 0 More than a month   Blood Pressure Monitoring (BLOOD PRESSURE KIT) DEVI 1 Device by Does not apply route as needed. 1 each 0    calcium carbonate (TUMS - DOSED IN MG ELEMENTAL CALCIUM) 500 MG chewable tablet Chew 1 tablet by mouth daily.      famotidine (PEPCID) 20 MG tablet Take 1 tablet (20 mg total) by mouth 2 (two) times daily. 60 tablet 3    Review of Systems  All systems reviewed and negative except as stated in HPI  Blood pressure (!) 153/88, pulse 97, temperature 98.2 F (36.8 C), temperature source Oral, resp. rate 16, last menstrual period 04/25/2022, unknown if currently breastfeeding. General  appearance: alert, cooperative, appears stated age, and no distress Lungs: clear to auscultation bilaterally Heart: regular rate and rhythm Abdomen: soft, non-tender; bowel sounds normal Pelvic: normal external female genitalia Extremities: Homans sign is negative, no sign of DVT DTR's normal Presentation: cephalic Fetal monitoring: Baseline: 140 bpm, Variability: Good {> 6 bpm), Accelerations: Reactive, and Decelerations: Absent Uterine activity: mostly mild UI Dilation: Fingertip Effacement (%): Thick Station: -3 Exam by:: Judson Roch, RN  Prenatal labs: ABO, Rh: --/--/O POS (02/29 0725) Antibody: NEG (02/29 0725) Rubella: 1.20 (09/11 1010) RPR: NON REACTIVE (01/30 1359)  HBsAg: Negative (09/11 1010)  HIV: Non Reactive (12/22 0855)  GBS: Negative/-- (02/16 0942)  2hr GTT: 89/149/121 Genetic screening: normal Anatomy U/S: normal once previa resolved  Prenatal Transfer Tool  Maternal Diabetes: No Genetic Screening: Normal Maternal Ultrasounds/Referrals: Normal Fetal Ultrasounds or other Referrals:  Referred to Materal Fetal Medicine  Maternal Substance Abuse:  No Significant Maternal Medications:  None Significant Maternal Lab Results:  Group B Strep negative Number of Prenatal Visits:greater than 3 verified prenatal visits Other Comments:  None  Results for orders placed or performed during the hospital encounter of 01/15/23 (from the past 24 hour(s))  CBC   Collection Time: 01/15/23  7:25 AM  Result Value Ref Range   WBC 10.7 (H) 4.0 - 10.5 K/uL   RBC 3.25 (L) 3.87 - 5.11 MIL/uL   Hemoglobin 10.2 (L) 12.0 - 15.0 g/dL   HCT 33.0 (L) 36.0 - 46.0 %   MCV 101.5 (H) 80.0 - 100.0 fL   MCH 31.4 26.0 - 34.0  pg   MCHC 30.9 30.0 - 36.0 g/dL   RDW 12.7 11.5 - 15.5 %   Platelets 179 150 - 400 K/uL   nRBC 0.0 0.0 - 0.2 %  Type and screen   Collection Time: 01/15/23  7:25 AM  Result Value Ref Range   ABO/RH(D) O POS    Antibody Screen NEG    Sample Expiration       01/18/2023,2359 Performed at [facility], [place] [address] [place], [place] [address]    Patient Active Problem List   Diagnosis Date Noted   Antepartum placental abruption 12/17/2022   Cervical dysplasia 08/11/2022   Supervision of high risk pregnancy, antepartum [id number]   S/p umbilical hernia mesh repair 08/13/2018   OSA (obstructive sleep apnea) 04/04/2018   Tobacco abuse 04/04/2018   Obesity affecting pregnancy 12/04/2017   Chronic hypertension during pregnancy, antepartum 01/09/2016   Assessment/Plan:  RAKEISHA BANDURA is a 37 y.o. [id number] at 34w6dhere for IOL for cHTN  #Labor: Latent, IOL begun with 50/236m combo loading dose of cytotec #Pain: Planning unmedicated delivery (or with fentanyl only) #FWB: Cat 1 #ID:  GBS neg #MOF: Breast #MOC: Yes #Circ:  BTL (will be interval if vaginal delivery) #cHTN: home daily meds ordered, no s/sx of PEC  JaGabriel CarinaCNM  01/15/2023, 10:54 AM

## 2023-01-15 NOTE — Progress Notes (Signed)
Patient ID: Shirley Terrell, female   DOB: December 07, 1985, 37 y.o.   MRN: [id number]  Labor Progress Note Shirley Terrell is a 37 y.o. [id number] at 13w6dpresented for IOL for cHTN  S:  Uncomfortable though contractions.   O:  BP 117/81   Pulse (!) 115   Temp 98 F (36.7 C) (Oral)   Resp 17   Ht '5\' 3"'$  (1.6 m)   Wt 101.6 kg   LMP 04/25/2022   BMI 39.68 kg/m  EFM: baseline 140 bpm/ moderate variability/ 15x15 accels/ no decels  Toco/IUPC: q3-3.54m SVE: Dilation: 4.5 Effacement (%): 70 Cervical Position: Posterior Station: -2 Presentation: Vertex Exam by:: CoEual FinesN Pitocin: to begin  A/P: [age].o. [id number]. Labor: Slow progression. AROM at this check with large amount of clear fluid. Continue to monitor  2. FWB: Cat 1 3. Pain: Per patient  4. cHTN: BP now well controlled post daily HTN meds  VicGifford ShaveD  OB Fellow

## 2023-01-15 NOTE — Progress Notes (Signed)
Patient ID: Shirley Terrell, female   DOB: [birth year], 37 y.o.   MRN: [id number]  Labor Progress Note IVAH CHAMORRO is a 37 y.o. [id number] at 37w6dpresented for IOL for cHTN  S:  Pt comfortable in bed, has been napping off and on.  O:  BP 129/86   Pulse (!) 104   Temp 98.2 F (36.8 C) (Oral)   Resp 18   Ht '5\' 3"'$  (1.6 m)   Wt 224 lb (101.6 kg)   LMP 04/25/2022   BMI 39.68 kg/m  EFM: baseline 140 bpm/ moderate variability/ 15x15 accels/ no decels  Toco/IUPC: q3-3.530m SVE: Dilation: 2 Effacement (%): 50, 60 Cervical Position: Posterior Station: -3 Presentation: Vertex Exam by:: J Veria Stradley CNM Pitocin: to begin  A/P: [age].o. [id number]. Labor: Latent, RN concerned bc head not as well applied and cervix harder to find.  2. FWB: Cat 1 3. Pain: Well tolerated currently 4. cHTN: BP now well controlled post daily HTN meds  Reassessed cervix and fetal presentation, cervix more anterior but baby high with plenty of posterior space. Pt also complaining of back pain. Discussed options, prefer to proceed with Pitocin titration and forward leaning upright positions to facilitate fetal rotation. Will AROM once appropriate. Anticipate SVD.  JamGaylan GeroldNM, MSN, IBCRuckersvillertified Nurse Midwife, [place]

## 2023-01-16 ENCOUNTER — Encounter (HOSPITAL_COMMUNITY): Payer: Self-pay | Admitting: Obstetrics & Gynecology

## 2023-01-16 ENCOUNTER — Encounter (HOSPITAL_COMMUNITY)
Admission: AD | Disposition: A | Discharge status: Home / Self Care | Payer: Self-pay | Source: Home / Self Care | Attending: Obstetrics & Gynecology

## 2023-01-16 ENCOUNTER — Encounter (HOSPITAL_COMMUNITY): Payer: Self-pay

## 2023-01-16 DIAGNOSIS — O99334 Smoking (tobacco) complicating childbirth: Secondary | ICD-10-CM | POA: Diagnosis not present

## 2023-01-16 DIAGNOSIS — O1002 Pre-existing essential hypertension complicating childbirth: Secondary | ICD-10-CM | POA: Diagnosis not present

## 2023-01-16 DIAGNOSIS — Z3A38 38 weeks gestation of pregnancy: Secondary | ICD-10-CM | POA: Diagnosis not present

## 2023-01-16 DIAGNOSIS — O99214 Obesity complicating childbirth: Secondary | ICD-10-CM | POA: Diagnosis not present

## 2023-01-16 SURGERY — LIGATION, FALLOPIAN TUBE, POSTPARTUM
Anesthesia: Choice | Laterality: Bilateral

## 2023-01-16 MED ORDER — FUROSEMIDE 20 MG PO TABS
20.0000 mg | ORAL_TABLET | Freq: Two times a day (BID) | ORAL | Status: DC
  Administered 2023-01-16: 20 mg via ORAL
  Filled 2023-01-16: qty 1

## 2023-01-16 MED ORDER — ACETAMINOPHEN 325 MG PO TABS
650.0000 mg | ORAL_TABLET | ORAL | Status: DC | PRN
  Administered 2023-01-16 – 2023-01-17 (×2): 650 mg via ORAL
  Filled 2023-01-16 (×2): qty 2

## 2023-01-16 MED ORDER — FENTANYL CITRATE (PF) 100 MCG/2ML IJ SOLN
50.0000 ug | INTRAMUSCULAR | Status: DC | PRN
  Administered 2023-01-16: 100 ug via INTRAVENOUS
  Filled 2023-01-16: qty 2

## 2023-01-16 MED ORDER — LABETALOL HCL 100 MG PO TABS: 100.0000 mg | ORAL_TABLET | Freq: Two times a day (BID) | ORAL | Status: DC

## 2023-01-16 MED ORDER — ZOLPIDEM TARTRATE 5 MG PO TABS: 5.0000 mg | ORAL_TABLET | Freq: Every evening | ORAL | Status: DC | PRN

## 2023-01-16 MED ORDER — COCONUT OIL OIL: 1.0000 | TOPICAL_OIL | Status: DC | PRN

## 2023-01-16 MED ORDER — PRENATAL MULTIVITAMIN CH
1.0000 | ORAL_TABLET | Freq: Every day | ORAL | Status: DC
  Administered 2023-01-16 – 2023-01-18 (×3): 1 via ORAL
  Filled 2023-01-16 (×3): qty 1

## 2023-01-16 MED ORDER — DIBUCAINE (PERIANAL) 1 % EX OINT: 1.0000 | TOPICAL_OINTMENT | CUTANEOUS | Status: DC | PRN

## 2023-01-16 MED ORDER — DIPHENHYDRAMINE HCL 25 MG PO CAPS: 25.0000 mg | ORAL_CAPSULE | Freq: Four times a day (QID) | ORAL | Status: DC | PRN

## 2023-01-16 MED ORDER — SENNOSIDES-DOCUSATE SODIUM 8.6-50 MG PO TABS
2.0000 | ORAL_TABLET | ORAL | Status: DC
  Administered 2023-01-16 – 2023-01-18 (×3): 2 via ORAL
  Filled 2023-01-16 (×3): qty 2

## 2023-01-16 MED ORDER — WITCH HAZEL-GLYCERIN EX PADS: 1.0000 | MEDICATED_PAD | CUTANEOUS | Status: DC | PRN

## 2023-01-16 MED ORDER — ONDANSETRON HCL 4 MG/2ML IJ SOLN: 4.0000 mg | INTRAMUSCULAR | Status: DC | PRN

## 2023-01-16 MED ORDER — BENZOCAINE-MENTHOL 20-0.5 % EX AERO
1.0000 | INHALATION_SPRAY | CUTANEOUS | Status: DC | PRN
  Administered 2023-01-16: 1 via TOPICAL
  Filled 2023-01-16: qty 56

## 2023-01-16 MED ORDER — AMLODIPINE BESYLATE 5 MG PO TABS: 5.0000 mg | ORAL_TABLET | Freq: Every day | ORAL | Status: DC

## 2023-01-16 MED ORDER — IBUPROFEN 600 MG PO TABS
600.0000 mg | ORAL_TABLET | Freq: Four times a day (QID) | ORAL | Status: DC
  Administered 2023-01-16 – 2023-01-18 (×10): 600 mg via ORAL
  Filled 2023-01-16 (×10): qty 1

## 2023-01-16 MED ORDER — TETANUS-DIPHTH-ACELL PERTUSSIS 5-2.5-18.5 LF-MCG/0.5 IM SUSY: 0.5000 mL | PREFILLED_SYRINGE | Freq: Once | INTRAMUSCULAR | Status: DC

## 2023-01-16 MED ORDER — HYDROCHLOROTHIAZIDE 25 MG PO TABS
25.0000 mg | ORAL_TABLET | Freq: Every day | ORAL | Status: DC
  Administered 2023-01-16 – 2023-01-18 (×3): 25 mg via ORAL
  Filled 2023-01-16 (×4): qty 1

## 2023-01-16 MED ORDER — ONDANSETRON HCL 4 MG PO TABS: 4.0000 mg | ORAL_TABLET | ORAL | Status: DC | PRN

## 2023-01-16 MED ORDER — SIMETHICONE 80 MG PO CHEW: 80.0000 mg | CHEWABLE_TABLET | ORAL | Status: DC | PRN

## 2023-01-16 NOTE — Discharge Summary (Signed)
Postpartum Discharge Summary  Date of Service updated 01/17/23     Patient Name: Shirley Terrell DOB: 11/27/1985 MRN: [id number]  Date of admission: 01/15/2023 Delivery date:01/16/2023  Delivering provider: Concepcion Living  Date of discharge: 01/17/2023  Admitting diagnosis: Chronic hypertension during pregnancy, antepartum [O10.919] Intrauterine pregnancy: [redacted]w[redacted]d     Secondary diagnosis:  Principal Problem:   Chronic hypertension during pregnancy, antepartum Active Problems:   Obesity affecting pregnancy   OSA (obstructive sleep apnea)   Tobacco abuse   S/p umbilical hernia mesh repair   Supervision of high risk pregnancy, antepartum   Vaginal delivery  Additional problems: None    Discharge diagnosis: Term Pregnancy Delivered and cHTN                                               Post partum procedures: none Augmentation: AROM, Pitocin, and Cytotec Complications: None  Hospital course: Induction of Labor With Vaginal Delivery   37 y.o. yo [id number] at [redacted]w[redacted]d was admitted to the hospital 01/15/2023 for induction of labor.  Indication for induction:  cHTN .  Patient had an uncomplicated labor course. Membrane Rupture Time/Date: 11:27 PM ,01/15/2023   Delivery Method:Vaginal, Spontaneous  Episiotomy: None  Lacerations:  None  Details of delivery can be found in separate delivery note.  Patient had a postpartum course that was uncomplicated. Patient is discharged home 01/17/23.  Chronic Hypertension Pt initially on amlodipine and labetalol on admission. Postpartum BP was elevated, so medications switched to amlodipine, Coreg, and HCTZ. BP wnl at time of discharge.   Newborn Data: Birth date:01/16/2023  Birth time:1:37 AM  Gender:Female  Living status:Living  Apgars:3 ,8  Weight:3210 g   Magnesium Sulfate received: No BMZ received: No Rhophylac:N/A MMR:N/A T-DaP:Given prenatally Flu: No Transfusion:No  Physical exam  Vitals:   01/16/23 1310 01/16/23 1707 01/16/23 1941  01/17/23 0637  BP: 138/84 (!) 138/94 (S) (!) 136/91 127/77  Pulse: 90 (!) 102 87 88  Resp: 18 17 18 18   Temp: 98.4 F (36.9 C) (!) 97.5 F (36.4 C) 97.8 F (36.6 C) 98.4 F (36.9 C)  TempSrc: Oral Oral Oral Oral  SpO2:  100%    Weight:      Height:       General: alert, cooperative, and no distress Lochia: appropriate Uterine Fundus: firm Incision: N/A DVT Evaluation: No evidence of DVT seen on physical exam. No significant calf/ankle edema. Labs: Lab Results  Component Value Date   WBC 10.7 (H) 01/15/2023   HGB 10.2 (L) 01/15/2023   HCT 33.0 (L) 01/15/2023   MCV 101.5 (H) 01/15/2023   PLT 179 01/15/2023      Latest Ref Rng & Units 11/07/2022   10:59 AM  CMP  Glucose 70 - 99 mg/dL 87   BUN 6 - 20 mg/dL 3   Creatinine 0.57 - 1.00 mg/dL 0.44   Sodium 134 - 144 mmol/L 140   Potassium 3.5 - 5.2 mmol/L 4.3   Chloride 96 - 106 mmol/L 107   CO2 20 - 29 mmol/L 21   Calcium 8.7 - 10.2 mg/dL 8.4   Total Protein 6.0 - 8.5 g/dL 5.4   Total Bilirubin 0.0 - 1.2 mg/dL 0.2   Alkaline Phos 44 - 121 IU/L 111   AST 0 - 40 IU/L 12   ALT 0 - 32 IU/L 7  Edinburgh Score:    01/16/2023    5:00 AM  Edinburgh Postnatal Depression Scale Screening Tool  I have been able to laugh and see the funny side of things. 1  I have looked forward with enjoyment to things. 2  I have blamed myself unnecessarily when things went wrong. 1  I have been anxious or worried for no good reason. 0  I have felt scared or panicky for no good reason. 0  Things have been getting on top of me. 2  I have been so unhappy that I have had difficulty sleeping. 1  I have felt sad or miserable. 0  I have been so unhappy that I have been crying. 0  The thought of harming myself has occurred to me. 0  Edinburgh Postnatal Depression Scale Total 7     After visit meds:  Allergies as of 01/17/2023       Reactions   Pork-derived Products    Pt unable to eat         Medication List     STOP taking these  medications    aspirin EC 81 MG tablet   labetalol 100 MG tablet Commonly known as: NORMODYNE       TAKE these medications    acetaminophen 325 MG tablet Commonly known as: Tylenol Take 2 tablets (650 mg total) by mouth every 4 (four) hours as needed (for pain scale < 4).   albuterol 108 (90 Base) MCG/ACT inhaler Commonly known as: VENTOLIN HFA Inhale 2 puffs into the lungs every 6 (six) hours as needed for wheezing.   amLODipine 5 MG tablet Commonly known as: NORVASC Take 1 tablet (5 mg total) by mouth daily.   Blood Pressure Kit Devi 1 Device by Does not apply route as needed.   calcium carbonate 500 MG chewable tablet Commonly known as: TUMS - dosed in mg elemental calcium Chew 1 tablet by mouth daily.   carvedilol 12.5 MG tablet Commonly known as: COREG Take 1 tablet (12.5 mg total) by mouth daily.   famotidine 20 MG tablet Commonly known as: PEPCID Take 1 tablet (20 mg total) by mouth 2 (two) times daily.   hydrochlorothiazide 25 MG tablet Commonly known as: HYDRODIURIL Take 1 tablet (25 mg total) by mouth daily.   ibuprofen 600 MG tablet Commonly known as: ADVIL Take 1 tablet (600 mg total) by mouth every 6 (six) hours.   PRENATAL VITAMINS PO Take 1 tablet by mouth daily.         Discharge home in stable condition Infant Feeding: Breast Infant Disposition:home with mother Discharge instruction: per After Visit Summary and Postpartum booklet. Activity: Advance as tolerated. Pelvic rest for 6 weeks.  Diet: routine diet Future Appointments: Future Appointments  Date Time Provider [place]  01/22/2023  9:20 AM [facility] NURSE [facility]  02/26/2023 10:35 AM Donnamae Jude, MD [facility]  05/01/2023  2:00 PM Freada Bergeron, MD [facility] None   Follow up Visit: Message sent   Please schedule this patient for a In person postpartum visit in 6 weeks with the following provider: Any provider. Additional Postpartum F/U:BP check 1 week   High risk pregnancy complicated by: HTN Delivery mode:  Vaginal, Spontaneous  Anticipated Birth Control:  Unsure, planning interval tubal   01/17/2023 Arlyce Dice, MD

## 2023-01-16 NOTE — Progress Notes (Signed)
OB Note D/w pt that I still don't recommend a ppBTL, which she is fine with; pt got inadvertently scheduled by RN. Other options d/w her, namely nexplanon or depo prior to discharge.   Durene Romans MD Attending Center for [employer] (Faculty Practice) 01/16/2023 Time: [phone]

## 2023-01-16 NOTE — Lactation Note (Addendum)
This note was copied from a baby's chart. Lactation Consultation Note  Patient Name: Shirley Terrell [id number] Date: 01/16/2023 Age:35 hours Reason for consult: Initial assessment;Early term 37-38.6wks  Per Birth Parent, she feels breastfeeding is going well and infant is BF 15 to 20 minutes most feeding, she has no BF concerns for LC at this time. Infant had 3 voids and 2 stools since birth. Birth Parent will continue to BF infant according to hunger cues, on demand, 8 to 12+ times within 24 hours, STS.  LC discussed that infant may cluster feed 2nd day of life and this is normal infant feeding behavior. Birth Parent is experienced with BF see maternal data below. LC discussed infant's input and output. Importance of maternal rest, diet and hydration of Birth Parent. Birth Parent was made aware of O/P services, breastfeeding support groups, community resources, and our phone # for post-discharge questions.    Maternal Data Has patient been taught Hand Expression?: Yes Does the patient have breastfeeding experience prior to this delivery?: Yes How long did the patient breastfeed?: Birth Parent BF 1st child for one year and 2nd child for 6 months due to breasking her foot.  Feeding Mother's Current Feeding Choice: Breast Milk  LATCH Score  No latch observed at this time. Infant asleep in basinet and last BF at 1645 pm                  Lactation Tools Discussed/Used    Interventions Interventions: Breast feeding basics reviewed;Education;LC Services brochure;Skin to skin  Discharge Pump: Personal;DEBP (Per Birth Parent, she has Spectra 2 DEBP at home.)  Consult Status Consult Status: Follow-up Date: 01/17/23 Follow-up type: In-patient    [name] 01/16/2023, 7:44 PM

## 2023-01-16 NOTE — Plan of Care (Signed)
  Problem: Education: Goal: Knowledge of condition will improve Outcome: Progressing   Problem: Activity: Goal: Will verbalize the importance of balancing activity with adequate rest periods Outcome: Progressing Goal: Ability to tolerate increased activity will improve Outcome: Progressing   

## 2023-01-16 NOTE — Lactation Note (Signed)
This note was copied from a baby's chart. Lactation Consultation Note  Patient Name: Boy Saralee Gentzel [id number] Date: 01/16/2023 Age:35 hours Reason for consult: L&D Initial assessment;Early term 6-38.6wks Birth Parent latched infant on her left breast using the cross cradle hold, infant latched with depth, sustained latch for 13 minutes. Birth Parent is experienced with breast feeding see maternal data below. Birth Parent will continue to BF infant according to hunger cues, on demand, 8 to 12+ times within 24 hours,STS. Birth Parent knows to ask RN/LC for further latch assistance if needed on MBU.   Maternal Data Does the patient have breastfeeding experience prior to this delivery?: Yes How long did the patient breastfeed?: Per Birth Parent BF 1st child for one year and 2nd stopped at 6 months due to breaking her foot  Feeding Mother's Current Feeding Choice: Breast Milk  LATCH Score Latch: Grasps breast easily, tongue down, lips flanged, rhythmical sucking.  Audible Swallowing: Spontaneous and intermittent  Type of Nipple: Everted at rest and after stimulation  Comfort (Breast/Nipple): Soft / non-tender  Hold (Positioning): Assistance needed to correctly position infant at breast and maintain latch.  LATCH Score: 9   Lactation Tools Discussed/Used    Interventions Interventions: Support pillows;Position options;Adjust position;Assisted with latch;Skin to skin;Breast compression;Education  Discharge    Consult Status Consult Status: Follow-up from L&D    [name] 01/16/2023, 2:50 AM

## 2023-01-17 MED ORDER — CARVEDILOL 12.5 MG PO TABS: 12.5000 mg | ORAL_TABLET | Freq: Every day | ORAL | 0 refills | Status: DC

## 2023-01-17 MED ORDER — IBUPROFEN 600 MG PO TABS: 600.0000 mg | ORAL_TABLET | Freq: Four times a day (QID) | ORAL | 0 refills | Status: DC

## 2023-01-17 MED ORDER — ACETAMINOPHEN 325 MG PO TABS: 650.0000 mg | ORAL_TABLET | ORAL | 0 refills | Status: DC | PRN

## 2023-01-17 MED ORDER — CARVEDILOL 12.5 MG PO TABS
12.5000 mg | ORAL_TABLET | Freq: Every day | ORAL | Status: DC
  Administered 2023-01-17 – 2023-01-18 (×2): 12.5 mg via ORAL
  Filled 2023-01-17 (×3): qty 1

## 2023-01-17 MED ORDER — HYDROCHLOROTHIAZIDE 25 MG PO TABS: 25.0000 mg | ORAL_TABLET | Freq: Every day | ORAL | 0 refills | Status: DC

## 2023-01-17 MED ORDER — FUROSEMIDE 20 MG PO TABS: 20.0000 mg | ORAL_TABLET | Freq: Every day | ORAL | Status: DC

## 2023-01-17 NOTE — Progress Notes (Signed)
Notified that baby is staying and mom would like to stay. Will undo discharge for pt today. D/C tomororwe.   Shirley Terrell

## 2023-01-17 NOTE — Progress Notes (Signed)
Post Partum Day 1 Subjective: no complaints, up ad lib, voiding, tolerating PO, and + flatus  Objective: Blood pressure 132/79, pulse 97, temperature 98 F (36.7 C), temperature source Oral, resp. rate 18, height '5\' 3"'$  (1.6 m), weight 101.6 kg, last menstrual period 04/25/2022, SpO2 100 %, unknown if currently breastfeeding.  Physical Exam:  General: alert, cooperative, and no distress Lochia: appropriate Uterine Fundus: firm Incision: n/a DVT Evaluation: No evidence of DVT seen on physical exam.  Recent Labs    01/15/23 0725  HGB 10.2*  HCT 33.0*    Assessment/Plan: Plan for discharge tomorrow Interval BTL   LOS: 2 days   Fatima Blank, CNM 01/17/2023, 2:40 PM

## 2023-01-17 NOTE — Discharge Summary (Signed)
Postpartum Discharge Summary  Date of Service updated 01/17/23     Patient Name: Shirley Terrell DOB: Dec 24, 1985 MRN: [id number]  Date of admission: 01/15/2023 Delivery date:01/16/2023  Delivering provider: Celedonio Savage  Date of discharge: **  Admitting diagnosis: Chronic hypertension during pregnancy, antepartum [O10.919] Intrauterine pregnancy: [redacted]w[redacted]d     Secondary diagnosis:  Principal Problem:   Chronic hypertension during pregnancy, antepartum Active Problems:   Obesity affecting pregnancy   OSA (obstructive sleep apnea)   Tobacco abuse   S/p umbilical hernia mesh repair   Supervision of high risk pregnancy, antepartum   Vaginal delivery  Additional problems: None    Discharge diagnosis: Term Pregnancy Delivered and cHTN                                              Post partum procedures:none Augmentation: AROM, Pitocin, and Cytotec Complications: None  Hospital course: Induction of Labor With Vaginal Delivery   37 y.o. yo [id number] at [redacted]w[redacted]d was admitted to the hospital 01/15/2023 for induction of labor.  Indication for induction: cHTN.  Patient had an uncomplicated labor course. Membrane Rupture Time/Date: 11:27 PM ,01/15/2023   Delivery Method:Vaginal, Spontaneous  Episiotomy: None  Lacerations:  None  Details of delivery can be found in separate delivery note.  Patient had a postpartum course that was uncomplicated. Patient is discharged home 01/17/23.  Chronic Hypertension Pt initially on amlodipine and labetalol on admission. Postpartum BP was elevated, so medications switched to amlodipine 10mg , Coreg, and HCTZ, and she will be discharged on this regimen. She has a follow up scheduled for a BP check in the office in 1 week. She is stable for discharge home later today.  Newborn Data: Birth date:01/16/2023  Birth time:1:37 AM  Gender:Female  Living status:Living  Apgars:3 ,8  Weight:3210 g   Magnesium Sulfate received: No BMZ received:  No Rhophylac:N/A MMR:N/A T-DaP:Given prenatally Flu: No Transfusion:No  Physical exam  Patient Vitals for the past 24 hrs:  BP Temp Temp src Pulse Resp SpO2  01/18/23 0510 (!) 146/90 98 F (36.7 C) Oral 95 18 98 %  01/17/23 1929 (!) 136/92 98.2 F (36.8 C) Oral 96 18 98 %  01/17/23 1335 132/79 98 F (36.7 C) Oral 97 -- 100 %    General: alert, cooperative, and no distress Lochia: appropriate Uterine Fundus: firm Incision: N/A DVT Evaluation: No evidence of DVT seen on physical exam. No significant calf/ankle edema. Labs: Lab Results  Component Value Date   WBC 10.7 (H) 01/15/2023   HGB 10.2 (L) 01/15/2023   HCT 33.0 (L) 01/15/2023   MCV 101.5 (H) 01/15/2023   PLT 179 01/15/2023      Latest Ref Rng & Units 11/07/2022   10:59 AM  CMP  Glucose 70 - 99 mg/dL 87   BUN 6 - 20 mg/dL 3   Creatinine 1.30 - 8.65 mg/dL 7.84   Sodium 696 - 295 mmol/L 140   Potassium 3.5 - 5.2 mmol/L 4.3   Chloride 96 - 106 mmol/L 107   CO2 20 - 29 mmol/L 21   Calcium 8.7 - 10.2 mg/dL 8.4   Total Protein 6.0 - 8.5 g/dL 5.4   Total Bilirubin 0.0 - 1.2 mg/dL 0.2   Alkaline Phos 44 - 121 IU/L 111   AST 0 - 40 IU/L 12   ALT 0 - 32 IU/L 7  Edinburgh Score:    01/16/2023    5:00 AM  Edinburgh Postnatal Depression Scale Screening Tool  I have been able to laugh and see the funny side of things. 1  I have looked forward with enjoyment to things. 2  I have blamed myself unnecessarily when things went wrong. 1  I have been anxious or worried for no good reason. 0  I have felt scared or panicky for no good reason. 0  Things have been getting on top of me. 2  I have been so unhappy that I have had difficulty sleeping. 1  I have felt sad or miserable. 0  I have been so unhappy that I have been crying. 0  The thought of harming myself has occurred to me. 0  Edinburgh Postnatal Depression Scale Total 7     After visit meds:  Allergies as of 01/17/2023       Reactions   Pork-derived Products     Pt unable to eat         Medication List     STOP taking these medications    aspirin EC 81 MG tablet   labetalol 100 MG tablet Commonly known as: NORMODYNE       TAKE these medications    acetaminophen 325 MG tablet Commonly known as: Tylenol Take 2 tablets (650 mg total) by mouth every 4 (four) hours as needed (for pain scale < 4).   albuterol 108 (90 Base) MCG/ACT inhaler Commonly known as: VENTOLIN HFA Inhale 2 puffs into the lungs every 6 (six) hours as needed for wheezing.   amLODipine 5 MG tablet Commonly known as: NORVASC Take 1 tablet (5 mg total) by mouth daily.   Blood Pressure Kit Devi 1 Device by Does not apply route as needed.   calcium carbonate 500 MG chewable tablet Commonly known as: TUMS - dosed in mg elemental calcium Chew 1 tablet by mouth daily.   carvedilol 12.5 MG tablet Commonly known as: COREG Take 1 tablet (12.5 mg total) by mouth daily.   famotidine 20 MG tablet Commonly known as: PEPCID Take 1 tablet (20 mg total) by mouth 2 (two) times daily.   hydrochlorothiazide 25 MG tablet Commonly known as: HYDRODIURIL Take 1 tablet (25 mg total) by mouth daily.   ibuprofen 600 MG tablet Commonly known as: ADVIL Take 1 tablet (600 mg total) by mouth every 6 (six) hours.   PRENATAL VITAMINS PO Take 1 tablet by mouth daily.         Discharge home in stable condition Infant Feeding: Breast Infant Disposition:home with mother Discharge instruction: per After Visit Summary and Postpartum booklet. Activity: Advance as tolerated. Pelvic rest for 6 weeks.  Diet: routine diet Future Appointments: Future Appointments  Date Time Provider Department Center  01/22/2023  9:20 AM [facility] NURSE [facility]  02/26/2023 10:35 AM Reva Bores, MD [facility]  05/01/2023  2:00 PM Meriam Sprague, MD [facility] None   Follow up Visit: Message sent   Please schedule this patient for a In person postpartum visit in 6 weeks with the  following provider: Any provider. Additional Postpartum F/U:BP check 1 week  High risk pregnancy complicated by: HTN Delivery mode:  Vaginal, Spontaneous  Anticipated Birth Control:  Unsure, planning interval tubal   Sheppard Evens MD MPH OB Fellow, Faculty Practice [facility], Center for [facility] 01/18/2023

## 2023-01-18 MED ORDER — AMLODIPINE BESYLATE 5 MG PO TABS: 10.0000 mg | ORAL_TABLET | Freq: Every day | ORAL | 3 refills | Status: DC

## 2023-01-18 MED ORDER — CARVEDILOL 12.5 MG PO TABS: 12.5000 mg | ORAL_TABLET | Freq: Every day | ORAL | 0 refills | Status: DC

## 2023-01-18 MED ORDER — AMLODIPINE BESYLATE 5 MG PO TABS
10.0000 mg | ORAL_TABLET | Freq: Every day | ORAL | Status: DC
  Administered 2023-01-18: 10 mg via ORAL
  Filled 2023-01-18: qty 2

## 2023-01-18 NOTE — Lactation Note (Signed)
This note was copied from a baby's chart. Lactation Consultation Note  Patient Name: Shirley Terrell [id number] Date: 01/18/2023 Age:35 hours Reason for consult: Follow-up assessment;Early term 37-38.6wks;Hyperbilirubinemia  LC in to room for follow up. Infant is skin to skin with phototherapy upon arrival. Birthing parent currently latching and supplementing with formula via bottle to improve bili levels. No stool yet. LC encouraged pumping for 15 minutes (initiation setting), 8-12 a day as parent's goals is to breastfeed exclusively. Reinforced using expressed breast milk to feed infant prior to offering formula.  Parent is looking forward to go home soon. Discussed local resources available upon discharge.   Plan: 1-Feeding on demand or 8-12 times in 24h period. 2-Hand express/pump as needed for supplementation 3-Encouraged birthing parent rest, hydration and food intake.   Contact LC as needed for feeds/support/concerns/questions. All questions answered at this time.   Maternal Data Has patient been taught Hand Expression?: No Does the patient have breastfeeding experience prior to this delivery?: Yes  Feeding Mother's Current Feeding Choice: Breast Milk and Formula  LATCH Score No latch observed during this encounter.    Lactation Tools Discussed/Used Tools: Pump Breast pump type: Double-Electric Breast Pump;Manual Pumping frequency: encouraged to pump at least 8 times per 24h  Interventions Interventions: Breast feeding basics reviewed;Hand express;Breast massage;Expressed milk;DEBP;Education  Discharge Discharge Education: Engorgement and breast care;Warning signs for feeding baby Pump: Personal;Manual;DEBP WIC Program: Yes  Consult Status Consult Status: Follow-up Date: 01/19/23 Follow-up type: In-patient    Sashay Felling A Higuera Ancidey 01/18/2023, 11:25 AM

## 2023-01-19 ENCOUNTER — Ambulatory Visit: Payer: Self-pay

## 2023-01-19 NOTE — Lactation Note (Signed)
This note was copied from a baby's chart. Lactation Consultation Note  Patient Name: [name] [id number] Date: 01/19/2023 Age:35 days Reason for consult: Follow-up assessment;Hyperbilirubinemia;Early term 37-38.6wks Mom BF when LC came on having baby on DPT while BF. Baby BF well. Mom denies painful latch or difficulty BF. Mom is able to pump around 50 ml when pumping and is giving it to the baby then alternating w/formula if she hasn't pumped. Mom is BF then supplementing w/BM or formula. Mom feels like things are going well. Encouraged to call for assistance or questions.   Maternal Data    Feeding Mother's Current Feeding Choice: Breast Milk and Formula  LATCH Score Latch: Grasps breast easily, tongue down, lips flanged, rhythmical sucking.  Audible Swallowing: Spontaneous and intermittent  Type of Nipple: Everted at rest and after stimulation  Comfort (Breast/Nipple): Filling, red/small blisters or bruises, mild/mod discomfort (filling)  Hold (Positioning): No assistance needed to correctly position infant at breast.  LATCH Score: 9   Lactation Tools Discussed/Used Breast pump type: Double-Electric Breast Pump Pump Education: Milk Storage Reason for Pumping: supplementation Pumping frequency: q4 times day  Interventions    Discharge    Consult Status Consult Status: Follow-up Date: 01/20/23 Follow-up type: In-patient    Theodoro Kalata 01/19/2023, 10:08 PM

## 2023-01-20 ENCOUNTER — Encounter: Payer: Self-pay | Admitting: *Deleted

## 2023-01-21 ENCOUNTER — Encounter: Payer: Self-pay | Admitting: Obstetrics and Gynecology

## 2023-01-22 ENCOUNTER — Ambulatory Visit (INDEPENDENT_AMBULATORY_CARE_PROVIDER_SITE_OTHER): Payer: Medicaid Other | Admitting: *Deleted

## 2023-01-22 ENCOUNTER — Other Ambulatory Visit: Payer: Self-pay

## 2023-01-22 VITALS — BP 138/83 | HR 94 | Ht 63.0 in | Wt 213.2 lb

## 2023-01-22 DIAGNOSIS — Z0130 Encounter for examination of blood pressure without abnormal findings: Secondary | ICD-10-CM

## 2023-01-22 NOTE — Progress Notes (Signed)
Here for bp check. Had vaginal delivery on 3/1 at 38weeks with CHTN. She is taking all 3 bp meds and took today at 7am. She denies headaches or edema. BP wnl. Instructed to continue taking meds as ordered. Pre-eclampsia symptoms reviewed and postpartum appointment. She voices understanding.  Staci Acosta

## 2023-01-25 ENCOUNTER — Ambulatory Visit: Payer: Self-pay

## 2023-01-25 NOTE — Lactation Note (Signed)
This note was copied from a baby's chart. Lactation Consultation Note  Patient Name: Donnetta Hutching [id number] Date: 01/25/2023 Age:35 days Reason for consult: Initial assessment;Early term 37-38.6wks;Hyperbilirubinemia;Other (Comment) (Pediatric consult) Mom was BF baby when [place] came into rm. LC suggested to re-positioned body alignment and mom did so. Baby was supine BF in cradle position. Mom is post pumping and supplement BM to baby. Mom denies painful latches. When baby came off nipple was pinched and mom acted like baby wouldn't let go of the breast. Mom asked the baby if he was finished and she unlatched him with her finger. LC made comment that the nipple was pinched and mom stated that was because he just wanted to hold it and not come off. Mom stated it doesn't hurt when he BF. LC tried to listen for swallows but I really couldn't hear d/t noise level but I watched for swallows. Mom stated she feels her breast are softer after he BF. Mom is pumping and giving him back BM. Mom's milk is in. Praised mom for all of her hard work in BF, pumping and feeding baby.  Mom stated this baby is BF a lot better than her other two babies did. Mom stated he doesn't hurt her nipples like the other 2 children did. Baby has a thick labial frenulum. Tongue cups under finger but slightly. Baby had a high narrow palate. Baby may need to have posterior frenulum assessed. Baby has good output and not a big weight loss so I don't think it's a milk transfer issue. Would need to do maybe pre-post weights of feedings to really assess this.  Mom is to call for feeding assistance if needed.  Maternal Data    Feeding    LATCH Score Latch: Grasps breast easily, tongue down, lips flanged, rhythmical sucking.  Audible Swallowing: Spontaneous and intermittent  Type of Nipple: Everted at rest and after stimulation  Comfort (Breast/Nipple): Soft / non-tender  Hold (Positioning): Assistance needed to correctly  position infant at breast and maintain latch. ([place] ajusted body alignment)  LATCH Score: 9   Lactation Tools Discussed/Used Tools: Pump Breast pump type: Double-Electric Breast Pump Pump Education: Milk Storage Reason for Pumping: supplementation Pumping frequency: q3hr  Interventions Interventions: Breast feeding basics reviewed;Adjust position;Skin to skin;Breast compression  Discharge    Consult Status Consult Status: PRN Date: 01/26/23 Follow-up type: In-patient    Theodoro Kalata 01/25/2023, 10:17 PM

## 2023-01-29 ENCOUNTER — Encounter: Payer: Self-pay | Admitting: Obstetrics and Gynecology

## 2023-02-05 ENCOUNTER — Other Ambulatory Visit: Payer: Self-pay

## 2023-02-05 ENCOUNTER — Encounter: Payer: Self-pay | Admitting: Obstetrics and Gynecology

## 2023-02-09 ENCOUNTER — Other Ambulatory Visit: Payer: Self-pay | Admitting: Family Medicine

## 2023-02-16 ENCOUNTER — Telehealth (HOSPITAL_COMMUNITY): Payer: Self-pay

## 2023-02-16 NOTE — Telephone Encounter (Signed)
Chart review.

## 2023-02-25 ENCOUNTER — Other Ambulatory Visit: Payer: Self-pay | Admitting: Obstetrics and Gynecology

## 2023-02-26 ENCOUNTER — Ambulatory Visit (INDEPENDENT_AMBULATORY_CARE_PROVIDER_SITE_OTHER): Payer: Medicaid Other | Admitting: Family Medicine

## 2023-02-26 ENCOUNTER — Encounter: Payer: Self-pay | Admitting: Family Medicine

## 2023-02-26 VITALS — BP 131/85 | HR 82 | Wt 205.8 lb

## 2023-02-26 DIAGNOSIS — Z658 Other specified problems related to psychosocial circumstances: Secondary | ICD-10-CM

## 2023-02-26 NOTE — Progress Notes (Signed)
Post Partum Visit Note  Shirley Terrell is a 37 y.o. [phone] female who presents for a postpartum visit. She is 5 weeks postpartum following a normal spontaneous vaginal delivery.  I have fully reviewed the prenatal and intrapartum course. The delivery was at [redacted]w[redacted]d gestational weeks.  Anesthesia: none. Postpartum course has been hard with poor sleep and depressed mood. Baby is doing well. Baby is feeding by breast. Bleeding no bleeding. Bowel function is normal. Bladder function is normal. Patient is not sexually active. Contraception method is planned tubal ligation. Postpartum depression screening: negative.   Upstream - 02/26/23 1126       Pregnancy Intention Screening   Does the patient want to become pregnant in the next year? No    Does the patient's partner want to become pregnant in the next year? N/A    Would the patient like to discuss contraceptive options today? Yes      Contraception Wrap Up   Current Method No Method - Other Reason    Reason for No Current Contraceptive Method at Intake ([facility] Only) Abstinence    End Method Female Sterilization    Contraception Counseling Provided Yes            The pregnancy intention screening data noted above was reviewed. Potential methods of contraception were discussed. The patient elected to proceed with Female Sterilization.   Edinburgh Postnatal Depression Scale - 02/26/23 1124       Edinburgh Postnatal Depression Scale:  In the Past 7 Days   I have been able to laugh and see the funny side of things. 1    I have looked forward with enjoyment to things. 0    I have blamed myself unnecessarily when things went wrong. 2    I have been anxious or worried for no good reason. 2    I have felt scared or panicky for no good reason. 0    Things have been getting on top of me. 3    I have been so unhappy that I have had difficulty sleeping. 0    I have felt sad or miserable. 1    I have been so unhappy that I have been crying.  0    The thought of harming myself has occurred to me. 0    Edinburgh Postnatal Depression Scale Total 9             Health Maintenance Due  Topic Date Due   COVID-19 Vaccine (1) Never done    The following portions of the patient's history were reviewed and updated as appropriate: allergies, current medications, past family history, past medical history, past social history, past surgical history, and problem list.  Review of Systems Pertinent items noted in HPI and remainder of comprehensive ROS otherwise negative.  Objective:  BP 131/85   Pulse 82   Wt 205 lb 12.8 oz (93.4 kg)   LMP 04/25/2022   Breastfeeding Yes   BMI 36.46 kg/m    General:  alert, cooperative, and appears stated age  Lungs: Normal effort  Heart:  regular rate and rhythm  Abdomen: soft, non-tender; bowel sounds normal; no masses,  no organomegaly        Assessment:   Normal postpartum exam.   Plan:   Essential components of care per ACOG recommendations:  1.  Mood and well being: Patient with positive depression screening today. Reviewed local resources for support. Amb referral to IBH done Discussed self care Meds if needed  -  Patient tobacco use? No.   - hx of drug use? No.    2. Infant care and feeding:  -Patient currently breastmilk feeding? Yes. Reviewed importance of draining breast regularly to support lactation.  -Social determinants of health (SDOH) reviewed in EPIC. No concerns  3. Sexuality, contraception and birth spacing - Patient does not want a pregnancy in the next year.  Desired family size is 3 children.  - Reviewed reproductive life planning. Reviewed contraceptive methods based on pt preferences and effectiveness.  Patient desired Female Sterilization today.  Patient counseled, r.e. Risks benefits of BTL, including permanency of procedure, discussed benefits of salpingectomy including almost no failure and prevention of ovarian cancer. Patient verbalized understanding and  desires to proceed Risks include but are not limited to bleeding, infection, injury to surrounding structures, including bowel, bladder and ureters, blood clots, and death.  Likelihood of success is high.  4. Sleep and fatigue -Encouraged family/partner/community support of 4 hrs of uninterrupted sleep to help with mood and fatigue  5. Physical Recovery  - Discussed patients delivery and complications. She describes her labor as good. - Patient had a Vaginal, no problems at delivery. Patient had no laceration. Perineal healing reviewed. Patient expressed understanding - Patient has urinary incontinence? No. - Patient is safe to resume physical and sexual activity  6.  Health Maintenance - HM due items addressed Yes - Last pap smear  Diagnosis  Date Value Ref Range Status  07/28/2022 (A)  Final   - Atypical squamous cells of undetermined significance (ASC-US)   Pap smear not done at today's visit.  -Breast Cancer screening indicated? No.   7. Chronic Disease/Pregnancy Condition follow up: Hypertension  - PCP follow up  Reva Bores, MD [facility], [facility]

## 2023-03-06 ENCOUNTER — Telehealth: Payer: Self-pay

## 2023-03-06 NOTE — Telephone Encounter (Signed)
Called and spoke to patient, opted for 05/07 surgery date.

## 2023-03-09 NOTE — BH Specialist Note (Unsigned)
Integrated Behavioral Health via Telemedicine Visit  03/10/2023 Shirley Terrell [id number]  Number of Integrated Behavioral Health Clinician visits: 1- Initial Visit  Session Start time: 1453   Session End time: 1529  Total time in minutes: 36   Referring Provider: Tinnie Gens, MD Patient/Family location: Home [facility] Provider location: [facility] at [facility]  All persons participating in visit: [name] and [facility]   Types of Service: Individual psychotherapy and Video visit  I connected with Shirley Terrell and/or Shirley Terrell  n/a  via  Telephone or Video Enabled Telemedicine Application  (Video is Caregility application) and verified that I am speaking with the correct person using two identifiers. Discussed confidentiality: Yes   I discussed the limitations of telemedicine and the availability of in person appointments.  Discussed there is a possibility of technology failure and discussed alternative modes of communication if that failure occurs.  I discussed that engaging in this telemedicine visit, they consent to the provision of behavioral healthcare and the services will be billed under their insurance.  Patient and/or legal guardian expressed understanding and consented to Telemedicine visit: Yes   Presenting Concerns: Patient and/or family reports the following symptoms/concerns: Worry, lack of quality sleep, irritability, fatigue, difficulty accepting help from others, lack of routine since out of work since pregnancy. Pt open to implementing self-coping strategies.  Duration of problem: Postpartum increase; Severity of problem: moderate  Patient and/or Family's Strengths/Protective Factors: Social connections, Concrete supports in place (healthy food, safe environments, etc.), and Sense of purpose  Goals Addressed: Patient will:  Reduce symptoms of: anxiety, depression, and stress   Increase  knowledge and/or ability of: self-management skills and stress reduction   Demonstrate ability to: Increase healthy adjustment to current life circumstances and Increase adequate support systems for patient/family  Progress towards Goals: Ongoing  Interventions: Interventions utilized:  Solution-Focused Strategies, Psychoeducation and/or Health Education, and Link to [employer] Standardized Assessments completed: GAD-7 and PHQ 9  Patient and/or Family Response: Patient agrees with treatment plan.   Assessment: Patient currently experiencing Adjustment disorder with mixed anxious and depressed mood.   Patient may benefit from psychoeducation and brief therapeutic interventions regarding coping with symptoms of depression, anxiety, life stress .  Plan: Follow up with behavioral health clinician on : Two weeks Behavioral recommendations:  -Continue prioritizing healthy self-care (regular meals, adequate rest; allowing practical help from supportive friends and family) until at least postpartum medical appointment -Consider new mom support group as needed at either www.postpartum.net or www.conehealthybaby.com  -Begin Worry Time strategy, as discussed. Start by setting up start and end time reminders on phone today; continue daily for two weeks. -Read through resources on After Visit Summary; use as needed  Referral(s): Integrated Art gallery manager (In Clinic) and [employer] Resources:  Childcare; New parent support  I discussed the assessment and treatment plan with the patient and/or parent/guardian. They were provided an opportunity to ask questions and all were answered. They agreed with the plan and demonstrated an understanding of the instructions.   They were advised to call back or seek an in-person evaluation if the symptoms worsen or if the condition fails to improve as anticipated.  [name], LCSW       02/26/2023   11:24 AM 01/16/2023    5:00 AM   Edinburgh Postnatal Depression Scale Screening Tool  I have been able to laugh and see the funny side of things. 1 1  I have  looked forward with enjoyment to things. 0 2  I have blamed myself unnecessarily when things went wrong. 2 1  I have been anxious or worried for no good reason. 2 0  I have felt scared or panicky for no good reason. 0 0  Things have been getting on top of me. 3 2  I have been so unhappy that I have had difficulty sleeping. 0 1  I have felt sad or miserable. 1 0  I have been so unhappy that I have been crying. 0 0  The thought of harming myself has occurred to me. 0 0  Edinburgh Postnatal Depression Scale Total 9 7       03/10/2023    2:56 PM 08/18/2022   10:11 AM 07/28/2022   10:04 AM 02/17/2022    3:05 PM 12/25/2021    4:50 PM  Depression screen PHQ 2/9  Decreased Interest 0 1  Down, Depressed, Hopeless 0 1  PHQ - 2 Score 0 2  Altered sleeping Tired, decreased energy Change in appetite 0 Feeling bad or failure about yourself  Trouble concentrating 0 0 0  0  Moving slowly or fidgety/restless 0 0 0  0  Suicidal thoughts 0 0 0  0  PHQ-9 Score 03/10/2023    3:00 PM 08/18/2022   10:12 AM 07/28/2022   10:05 AM 12/25/2021    4:50 PM  GAD 7 : Generalized Anxiety Score  Nervous, Anxious, on Edge 1 0 2 2  Control/stop worrying 1 0 2 0  Worry too much - different things Trouble relaxing Restless 0 0 0 0  Easily annoyed or irritable Afraid - awful might happen 1 0 0 0  Total GAD 7 Score 9

## 2023-03-10 ENCOUNTER — Ambulatory Visit (INDEPENDENT_AMBULATORY_CARE_PROVIDER_SITE_OTHER): Payer: Medicaid Other | Admitting: Clinical

## 2023-03-10 DIAGNOSIS — F4323 Adjustment disorder with mixed anxiety and depressed mood: Secondary | ICD-10-CM

## 2023-03-10 NOTE — Patient Instructions (Signed)
[facility] [address] [place], [place] [address] [phone] (main office) [phone] (Lankford Gutzmer's office)  New Parent Support Groups www.postpartum.net www.conehealthybaby.com  [place] [employer]  (Childcare options, Early childcare development, etc.) www.[place]childdev.org  [facility] Search Engine  https://ncchildcare.ncdhhs.gov/childcaresearch  

## 2023-03-19 NOTE — BH Specialist Note (Deleted)
Integrated Behavioral Health via Telemedicine Visit  03/19/2023 Shirley Terrell [id number]  Number of Integrated Behavioral Health Clinician visits: 1- Initial Visit  Session Start time: 1453   Session End time: 1529  Total time in minutes: 36   Referring Provider: Tinnie Gens, MD Patient/Family location: Home*** [facility] Provider location: [facility]  All persons participating in visit: [name] and [facility] ***  Types of Service: {CHL AMB TYPE OF SERVICE:[phone]}  I connected with [name] and/or [name]'s {family members:20773} via  Telephone or Video Enabled Telemedicine Application  (Video is Caregility application) and verified that I am speaking with the correct person using two identifiers. Discussed confidentiality: Yes   I discussed the limitations of telemedicine and the availability of in person appointments.  Discussed there is a possibility of technology failure and discussed alternative modes of communication if that failure occurs.  I discussed that engaging in this telemedicine visit, they consent to the provision of behavioral healthcare and the services will be billed under their insurance.  Patient and/or legal guardian expressed understanding and consented to Telemedicine visit: Yes   Presenting Concerns: Patient and/or family reports the following symptoms/concerns: *** Duration of problem: ***; Severity of problem: {Mild/Moderate/Severe:20260}  Patient and/or Family's Strengths/Protective Factors: {CHL AMB BH PROTECTIVE FACTORS:[phone]}  Goals Addressed: Patient will:  Reduce symptoms of: {IBH Symptoms:21014056}   Increase knowledge and/or ability of: {IBH Patient Tools:21014057}   Demonstrate ability to: {IBH Goals:21014053}  Progress towards Goals: Ongoing  Interventions: Interventions utilized:  {IBH Interventions:21014054} Standardized  Assessments completed: {IBH Screening Tools:21014051}  Patient and/or Family Response: Patient agrees with treatment plan. ***  Assessment: Patient currently experiencing ***.   Patient may benefit from ***.  Plan: Follow up with behavioral health clinician on : *** Behavioral recommendations:  -*** -*** Referral(s): {IBH Referrals:21014055}  I discussed the assessment and treatment plan with the patient and/or parent/guardian. They were provided an opportunity to ask questions and all were answered. They agreed with the plan and demonstrated an understanding of the instructions.   They were advised to call back or seek an in-person evaluation if the symptoms worsen or if the condition fails to improve as anticipated.  [name], LCSW     03/10/2023    2:56 PM 08/18/2022   10:11 AM 07/28/2022   10:04 AM 02/17/2022    3:05 PM 12/25/2021    4:50 PM  Depression screen PHQ 2/9  Decreased Interest 3 1 1  0 1  Down, Depressed, Hopeless 1 1 1  0 1  PHQ - 2 Score 4 2 2  0 2  Altered sleeping 3 2 2  2   Tired, decreased energy 2 2 2  2   Change in appetite 0 2 1  2   Feeling bad or failure about yourself  2 2 1  2   Trouble concentrating 0 0 0  0  Moving slowly or fidgety/restless 0 0 0  0  Suicidal thoughts 0 0 0  0  PHQ-9 Score 11 10 8  10       03/10/2023    3:00 PM 08/18/2022   10:12 AM 07/28/2022   10:05 AM 12/25/2021    4:50 PM  GAD 7 : Generalized Anxiety Score  Nervous, Anxious, on Edge 1 0 2 2  Control/stop worrying 1 0 2 0  Worry too much - different things 3 2 2 3   Trouble relaxing 2 2 1 2   Restless 0 0 0  0  Easily annoyed or irritable 3 1 2 2   Afraid - awful might happen 1 0 0 0  Total GAD 7 Score 11 5 9  9

## 2023-03-20 ENCOUNTER — Encounter: Payer: Self-pay | Admitting: Family Medicine

## 2023-03-23 ENCOUNTER — Encounter (HOSPITAL_COMMUNITY): Payer: Self-pay | Admitting: Family Medicine

## 2023-03-23 NOTE — Anesthesia Preprocedure Evaluation (Signed)
Anesthesia Evaluation  Patient identified by MRN, date of birth, ID band Patient awake    Reviewed: Allergy & Precautions, NPO status , Patient's Chart, lab work & pertinent test results, reviewed documented beta blocker date and time   History of Anesthesia Complications Negative for: history of anesthetic complications  Airway Mallampati: II  TM Distance: >3 FB Neck ROM: Full    Dental  (+) Dental Advisory Given   Pulmonary asthma , sleep apnea and Continuous Positive Airway Pressure Ventilation , COPD,  COPD inhaler, Current SmokerPatient did not abstain from smoking.   breath sounds clear to auscultation       Cardiovascular hypertension, Pt. on medications and Pt. on home beta blockers  Rhythm:Regular Rate:Normal     Neuro/Psych negative neurological ROS     GI/Hepatic negative GI ROS, Neg liver ROS,,,  Endo/Other  BMI 36  Renal/GU negative Renal ROS     Musculoskeletal   Abdominal  (+) + obese  Peds  Hematology negative hematology ROS (+)   Anesthesia Other Findings   Reproductive/Obstetrics (+) Breast feeding  (pt prefers to pump and dump while taking pain meds)                              Anesthesia Physical Anesthesia Plan  ASA: 3  Anesthesia Plan: General   Post-op Pain Management: Tylenol PO (pre-op)*   Induction: Intravenous  PONV Risk Score and Plan: 2 and Ondansetron and Dexamethasone  Airway Management Planned: Oral ETT  Additional Equipment: None  Intra-op Plan:   Post-operative Plan: Extubation in OR  Informed Consent: I have reviewed the patients History and Physical, chart, labs and discussed the procedure including the risks, benefits and alternatives for the proposed anesthesia with the patient or authorized representative who has indicated his/her understanding and acceptance.     Dental advisory given  Plan Discussed with: CRNA and  Surgeon  Anesthesia Plan Comments:         Anesthesia Quick Evaluation

## 2023-03-23 NOTE — Progress Notes (Signed)
Shirley Terrell denies chest pain or shortness of breath.  .Patient denies having any s/s of Covid in her household, also denies any known exposure to Covid.   Shirley Terrell denies any s/s of upper or lower respiratory infection in the past 8 weeks.   Shirley. Terrell PCP is with [facility].  Patient sees a cardiologist  Dr. Anne Fu for HTN and high risk pregnancy. Shirley Terrell reports that CBG at homes is in the average of 130/60.-63339.9

## 2023-03-24 ENCOUNTER — Other Ambulatory Visit: Payer: Self-pay

## 2023-03-24 ENCOUNTER — Ambulatory Visit (HOSPITAL_COMMUNITY): Payer: Medicaid Other | Admitting: Certified Registered Nurse Anesthetist

## 2023-03-24 ENCOUNTER — Ambulatory Visit (HOSPITAL_COMMUNITY)
Admission: RE | Admit: 2023-03-24 | Discharge: 2023-03-24 | Disposition: A | Discharge status: Home / Self Care | Payer: Medicaid Other | Attending: Family Medicine | Admitting: Family Medicine

## 2023-03-24 ENCOUNTER — Encounter (HOSPITAL_COMMUNITY): Payer: Self-pay | Admitting: Family Medicine

## 2023-03-24 ENCOUNTER — Encounter (HOSPITAL_COMMUNITY)
Admission: RE | Disposition: A | Discharge status: Home / Self Care | Payer: Self-pay | Source: Home / Self Care | Attending: Family Medicine

## 2023-03-24 ENCOUNTER — Ambulatory Visit (HOSPITAL_BASED_OUTPATIENT_CLINIC_OR_DEPARTMENT_OTHER): Payer: Medicaid Other | Admitting: Certified Registered Nurse Anesthetist

## 2023-03-24 DIAGNOSIS — J449 Chronic obstructive pulmonary disease, unspecified: Secondary | ICD-10-CM | POA: Insufficient documentation

## 2023-03-24 DIAGNOSIS — G4730 Sleep apnea, unspecified: Secondary | ICD-10-CM | POA: Insufficient documentation

## 2023-03-24 DIAGNOSIS — E669 Obesity, unspecified: Secondary | ICD-10-CM | POA: Diagnosis not present

## 2023-03-24 DIAGNOSIS — F17210 Nicotine dependence, cigarettes, uncomplicated: Secondary | ICD-10-CM | POA: Insufficient documentation

## 2023-03-24 DIAGNOSIS — Z9989 Dependence on other enabling machines and devices: Secondary | ICD-10-CM

## 2023-03-24 DIAGNOSIS — I10 Essential (primary) hypertension: Secondary | ICD-10-CM | POA: Insufficient documentation

## 2023-03-24 DIAGNOSIS — Z302 Encounter for sterilization: Secondary | ICD-10-CM

## 2023-03-24 DIAGNOSIS — Z833 Family history of diabetes mellitus: Secondary | ICD-10-CM | POA: Diagnosis not present

## 2023-03-24 DIAGNOSIS — Z6836 Body mass index (BMI) 36.0-36.9, adult: Secondary | ICD-10-CM | POA: Insufficient documentation

## 2023-03-24 DIAGNOSIS — G4733 Obstructive sleep apnea (adult) (pediatric): Secondary | ICD-10-CM

## 2023-03-24 HISTORY — PX: LAPAROSCOPIC BILATERAL SALPINGECTOMY: SHX5889

## 2023-03-24 LAB — POCT I-STAT, CHEM 8
BUN: 14 mg/dL (ref 6–20)
BUN: 10 mg/dL (ref 6–20)
Calcium, Ion: 1.09 mmol/L — ABNORMAL LOW (ref 1.15–1.40)
Calcium, Ion: 1.18 mmol/L (ref 1.15–1.40)
Chloride: 108 mmol/L (ref 98–111)
Chloride: 106 mmol/L (ref 98–111)
Creatinine, Ser: 0.5 mg/dL (ref 0.44–1.00)
Creatinine, Ser: 0.6 mg/dL (ref 0.44–1.00)
Glucose, Bld: 114 mg/dL — ABNORMAL HIGH (ref 70–99)
Glucose, Bld: 111 mg/dL — ABNORMAL HIGH (ref 70–99)
HCT: 36 % (ref 36.0–46.0)
HCT: 35 % — ABNORMAL LOW (ref 36.0–46.0)
Hemoglobin: 12.2 g/dL (ref 12.0–15.0)
Hemoglobin: 11.9 g/dL — ABNORMAL LOW (ref 12.0–15.0)
Potassium: 6.8 mmol/L (ref 3.5–5.1)
Potassium: 3.3 mmol/L — ABNORMAL LOW (ref 3.5–5.1)
Sodium: 138 mmol/L (ref 135–145)
Sodium: 141 mmol/L (ref 135–145)
TCO2: 25 mmol/L (ref 22–32)
TCO2: 24 mmol/L (ref 22–32)

## 2023-03-24 LAB — CBC
HCT: 35.6 % — ABNORMAL LOW (ref 36.0–46.0)
Hemoglobin: 11.7 g/dL — ABNORMAL LOW (ref 12.0–15.0)
MCH: 29.8 pg (ref 26.0–34.0)
MCHC: 32.9 g/dL (ref 30.0–36.0)
MCV: 90.8 fL (ref 80.0–100.0)
Platelets: 217 10*3/uL (ref 150–400)
RBC: 3.92 MIL/uL (ref 3.87–5.11)
RDW: 12.6 % (ref 11.5–15.5)
WBC: 7.7 10*3/uL (ref 4.0–10.5)
nRBC: 0 % (ref 0.0–0.2)

## 2023-03-24 LAB — POCT PREGNANCY, URINE: Preg Test, Ur: NEGATIVE

## 2023-03-24 SURGERY — SALPINGECTOMY, BILATERAL, LAPAROSCOPIC
Anesthesia: General

## 2023-03-24 MED ORDER — OXYCODONE HCL 5 MG PO TABS
5.0000 mg | ORAL_TABLET | Freq: Four times a day (QID) | ORAL | 0 refills | Status: AC | PRN
Start: 2023-03-24 — End: ?

## 2023-03-24 MED ORDER — CHLORHEXIDINE GLUCONATE 0.12 % MT SOLN
OROMUCOSAL | Status: AC
  Administered 2023-03-24: 15 mL via OROMUCOSAL
  Filled 2023-03-24: qty 15

## 2023-03-24 MED ORDER — HYDROMORPHONE HCL 1 MG/ML IJ SOLN
INTRAMUSCULAR | Status: AC
  Filled 2023-03-24: qty 1

## 2023-03-24 MED ORDER — FENTANYL CITRATE (PF) 250 MCG/5ML IJ SOLN
INTRAMUSCULAR | Status: DC | PRN
  Administered 2023-03-24: 100 ug via INTRAVENOUS

## 2023-03-24 MED ORDER — PROPOFOL 10 MG/ML IV BOLUS
INTRAVENOUS | Status: DC | PRN
  Administered 2023-03-24: 200 mg via INTRAVENOUS

## 2023-03-24 MED ORDER — LACTATED RINGERS IV SOLN: INTRAVENOUS | Status: DC

## 2023-03-24 MED ORDER — KETOROLAC TROMETHAMINE 30 MG/ML IJ SOLN
INTRAMUSCULAR | Status: DC | PRN
  Administered 2023-03-24: 30 mg via INTRAVENOUS

## 2023-03-24 MED ORDER — SUGAMMADEX SODIUM 200 MG/2ML IV SOLN
INTRAVENOUS | Status: DC | PRN
  Administered 2023-03-24: 200 mg via INTRAVENOUS

## 2023-03-24 MED ORDER — MIDAZOLAM HCL 2 MG/2ML IJ SOLN
INTRAMUSCULAR | Status: AC
  Filled 2023-03-24: qty 2

## 2023-03-24 MED ORDER — POVIDONE-IODINE 10 % EX SWAB
2.0000 | Freq: Once | CUTANEOUS | Status: AC
  Administered 2023-03-24: 2 via TOPICAL

## 2023-03-24 MED ORDER — ACETAMINOPHEN 500 MG PO TABS
1000.0000 mg | ORAL_TABLET | Freq: Once | ORAL | Status: AC
  Administered 2023-03-24: 1000 mg via ORAL
  Filled 2023-03-24: qty 2

## 2023-03-24 MED ORDER — BUPIVACAINE HCL (PF) 0.25 % IJ SOLN
INTRAMUSCULAR | Status: AC
  Filled 2023-03-24: qty 30

## 2023-03-24 MED ORDER — ORAL CARE MOUTH RINSE: 15.0000 mL | Freq: Once | OROMUCOSAL | Status: AC

## 2023-03-24 MED ORDER — ACETAMINOPHEN 500 MG PO TABS: 1000.0000 mg | ORAL_TABLET | ORAL | Status: DC

## 2023-03-24 MED ORDER — MIDAZOLAM HCL 2 MG/2ML IJ SOLN: 0.5000 mg | Freq: Once | INTRAMUSCULAR | Status: DC | PRN

## 2023-03-24 MED ORDER — HYDROMORPHONE HCL 1 MG/ML IJ SOLN
0.2500 mg | INTRAMUSCULAR | Status: DC | PRN
  Administered 2023-03-24 (×2): 0.25 mg via INTRAVENOUS
  Administered 2023-03-24: 0.5 mg via INTRAVENOUS

## 2023-03-24 MED ORDER — PHENYLEPHRINE 80 MCG/ML (10ML) SYRINGE FOR IV PUSH (FOR BLOOD PRESSURE SUPPORT)
PREFILLED_SYRINGE | INTRAVENOUS | Status: DC | PRN
  Administered 2023-03-24: 80 ug via INTRAVENOUS
  Administered 2023-03-24: 160 ug via INTRAVENOUS

## 2023-03-24 MED ORDER — BUPIVACAINE HCL (PF) 0.25 % IJ SOLN
INTRAMUSCULAR | Status: DC | PRN
  Administered 2023-03-24: 14 mL

## 2023-03-24 MED ORDER — PROPOFOL 10 MG/ML IV BOLUS
INTRAVENOUS | Status: AC
  Filled 2023-03-24: qty 20

## 2023-03-24 MED ORDER — LIDOCAINE 2% (20 MG/ML) 5 ML SYRINGE
INTRAMUSCULAR | Status: DC | PRN
  Administered 2023-03-24: 100 mg via INTRAVENOUS

## 2023-03-24 MED ORDER — OXYCODONE HCL 5 MG PO TABS: 5.0000 mg | ORAL_TABLET | Freq: Once | ORAL | Status: DC | PRN

## 2023-03-24 MED ORDER — CHLORHEXIDINE GLUCONATE 0.12 % MT SOLN: 15.0000 mL | Freq: Once | OROMUCOSAL | Status: AC

## 2023-03-24 MED ORDER — PROMETHAZINE HCL 25 MG/ML IJ SOLN: 6.2500 mg | INTRAMUSCULAR | Status: DC | PRN

## 2023-03-24 MED ORDER — KETOROLAC TROMETHAMINE 30 MG/ML IJ SOLN
INTRAMUSCULAR | Status: AC
  Filled 2023-03-24: qty 1

## 2023-03-24 MED ORDER — ROCURONIUM BROMIDE 10 MG/ML (PF) SYRINGE
PREFILLED_SYRINGE | INTRAVENOUS | Status: DC | PRN
  Administered 2023-03-24: 60 mg via INTRAVENOUS

## 2023-03-24 MED ORDER — OXYCODONE HCL 5 MG/5ML PO SOLN: 5.0000 mg | Freq: Once | ORAL | Status: DC | PRN

## 2023-03-24 MED ORDER — MEPERIDINE HCL 25 MG/ML IJ SOLN: 6.2500 mg | INTRAMUSCULAR | Status: DC | PRN

## 2023-03-24 MED ORDER — DEXAMETHASONE SODIUM PHOSPHATE 10 MG/ML IJ SOLN
INTRAMUSCULAR | Status: DC | PRN
  Administered 2023-03-24: 10 mg via INTRAVENOUS

## 2023-03-24 MED ORDER — GABAPENTIN 300 MG PO CAPS
300.0000 mg | ORAL_CAPSULE | ORAL | Status: AC
  Administered 2023-03-24: 300 mg via ORAL
  Filled 2023-03-24: qty 1

## 2023-03-24 MED ORDER — ONDANSETRON HCL 4 MG/2ML IJ SOLN
INTRAMUSCULAR | Status: DC | PRN
  Administered 2023-03-24: 4 mg via INTRAVENOUS

## 2023-03-24 MED ORDER — FENTANYL CITRATE (PF) 250 MCG/5ML IJ SOLN
INTRAMUSCULAR | Status: AC
  Filled 2023-03-24: qty 5

## 2023-03-24 SURGICAL SUPPLY — 20 items
ADH SKN CLS APL DERMABOND .7 (GAUZE/BANDAGES/DRESSINGS) ×1
DERMABOND ADVANCED .7 DNX12 (GAUZE/BANDAGES/DRESSINGS) ×1 IMPLANT
DURAPREP 26ML APPLICATOR (WOUND CARE) ×1 IMPLANT
GLOVE BIOGEL PI IND STRL 7.0 (GLOVE) ×4 IMPLANT
GLOVE ECLIPSE 7.0 STRL STRAW (GLOVE) ×1 IMPLANT
GLOVE SURG ENC MOIS LTX SZ7 (GLOVE) ×1 IMPLANT
GOWN STRL REUS W/ TWL LRG LVL3 (GOWN DISPOSABLE) ×3 IMPLANT
GOWN STRL REUS W/TWL LRG LVL3 (GOWN DISPOSABLE) ×3
KIT PINK PAD W/HEAD ARE REST (MISCELLANEOUS) ×1
KIT PINK PAD W/HEAD ARM REST (MISCELLANEOUS) ×1 IMPLANT
KIT TURNOVER KIT B (KITS) ×1 IMPLANT
PACK LAPAROSCOPY BASIN (CUSTOM PROCEDURE TRAY) ×1 IMPLANT
SET TUBE SMOKE EVAC HIGH FLOW (TUBING) ×1 IMPLANT
SHEARS HARMONIC ACE PLUS 36CM (ENDOMECHANICALS) IMPLANT
SLEEVE Z-THREAD 5X100MM (TROCAR) ×1 IMPLANT
SUT VICRYL 4-0 PS2 18IN ABS (SUTURE) ×1 IMPLANT
TOWEL GREEN STERILE FF (TOWEL DISPOSABLE) ×2 IMPLANT
TRAY FOLEY W/BAG SLVR 14FR (SET/KITS/TRAYS/PACK) ×1 IMPLANT
TROCAR XCEL NON-BLD 5MMX100MML (ENDOMECHANICALS) ×1 IMPLANT
WARMER LAPAROSCOPE (MISCELLANEOUS) ×1 IMPLANT

## 2023-03-24 NOTE — Transfer of Care (Signed)
Immediate Anesthesia Transfer of Care Note  Patient: Shirley Terrell  Procedure(s) Performed: LAPAROSCOPIC BILATERAL SALPINGECTOMY  Patient Location: PACU  Anesthesia Type:General  Level of Consciousness: awake and alert   Airway & Oxygen Therapy: Patient Spontanous Breathing and Patient connected to nasal cannula oxygen  Post-op Assessment: Report given to RN and Post -op Vital signs reviewed and stable  Post vital signs: Reviewed and stable  Last Vitals:  Vitals Value Taken Time  BP 110/82 03/24/23 0905  Temp    Pulse 80 03/24/23 0909  Resp 19 03/24/23 0909  SpO2 100 % 03/24/23 0909  Vitals shown include unvalidated device data.  Last Pain:  Vitals:   03/24/23 0703  TempSrc:   PainSc: 0-No pain         Complications: No notable events documented.

## 2023-03-24 NOTE — H&P (Signed)
Shirley Terrell is an 37 y.o. [phone] female.   Chief Complaint: desires sterilization HPI: Patient desires permanent sterilization  Past Medical History:  Diagnosis Date   Anemia    takes iron supplement   Asthma    prn inhaler   Hypertension    states BP normalized while pregnant and since delivery; is currently not on med.   Lisfranc dislocation 11/30/2016   left   Pregnancy induced hypertension    with 2nd preg   Sleep apnea    uses cpap    Past Surgical History:  Procedure Laterality Date   CHOLECYSTECTOMY  04/13/2012   Procedure: LAPAROSCOPIC CHOLECYSTECTOMY WITH INTRAOPERATIVE CHOLANGIOGRAM;  Surgeon: Adolph Pollack, MD;  Location: [facility] OR;  Service: General;  Laterality: N/A;  laparoscopic cholecystectomy with IOC   INCISIONAL HERNIA REPAIR  08/13/2018   LAPROSCOPIC   INCISIONAL HERNIA REPAIR N/A 08/13/2018   Procedure: LAPAROSCOPIC INCISIONAL HERNIA REPAIR;  Surgeon: Berna Bue, MD;  Location: [facility] OR;  Service: General;  Laterality: N/A;   INSERTION OF MESH N/A 08/13/2018   Procedure: INSERTION OF MESH;  Surgeon: Berna Bue, MD;  Location: [facility] OR;  Service: General;  Laterality: N/A;   OPEN REDUCTION INTERNAL FIXATION (ORIF) FOOT LISFRANC FRACTURE Left 12/18/2016   Procedure: OPEN REDUCTION INTERNAL FIXATION (ORIF) FOOT LISFRANC FRACTURE;  Surgeon: Toni Arthurs, MD;  Location: [facility];  Service: Orthopedics;  Laterality: Left;    Family History  Problem Relation Age of Onset   Hypertension Mother    Stroke Mother    Renal Disease Mother        due to blood pressure   Heart disease Father        MI, no stents   Depression Father    Hypertension Father    Stroke Father    Healthy Sister    Healthy Sister    Healthy Sister    Healthy Sister    Asthma Daughter    Hypertension Maternal Grandmother    Asthma Paternal Grandmother    Cancer Paternal Grandmother        breast   Diabetes Paternal Grandmother    Cancer Paternal  Grandfather    Diabetes Other    Social History:  reports that she has been smoking cigarettes. She has never used smokeless tobacco. She reports that she does not currently use alcohol. She reports that she does not use drugs.  Allergies:  Allergies  Allergen Reactions   Pork-Derived Products     Pt unable to eat     Medications Prior to Admission  Medication Sig Dispense Refill   amLODipine (NORVASC) 5 MG tablet Take 2 tablets (10 mg total) by mouth daily. 90 tablet 3   carvedilol (COREG) 12.5 MG tablet Take 1 tablet (12.5 mg total) by mouth daily. 30 tablet 0   hydrochlorothiazide (HYDRODIURIL) 25 MG tablet TAKE 1 TABLET (25 MG TOTAL) BY MOUTH DAILY. 90 tablet 1   Prenatal Vit-Fe Fumarate-FA (PRENATAL VITAMINS PO) Take 1 tablet by mouth daily.     acetaminophen (TYLENOL) 325 MG tablet Take 2 tablets (650 mg total) by mouth every 4 (four) hours as needed (for pain scale < 4). (Patient not taking: Reported on 03/20/2023) 30 tablet 0   albuterol (VENTOLIN HFA) 108 (90 Base) MCG/ACT inhaler Inhale 2 puffs into the lungs every 6 (six) hours as needed for wheezing. 18 g 0   Blood Pressure Monitoring (BLOOD PRESSURE KIT) DEVI 1 Device by Does not apply route as needed. 1  each 0   ibuprofen (ADVIL) 600 MG tablet Take 1 tablet (600 mg total) by mouth every 6 (six) hours. (Patient not taking: Reported on 03/20/2023) 30 tablet 0    A comprehensive review of systems was negative.  Blood pressure 133/89, pulse 90, temperature 97.7 F (36.5 C), temperature source Oral, resp. rate 18, height 5\' 3"  (1.6 m), weight 93 kg, SpO2 96 %, currently breastfeeding. General appearance: alert, cooperative, and appears stated age Head: Normocephalic, without obvious abnormality, atraumatic Neck: supple, symmetrical, trachea midline Lungs:  normal effort Heart: regular rate and rhythm Abdomen: soft, non-tender; bowel sounds normal; no masses,  no organomegaly Extremities: extremities normal, atraumatic, no  cyanosis or edema   Results for orders placed or performed during the hospital encounter of 03/24/23 (from the past 24 hour(s))  Pregnancy, urine POC     Status: None   Collection Time: 03/24/23  6:44 AM  Result Value Ref Range   Preg Test, Ur NEGATIVE NEGATIVE  I-STAT, chem 8     Status: Abnormal   Collection Time: 03/24/23  7:22 AM  Result Value Ref Range   Sodium 138 135 - 145 mmol/L   Potassium 6.8 (HH) 3.5 - 5.1 mmol/L   Chloride 108 98 - 111 mmol/L   BUN 14 6 - 20 mg/dL   Creatinine, Ser 4.09 0.44 - 1.00 mg/dL   Glucose, Bld 811 (H) 70 - 99 mg/dL   Calcium, Ion 9.14 (L) 1.15 - 1.40 mmol/L   TCO2 25 22 - 32 mmol/L   Hemoglobin 12.2 12.0 - 15.0 g/dL   HCT 78.2 95.6 - 21.3 %   Comment NOTIFIED PHYSICIAN   I-STAT, chem 8     Status: Abnormal   Collection Time: 03/24/23  7:40 AM  Result Value Ref Range   Sodium 141 135 - 145 mmol/L   Potassium 3.3 (L) 3.5 - 5.1 mmol/L   Chloride 106 98 - 111 mmol/L   BUN 10 6 - 20 mg/dL   Creatinine, Ser 0.86 0.44 - 1.00 mg/dL   Glucose, Bld 578 (H) 70 - 99 mg/dL   Calcium, Ion 4.69 6.29 - 1.40 mmol/L   TCO2 24 22 - 32 mmol/L   Hemoglobin 11.9 (L) 12.0 - 15.0 g/dL   HCT 52.8 (L) 41.3 - 24.4 %   No results found.  Assessment/Plan Active Problems:   Encounter for sterilization  For laparoscopic bilateral salpingectomy. Patient counseled, r.e. Risks benefits of BTL, including permanency of procedure, discussed benefits of salpingectomy including almost no failure and prevention of ovarian cancer.   Patient verbalized understanding and desires to proceed  Risks include but are not limited to bleeding, infection, injury to surrounding structures, including bowel, bladder and ureters, blood clots, and death.  Likelihood of success is high.    Reva Bores 03/24/2023, 7:51 AM

## 2023-03-24 NOTE — Anesthesia Postprocedure Evaluation (Signed)
Anesthesia Post Note  Patient: Shirley Terrell  Procedure(s) Performed: LAPAROSCOPIC BILATERAL SALPINGECTOMY     Patient location during evaluation: PACU Anesthesia Type: General Level of consciousness: awake and alert, patient cooperative and oriented Pain management: pain level controlled Vital Signs Assessment: post-procedure vital signs reviewed and stable Respiratory status: spontaneous breathing, nonlabored ventilation and respiratory function stable Cardiovascular status: blood pressure returned to baseline and stable Postop Assessment: no apparent nausea or vomiting and able to ambulate Anesthetic complications: no   No notable events documented.  Last Vitals:  Vitals:   03/24/23 0930 03/24/23 0945  BP: (!) 126/90 114/82  Pulse: 77 72  Resp: 10 15  Temp:  (!) 36.4 C  SpO2: 99% 96%    Last Pain:  Vitals:   03/24/23 0930  TempSrc:   PainSc: 7                  [name],E. Tahara Ruffini

## 2023-03-24 NOTE — Op Note (Signed)
PROCEDURE DATE: 03/24/2023  PREOPERATIVE DIAGNOSES: Undesired fertility  POSTOPERATIVE DIAGNOSES: The same   PROCEDURE: Laparoscopic bilateral salpingctomy   SURGEON: Dr. Reva Bores   ASSISTANT: Karmen Stabs, RNFA  ANESTHESIOLOGIST: Jairo Ben, MD MD - GETT  INDICATIONS: 37 y.o. [phone] who desired permanent sterilization.  FINDINGS: Normal appearing uterus, tubes and ovaries   ESTIMATED BLOOD LOSS: 25 ml   SPECIMENS: Bilateral fallopian tubes to pathology  COMPLICATIONS: None immediately known   PROCEDURE IN DETAIL: The patient had sequential compression devices applied to her lower extremities while in the preoperative area. She was then taken to the operating room where general anesthesia was administered and was found to be adequate. She was placed in the dorsal lithotomy position, and was prepped and draped in a sterile manner. A foley catheter was inserted into her bladder. A speculum was placed inside the vagina, and the cervix grasped with an single tooth tenaculum. A Hulka tenaculum was placed through the cervix for uterine manipulation. Attention was then turned to the patient's abdomen where a 5-mm skin incision was made in the LUQ under direct visualization due to prior hernia repair with mesh. Intraperitoneal placement was confirmed and insufflation done. A survey of the patient's pelvis and abdomen revealed the findings above. Bilateral 5-mm lower quadrant ports were then placed under direct visualization. On the right side, the tube was separated from the mesosalpinx with the Harmonic device.  On the left side, the tube was separated from the mesosalpinx with the Harmonic device.  The specimen was then removed from the abdomen through the RLQ port, under direct visualization. The operative site was surveyed, and found to be hemostatic. No intraoperative injury to other surrounding organs was noted. The abdomen was desufflated and all instruments were then removed from  the patient's abdomen. All skin incisions were closed with 3-0 Vicryl subcuticular stitches/Dermabond.   Reva Bores MD 03/24/2023 8:44 AM

## 2023-03-24 NOTE — Anesthesia Procedure Notes (Signed)
Procedure Name: Intubation Date/Time: 03/24/2023 8:17 AM  Performed by: Alwyn Ren, CRNAPre-anesthesia Checklist: Patient identified, Emergency Drugs available, Suction available and Patient being monitored Patient Re-evaluated:Patient Re-evaluated prior to induction Oxygen Delivery Method: Circle system utilized Preoxygenation: Pre-oxygenation with 100% oxygen Induction Type: IV induction Ventilation: Mask ventilation without difficulty Laryngoscope Size: Mac and 3 Grade View: Grade I Tube type: Oral Tube size: 7.0 mm Number of attempts: 1 Airway Equipment and Method: Stylet and Oral airway Placement Confirmation: ETT inserted through vocal cords under direct vision, positive ETCO2 and breath sounds checked- equal and bilateral Secured at: 22 cm Tube secured with: Tape Dental Injury: Teeth and Oropharynx as per pre-operative assessment

## 2023-03-24 NOTE — Progress Notes (Signed)
Potassium 6.8 on istat from IV stick. Per Dr Jean Rosenthal, need to re-draw and re-run. Istat re-check from venipuncture 3.3.

## 2023-03-25 ENCOUNTER — Encounter (HOSPITAL_COMMUNITY): Payer: Self-pay | Admitting: Family Medicine

## 2023-03-25 ENCOUNTER — Other Ambulatory Visit: Payer: Self-pay | Admitting: Family Medicine

## 2023-03-25 LAB — SURGICAL PATHOLOGY

## 2023-03-26 ENCOUNTER — Encounter: Payer: Self-pay | Admitting: *Deleted

## 2023-04-06 NOTE — BH Specialist Note (Addendum)
Integrated Behavioral Health via Telemedicine Visit  04/23/2023 TRANISHA HENNES [id number]  Number of Integrated Behavioral Health Clinician visits: 2- Second Visit  Session Start time: 1517   Session End time: 1552  Total time in minutes: 35   Referring Provider: Tinnie Gens, MD Patient/Family location: Home [facility] Provider location: [facility] for [facility]  All persons participating in visit: [name] and [facility] Lissandro Dilorenzo   Types of Service: Individual psychotherapy and Video visit  I connected with [name] and/or Joyce Copa Printz's  n/a  via  Telephone or Video Enabled Telemedicine Application  (Video is Caregility application) and verified that I am speaking with the correct person using two identifiers. Discussed confidentiality: Yes   I discussed the limitations of telemedicine and the availability of in person appointments.  Discussed there is a possibility of technology failure and discussed alternative modes of communication if that failure occurs.  I discussed that engaging in this telemedicine visit, they consent to the provision of behavioral healthcare and the services will be billed under their insurance.  Patient and/or legal guardian expressed understanding and consented to Telemedicine visit: Yes   Presenting Concerns: Patient and/or family reports the following symptoms/concerns: Daily fatigue and worry; using daily self-coping strategy and recent self-advocacy with good outcome the past two weeks (bought car, got driver's license and registration complete, all clothes washed, all in less than 3 days) Duration of problem: Postpartum increase; Severity of problem: moderate  Patient and/or Family's Strengths/Protective Factors: Social connections, Concrete supports in place (healthy food, safe environments, etc.), and Sense of purpose  Goals Addressed: Patient will:  Reduce symptoms of: anxiety,  depression, and stress   Increase knowledge and/or ability of: self-management skills   Demonstrate ability to: Increase healthy adjustment to current life circumstances  Progress towards Goals: Ongoing  Interventions: Interventions utilized:  Supportive Reflection Standardized Assessments completed: GAD-7 and PHQ 9  Patient and/or Family Response: Patient agrees with treatment plan.   Assessment: Patient currently experiencing Adjustment disorder with mixed anxiety and depressed mood.   Patient may benefit from continued therapeutic interventions.  Plan: Follow up with behavioral health clinician on : Two weeks Behavioral recommendations:  -Continue daily healthy self-care, accepting help from others as offered; daily Worry Time strategy -Continue plan to begin taking regular walks with neighbor/friend on good-weather days  Referral(s): Integrated [employer] (In Clinic)  I discussed the assessment and treatment plan with the patient and/or parent/guardian. They were provided an opportunity to ask questions and all were answered. They agreed with the plan and demonstrated an understanding of the instructions.   They were advised to call back or seek an in-person evaluation if the symptoms worsen or if the condition fails to improve as anticipated.  [name], LCSW     04/09/2023    3:37 PM 03/10/2023    2:56 PM 08/18/2022   10:11 AM 07/28/2022   10:04 AM 02/17/2022    3:05 PM  Depression screen PHQ 2/9  Decreased Interest 1 3 1 1  0  Down, Depressed, Hopeless 1 1 1 1  0  PHQ - 2 Score 2 4 2 2  0  Altered sleeping 1 3 2 2    Tired, decreased energy 3 2 2 2    Change in appetite 0 0 2 1   Feeling bad or failure about yourself  1 2 2 1    Trouble concentrating 0 0 0 0   Moving slowly or fidgety/restless 0 0  0 0   Suicidal thoughts 0 0 0 0   PHQ-9 Score 7 11 10 8         04/09/2023    3:34 PM 03/10/2023    3:00 PM 08/18/2022   10:12 AM 07/28/2022   10:05  AM  GAD 7 : Generalized Anxiety Score  Nervous, Anxious, on Edge 1 1 0 2  Control/stop worrying 0 1 0 2  Worry too much - different things 3 3 2 2   Trouble relaxing 1 2 2 1   Restless 0 0 0 0  Easily annoyed or irritable 2 3 1 2   Afraid - awful might happen 0 1 0 0  Total GAD 7 Score 7 11 5  9

## 2023-04-09 ENCOUNTER — Ambulatory Visit (INDEPENDENT_AMBULATORY_CARE_PROVIDER_SITE_OTHER): Payer: Medicaid Other | Admitting: Clinical

## 2023-04-09 DIAGNOSIS — F4323 Adjustment disorder with mixed anxiety and depressed mood: Secondary | ICD-10-CM

## 2023-04-10 ENCOUNTER — Encounter: Payer: Medicaid Other | Admitting: Family Medicine

## 2023-04-10 NOTE — Progress Notes (Signed)
This encounter was created in error - please disregard.  Patient did not keep appointment today. She may call to reschedule.

## 2023-04-10 NOTE — BH Specialist Note (Signed)
Pt did not arrive to video visit and did not answer the phone; Left HIPPA-compliant message to call back Mehgan Santmyer from [facility] at  [phone] (Cantrell Larouche's office).  ?; left MyChart message for patient.  ? ?

## 2023-04-16 ENCOUNTER — Other Ambulatory Visit: Payer: Self-pay | Admitting: Family Medicine

## 2023-04-23 ENCOUNTER — Ambulatory Visit: Payer: Medicaid Other | Admitting: Clinical

## 2023-04-23 DIAGNOSIS — Z91199 Patient's noncompliance with other medical treatment and regimen due to unspecified reason: Secondary | ICD-10-CM

## 2023-04-28 NOTE — Progress Notes (Unsigned)
Cardio-Obstetrics Clinic  Follow-up Evaluation  Date:  04/30/2023   ID:  Shirley Terrell, DOB [birth year], MRN [id number]  PCP:  [place] Bing, MD   [facility] Providers Cardiologist:  Donato Schultz, MD  Electrophysiologist:  None     Referring MD: [place] Bing, MD   Chief Complaint: HTN, high risk pregnancy  History of Present Illness:    Shirley Terrell is a 37 y.o. female [G4P3013] who is being seen today for the evaluation of HTN, OSA and high risk pregnancy at the request of Seymour Bing, MD.   Was last seen in clinic on 07/11/22 for hypertension management. We continued her amlodipine 10mg  daily and changed her coreg to labetalol 100mg  BID.  Was hospitalized 12/16/22-12/21/22 for vaginal bleeding. Received BMZx2 doses for fetal lung maturity. Fortunately, she remained stable with no signs of fetal distress. She was deemed stable to discharge home.   Delivered at 38w in 01/2023. Was discharged on amlodipine 10mg  daily, coreg 12.5mg  BID and HCTZ 25mg  daily.  Today, the patient is doing okay. Feels tired with the newborn stage, but is otherwise doing okay. States she is going to the pharmacy and they are filling her lasix instead of her HCTZ and she has not been taking it. She has not been on HCTZ for 2 weeks. When she was taking the medication, the blood pressure is well controlled at 120s/80s. No chest pain, SOB, LE edema, orthopnea or PND. No HA or vision changes.    Prior CV Studies Reviewed: The following studies were reviewed today: No CV studies  Past Medical History:  Diagnosis Date   Anemia    takes iron supplement   Asthma    prn inhaler   Hypertension    states BP normalized while pregnant and since delivery; is currently not on med.   Lisfranc dislocation 11/30/2016   left   Pregnancy induced hypertension    with 2nd preg   Sleep apnea    uses cpap    Past Surgical History:  Procedure Laterality Date   CHOLECYSTECTOMY  04/13/2012    Procedure: LAPAROSCOPIC CHOLECYSTECTOMY WITH INTRAOPERATIVE CHOLANGIOGRAM;  Surgeon: Adolph Pollack, MD;  Location: [facility] OR;  Service: General;  Laterality: N/A;  laparoscopic cholecystectomy with IOC   INCISIONAL HERNIA REPAIR  08/13/2018   LAPROSCOPIC   INCISIONAL HERNIA REPAIR N/A 08/13/2018   Procedure: LAPAROSCOPIC INCISIONAL HERNIA REPAIR;  Surgeon: Berna Bue, MD;  Location: [facility] OR;  Service: General;  Laterality: N/A;   INSERTION OF MESH N/A 08/13/2018   Procedure: INSERTION OF MESH;  Surgeon: Berna Bue, MD;  Location: [facility] OR;  Service: General;  Laterality: N/A;   LAPAROSCOPIC BILATERAL SALPINGECTOMY N/A 03/24/2023   Procedure: LAPAROSCOPIC BILATERAL SALPINGECTOMY;  Surgeon: Reva Bores, MD;  Location: [facility] OR;  Service: Gynecology;  Laterality: N/A;   OPEN REDUCTION INTERNAL FIXATION (ORIF) FOOT LISFRANC FRACTURE Left 12/18/2016   Procedure: OPEN REDUCTION INTERNAL FIXATION (ORIF) FOOT LISFRANC FRACTURE;  Surgeon: Toni Arthurs, MD;  Location: [facility];  Service: Orthopedics;  Laterality: Left;      OB History     Gravida  4   Para  3   Term  3   Preterm      AB  1   Living  3      SAB  1   IAB      Ectopic      Multiple  0   Live Births  3  Current Medications: Current Meds  Medication Sig   albuterol (VENTOLIN HFA) 108 (90 Base) MCG/ACT inhaler Inhale 2 puffs into the lungs every 6 (six) hours as needed for wheezing.   amLODipine (NORVASC) 5 MG tablet Take 2 tablets (10 mg total) by mouth daily.   Blood Pressure Monitoring (BLOOD PRESSURE KIT) DEVI 1 Device by Does not apply route as needed.   carvedilol (COREG) 12.5 MG tablet Take 1 tablet (12.5 mg total) by mouth daily.   oxyCODONE (OXY IR/ROXICODONE) 5 MG immediate release tablet Take 1 tablet (5 mg total) by mouth every 6 (six) hours as needed for severe pain.   Prenatal Vit-Fe Fumarate-FA (PRENATAL VITAMINS PO) Take 1 tablet by mouth daily.    [DISCONTINUED] hydrochlorothiazide (HYDRODIURIL) 25 MG tablet TAKE 1 TABLET (25 MG TOTAL) BY MOUTH DAILY.     Allergies:   Pork-derived products   Social History   Socioeconomic History   Marital status: Single    Spouse name: Not on file   Number of children: Not on file   Years of education: Not on file   Highest education level: Some college, no degree  Occupational History   Not on file  Tobacco Use   Smoking status: Some Days    Packs/day: 0.00    Years: 3.00    Additional pack years: 0.00    Total pack years: 0.00    Types: Cigarettes   Smokeless tobacco: Never   Tobacco comments:    Smokes some days- stressed  Vaping Use   Vaping Use: Never used  Substance and Sexual Activity   Alcohol use: Not Currently    Comment: occasionally   Drug use: No   Sexual activity: Yes    Partners: Male    Birth control/protection: None  Other Topics Concern   Not on file  Social History Narrative   She is single with 2 daughters, age 43 yrs and 1 yr in 2019.    Social Determinants of Health   Financial Resource Strain: Medium Risk (11/28/2022)   Overall Financial Resource Strain (CARDIA)    Difficulty of Paying Living Expenses: Somewhat hard  Food Insecurity: No Food Insecurity (01/15/2023)   Hunger Vital Sign    Worried About Running Out of Food in the Last Year: Never true    Ran Out of Food in the Last Year: Never true  Recent Concern: Food Insecurity - Food Insecurity Present (11/28/2022)   Hunger Vital Sign    Worried About Running Out of Food in the Last Year: Sometimes true    Ran Out of Food in the Last Year: Sometimes true  Transportation Needs: No Transportation Needs (01/15/2023)   PRAPARE - Administrator, Civil Service (Medical): No    Lack of Transportation (Non-Medical): No  Physical Activity: Insufficiently Active (11/28/2022)   Exercise Vital Sign    Days of Exercise per Week: 3 days    Minutes of Exercise per Session: 20 min  Stress: Stress  Concern Present (11/28/2022)   [employer] of Occupational Health - Occupational Stress Questionnaire    Feeling of Stress : To some extent  Social Connections: Socially Isolated (11/28/2022)   Social Connection and Isolation Panel [NHANES]    Frequency of Communication with Friends and Family: More than three times a week    Frequency of Social Gatherings with Friends and Family: Never    Attends Religious Services: Never    Database administrator or Organizations: No    Attends Banker  Meetings: Not on file    Marital Status: Never married      Family History  Problem Relation Age of Onset   Hypertension Mother    Stroke Mother    Renal Disease Mother        due to blood pressure   Heart disease Father        MI, no stents   Depression Father    Hypertension Father    Stroke Father    Healthy Sister    Healthy Sister    Healthy Sister    Healthy Sister    Asthma Daughter    Hypertension Maternal Grandmother    Asthma Paternal Grandmother    Cancer Paternal Grandmother        breast   Diabetes Paternal Grandmother    Cancer Paternal Grandfather    Diabetes Other       ROS:   Please see the history of present illness.    All other systems reviewed and are negative.   Labs/EKG Reviewed:    EKG:   No new tracing today.  Recent Labs: 07/28/2022: TSH 2.030 11/07/2022: ALT 7 03/24/2023: BUN 10; Creatinine, Ser 0.60; Hemoglobin 11.9; Platelets 217; Potassium 3.3; Sodium 141   Recent Lipid Panel Lab Results  Component Value Date/Time   CHOL 154 12/25/2021 04:48 PM   TRIG 47 12/25/2021 04:48 PM   HDL 53 12/25/2021 04:48 PM   CHOLHDL 2.9 12/25/2021 04:48 PM   LDLCALC 91 12/25/2021 04:48 PM    Physical Exam:    VS:  BP (!) 146/94   Pulse 98   Ht 5\' 3"  (1.6 m)   Wt 213 lb 3.2 oz (96.7 kg)   SpO2 97%   BMI 37.77 kg/m     Wt Readings from Last 3 Encounters:  04/30/23 213 lb 3.2 oz (96.7 kg)  03/24/23 205 lb (93 kg)  02/26/23 205 lb 12.8 oz  (93.4 kg)     GEN:  Comfortable, NAD HEENT: Normal NECK: No JVD; No carotid bruits CARDIAC: RRR, no murmurs RESPIRATORY:  CTAB ABDOMEN: Soft, NTTP MUSCULOSKELETAL:  No edema; No deformity  SKIN: Warm and dry NEUROLOGIC:  Alert and oriented x 3 PSYCHIATRIC:  Normal affect    Risk Assessment/Risk Calculators:     ASSESSMENT & PLAN:    #Chronic HTN: -Currently blood pressure elevated but has been out of HCTZ; will resume as BP better controlled when on the medication -Continue amlodipine 10mg  daily -Continue coreg 12.5mg  BID -Resume HCTZ 25mg  daily -She will monitor BP at home and call us if not at goal <130/90  #OSA: -Refer to Dr. Mayford Knife for further evaluation  Patient Instructions  Medication Instructions:  Your physician recommends that you continue on your current medications as directed. Please refer to the Current Medication list given to you today.  *If you need a refill on your cardiac medications before your next appointment, please call your pharmacy*   Follow-Up: Dr Norris Cross scheduler will reach out to you to make an appointment. At [facility], you and your health needs are our priority.  As part of our continuing mission to provide you with exceptional heart care, we have created designated Provider Care Teams.  These Care Teams include your primary Cardiologist (physician) and Advanced Practice Providers (APPs -  Physician Assistants and Nurse Practitioners) who all work together to provide you with the care you need, when you need it.  We recommend signing up for the patient portal called "MyChart".  Sign up information is  provided on this After Visit Summary.  MyChart is used to connect with patients for Virtual Visits (Telemedicine).  Patients are able to view lab/test results, encounter notes, upcoming appointments, etc.  Non-urgent messages can be sent to your provider as well.   To learn more about what you can do with MyChart, go to  [email or web address].    Your next appointment:   6 month(s)  Provider:   Dr. Jacques Navy     Dispo:  No follow-ups on file.   Medication Adjustments/Labs and Tests Ordered: Current medicines are reviewed at length with the patient today.  Concerns regarding medicines are outlined above.  Tests Ordered: Orders Placed This Encounter  Procedures   Ambulatory referral to Cardiology   Medication Changes: Meds ordered this encounter  Medications   hydrochlorothiazide (HYDRODIURIL) 25 MG tablet    Sig: Take 1 tablet (25 mg total) by mouth daily.    Dispense:  90 tablet    Refill:  1

## 2023-04-30 ENCOUNTER — Encounter: Payer: Self-pay | Admitting: Cardiology

## 2023-04-30 ENCOUNTER — Ambulatory Visit: Payer: Medicaid Other | Attending: Cardiology | Admitting: Cardiology

## 2023-04-30 VITALS — BP 146/94 | HR 98 | Ht 63.0 in | Wt 213.2 lb

## 2023-04-30 DIAGNOSIS — Z8759 Personal history of other complications of pregnancy, childbirth and the puerperium: Secondary | ICD-10-CM | POA: Diagnosis not present

## 2023-04-30 DIAGNOSIS — O10919 Unspecified pre-existing hypertension complicating pregnancy, unspecified trimester: Secondary | ICD-10-CM

## 2023-04-30 DIAGNOSIS — G4733 Obstructive sleep apnea (adult) (pediatric): Secondary | ICD-10-CM

## 2023-04-30 MED ORDER — HYDROCHLOROTHIAZIDE 25 MG PO TABS: 25.0000 mg | ORAL_TABLET | Freq: Every day | ORAL | 1 refills | Status: DC

## 2023-04-30 NOTE — Patient Instructions (Signed)
Medication Instructions:  Your physician recommends that you continue on your current medications as directed. Please refer to the Current Medication list given to you today.  *If you need a refill on your cardiac medications before your next appointment, please call your pharmacy*   Follow-Up: Dr Norris Cross scheduler will reach out to you to make an appointment. At [facility], you and your health needs are our priority.  As part of our continuing mission to provide you with exceptional heart care, we have created designated Provider Care Teams.  These Care Teams include your primary Cardiologist (physician) and Advanced Practice Providers (APPs -  Physician Assistants and Nurse Practitioners) who all work together to provide you with the care you need, when you need it.  We recommend signing up for the patient portal called "MyChart".  Sign up information is provided on this After Visit Summary.  MyChart is used to connect with patients for Virtual Visits (Telemedicine).  Patients are able to view lab/test results, encounter notes, upcoming appointments, etc.  Non-urgent messages can be sent to your provider as well.   To learn more about what you can do with MyChart, go to [email or web address].    Your next appointment:   6 month(s)  Provider:   Dr. Jacques Navy

## 2023-05-01 ENCOUNTER — Ambulatory Visit: Payer: Medicaid Other | Admitting: Cardiology

## 2023-05-06 ENCOUNTER — Telehealth: Payer: Self-pay

## 2023-05-06 NOTE — Telephone Encounter (Signed)
Called and left message on voice mail per DPR asking patient to call our office to schedule new patient appointment.

## 2023-05-06 NOTE — Telephone Encounter (Signed)
Pt is scheduled new pt consult with Dr. Mayford Knife for OSA management on 10/11 at 11 am.  Pt made aware of appt date and time by [facility] sleep scheduler.
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
## 2023-05-11 ENCOUNTER — Telehealth: Payer: Self-pay | Admitting: *Deleted
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
## 2023-05-25 ENCOUNTER — Telehealth: Payer: Self-pay | Admitting: *Deleted
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
## 2023-08-28 ENCOUNTER — Encounter: Payer: Self-pay | Admitting: Cardiology

## 2023-08-28 ENCOUNTER — Telehealth: Payer: Self-pay | Admitting: *Deleted

## 2023-08-28 ENCOUNTER — Ambulatory Visit: Payer: Medicaid Other | Attending: Cardiology | Admitting: Cardiology

## 2023-08-28 VITALS — BP 134/90 | HR 101 | Ht 63.0 in | Wt 225.0 lb

## 2023-08-28 DIAGNOSIS — G4733 Obstructive sleep apnea (adult) (pediatric): Secondary | ICD-10-CM | POA: Diagnosis not present

## 2023-08-28 DIAGNOSIS — O10919 Unspecified pre-existing hypertension complicating pregnancy, unspecified trimester: Secondary | ICD-10-CM

## 2023-08-28 DIAGNOSIS — I10 Essential (primary) hypertension: Secondary | ICD-10-CM | POA: Diagnosis not present

## 2023-08-28 MED ORDER — HYDROCHLOROTHIAZIDE 25 MG PO TABS: 25.0000 mg | ORAL_TABLET | Freq: Every day | ORAL | 3 refills | Status: AC

## 2023-08-28 MED ORDER — AMLODIPINE BESYLATE 5 MG PO TABS
10.0000 mg | ORAL_TABLET | Freq: Every day | ORAL | 3 refills | Status: AC
Start: 2023-08-28 — End: ?

## 2023-08-28 MED ORDER — LABETALOL HCL 200 MG PO TABS: 200.0000 mg | ORAL_TABLET | Freq: Two times a day (BID) | ORAL | 3 refills | Status: AC

## 2023-08-28 NOTE — Telephone Encounter (Signed)
Patient agreement reviewed and signed on 08/28/2023.  WatchPAT issued to patient on 08/28/2023 by Danielle Rankin, CMA. Patient aware to not open the WatchPAT box until contacted with the activation PIN. Patient profile initialized in CloudPAT on 08/28/2023 by Hortencia Conradi. Device serial number: [id number]  Please list Reason for Call as Advice Only and type "WatchPAT issued to patient" in the comment box.

## 2023-08-28 NOTE — Patient Instructions (Signed)
Medication Instructions:  Please INCREASE your DOSE of labetalol. Take 200 mg twice daily.  *If you need a refill on your cardiac medications before your next appointment, please call your pharmacy*   Lab Work: None.  If you have labs (blood work) drawn today and your tests are completely normal, you will receive your results only by: MyChart Message (if you have MyChart) OR A paper copy in the mail If you have any lab test that is abnormal or we need to change your treatment, we will call you to review the results.   Testing/Procedures: Your physician has recommended that you have a home sleep study. This test records several body functions during sleep, including: brain activity, eye movement, oxygen and carbon dioxide blood levels, heart rate and rhythm, breathing rate and rhythm, the flow of air through your mouth and nose, snoring, body muscle movements, and chest and belly movement.    Follow-Up:   Your next appointment will be dependent on your sleep study results and it will be with:     Provider:   Dr. Armanda Magic, MD   Other Instructions Please check your blood pressure twice a day for a week. Check once around lunchtime and once around dinner or bed time. Write down your readings and call our office- all our operators are trained to take down your blood pressure readings. You can also submit them over Mychart.

## 2023-08-28 NOTE — Addendum Note (Signed)
Addended by: Luellen Pucker on: 08/28/2023 11:34 AM   Modules accepted: Orders
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
## 2023-09-07 ENCOUNTER — Telehealth: Payer: Self-pay

## 2023-09-07 NOTE — Telephone Encounter (Signed)
Call to patient to check on BP log. Patient is away from home and will submit over Mychart when she gets a chance.

## 2023-09-07 NOTE — Telephone Encounter (Signed)
-----   Message from Nurse Alcario Drought E sent at 08/28/2023  5:51 PM EDT ----- Regarding: BP log due 09/04/23 ----- Message ----- From: Luellen Pucker, RN Sent: 08/28/2023  11:34 AM EDT To: Luellen Pucker, RN  BP check due in 1 week

## 2023-09-15 NOTE — Telephone Encounter (Signed)
**Note De-Identified Eutha Cude Obfuscation** Ordering provider: Dr Mayford Knife Associated diagnoses: OSA-G47.33 WatchPAT PA obtained on 09/15/2023 by Jakaria Lavergne, Lorelle Formosa, LPN. Authorization: Per [facility] Provider Portal: CPT Code: 16109 ([place]-HST)-Notification/Prior Authorization not required for this service. Patient notified of PIN ([phone]) on 09/15/2023 Shirley Terrell Notification Method: phone.  I did request that she proceed with her HST within 2 weeks from today's date and she stated that she will. She thanked me for my call and she is aware to call us if she has any questions or concerns.  Phone note routed to covering staff for follow-up.

## 2023-10-20 NOTE — Telephone Encounter (Signed)
Call to patient to check on BP log. Patient is away from home and will submit over Mychart when she gets a chance.

## 2023-10-20 NOTE — Telephone Encounter (Signed)
Call to patient to ask if she had completed her sleep study. Patient states no one ever called her and provided PIN. Forwarded patient responses to sleep studies pool.

## 2023-10-23 NOTE — Telephone Encounter (Signed)
**Note De-Identified Andrey Mccaskill Obfuscation** No answer so I left a message on the pts VM advising her that I have sent her a [facility] message and that if she has any questions or concerns after reading it to contact us.   I did include the Pin # for her WatchPAT One Home Sleep Study device in the [facility] message.

## 2023-11-05 ENCOUNTER — Ambulatory Visit: Payer: Medicaid Other | Attending: Internal Medicine | Admitting: Internal Medicine

## 2023-11-05 ENCOUNTER — Encounter: Payer: Self-pay | Admitting: Internal Medicine

## 2023-11-05 VITALS — BP 112/70 | HR 95 | Ht 63.0 in | Wt 219.0 lb

## 2023-11-05 DIAGNOSIS — G4733 Obstructive sleep apnea (adult) (pediatric): Secondary | ICD-10-CM | POA: Diagnosis not present

## 2023-11-05 DIAGNOSIS — R9431 Abnormal electrocardiogram [ECG] [EKG]: Secondary | ICD-10-CM

## 2023-11-05 DIAGNOSIS — Z8759 Personal history of other complications of pregnancy, childbirth and the puerperium: Secondary | ICD-10-CM

## 2023-11-05 DIAGNOSIS — O10919 Unspecified pre-existing hypertension complicating pregnancy, unspecified trimester: Secondary | ICD-10-CM

## 2023-11-05 DIAGNOSIS — I10 Essential (primary) hypertension: Secondary | ICD-10-CM | POA: Diagnosis not present
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
## 2023-11-05 NOTE — Patient Instructions (Signed)
 Medication Instructions:  Your physician recommends that you continue on your current medications as directed. Please refer to the Current Medication list given to you today.  *If you need a refill on your cardiac medications before your next appointment, please call your pharmacy*  Lab Work: None  Follow-Up: At [facility], you and your health needs are our priority.  As part of our continuing mission to provide you with exceptional heart care, we have created designated Provider Care Teams.  These Care Teams include your primary Cardiologist (physician) and Advanced Practice Providers (APPs -  Physician Assistants and Nurse Practitioners) who all work together to provide you with the care you need, when you need it.  Your next appointment:   12 month(s)  Provider:   Parke Poisson, MD

## 2023-11-13 NOTE — Telephone Encounter (Signed)
Letter Sent.

## 2023-11-13 NOTE — Telephone Encounter (Signed)
Call to patient to see if she has completed sleep study, no answer. No DPR on file, left message with no identifiers asking patient to call [place] at our office #.

## 2024-03-20 ENCOUNTER — Encounter (HOSPITAL_COMMUNITY): Payer: Self-pay

## 2024-03-20 ENCOUNTER — Emergency Department (HOSPITAL_COMMUNITY)
Admission: EM | Admit: 2024-03-20 | Discharge: 2024-03-20 | Disposition: A | Discharge status: Home / Self Care | Attending: Emergency Medicine | Admitting: Emergency Medicine

## 2024-03-20 ENCOUNTER — Other Ambulatory Visit: Payer: Self-pay

## 2024-03-20 DIAGNOSIS — S0990XA Unspecified injury of head, initial encounter: Secondary | ICD-10-CM | POA: Diagnosis present

## 2024-03-20 DIAGNOSIS — Y990 Civilian activity done for income or pay: Secondary | ICD-10-CM | POA: Insufficient documentation

## 2024-03-20 DIAGNOSIS — W2209XA Striking against other stationary object, initial encounter: Secondary | ICD-10-CM | POA: Diagnosis not present

## 2024-03-20 NOTE — ED Triage Notes (Signed)
 Pt arrived reporting she hit R side of her head on dryer at work. Patient works at [employer]. Denies LOC. States she felt a little dizzy after work. Denies any other injury. No N/V or vision changes

## 2024-03-20 NOTE — ED Provider Notes (Signed)
 [place] EMERGENCY DEPARTMENT AT [facility] Provider Note   CSN: [id number] Arrival date & time: 03/20/24  1341     History  Chief Complaint  Patient presents with   Headache   Head Injury    Shirley Terrell is a 38 y.o. female.  38 year old female presents after suffering a head injury while at work.  Patient states she hit her head against a dryer door.  No loss of consciousness.  No visible injury.  Does not take any blood thinners.  No nausea or vomiting afterwards.  Went to urgent care and sent here for further evaluation.  Denies any ataxia.       Home Medications Prior to Admission medications   Medication Sig Start Date End Date Taking? Authorizing Provider  albuterol  (VENTOLIN  HFA) 108 (90 Base) MCG/ACT inhaler Inhale 2 puffs into the lungs every 6 (six) hours as needed for wheezing. 04/02/22   Newlin, Enobong, MD  amLODipine  (NORVASC ) 5 MG tablet Take 2 tablets (10 mg total) by mouth daily. 08/28/23   Jacqueline Matsu, MD  Blood Pressure Monitoring (BLOOD PRESSURE KIT) DEVI 1 Device by Does not apply route as needed. 07/01/22   Raynell Caller, MD  hydrochlorothiazide  (HYDRODIURIL ) 25 MG tablet Take 1 tablet (25 mg total) by mouth daily. 08/28/23   Jacqueline Matsu, MD  labetalol  (NORMODYNE ) 200 MG tablet Take 1 tablet (200 mg total) by mouth 2 (two) times daily. 08/28/23   Jacqueline Matsu, MD  oxyCODONE  (OXY IR/ROXICODONE ) 5 MG immediate release tablet Take 1 tablet (5 mg total) by mouth every 6 (six) hours as needed for severe pain. 03/24/23   Granville Layer, MD  Prenatal Vit-Fe Fumarate-FA (PRENATAL VITAMINS PO) Take 1 tablet by mouth daily.    [provider]      Allergies    Pork-derived products    Review of Systems   Review of Systems  All other systems reviewed and are negative.   Physical Exam Updated Vital Signs BP (!) 174/121 (BP Location: Left Arm)   Pulse 87   Temp 98.1 F (36.7 C) (Oral)   Resp 18   LMP 03/13/2024  (Approximate)   SpO2 100%  Physical Exam Vitals and nursing note reviewed.  Constitutional:      General: She is not in acute distress.    Appearance: Normal appearance. She is well-developed. She is not toxic-appearing.  HENT:     Head: Normocephalic and atraumatic.  Eyes:     General: Lids are normal.     Conjunctiva/sclera: Conjunctivae normal.     Pupils: Pupils are equal, round, and reactive to light.  Neck:     Thyroid: No thyroid mass.     Trachea: No tracheal deviation.  Cardiovascular:     Rate and Rhythm: Normal rate and regular rhythm.     Heart sounds: Normal heart sounds. No murmur heard.    No gallop.  Pulmonary:     Effort: Pulmonary effort is normal. No respiratory distress.     Breath sounds: Normal breath sounds. No stridor. No decreased breath sounds, wheezing, rhonchi or rales.  Abdominal:     General: There is no distension.     Palpations: Abdomen is soft.     Tenderness: There is no abdominal tenderness. There is no rebound.  Musculoskeletal:        General: No tenderness. Normal range of motion.     Cervical back: Normal range of motion and neck supple.  Skin:  General: Skin is warm and dry.     Findings: No abrasion or rash.  Neurological:     General: No focal deficit present.     Mental Status: She is alert and oriented to person, place, and time. Mental status is at baseline.     GCS: GCS eye subscore is 4. GCS verbal subscore is 5. GCS motor subscore is 6.     Cranial Nerves: No cranial nerve deficit.     Sensory: No sensory deficit.     Motor: Motor function is intact.     Gait: Gait is intact.  Psychiatric:        Attention and Perception: Attention normal.        Speech: Speech normal.        Behavior: Behavior normal.     ED Results / Procedures / Treatments   Labs (all labs ordered are listed, but only abnormal results are displayed) Labs Reviewed - No data to display  EKG None  Radiology No results  found.  Procedures Procedures    Medications Ordered in ED Medications - No data to display  ED Course/ Medical Decision Making/ A&P                                 Medical Decision Making  Patient here after hitting her head at work.  No visible signs of trauma.  Suspect mild concussion.  Discussed with need for imaging and she agrees to hold off at this time.  Will follow-up with her doctor and return precautions given        Final Clinical Impression(s) / ED Diagnoses Final diagnoses:  None    Rx / DC Orders ED Discharge Orders     None         Lind Repine, MD 03/20/24 1431

## 2024-03-23 ENCOUNTER — Emergency Department (HOSPITAL_COMMUNITY)

## 2024-03-23 ENCOUNTER — Emergency Department (HOSPITAL_COMMUNITY)
Admission: EM | Admit: 2024-03-23 | Discharge: 2024-03-23 | Disposition: A | Discharge status: Home / Self Care | Payer: Worker's Compensation | Attending: Emergency Medicine | Admitting: Emergency Medicine

## 2024-03-23 ENCOUNTER — Other Ambulatory Visit: Payer: Self-pay

## 2024-03-23 DIAGNOSIS — Y990 Civilian activity done for income or pay: Secondary | ICD-10-CM | POA: Insufficient documentation

## 2024-03-23 DIAGNOSIS — W208XXA Other cause of strike by thrown, projected or falling object, initial encounter: Secondary | ICD-10-CM | POA: Insufficient documentation

## 2024-03-23 DIAGNOSIS — S0990XA Unspecified injury of head, initial encounter: Secondary | ICD-10-CM | POA: Insufficient documentation

## 2024-03-23 DIAGNOSIS — Z79899 Other long term (current) drug therapy: Secondary | ICD-10-CM | POA: Insufficient documentation

## 2024-03-23 LAB — CBC WITH DIFFERENTIAL/PLATELET
Abs Immature Granulocytes: 0.05 10*3/uL (ref 0.00–0.07)
Basophils Absolute: 0.1 10*3/uL (ref 0.0–0.1)
Basophils Relative: 0 %
Eosinophils Absolute: 0.2 10*3/uL (ref 0.0–0.5)
Eosinophils Relative: 2 %
HCT: 40.9 % (ref 36.0–46.0)
Hemoglobin: 13.1 g/dL (ref 12.0–15.0)
Immature Granulocytes: 0 %
Lymphocytes Relative: 20 %
Lymphs Abs: 2.4 10*3/uL (ref 0.7–4.0)
MCH: 31.2 pg (ref 26.0–34.0)
MCHC: 32 g/dL (ref 30.0–36.0)
MCV: 97.4 fL (ref 80.0–100.0)
Monocytes Absolute: 0.8 10*3/uL (ref 0.1–1.0)
Monocytes Relative: 7 %
Neutro Abs: 8.4 10*3/uL — ABNORMAL HIGH (ref 1.7–7.7)
Neutrophils Relative %: 71 %
Platelets: 254 10*3/uL (ref 150–400)
RBC: 4.2 MIL/uL (ref 3.87–5.11)
RDW: 12 % (ref 11.5–15.5)
WBC: 12 10*3/uL — ABNORMAL HIGH (ref 4.0–10.5)
nRBC: 0 % (ref 0.0–0.2)

## 2024-03-23 LAB — URINALYSIS, ROUTINE W REFLEX MICROSCOPIC
Bacteria, UA: NONE SEEN
Bilirubin Urine: NEGATIVE
Glucose, UA: NEGATIVE mg/dL
Ketones, ur: NEGATIVE mg/dL
Nitrite: NEGATIVE
Protein, ur: NEGATIVE mg/dL
Specific Gravity, Urine: 1.029 (ref 1.005–1.030)
pH: 5 (ref 5.0–8.0)

## 2024-03-23 LAB — BASIC METABOLIC PANEL WITH GFR
Anion gap: 10 (ref 5–15)
BUN: 17 mg/dL (ref 6–20)
CO2: 26 mmol/L (ref 22–32)
Calcium: 9.2 mg/dL (ref 8.9–10.3)
Chloride: 101 mmol/L (ref 98–111)
Creatinine, Ser: 0.63 mg/dL (ref 0.44–1.00)
GFR, Estimated: >60 mL/min (ref >60)
Glucose, Bld: 96 mg/dL (ref 70–99)
Potassium: 3.6 mmol/L (ref 3.5–5.1)
Sodium: 137 mmol/L (ref 135–145)

## 2024-03-23 LAB — PREGNANCY, URINE: Preg Test, Ur: NEGATIVE

## 2024-03-23 MED ORDER — KETOROLAC TROMETHAMINE 30 MG/ML IJ SOLN
30.0000 mg | Freq: Once | INTRAMUSCULAR | Status: AC
  Administered 2024-03-23: 30 mg via INTRAMUSCULAR
  Filled 2024-03-23: qty 1

## 2024-03-23 NOTE — ED Provider Notes (Signed)
 [facility] Terrell EMERGENCY DEPARTMENT AT [facility] Provider Note   CSN: [id number] Arrival date & time: 03/23/24  1653    History  Chief Complaint  Patient presents with   Dizziness   Headache    Shirley Terrell is a 38 y.o. female here for headache.  Noted on Monday she was at work with bending down to pick up something and a "fluffy dryer" fell off the shelf and hit her on the right side of the head. Has had HA, lightheadedness, sensitivity to light, nausea.  She initially was seen here urgent having imaging performed.  She returns today as she states her Workmen's Comp. is requesting imaging due to her persistent symptoms.  No syncope, numbness, weakness, emesis.  HPI     Home Medications Prior to Admission medications   Medication Sig Start Date End Date Taking? Authorizing Provider  albuterol  (VENTOLIN  HFA) 108 (90 Base) MCG/ACT inhaler Inhale 2 puffs into the lungs every 6 (six) hours as needed for wheezing. 04/02/22   Newlin, Enobong, MD  amLODipine  (NORVASC ) 5 MG tablet Take 2 tablets (10 mg total) by mouth daily. 08/28/23   Jacqueline Matsu, MD  Blood Pressure Monitoring (BLOOD PRESSURE KIT) DEVI 1 Device by Does not apply route as needed. 07/01/22   Raynell Caller, MD  hydrochlorothiazide  (HYDRODIURIL ) 25 MG tablet Take 1 tablet (25 mg total) by mouth daily. 08/28/23   Jacqueline Matsu, MD  labetalol  (NORMODYNE ) 200 MG tablet Take 1 tablet (200 mg total) by mouth 2 (two) times daily. 08/28/23   Jacqueline Matsu, MD  oxyCODONE  (OXY IR/ROXICODONE ) 5 MG immediate release tablet Take 1 tablet (5 mg total) by mouth every 6 (six) hours as needed for severe pain. 03/24/23   Granville Layer, MD  Prenatal Vit-Fe Fumarate-FA (PRENATAL VITAMINS PO) Take 1 tablet by mouth daily.    [provider]      Allergies    Pork-derived products    Review of Systems   Review of Systems  Constitutional: Negative.   HENT: Negative.    Eyes:  Positive for visual disturbance.   Respiratory: Negative.    Cardiovascular: Negative.   Gastrointestinal:  Positive for nausea. Negative for abdominal pain and vomiting.  Genitourinary: Negative.   Musculoskeletal: Negative.   Skin: Negative.   Neurological:  Positive for headaches. Negative for dizziness, seizures, facial asymmetry, speech difficulty and weakness.  All other systems reviewed and are negative.  Physical Exam Updated Vital Signs BP (!) 147/98 (BP Location: Right Arm)   Pulse 88   Temp 98.7 F (37.1 C)   Resp 17   Ht 5\' 3"  (1.6 m)   Wt 102.5 kg   LMP 03/13/2024 (Approximate)   SpO2 98%   BMI 40.03 kg/m  Physical Exam Physical Exam  Constitutional: Pt is oriented to person, place, and time. Pt appears well-developed and well-nourished. No distress.  HENT:  Head: Normocephalic. Tenderness diffusely right side and posterior aspect head. No hematoma Mouth/Throat: Oropharynx is clear and moist.  Eyes: Conjunctivae and EOM are normal. Pupils are equal, round, and reactive to light. No scleral icterus.  No horizontal, vertical or rotational nystagmus  Neck: Normal range of motion. Neck supple.  Full active and passive ROM without pain No midline or paraspinal tenderness No nuchal rigidity or meningeal signs  Cardiovascular: Normal rate, regular rhythm and intact distal pulses.   Pulmonary/Chest: Effort normal and breath sounds normal. No respiratory distress. Pt has no wheezes. No rales.  Abdominal: Soft. Bowel  sounds are normal. There is no tenderness. There is no rebound and no guarding.  Musculoskeletal: Normal range of motion.  Lymphadenopathy:    No cervical adenopathy.  Neurological: Pt. is alert and oriented to person, place, and time. He has normal reflexes. No cranial nerve deficit.  Exhibits normal muscle tone. Coordination normal.  Mental Status:  Alert, oriented, thought content appropriate. Speech fluent without evidence of aphasia. Able to follow 2 step commands without difficulty.   Cranial Nerves:  2-12 grossly intact Motor:  Equal strength Sensory: Intact sensation Cerebellar: Neg F2N Gait: normal gait and balance CV: distal pulses palpable throughout   Skin: Skin is warm and dry. No rash noted. Pt is not diaphoretic.  Psychiatric: Pt has a normal mood and affect. Behavior is normal. Judgment and thought content normal.  Nursing note and vitals reviewed.  ED Results / Procedures / Treatments   Labs (all labs ordered are listed, but only abnormal results are displayed) Labs Reviewed  CBC WITH DIFFERENTIAL/PLATELET - Abnormal; Notable for the following components:      Result Value   WBC 12.0 (*)    Neutro Abs 8.4 (*)    All other components within normal limits  URINALYSIS, ROUTINE W REFLEX MICROSCOPIC - Abnormal; Notable for the following components:   APPearance HAZY (*)    Hgb urine dipstick SMALL (*)    Leukocytes,Ua MODERATE (*)    All other components within normal limits  BASIC METABOLIC PANEL WITH GFR  PREGNANCY, URINE    EKG None  Radiology CT HEAD WO CONTRAST ( ) Result Date: 03/23/2024 CLINICAL DATA:  Trauma EXAM: CT HEAD WITHOUT CONTRAST TECHNIQUE: Contiguous axial images were obtained from the base of the skull through the vertex without intravenous contrast. RADIATION DOSE REDUCTION: This exam was performed according to the departmental dose-optimization program which includes automated exposure control, adjustment of the mA and/or kV according to patient size and/or use of iterative reconstruction technique. COMPARISON:  Head CT 07/24/2022 FINDINGS: Brain: No evidence of acute infarction, hemorrhage, hydrocephalus, extra-axial collection or mass lesion/mass effect. Vascular: No hyperdense vessel or unexpected calcification. Skull: Normal. Negative for fracture or focal lesion. Sinuses/Orbits: No acute finding. Other: None. IMPRESSION: No acute intracranial abnormality. Electronically Signed   By: Tyron Gallon M.D.   On: 03/23/2024 18:59     Procedures Procedures    Medications Ordered in ED Medications  ketorolac  (TORADOL ) 30 MG/ML injection 30 mg (has no administration in time range)    ED Course/ Medical Decision Making/ A&P   37 oh here for evaluation of headache.  Hit her head on the right side of her head on Monday at work.  She continues to have headache, lightheadedness, light sensitivity.  She has a nonfocal neuroexam.  No obvious traumatic injury on exam.  Workup started from triage  Imaging personally viewed and interpreted:  UA moderate leuks, no UTI symptoms Labs without significant abnormality CT head without significant abnormality  Discussed results with patient, family in room.  I suspect postconcussive syndrome.  Will give Toradol .  Discussed sx management.  Referral outpatient to concussion clinic.  Will follow-up outpatient, return for any worsening symptoms.  Low suspicion for CVA, bleed, mass, demyelinating process, infectious process, acute angle glaucoma, trigeminal neuralgia, giant cell arteritis, facial fracture, meningitis, bacterial infectious process, SAH, IIH, spinal fluid leak.  The patient has been appropriately medically screened and/or stabilized in the ED. I have low suspicion for any other emergent medical condition which would require further screening, evaluation or treatment in  the ED or require inpatient management.  Patient is hemodynamically stable and in no acute distress.  Patient able to ambulate in department prior to ED.  Evaluation does not show acute pathology that would require ongoing or additional emergent interventions while in the emergency department or further inpatient treatment.  I have discussed the diagnosis with the patient and answered all questions.  Pain is been managed while in the emergency department and patient has no further complaints prior to discharge.  Patient is comfortable with plan discussed in room and is stable for discharge at this time.  I have  discussed strict return precautions for returning to the emergency department.  Patient was encouraged to follow-up with PCP/specialist refer to at discharge.                                 Medical Decision Making Amount and/or Complexity of Data Reviewed Independent Historian: friend External Data Reviewed: labs, radiology and notes. Labs: ordered. Decision-making details documented in ED Course. Radiology: ordered and independent interpretation performed. Decision-making details documented in ED Course.  Risk OTC drugs. Prescription drug management. Parenteral controlled substances. Decision regarding hospitalization. Diagnosis or treatment significantly limited by social determinants of health.          Final Clinical Impression(s) / ED Diagnoses Final diagnoses:  Injury of head, initial encounter    Rx / DC Orders ED Discharge Orders     None         Tkai Large A, PA-C 03/23/24 1949    Lind Repine, MD 03/23/24 2006

## 2024-03-23 NOTE — Discharge Instructions (Signed)
 It was a pleasure taking care of you here today.  Your workup today was reassuring.  You could possibly have postconcussive syndrome.  I have referred you to the sports medicine clinic who deals with concussive syndrome.  May take Tylenol  or Motrin  as needed at home  Follow-up outpatient, return for any worsening symptoms

## 2024-03-23 NOTE — ED Triage Notes (Signed)
 Pt was hit on her head this past Monday with a fluff dryer. Pt states that her symptoms have progressively gotten worse since then. Pt co Headaches, dizziness, and tinnitus. Sent by primary provider to have a CT scan.

## 2024-11-05 ENCOUNTER — Other Ambulatory Visit: Payer: Self-pay

## 2024-11-05 ENCOUNTER — Emergency Department (HOSPITAL_COMMUNITY)

## 2024-11-05 ENCOUNTER — Emergency Department (HOSPITAL_COMMUNITY)
Admission: EM | Admit: 2024-11-05 | Discharge: 2024-11-05 | Disposition: A | Discharge status: Home / Self Care | Attending: Emergency Medicine | Admitting: Emergency Medicine

## 2024-11-05 DIAGNOSIS — R519 Headache, unspecified: Secondary | ICD-10-CM | POA: Diagnosis present

## 2024-11-05 DIAGNOSIS — H53149 Visual discomfort, unspecified: Secondary | ICD-10-CM | POA: Insufficient documentation

## 2024-11-05 DIAGNOSIS — J45909 Unspecified asthma, uncomplicated: Secondary | ICD-10-CM | POA: Diagnosis not present

## 2024-11-05 DIAGNOSIS — G8929 Other chronic pain: Secondary | ICD-10-CM | POA: Diagnosis not present

## 2024-11-05 DIAGNOSIS — Z79899 Other long term (current) drug therapy: Secondary | ICD-10-CM | POA: Insufficient documentation

## 2024-11-05 DIAGNOSIS — R202 Paresthesia of skin: Secondary | ICD-10-CM | POA: Diagnosis not present

## 2024-11-05 DIAGNOSIS — I10 Essential (primary) hypertension: Secondary | ICD-10-CM | POA: Insufficient documentation

## 2024-11-05 DIAGNOSIS — R079 Chest pain, unspecified: Secondary | ICD-10-CM | POA: Diagnosis not present

## 2024-11-05 LAB — BASIC METABOLIC PANEL WITH GFR
Anion gap: 8 (ref 5–15)
BUN: 10 mg/dL (ref 6–20)
CO2: 26 mmol/L (ref 22–32)
Calcium: 9.3 mg/dL (ref 8.9–10.3)
Chloride: 106 mmol/L (ref 98–111)
Creatinine, Ser: 0.61 mg/dL (ref 0.44–1.00)
GFR, Estimated: >60 mL/min
Glucose, Bld: 93 mg/dL (ref 70–99)
Potassium: 3.5 mmol/L (ref 3.5–5.1)
Sodium: 140 mmol/L (ref 135–145)

## 2024-11-05 LAB — CBC
HCT: 40.9 % (ref 36.0–46.0)
Hemoglobin: 13.6 g/dL (ref 12.0–15.0)
MCH: 31.3 pg (ref 26.0–34.0)
MCHC: 33.3 g/dL (ref 30.0–36.0)
MCV: 94 fL (ref 80.0–100.0)
Platelets: 262 K/uL (ref 150–400)
RBC: 4.35 MIL/uL (ref 3.87–5.11)
RDW: 12.1 % (ref 11.5–15.5)
WBC: 9.8 K/uL (ref 4.0–10.5)
nRBC: 0 % (ref 0.0–0.2)

## 2024-11-05 LAB — TROPONIN T, HIGH SENSITIVITY: Troponin T High Sensitivity: <15 ng/L (ref 0–19)

## 2024-11-05 LAB — HCG, SERUM, QUALITATIVE: Preg, Serum: NEGATIVE

## 2024-11-05 MED ORDER — METOCLOPRAMIDE HCL 5 MG/ML IJ SOLN
10.0000 mg | Freq: Once | INTRAMUSCULAR | Status: AC
  Administered 2024-11-05: 10 mg via INTRAVENOUS
  Filled 2024-11-05: qty 2

## 2024-11-05 MED ORDER — KETOROLAC TROMETHAMINE 15 MG/ML IJ SOLN
15.0000 mg | Freq: Once | INTRAMUSCULAR | Status: AC
  Administered 2024-11-05: 15 mg via INTRAVENOUS
  Filled 2024-11-05: qty 1

## 2024-11-05 MED ORDER — DEXAMETHASONE SOD PHOSPHATE PF 10 MG/ML IJ SOLN
10.0000 mg | Freq: Once | INTRAMUSCULAR | Status: AC
  Administered 2024-11-05: 10 mg via INTRAVENOUS

## 2024-11-05 MED ORDER — HYDROCHLOROTHIAZIDE 12.5 MG PO TABS
12.5000 mg | ORAL_TABLET | Freq: Once | ORAL | Status: AC
  Administered 2024-11-05: 12.5 mg via ORAL
  Filled 2024-11-05: qty 1

## 2024-11-05 MED ORDER — MAGNESIUM SULFATE IN D5W 1-5 GM/100ML-% IV SOLN
1.0000 g | Freq: Once | INTRAVENOUS | Status: AC
  Administered 2024-11-05: 1 g via INTRAVENOUS
  Filled 2024-11-05 (×2): qty 100

## 2024-11-05 MED ORDER — AMLODIPINE BESYLATE 5 MG PO TABS
5.0000 mg | ORAL_TABLET | Freq: Once | ORAL | Status: AC
  Administered 2024-11-05: 5 mg via ORAL
  Filled 2024-11-05: qty 1

## 2024-11-05 MED ORDER — SODIUM CHLORIDE 0.9 % IV BOLUS
1000.0000 mL | Freq: Once | INTRAVENOUS | Status: AC
  Administered 2024-11-05: 1000 mL via INTRAVENOUS

## 2024-11-05 MED ORDER — ACETAMINOPHEN 500 MG PO TABS
1000.0000 mg | ORAL_TABLET | Freq: Once | ORAL | Status: AC
  Administered 2024-11-05: 1000 mg via ORAL
  Filled 2024-11-05: qty 2

## 2024-11-05 MED ORDER — DIPHENHYDRAMINE HCL 50 MG/ML IJ SOLN
12.5000 mg | Freq: Once | INTRAMUSCULAR | Status: AC
  Administered 2024-11-05: 12.5 mg via INTRAVENOUS
  Filled 2024-11-05: qty 1

## 2024-11-05 NOTE — Discharge Instructions (Signed)
 You were evaluated emergency room for headache, elevated blood pressure and chest pain.  Your lab work and imaging did not show any significant abnormality.  Please resume your blood pressure medication as prescribed.  You are provided a referral for neurology as well.  If you are unable to get into see neurology within the next week or 2 please follow-up with your primary care doctor.

## 2024-11-05 NOTE — ED Notes (Signed)
 Second troponin not needed per PA.

## 2024-11-05 NOTE — Progress Notes (Signed)
 "  [facility]  Urgent Care Provider Note   Provider at bedside: 10:34 AM  History obtained from the: Patient  HISTORY   PATIENT ID: Shirley Terrell is a 38 y.o. female.  CHIEF COMPLAINT: Chief Complaint  Patient presents with   Headache    Pt states that she has been experiencing a headache, dizziness and numbness in her right arm.      ALLERGIES: Allergies[1]   PAST MEDICAL HISTORY: PMH - Hypertension  CURRENT MEDICATIONS: Current Medications[2]  ROS  All other symptoms are reviewed and are negative except those listed in HPI   HPI   Shirley Terrell is a 38 y.o. female  presents to Urgent care   History of Present Illness Patient with hypertension presents with elevated BP.  Hypertension - Attributed to amlodipine , hydrochlorothiazide , and labetalol  - Initially prescribed hydrochlorothiazide  12.5 mg, increased to 25 mg due to ineffectiveness - Resulted in increased energy levels - Noticed changes after 3 weeks - On combination therapy for about a year - Receives medications via mail from [employer] or [employer] - Takes medications as prescribed upon waking  Headache - Located at the occiput - Has not taken OTC pain relievers like Motrin  or Tylenol  due to past ineffectiveness  Chest Pain - Resembling indigestion  Right Arm Pain - Began experiencing last night - Progressively worsening - Now described as tightening sensation  PHYSICAL EXAM   Vitals:   11/05/24 1027  BP: (!) 180/112  BP Location: Left arm  Patient Position: Sitting  Pulse: 87  Resp: 17  Temp: 98.2 F (36.8 C)  TempSrc: Oral  SpO2: 97%  Weight: 102 kg (225 lb 8 oz)  Height: 1.6 m (5' 3)     Physical Exam Vitals and nursing note reviewed.  Constitutional:      General: She is not in acute distress.    Appearance: Normal appearance. She is not ill-appearing, toxic-appearing or diaphoretic.  HENT:     Head: Normocephalic and atraumatic.      Right Ear: External ear normal.     Left Ear: External ear normal.     Nose: Nose normal.     Mouth/Throat:     Mouth: Mucous membranes are moist.  Cardiovascular:     Rate and Rhythm: Normal rate and regular rhythm.     Heart sounds: No murmur heard. Pulmonary:     Effort: Pulmonary effort is normal. No respiratory distress.  Abdominal:     General: Bowel sounds are normal. There is no distension.     Palpations: Abdomen is soft.     Tenderness: There is no abdominal tenderness. There is no right CVA tenderness or left CVA tenderness.  Musculoskeletal:     Cervical back: Neck supple.  Skin:    General: Skin is warm and dry.     Capillary Refill: Capillary refill takes less than 2 seconds.  Neurological:     General: No focal deficit present.     Mental Status: She is alert and oriented to person, place, and time.  Psychiatric:        Mood and Affect: Mood normal.        RESULTS  No results found for this visit on 11/05/24.   ASSESSMENT/PLAN/MDM   1. Hypertension, unspecified type   2. Right arm pain   3. Abnormal EKG      Shirley Terrell is a 38 y.o. female  presents to Urgent care for dizziness, headache and right arm pain that  started last night, off blood pressure medications x 2 months.  On exam patient appears uncomfortable, nontoxic.  Vital signs show blood pressure 180/112, no tachycardia.  Oxygen saturations at 97% on room air.  No tachypnea or respiratory distress noted.  Twelve-lead EKG shows I discussed with patient concern for right arm increasing pain with elevated blood pressure and headache and encouraged her to go to the emergency room now for further evaluation.  Patient has family member with her who will drive her to the ER [place].  Patient is stable for private transport      UC DISPOSITION   Follow up with PCP  Patient Instructions  Go to emergency room now your EKG showed evidence that your heart may not be getting enough oxygen that with  your uncontrolled blood pressure and right arm pain are very concerning for heart attack.   Hand out provided, I discussed the findings today, diagnosis/differential diagnosis, plan and red flags that require return for reevaluation with PCP,  Urgent care or EMERGENCY. Patient/representitive was agreeable to outlined plan. Questions were answered and patient is stable for discharge.  Provider time spent in patient care today, inclusive of but not limited to clinical reassessment, review of diagnostic studies, and discharge preparation, was less than 30 minutes.  This document was created using the aid of voice recognition Scientist, clinical (histocompatibility and immunogenetics).  Electrically signed by Channing Sero ENP-C MSN at 11:16 AM        [1] Allergies Allergen Reactions   Pork/Porcine Containing Products     Pt unable to eat  [2]   amLODIPine  (NORVASC ) 5 mg tablet   amLODIPine -valsartan-hydroCHLOROthiazide  10-320-25 mg tab   hydroCHLOROthiazide  (HYDRODIURIL ) 25 mg tablet   labetalol  (NORMODYNE ) 100 mg tablet "

## 2024-11-05 NOTE — ED Provider Notes (Signed)
 " [place] EMERGENCY DEPARTMENT AT [facility] Provider Note   CSN: [id number] Arrival date & time: 11/05/24  1130     Patient presents with: Chest Pain   Shirley Terrell is a 38 y.o. female history of hypertension presents with complaints of persistent headache over the past week with associated right upper extremity numbness and tingling in addition to chest pain.  Headache was not sudden or maximal in onset.  Typically does not get headaches.  Endorses photophobia and floaters.  No difficulty speaking or ambulating.  Chest pain is intermittent and nonexertional.  No cardiac history.  No history of PE.  No cough or URI symptoms.  Does admit to noncompliance with her blood pressure medication over the past month.  Notes that she is supposed to be taking amlodipine , hydrochlorothiazide  and labetalol .  Has not taken any this month because the pills looked different.    Chest Pain     Past Medical History:  Diagnosis Date   Anemia    takes iron  supplement   Asthma    prn inhaler   Hypertension    states BP normalized while pregnant and since delivery; is currently not on med.   Lisfranc dislocation 11/30/2016   left   Pregnancy induced hypertension    with 2nd preg   Sleep apnea    uses cpap   Past Surgical History:  Procedure Laterality Date   CHOLECYSTECTOMY  04/13/2012   Procedure: LAPAROSCOPIC CHOLECYSTECTOMY WITH INTRAOPERATIVE CHOLANGIOGRAM;  Surgeon: Krystal JINNY Russell, MD;  Location: [facility] OR;  Service: General;  Laterality: N/A;  laparoscopic cholecystectomy with IOC   INCISIONAL HERNIA REPAIR  08/13/2018   LAPROSCOPIC   INCISIONAL HERNIA REPAIR N/A 08/13/2018   Procedure: LAPAROSCOPIC INCISIONAL HERNIA REPAIR;  Surgeon: Signe Mitzie LABOR, MD;  Location: [facility] OR;  Service: General;  Laterality: N/A;   INSERTION OF MESH N/A 08/13/2018   Procedure: INSERTION OF MESH;  Surgeon: Signe Mitzie LABOR, MD;  Location: [facility] OR;  Service: General;  Laterality: N/A;    LAPAROSCOPIC BILATERAL SALPINGECTOMY N/A 03/24/2023   Procedure: LAPAROSCOPIC BILATERAL SALPINGECTOMY;  Surgeon: Fredirick Glenys RAMAN, MD;  Location: [facility] OR;  Service: Gynecology;  Laterality: N/A;   OPEN REDUCTION INTERNAL FIXATION (ORIF) FOOT LISFRANC FRACTURE Left 12/18/2016   Procedure: OPEN REDUCTION INTERNAL FIXATION (ORIF) FOOT LISFRANC FRACTURE;  Surgeon: Norleen Armor, MD;  Location: [facility];  Service: Orthopedics;  Laterality: Left;     Prior to Admission medications  Medication Sig Start Date End Date Taking? Authorizing Provider  albuterol  (VENTOLIN  HFA) 108 (90 Base) MCG/ACT inhaler Inhale 2 puffs into the lungs every 6 (six) hours as needed for wheezing. 04/02/22   Newlin, Enobong, MD  amLODipine  (NORVASC ) 5 MG tablet Take 2 tablets (10 mg total) by mouth daily. 08/28/23   Shlomo Wilbert SAUNDERS, MD  Blood Pressure Monitoring (BLOOD PRESSURE KIT) DEVI 1 Device by Does not apply route as needed. 07/01/22   Izell Harari, MD  hydrochlorothiazide  (HYDRODIURIL ) 25 MG tablet Take 1 tablet (25 mg total) by mouth daily. 08/28/23   Shlomo Wilbert SAUNDERS, MD  labetalol  (NORMODYNE ) 200 MG tablet Take 1 tablet (200 mg total) by mouth 2 (two) times daily. 08/28/23   Shlomo Wilbert SAUNDERS, MD  oxyCODONE  (OXY IR/ROXICODONE ) 5 MG immediate release tablet Take 1 tablet (5 mg total) by mouth every 6 (six) hours as needed for severe pain. 03/24/23   Fredirick Glenys RAMAN, MD  Prenatal Vit-Fe Fumarate-FA (PRENATAL VITAMINS PO) Take 1 tablet by mouth daily.  [provider]    Allergies: Porcine (pork) protein-containing drug products    Review of Systems  Cardiovascular:  Positive for chest pain.    Updated Vital Signs BP (!) 169/115   Pulse 85   Temp 98.2 F (36.8 C) (Oral)   Resp 18   LMP 09/30/2024   SpO2 96%   Physical Exam Vitals and nursing note reviewed.  Constitutional:      General: She is not in acute distress.    Appearance: She is well-developed.  HENT:     Head: Normocephalic  and atraumatic.  Eyes:     Conjunctiva/sclera: Conjunctivae normal.  Cardiovascular:     Rate and Rhythm: Normal rate and regular rhythm.     Heart sounds: No murmur heard. Pulmonary:     Effort: Pulmonary effort is normal. No respiratory distress.     Breath sounds: Normal breath sounds.  Abdominal:     Palpations: Abdomen is soft.     Tenderness: There is no abdominal tenderness.  Musculoskeletal:        General: No swelling.     Cervical back: Neck supple.  Skin:    General: Skin is warm and dry.     Capillary Refill: Capillary refill takes less than 2 seconds.  Neurological:     Mental Status: She is alert.     Comments: Patient is alert and oriented. There is no abnormal phonation. Symmetric smile without facial droop.  Moves all extremities spontaneously. 5/5 strength in upper and lower extremities. . No sensation deficit. There is no nystagmus. EOMI, PERRL. Coordination intact with finger to nose    Psychiatric:        Mood and Affect: Mood normal.     (all labs ordered are listed, but only abnormal results are displayed) Labs Reviewed  BASIC METABOLIC PANEL WITH GFR  CBC  HCG, SERUM, QUALITATIVE  TROPONIN T, HIGH SENSITIVITY  TROPONIN T, HIGH SENSITIVITY    EKG: None  Radiology: CT Head Wo Contrast Result Date: 11/05/2024 EXAM: CT HEAD WITHOUT CONTRAST 11/05/2024 12:44:55 PM TECHNIQUE: CT of the head was performed without the administration of intravenous contrast. Automated exposure control, iterative reconstruction, and/or weight based adjustment of the mA/kV was utilized to reduce the radiation dose to as low as reasonably achievable. COMPARISON: 03/23/2024 CLINICAL HISTORY: Headache, neuro deficit FINDINGS: BRAIN AND VENTRICLES: No acute hemorrhage. No evidence of acute infarct. No hydrocephalus. No extra-axial collection. No mass effect or midline shift. ORBITS: No acute abnormality. SINUSES: No acute abnormality. SOFT TISSUES AND SKULL: No acute soft tissue  abnormality. No skull fracture. IMPRESSION: 1. No acute intracranial abnormality. Comparison made to 03/23/2024. Electronically signed by: Evalene Coho MD 11/05/2024 12:46 PM EST RP Workstation: [id number]   DG Chest 2 View Result Date: 11/05/2024 CLINICAL DATA:  Chest pain. EXAM: CHEST - 2 VIEW COMPARISON:  None Available. FINDINGS: No focal consolidation, pleural effusion or pneumothorax. Mild cardiomegaly. No acute osseous pathology. IMPRESSION: 1. No active cardiopulmonary disease. 2. Mild cardiomegaly. Electronically Signed   By: Vanetta Chou M.D.   On: 11/05/2024 12:20     Procedures   Medications Ordered in the ED  magnesium  sulfate IVPB 1 g 100 mL (has no administration in time range)  sodium chloride  0.9 % bolus 1,000 mL (0 mLs Intravenous Stopped 11/05/24 1501)  metoCLOPramide  (REGLAN ) injection 10 mg (10 mg Intravenous Given 11/05/24 1310)  diphenhydrAMINE  (BENADRYL ) injection 12.5 mg (12.5 mg Intravenous Given 11/05/24 1312)  acetaminophen  (TYLENOL ) tablet 1,000 mg (1,000 mg Oral Given 11/05/24  1253)  ketorolac  (TORADOL ) 15 MG/ML injection 15 mg (15 mg Intravenous Given 11/05/24 1410)  amLODipine  (NORVASC ) tablet 5 mg (5 mg Oral Given 11/05/24 1411)  hydrochlorothiazide  (HYDRODIURIL ) tablet 12.5 mg (12.5 mg Oral Given 11/05/24 1411)  dexamethasone  (DECADRON ) injection 10 mg (10 mg Intravenous Given 11/05/24 1513)    Clinical Course as of 11/05/24 1611  Sat Nov 05, 2024  1402 Patient evaluated for persistent headache over the past week with associated upper extremity numbness and tingling in addition to nonexertional chest pain.  Upon arrival patient is hypertensive.  Admits that she has not been taking her blood pressure medication over the past month.  She is otherwise hemodynamically stable and nontoxic-appearing.  She does endorse sensation deficits over the dorsum of her right upper extremity, has no gross weakness and no other focal deficit.  Will obtain cardiac  workup, CT head and treat with migraine cocktail. [JT]  1427 EKG 12-Lead Normal sinus rhythm, appears unchanged [JT]  1428 DG Chest 2 View No cardiopulmonary disease [JT]  1428 CT Head Wo Contrast No acute process [JT]  1428 Lab work is overall unremarkable [JT]  1428 Patient reevaluated, reports persistent headache.  Will treat her hypertension, provide a dose of Decadron , magnesium  ,Toradol  and reevaluate. [JT]  1606 Patient reevaluated.  She reports that her headache has somewhat improved.  Appears that her magnesium  was not given.  At this point discussed either pursuing admission versus discharge home with resuming her antihypertensives, close PCP follow-up and referral for neurology.  She is ambulatory and tolerating p.o.  Using shared decision making agreed to discharge home. [JT]    Clinical Course User Index [JT] Donnajean Lynwood DEL, PA-C                                 Medical Decision Making Amount and/or Complexity of Data Reviewed Labs: ordered. Radiology: ordered.   This patient presents to the ED with chief complaint(s) of headache .  The complaint involves an extensive differential diagnosis and also carries with it a high risk of complications and morbidity.   Pertinent past medical history as listed in HPI  The differential diagnosis includes  Based off exam and history do not suspect CVA, TIA.  No evidence of intracranial hemorrhage or mass on CT.  Do not suspect ACS as patient is without any EKG changes or elevated troponin.  She is PERC negative.  Do not suspect PE.  No suspicion for aortic dissection. Additional history obtained: Records reviewed Care Everywhere/External Records  Disposition:   Patient will be discharged home. The patient has been appropriately medically screened and/or stabilized in the ED. I have low suspicion for any other emergent medical condition which would require further screening, evaluation or treatment in the ED or require inpatient  management. At time of discharge the patient is hemodynamically stable and in no acute distress. I have discussed work-up results and diagnosis with patient and answered all questions. Patient is agreeable with discharge plan. We discussed strict return precautions for returning to the emergency department and they verbalized understanding.     Social Determinants of Health:   none  This note was dictated with voice recognition software.  Despite best efforts at proofreading, errors may have occurred which can change the documentation meaning.       Final diagnoses:  Chronic nonintractable headache, unspecified headache type    ED Discharge Orders  Ordered    Ambulatory referral to Neurology       Comments: An appointment is requested in approximately: 1 week   11/05/24 1611               Donnajean Lynwood DEL, PA-C 11/05/24 1611  "

## 2024-11-05 NOTE — ED Triage Notes (Signed)
 [facility] from [facility], states the Dr. Sherrian she might be having a heart attack. EKG shows T wave abnormality. Pt has c/o indigestion, right arm pain and tingling in her fingers. Pt has also been off of her BP med for a month.

## 2024-11-05 NOTE — ED Notes (Signed)
 Medication delay due to waiting for pharmacy

## 2024-11-08 ENCOUNTER — Encounter: Payer: Self-pay | Admitting: Neurology

## 2025-02-27 ENCOUNTER — Ambulatory Visit: Payer: Self-pay | Admitting: Neurology
